# Patient Record
Sex: Male | Born: 1935 | Race: White | Hispanic: No | Marital: Married
Health system: Southern US, Community
[De-identification: ages and names within clinical notes are randomized; demographics above are authoritative.]

## PROBLEM LIST (undated history)

## (undated) DIAGNOSIS — I4891 Unspecified atrial fibrillation: Secondary | ICD-10-CM

## (undated) DIAGNOSIS — S2249XA Multiple fractures of ribs, unspecified side, initial encounter for closed fracture: Secondary | ICD-10-CM

## (undated) DIAGNOSIS — R011 Cardiac murmur, unspecified: Secondary | ICD-10-CM

## (undated) DIAGNOSIS — IMO0001 Reserved for inherently not codable concepts without codable children: Secondary | ICD-10-CM

## (undated) DIAGNOSIS — K219 Gastro-esophageal reflux disease without esophagitis: Secondary | ICD-10-CM

## (undated) DIAGNOSIS — R51 Headache: Secondary | ICD-10-CM

## (undated) DIAGNOSIS — K922 Gastrointestinal hemorrhage, unspecified: Secondary | ICD-10-CM

## (undated) DIAGNOSIS — D649 Anemia, unspecified: Secondary | ICD-10-CM

## (undated) DIAGNOSIS — S2239XA Fracture of one rib, unspecified side, initial encounter for closed fracture: Secondary | ICD-10-CM

## (undated) DIAGNOSIS — E7800 Pure hypercholesterolemia, unspecified: Secondary | ICD-10-CM

## (undated) DIAGNOSIS — I219 Acute myocardial infarction, unspecified: Secondary | ICD-10-CM

## (undated) DIAGNOSIS — I209 Angina pectoris, unspecified: Secondary | ICD-10-CM

## (undated) DIAGNOSIS — I2510 Atherosclerotic heart disease of native coronary artery without angina pectoris: Secondary | ICD-10-CM

## (undated) DIAGNOSIS — J189 Pneumonia, unspecified organism: Secondary | ICD-10-CM

## (undated) DIAGNOSIS — I5189 Other ill-defined heart diseases: Secondary | ICD-10-CM

## (undated) DIAGNOSIS — N189 Chronic kidney disease, unspecified: Secondary | ICD-10-CM

## (undated) DIAGNOSIS — Z5189 Encounter for other specified aftercare: Secondary | ICD-10-CM

## (undated) DIAGNOSIS — K274 Chronic or unspecified peptic ulcer, site unspecified, with hemorrhage: Secondary | ICD-10-CM

## (undated) DIAGNOSIS — I499 Cardiac arrhythmia, unspecified: Secondary | ICD-10-CM

## (undated) DIAGNOSIS — S329XXA Fracture of unspecified parts of lumbosacral spine and pelvis, initial encounter for closed fracture: Secondary | ICD-10-CM

## (undated) DIAGNOSIS — E039 Hypothyroidism, unspecified: Secondary | ICD-10-CM

## (undated) DIAGNOSIS — Z889 Allergy status to unspecified drugs, medicaments and biological substances status: Secondary | ICD-10-CM

## (undated) DIAGNOSIS — M1990 Unspecified osteoarthritis, unspecified site: Secondary | ICD-10-CM

## (undated) DIAGNOSIS — I739 Peripheral vascular disease, unspecified: Secondary | ICD-10-CM

## (undated) DIAGNOSIS — I10 Essential (primary) hypertension: Secondary | ICD-10-CM

## (undated) HISTORY — DX: Unspecified atrial fibrillation: I48.91

## (undated) HISTORY — PX: HERNIA REPAIR: SHX51

## (undated) HISTORY — DX: Chronic or unspecified peptic ulcer, site unspecified, with hemorrhage: K27.4

## (undated) HISTORY — DX: Gastrointestinal hemorrhage, unspecified: K92.2

## (undated) HISTORY — PX: EYE SURGERY: SHX253

## (undated) HISTORY — DX: Other ill-defined heart diseases: I51.89

## (undated) HISTORY — PX: ROTATOR CUFF REPAIR: SHX139

## (undated) HISTORY — PX: KNEE SURGERY: SHX244

---

## 2003-04-04 ENCOUNTER — Encounter: Payer: Self-pay | Admitting: Family Medicine

## 2003-04-04 ENCOUNTER — Encounter: Admission: RE | Admit: 2003-04-04 | Discharge: 2003-04-04 | Payer: Self-pay | Admitting: Family Medicine

## 2003-08-11 HISTORY — PX: CORONARY ARTERY BYPASS GRAFT: SHX141

## 2003-08-13 ENCOUNTER — Inpatient Hospital Stay (HOSPITAL_COMMUNITY): Admission: EM | Admit: 2003-08-13 | Discharge: 2003-08-21 | Payer: Self-pay | Admitting: Emergency Medicine

## 2003-09-10 ENCOUNTER — Encounter (HOSPITAL_COMMUNITY): Admission: RE | Admit: 2003-09-10 | Discharge: 2003-12-09 | Payer: Self-pay | Admitting: Cardiovascular Disease

## 2003-12-10 ENCOUNTER — Encounter (HOSPITAL_COMMUNITY): Admission: RE | Admit: 2003-12-10 | Discharge: 2003-12-11 | Payer: Self-pay | Admitting: Cardiovascular Disease

## 2005-01-14 ENCOUNTER — Ambulatory Visit (HOSPITAL_COMMUNITY): Admission: RE | Admit: 2005-01-14 | Discharge: 2005-01-14 | Payer: Self-pay | Admitting: Orthopaedic Surgery

## 2005-02-02 ENCOUNTER — Ambulatory Visit (HOSPITAL_COMMUNITY): Admission: RE | Admit: 2005-02-02 | Discharge: 2005-02-02 | Payer: Self-pay | Admitting: Orthopaedic Surgery

## 2005-08-20 ENCOUNTER — Ambulatory Visit (HOSPITAL_COMMUNITY): Admission: RE | Admit: 2005-08-20 | Discharge: 2005-08-20 | Payer: Self-pay | Admitting: Ophthalmology

## 2005-08-31 ENCOUNTER — Ambulatory Visit (HOSPITAL_COMMUNITY): Admission: RE | Admit: 2005-08-31 | Discharge: 2005-08-31 | Payer: Self-pay | Admitting: Ophthalmology

## 2005-09-08 ENCOUNTER — Emergency Department (HOSPITAL_COMMUNITY): Admission: EM | Admit: 2005-09-08 | Discharge: 2005-09-09 | Payer: Self-pay | Admitting: Emergency Medicine

## 2006-08-10 HISTORY — PX: OTHER SURGICAL HISTORY: SHX169

## 2007-01-17 ENCOUNTER — Ambulatory Visit: Payer: Self-pay | Admitting: Cardiovascular Disease

## 2007-01-17 ENCOUNTER — Inpatient Hospital Stay (HOSPITAL_COMMUNITY): Admission: EM | Admit: 2007-01-17 | Discharge: 2007-02-01 | Payer: Self-pay | Admitting: Emergency Medicine

## 2007-01-19 ENCOUNTER — Encounter: Payer: Self-pay | Admitting: Cardiovascular Disease

## 2007-01-20 ENCOUNTER — Ambulatory Visit: Payer: Self-pay | Admitting: Surgery

## 2007-01-24 HISTORY — PX: CARDIAC CATHETERIZATION: SHX172

## 2007-01-27 ENCOUNTER — Encounter: Payer: Self-pay | Admitting: Surgery

## 2007-02-15 ENCOUNTER — Ambulatory Visit: Payer: Self-pay | Admitting: Surgery

## 2007-02-17 ENCOUNTER — Encounter (HOSPITAL_COMMUNITY): Admission: RE | Admit: 2007-02-17 | Discharge: 2007-05-18 | Payer: Self-pay | Admitting: Cardiovascular Disease

## 2008-04-02 IMAGING — CR DG CHEST 1V PORT
1 series · 1 of 1 positions shown · non-contrast
Comparison: 01/27/07.

CLINICAL DATA: CABG.
 PORTABLE CHEST - 1 VIEW:

[view not recorded]
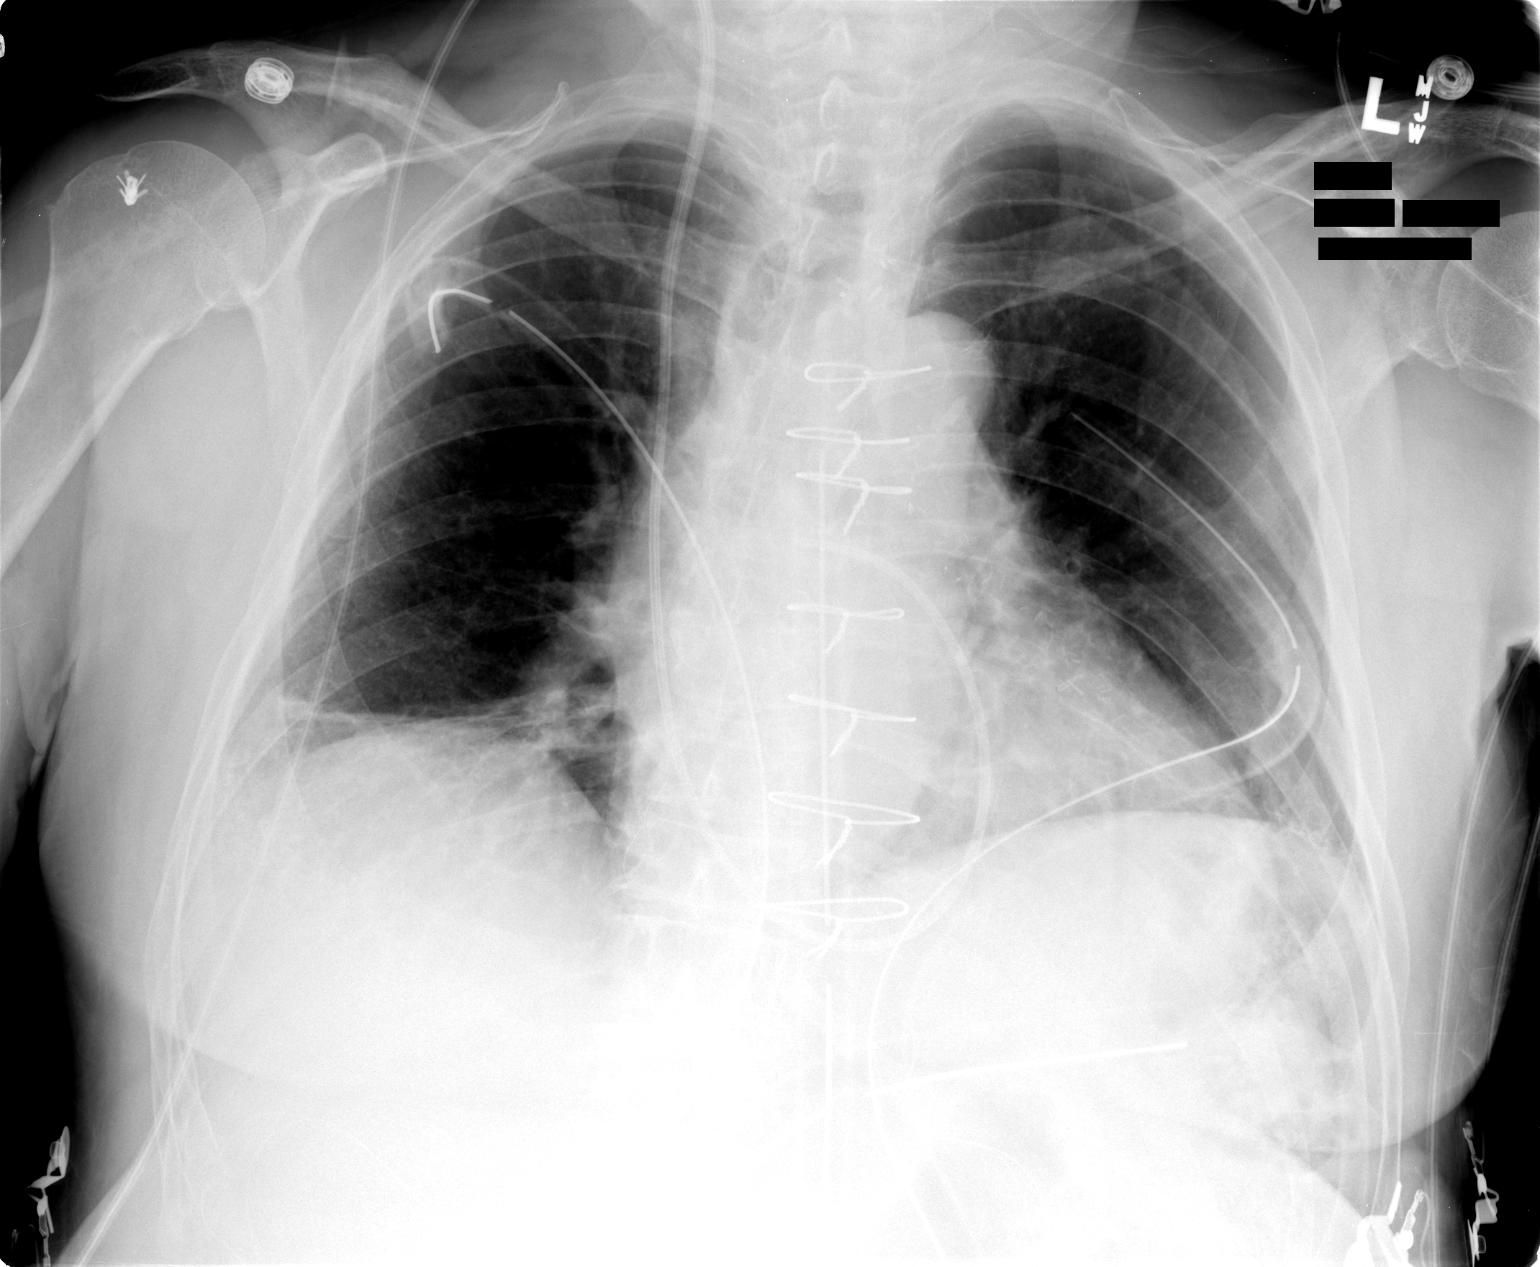

[1 of 1 positions shown; findings below may reference images not displayed]

FINDINGS: The patient has been extubated. Right IJ and Swan-Ganz catheter is in the right pulmonary artery. Bilateral chest tubes and a mediastinal drain remain in place. Heart size stable. Lungs are low in volume with bibasilar atelectasis.
IMPRESSION: Bibasilar atelectasis.

## 2008-04-03 IMAGING — CR DG CHEST 1V PORT
1 series · 1 of 1 positions shown · non-contrast
Comparison: Portable chest x-rays yesterday and 01/27/2007.

CLINICAL DATA: Postop CABG.

PORTABLE CHEST - 1 VIEW  [DATE]/9220 2512 hours:

[view not recorded]
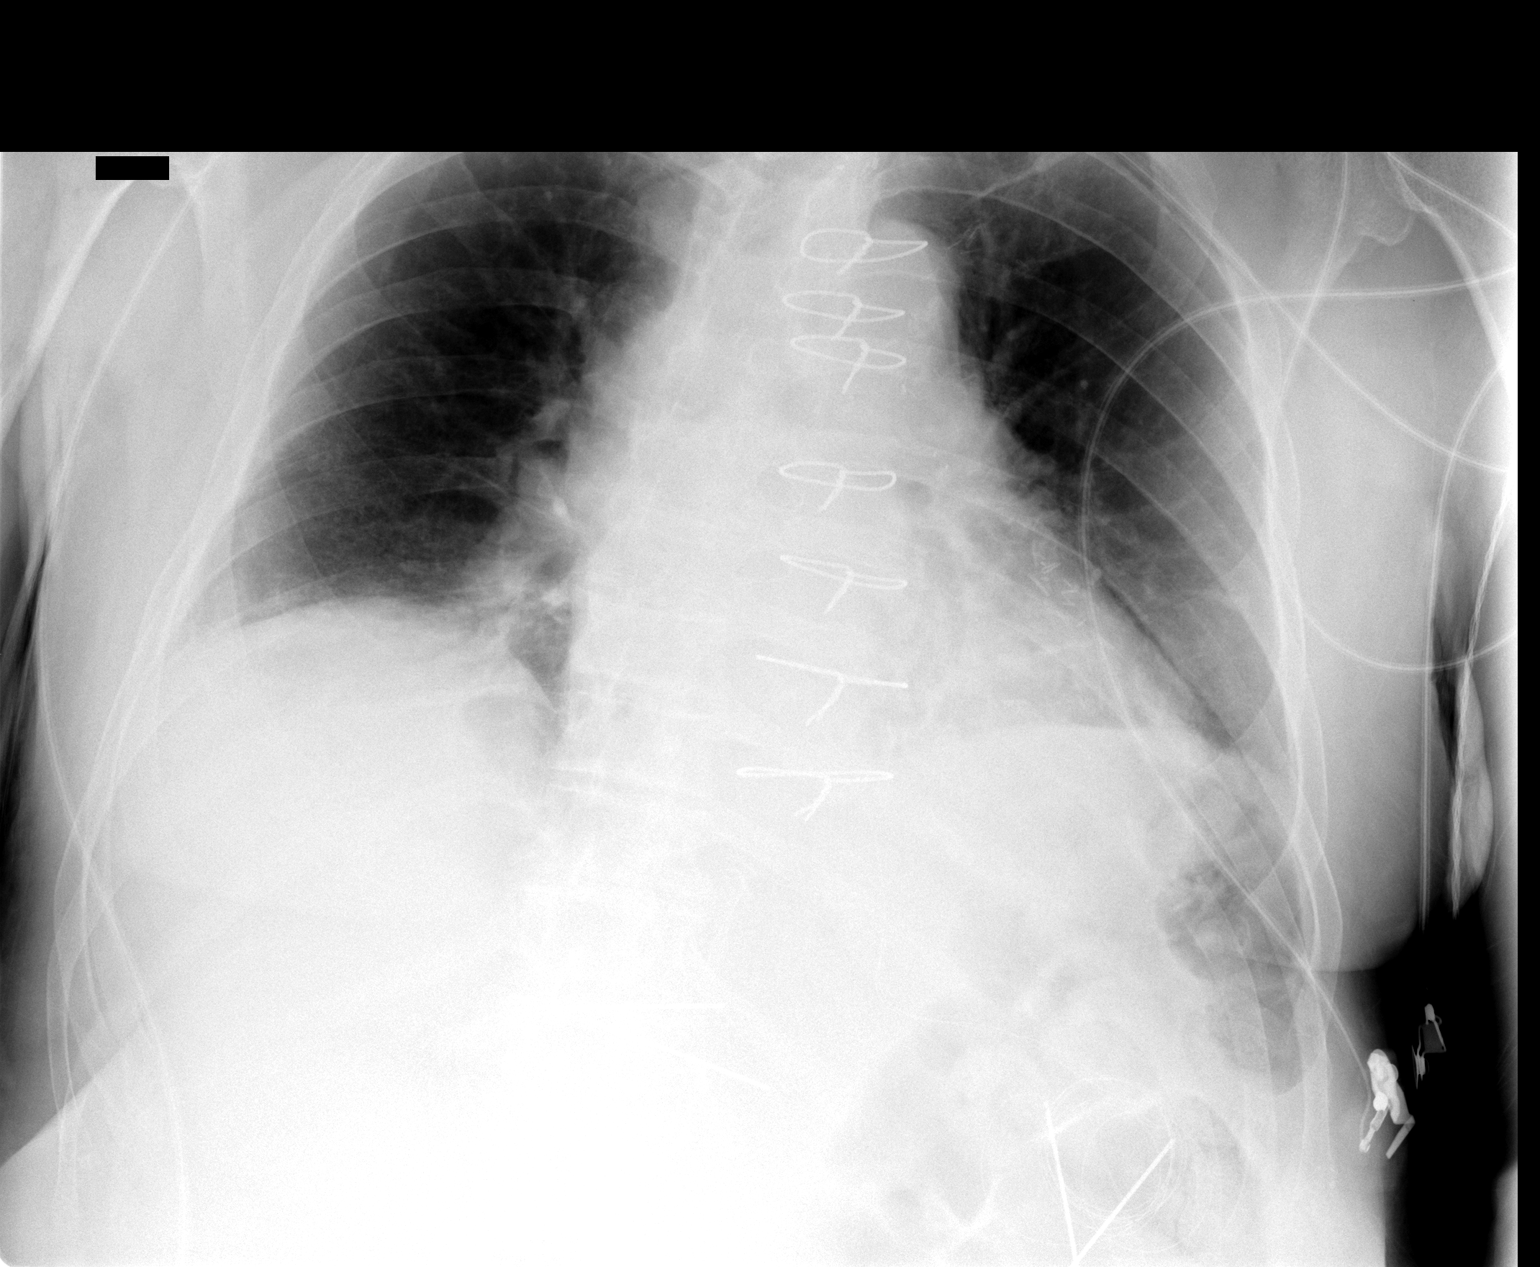

[1 of 1 positions shown; findings below may reference images not displayed]

FINDINGS: Removal of bilateral chest tubes and Swan-Ganz catheter. Extubation.
No pneumothorax. Stable mild bibasilar atelectasis. Lungs otherwise clear. No
evidence of pulmonary edema. Stable small effusions. No new abnormalities.
Sternotomy for CABG. Heart mildly enlarged but stable.
IMPRESSION: No pneumothorax after bilateral chest tube removal. Stable mild bibasilar
atelectasis post-extubation. No new abnormalities.

## 2010-03-28 ENCOUNTER — Inpatient Hospital Stay (HOSPITAL_COMMUNITY): Admission: EM | Admit: 2010-03-28 | Discharge: 2010-03-29 | Payer: Self-pay | Admitting: Emergency Medicine

## 2010-03-31 ENCOUNTER — Ambulatory Visit: Payer: Self-pay | Admitting: Cardiovascular Disease

## 2010-04-04 ENCOUNTER — Ambulatory Visit: Payer: Self-pay | Admitting: Cardiovascular Disease

## 2010-04-15 ENCOUNTER — Ambulatory Visit: Payer: Self-pay | Admitting: Diagnostic Radiology

## 2010-04-15 ENCOUNTER — Emergency Department (HOSPITAL_BASED_OUTPATIENT_CLINIC_OR_DEPARTMENT_OTHER)
Admission: EM | Admit: 2010-04-15 | Discharge: 2010-04-15 | Payer: Self-pay | Source: Home / Self Care | Admitting: Emergency Medicine

## 2010-04-16 ENCOUNTER — Encounter (INDEPENDENT_AMBULATORY_CARE_PROVIDER_SITE_OTHER): Payer: Self-pay | Admitting: Emergency Medicine

## 2010-04-16 ENCOUNTER — Ambulatory Visit: Payer: Self-pay | Admitting: Vascular Surgery

## 2010-04-16 ENCOUNTER — Ambulatory Visit (HOSPITAL_COMMUNITY)
Admission: RE | Admit: 2010-04-16 | Discharge: 2010-04-16 | Payer: Self-pay | Source: Home / Self Care | Admitting: Emergency Medicine

## 2010-04-18 ENCOUNTER — Ambulatory Visit: Payer: Self-pay | Admitting: Cardiovascular Disease

## 2010-04-25 ENCOUNTER — Ambulatory Visit: Payer: Self-pay | Admitting: Cardiovascular Disease

## 2010-04-30 ENCOUNTER — Ambulatory Visit: Payer: Self-pay | Admitting: Cardiology

## 2010-05-14 ENCOUNTER — Ambulatory Visit: Payer: Self-pay | Admitting: Cardiovascular Disease

## 2010-06-09 ENCOUNTER — Ambulatory Visit: Payer: Self-pay | Admitting: Cardiovascular Disease

## 2010-07-07 ENCOUNTER — Ambulatory Visit: Payer: Self-pay | Admitting: Cardiology

## 2010-07-16 ENCOUNTER — Ambulatory Visit: Payer: Self-pay | Admitting: Cardiovascular Disease

## 2010-07-18 ENCOUNTER — Ambulatory Visit: Payer: Self-pay | Admitting: Cardiovascular Disease

## 2010-07-28 ENCOUNTER — Encounter: Payer: Self-pay | Admitting: Cardiovascular Disease

## 2010-07-28 ENCOUNTER — Ambulatory Visit: Payer: Self-pay

## 2010-07-28 ENCOUNTER — Ambulatory Visit (HOSPITAL_COMMUNITY)
Admission: RE | Admit: 2010-07-28 | Discharge: 2010-07-28 | Payer: Self-pay | Source: Home / Self Care | Attending: Cardiovascular Disease | Admitting: Cardiovascular Disease

## 2010-07-28 HISTORY — PX: TRANSTHORACIC ECHOCARDIOGRAM: SHX275

## 2010-08-01 ENCOUNTER — Ambulatory Visit: Payer: Self-pay | Admitting: Surgery

## 2010-08-20 ENCOUNTER — Ambulatory Visit: Payer: Self-pay | Admitting: Cardiology

## 2010-09-12 ENCOUNTER — Other Ambulatory Visit (INDEPENDENT_AMBULATORY_CARE_PROVIDER_SITE_OTHER): Payer: Medicare Other

## 2010-09-12 DIAGNOSIS — Z7901 Long term (current) use of anticoagulants: Secondary | ICD-10-CM

## 2010-10-13 ENCOUNTER — Encounter (INDEPENDENT_AMBULATORY_CARE_PROVIDER_SITE_OTHER): Payer: Medicare Other

## 2010-10-13 DIAGNOSIS — Z7901 Long term (current) use of anticoagulants: Secondary | ICD-10-CM

## 2010-10-21 ENCOUNTER — Ambulatory Visit (INDEPENDENT_AMBULATORY_CARE_PROVIDER_SITE_OTHER): Payer: Medicare Other | Admitting: Cardiovascular Disease

## 2010-10-21 DIAGNOSIS — I2510 Atherosclerotic heart disease of native coronary artery without angina pectoris: Secondary | ICD-10-CM

## 2010-10-21 DIAGNOSIS — E7800 Pure hypercholesterolemia, unspecified: Secondary | ICD-10-CM

## 2010-10-21 DIAGNOSIS — Z951 Presence of aortocoronary bypass graft: Secondary | ICD-10-CM

## 2010-10-21 DIAGNOSIS — I4891 Unspecified atrial fibrillation: Secondary | ICD-10-CM

## 2010-10-23 LAB — PROTIME-INR
INR: 1 (ref 0.00–1.49)
INR: 1.05 (ref 0.00–1.49)
INR: 4.36 — ABNORMAL HIGH (ref 0.00–1.49)
Prothrombin Time: 13.2 seconds (ref 11.6–15.2)
Prothrombin Time: 13.4 seconds (ref 11.6–15.2)
Prothrombin Time: 13.9 seconds (ref 11.6–15.2)
Prothrombin Time: 41.6 seconds — ABNORMAL HIGH (ref 11.6–15.2)

## 2010-10-23 LAB — CBC
HCT: 30.3 % — ABNORMAL LOW (ref 39.0–52.0)
HCT: 33.2 % — ABNORMAL LOW (ref 39.0–52.0)
Hemoglobin: 10.3 g/dL — ABNORMAL LOW (ref 13.0–17.0)
Hemoglobin: 11.4 g/dL — ABNORMAL LOW (ref 13.0–17.0)
Hemoglobin: 9.6 g/dL — ABNORMAL LOW (ref 13.0–17.0)
Hemoglobin: 9.9 g/dL — ABNORMAL LOW (ref 13.0–17.0)
MCH: 30.2 pg (ref 26.0–34.0)
MCH: 30.2 pg (ref 26.0–34.0)
MCHC: 34 g/dL (ref 30.0–36.0)
MCHC: 34.3 g/dL (ref 30.0–36.0)
MCHC: 34.4 g/dL (ref 30.0–36.0)
MCV: 87.8 fL (ref 78.0–100.0)
MCV: 88.9 fL (ref 78.0–100.0)
Platelets: 169 10*3/uL (ref 150–400)
RBC: 3.12 MIL/uL — ABNORMAL LOW (ref 4.22–5.81)
RBC: 3.78 MIL/uL — ABNORMAL LOW (ref 4.22–5.81)
RDW: 12.7 % (ref 11.5–15.5)
RDW: 12.8 % (ref 11.5–15.5)
RDW: 12.8 % (ref 11.5–15.5)
WBC: 4.6 10*3/uL (ref 4.0–10.5)
WBC: 8.3 10*3/uL (ref 4.0–10.5)

## 2010-10-23 LAB — GLUCOSE, CAPILLARY
Glucose-Capillary: 124 mg/dL — ABNORMAL HIGH (ref 70–99)
Glucose-Capillary: 126 mg/dL — ABNORMAL HIGH (ref 70–99)
Glucose-Capillary: 143 mg/dL — ABNORMAL HIGH (ref 70–99)
Glucose-Capillary: 87 mg/dL (ref 70–99)

## 2010-10-23 LAB — BASIC METABOLIC PANEL
BUN: 27 mg/dL — ABNORMAL HIGH (ref 6–23)
CO2: 21 mEq/L (ref 19–32)
CO2: 25 mEq/L (ref 19–32)
Calcium: 8.4 mg/dL (ref 8.4–10.5)
Calcium: 8.6 mg/dL (ref 8.4–10.5)
Chloride: 101 mEq/L (ref 96–112)
Creatinine, Ser: 1.82 mg/dL — ABNORMAL HIGH (ref 0.4–1.5)
GFR calc Af Amer: 43 mL/min — ABNORMAL LOW (ref >60)
GFR calc Af Amer: 55 mL/min — ABNORMAL LOW (ref >60)
GFR calc non Af Amer: 35 mL/min — ABNORMAL LOW (ref >60)
GFR calc non Af Amer: 45 mL/min — ABNORMAL LOW (ref >60)
Glucose, Bld: 255 mg/dL — ABNORMAL HIGH (ref 70–99)
Potassium: 4.4 mEq/L (ref 3.5–5.1)
Potassium: 4.8 mEq/L (ref 3.5–5.1)
Sodium: 129 mEq/L — ABNORMAL LOW (ref 135–145)
Sodium: 131 mEq/L — ABNORMAL LOW (ref 135–145)

## 2010-10-23 LAB — DIFFERENTIAL
Basophils Absolute: 0 10*3/uL (ref 0.0–0.1)
Basophils Relative: 0 % (ref 0–1)
Eosinophils Absolute: 0 10*3/uL (ref 0.0–0.7)
Eosinophils Relative: 0 % (ref 0–5)
Lymphocytes Relative: 10 % — ABNORMAL LOW (ref 12–46)
Lymphs Abs: 0.5 10*3/uL — ABNORMAL LOW (ref 0.7–4.0)
Monocytes Absolute: 0.1 10*3/uL (ref 0.1–1.0)
Monocytes Relative: 1 % — ABNORMAL LOW (ref 3–12)
Neutro Abs: 4.1 10*3/uL (ref 1.7–7.7)
Neutrophils Relative %: 89 % — ABNORMAL HIGH (ref 43–77)

## 2010-10-23 LAB — URINALYSIS, ROUTINE W REFLEX MICROSCOPIC
Glucose, UA: 250 mg/dL — AB
Ketones, ur: NEGATIVE mg/dL
Leukocytes, UA: NEGATIVE
Nitrite: NEGATIVE
Protein, ur: NEGATIVE mg/dL
pH: 5.5 (ref 5.0–8.0)

## 2010-10-23 LAB — COMPREHENSIVE METABOLIC PANEL
ALT: 26 U/L (ref 0–53)
BUN: 25 mg/dL — ABNORMAL HIGH (ref 6–23)
CO2: 23 mEq/L (ref 19–32)
Calcium: 8.7 mg/dL (ref 8.4–10.5)
Creatinine, Ser: 1.67 mg/dL — ABNORMAL HIGH (ref 0.4–1.5)
GFR calc non Af Amer: 39 mL/min — ABNORMAL LOW (ref >60)
Glucose, Bld: 138 mg/dL — ABNORMAL HIGH (ref 70–99)
Sodium: 134 mEq/L — ABNORMAL LOW (ref 135–145)
Total Protein: 5.4 g/dL — ABNORMAL LOW (ref 6.0–8.3)

## 2010-10-23 LAB — POCT CARDIAC MARKERS
CKMB, poc: 1.2 ng/mL (ref 1.0–8.0)
Myoglobin, poc: 92.6 ng/mL (ref 12–200)
Troponin i, poc: <0.05 ng/mL (ref 0.00–0.09)

## 2010-10-23 LAB — CARDIAC PANEL(CRET KIN+CKTOT+MB+TROPI)
Total CK: 93 U/L (ref 7–232)
Troponin I: 0.02 ng/mL (ref 0.00–0.06)
Troponin I: 0.02 ng/mL (ref 0.00–0.06)

## 2010-10-23 LAB — URINE MICROSCOPIC-ADD ON

## 2010-10-23 LAB — TROPONIN I: Troponin I: 0.01 ng/mL (ref 0.00–0.06)

## 2010-10-23 LAB — URINE CULTURE
Colony Count: NO GROWTH
Culture  Setup Time: 201108182256

## 2010-10-23 LAB — TSH: TSH: 0.981 u[IU]/mL (ref 0.350–4.500)

## 2010-10-23 LAB — APTT: aPTT: 29 seconds (ref 24–37)

## 2010-10-23 LAB — CK TOTAL AND CKMB (NOT AT ARMC): CK, MB: 3.1 ng/mL (ref 0.3–4.0)

## 2010-10-23 LAB — MAGNESIUM: Magnesium: 2.5 mg/dL (ref 1.5–2.5)

## 2010-11-10 ENCOUNTER — Ambulatory Visit (INDEPENDENT_AMBULATORY_CARE_PROVIDER_SITE_OTHER): Payer: Medicare Other | Admitting: *Deleted

## 2010-11-10 DIAGNOSIS — Z7901 Long term (current) use of anticoagulants: Secondary | ICD-10-CM

## 2010-11-10 DIAGNOSIS — I4891 Unspecified atrial fibrillation: Secondary | ICD-10-CM | POA: Insufficient documentation

## 2010-11-10 LAB — POCT INR: INR: 1.7

## 2010-11-25 ENCOUNTER — Ambulatory Visit (INDEPENDENT_AMBULATORY_CARE_PROVIDER_SITE_OTHER): Payer: Medicare Other | Admitting: *Deleted

## 2010-11-25 DIAGNOSIS — I4891 Unspecified atrial fibrillation: Secondary | ICD-10-CM

## 2010-12-16 ENCOUNTER — Encounter: Payer: PRIVATE HEALTH INSURANCE | Admitting: *Deleted

## 2010-12-19 ENCOUNTER — Ambulatory Visit (INDEPENDENT_AMBULATORY_CARE_PROVIDER_SITE_OTHER): Payer: Medicare Other | Admitting: *Deleted

## 2010-12-19 DIAGNOSIS — I4891 Unspecified atrial fibrillation: Secondary | ICD-10-CM

## 2010-12-23 NOTE — Procedures (Signed)
DUPLEX DEEP VENOUS EXAM - LOWER EXTREMITY   INDICATION:  Left lower extremity edema.   HISTORY:  Edema:  Left leg.  Trauma/Surgery:  No.  Pain:  No.  PE:  No.  Previous DVT:  No.  Anticoagulants:  No.  Other:  Lasix.   DUPLEX EXAM:                CFV   SFV   PopV  PTV    GSV                R  L  R  L  R  L  R   L  R  L  Thrombosis    o  o     o     o      o     o  Spontaneous   +  +     +     +      +     +  Phasic        +  +     +     +      +     +  Augmentation  +  +     +     +      +     +  Compressible  +  +     +     +      +     +  Competent     o  o     o     +      +     +   Legend:  + - yes  o - no  p - partial  D - decreased   IMPRESSION:  1. Left lower extremity appears negative for deep venous thrombosis.  2. Left lower extremity deep veins are positive for clinically      significant venous insufficiency.    _____________________________  V. Leia Alf, MD   EM/MEDQ  D:  08/01/2010  T:  08/01/2010  Job:  [id number]

## 2010-12-23 NOTE — Discharge Summary (Signed)
James, [name] NO.:  [id number]   MEDICAL RECORD NO.:  [id number]          PATIENT TYPE:  INP   LOCATION:  2015                         FACILITY:  [facility]   PHYSICIAN:  Gilford Raid, M.D.     DATE OF BIRTH:  [birth year]   DATE OF ADMISSION:  01/17/2007  DATE OF DISCHARGE:  02/01/2007                               DISCHARGE SUMMARY   PRIMARY ADMITTING DIAGNOSIS:  1. Left atrial mass.  2. Single vessel coronary artery disease.   ADDITIONAL/DISCHARGE DIAGNOSES:  1. 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  2. Single vessel coronary artery disease, recurrent, status post CABG      in 2005.  3. Atrial fibrillation.  4. Hypertension.  5. Hyperlipidemia.  6. Hypothyroidism.  7. Postoperative blood loss anemia.   PROCEDURES PERFORMED.:  1. Cardiac catheterization.  2. Redo median sternotomy with redo coronary artery bypass grafting      times one (saphenous vein graft to the obtuse marginal).  3. Resection of left atrial tumor.  4. Endoscopic vein harvest right leg.   HISTORY OF PRESENT ILLNESS:  The patient is an 77 year old male who is  status post coronary artery bypass grafting in 2005 by Dr. Cyndia Bent.  Since spring, he has been having some problems with his heart rhythm.  Initially was noted to have a slow heart rate in the 40s and later had  some tachycardia and palpitations.  He subsequently sought care by Dr.  Acie Fredrickson and underwent an EKG which showed an old anterior septal infarct  with incomplete right bundle branch block and normal sinus rhythm.  The  2-D echocardiogram showed a left atrial mass suspicious for thrombus  measuring about three x 2 cm.  There was mild mitral regurgitation and  moderate aortic insufficiency with a moderate degree of left atrial  enlargement.  He was also noted to be in atrial fibrillation.  He was  admitted to [facility] on January 17, 2007, for IV heparin and  consideration of Coumadin therapy and to  proceed with further workup.   HOSPITAL COURSE:  On admission, he was noted to have evidence of  hypothyroidism with TSH of 7.22 and was started on low-dose Synthroid.  He underwent a transesophageal echocardiogram which confirmed a 2 x 3 cm  mass in the left atrium which appeared to be attached to the atrial  septum.  He subsequently underwent an MRI which showed no evidence of  thrombosis in the mass.  He then underwent cardiac catheterization which  showed a patent LIMA to the LAD and saphenous vein graft to the  diagonal.  The saphenous vein graft to the obtuse marginal was occluded.  The LAD was patent with proximal significant stenosis.  There was  competitive flow in the LAD from the LIMA graft.  The left circumflex  had a 60-70% ostial stenosis.  There was a single marginal branch.  The  right coronary was a large hepatic vessel with diffuse irregularity, but  no significant stenosis.  Left ventricular function appeared well-  preserved.  A cardiac surgery consultation was obtained, and the patient  was  seen by Dr. Cyndia Bent who agreed that he would benefit from redo CABG  to the obtuse marginal branch and resection of the left atrial mass at  this time.  He explained the risks, benefits and alternatives of the  procedure to the patient and his family, and he agreed to proceed with  surgery.  He was taken to the operating room on January 27, 2007, and  underwent CABG times one with resection of the left atrial mass as  described in detail above performed by Dr. Cyndia Bent.  He tolerated the  procedure well and was transferred to the ICU in stable condition.  Postoperatively, he has done fairly well.  He developed atrial  fibrillation again postoperatively, and was started on IV amiodarone  with conversion to normal sinus rhythm.  He was started on low-dose  Coumadin.  He also was noted to have a mild blood loss anemia and was  started on iron supplements.  He remained in the unit for  further  observation, and by postop day #3, was ready for transfer to the floor.  He had one brief episode after his transfer of atrial fibrillation and  quickly converted back to normal sinus rhythm.  His other watch remained  afebrile and vital signs have been stable.  He has been diuresed back  down to his preoperative weight with only trace edema on physical exam.  He is ambulating well with cardiac rehab phase one and independently.  His incisions are healing well.  He is currently being anticoagulated  and his PT is 15.6 and INR of 1.2 on January 31, 2007.  He has been  switched to p.o. amiodarone.  His other recent labs show hemoglobin of  8.4, hematocrit 24.9, white count 10.3, platelets 172.  Sodium 130,  potassium 4.1, BUN 20, creatinine 1.22.  He will be observed closely  over the next 24 hours, and if he continues to remain stable and  progresses well, he will hopefully be ready for discharge home by February 01, 2007, pending morning round evaluation.   DISCHARGE MEDICATIONS:  1. Coumadin home dose will be determined by PT and INR drawn on the      day of discharge.  2. Aspirin 81 mg daily.  3. Lopressor 25 mg b.i.d.  4. Zocor 20 mg q.h.s.  5. Synthroid 25 mcg daily.  6. Amiodarone 400 mg b.i.d. x 1 week, then 200 mg b.i.d.  7. Famotidine 400 mg q.h.s.  8. Diazepam 5 mg q.h.s.  9. Mucinex p.r.n.  10.Tylox one to two q.4 h p.r.n. for pain.   DISCHARGE INSTRUCTIONS:  He is asked to refrain from driving, heavy  lifting or strenuous activity.  He may continue ambulating daily and  using his incentive spirometer.  He may shower daily and clean his  incisions with soap and water.  He will continue low-fat, low-sodium  diet.   DISCHARGE FOLLOWUP:  He will see Dr. Acie Fredrickson back in the office in 2  weeks.  He will have chest x-ray at that visit.  He will need to have a  PT and INR drawn within 48 hours of discharge by Dr. Elmarie Shiley office for  management of his Coumadin.  He will see  Dr. Cyndia Bent on February 15, 2007, at  10:00 a.m.  In the interim, if he experiences any problems or has  questions, he is asked to contact our office.      Suzzanne Cloud, P.A.      Gilford Raid, M.D.  Electronically Signed    GC/MEDQ  D:  01/31/2007  T:  01/31/2007  Job:  [id number]   cc:   Thayer Headings, M.D.  PCTS office

## 2010-12-23 NOTE — Assessment & Plan Note (Signed)
OFFICE VISIT   HASTINGS, BUERKLE  DOB:  1920-04-26                                        February 15, 2007  CHART #:  [id number]   OFFICE VISIT   The patient returns today for followup status post redo coronary artery  bypass grafting surgery x1 with a saphenous vein graft to the obtuse  marginal branch, resection of a left atrial tumor, and endoscopic vein  harvesting from his right leg.  The final pathology showed a cardiac  myxoma with the resection margins being negative for tumor.  He had some  tachy palpitations preoperatively as well as some atrial fibrillation  postoperatively and was started on amiodarone and Coumadin.  He  maintains sinus rhythm following that.  He has been doing fairly well at  home.  He does report having some indigestion symptoms at times, and  still some chest wall soreness.  His energy level is slowly improving.  He is walking 3 times per day and is scheduled to be in cardiac rehab  next week.  He has an appointment tomorrow to see Dr. Acie Fredrickson.   PHYSICAL EXAMINATION:  Blood pressure 120/69, pulse 64 and regular,  respiratory rate is 18 and unlabored.  Oxygen saturation is 97% on room  air.  He looks well.  Cardiac:  Regular rate and rhythm with normal  heart sounds.  Lungs:  Crackles in both bases.  Chest:  Incision is  healing well and the sternum is stable.  Extremities:  His right leg  incision is healing well.  There is a small subcutaneous hematoma  beneath the vein harvest incision, adjacent to the right knee.  There  are no signs of infection.   MEDICATIONS:  1. Coumadin 5 mg daily.  2. Synthroid 25 mcg daily.  3. Amiodarone 200 mg b.i.d.  4. Famotidine 400 mg nightly.  5. Valium 5 mg nightly.  6. Lopressor 25 mg b.i.d.  7. Lipitor 10 mg daily.  8. Aspirin 81 mg daily.  9. Tylox p.r.n. for pain.   Overall, the patient is recovering well following redo cardiac surgery,  especially considering his age.  I  encouraged him to continue walking as  much as possible and to use his incentive spirometer at home.  I think  he should be fine to start cardiac rehab next week.  I told him he can  return to driving a car when he feels comfortable with that, and should  refrain from lifting anything heavier than 10 pounds for a total of 3  months from the date of surgery.  He will follow up with Dr. Acie Fredrickson  tomorrow.  Dr. Acie Fredrickson will ultimately decide how long he needs to remain  on amiodarone and Coumadin.  He did have some problems with tachy  palpitations preoperatively, which prompted his initial presentation and  he may need to remain on amiodarone and Coumadin long-term.  He will  return to see me if he develops any problems with his incisions.   Gilford Raid, M.D.  Electronically Signed   BB/MEDQ  D:  02/15/2007  T:  02/15/2007  Job:  [id number]   cc:   Thayer Headings, M.D.

## 2010-12-23 NOTE — Procedures (Signed)
CAROTID DUPLEX EXAM   INDICATION:  Bruit.   HISTORY:  Diabetes:  No.  Cardiac:  Triple bypass, atrial fibrillation.  Hypertension:  No.  Smoking:  No.  Previous Surgery:  No.  CV History:  No.  Amaurosis Fugax No, Paresthesias No, Hemiparesis No                                       RIGHT             LEFT  Brachial systolic pressure:         110               112  Brachial Doppler waveforms:         Normal            Normal  Vertebral direction of flow:        Antegrade         Antegrade  DUPLEX VELOCITIES (cm/sec)  CCA peak systolic                   80                81  ECA peak systolic                   134               [id number]  ICA peak systolic                   119               [id number]  ICA end diastolic                   33                45  PLAQUE MORPHOLOGY:                  Heterogeneous     Heterogeneous  PLAQUE AMOUNT:                      Minimal           Moderate  PLAQUE LOCATION:                    ICA, ECA          ICA, ECA   IMPRESSION:  1. Right internal carotid artery velocity suggests 1%-39% stenosis.  2. Left internal carotid artery velocity suggests 40%-59% stenosis.      ___________________________________________  V. Leia Alf, MD   EM/MEDQ  D:  08/01/2010  T:  08/01/2010  Job:  [id number]

## 2010-12-23 NOTE — Consult Note (Signed)
[name], [name] NO.:  [id number]   MEDICAL RECORD NO.:  [id number]          PATIENT TYPE:  INP   LOCATION:  4738                         FACILITY:  [facility]   PHYSICIAN:  Gilford Raid, M.D.     DATE OF BIRTH:  1920/05/19   DATE OF CONSULTATION:  01/20/2007  DATE OF DISCHARGE:                                 CONSULTATION   REFERRING PHYSICIAN:  Dr. Mertie Moores.   REASON FOR CONSULTATION:  Left atrial mass.   CLINICAL HISTORY:  I was asked to evaluate [name] for surgical  treatment of a new left atrial mass.  He is a 77 year old gentleman who  underwent coronary bypass graft surgery x3 by me in 2005.  He has done  well postoperatively.  This spring he was noted to have a slow heart  rate in the 40s by a nurse at his church and he was told that this was  abnormal and he should follow up with his physician.  Apparently, he was  on Lopressor at the time.  He said that the Lopressor was discontinued  and he was switched over to another drug which he thinks is carvedilol.  He said that his heart rate increased some, but was still low and  therefore his carvedilol was stopped.  After this was discontinued, he  started having tachy-palpitations.  He was referred to Dr. Acie Fredrickson for  further evaluation.  Electrocardiogram showed normal sinus rhythm with  incomplete right bundle branch block and an old anteroseptal MI.  He  underwent a 2-D echocardiogram which showed a left atrial mass  suspicious for a thrombus; this measured about 3 x 2 cm.  There is mild  mitral regurgitation and moderate aortic insufficiency.  There is  moderate left atrial enlargement.  He was also noted at that time to be  in atrial fibrillation with a rate of 100-115.  She was admitted on January 17, 2007 for intravenous heparin and consideration of Coumadin therapy.  He underwent a transesophageal echocardiogram on January 19, 2007 which  showed a 2 x 3-cm mass in the left atrium that appeared  to be attached  to the atrial septum; this was well circumscribed and slightly mobile.  There was mild mitral regurgitation and moderate aortic insufficiency  according to the report.  I felt that the aortic insufficiency was not  that impressive.  His left ventricular function was at the lower limit  of normal.  He subsequently underwent an MRI, which I have not reviewed,  but reportedly showed that this was not thrombus, but more likely a  tumor.   REVIEW OF SYSTEM:  GENERAL:  He denies any fever or chills.  He has had  no recent weight changes.  He denies fatigue and has been very active.  EYES:  He has had some problems with cataracts and is supposed to have  surgery for this.  ENT:  Negative.  ENDOCRINE:  He denies diabetes.  He  was diagnosed with hypothyroidism this admission with a TSH of 7.2.  CARDIOVASCULAR:  He denies any chest pain or pressure.  He denies  exertional dyspnea.  He said that he recently had mild shortness of  breath when lying down in certain positions; this resolved without  treatment.  He denies peripheral edema.  He has had tachy-palpitations  and newly-diagnosed atrial fibrillation.  RESPIRATORY:  He denies cough  and sputum production.  GI:  He has had no nausea or vomiting.  He  denies melena and bright red blood per rectum.  GU:  He denies dysuria  and hematuria.  NEUROLOGICAL:  He has had no focal weakness or numbness.  He denies dizziness and syncope.  He has never had a TIA or a stroke.  VASCULAR:  He denies claudication and phlebitis.  PSYCHIATRIC:  Negative.  HEMATOLOGICAL:  Negative.   ALLERGIES:  PENICILLIN.   PAST MEDICAL HISTORY:  Significant for hypertension and hyperlipidemia.  He has a history of coronary artery disease and is status post coronary  bypass in 2005.  He had a history of postoperative atrial fibrillation  and now paroxysmal atrial fibrillation.  He does have a history of  cataracts.   SOCIAL HISTORY:  He is retired and  lives with his wife.  He remains  active, mowing his grass and taking care of his house.  He is a  nonsmoker.   FAMILY HISTORY:  Positive for cardiac disease.   PHYSICAL EXAMINATION:  VITAL SIGNS:  His blood pressure is 125/59 and  his pulse is 68 and regular.  Respiratory rate is 18 and unlabored.  He  is a well-developed elderly white male who appears younger than his  stated age.  HEENT:  Exam shows him to be normocephalic and atraumatic.  Pupils are  equal and reactive to light and accommodation.  Extraocular muscles are  intact.  Throat is clear.  NECK:  Exam shows normal carotid pulses bilaterally.  There are no  bruits.  There is no adenopathy or thyromegaly.  CARDIAC:  Exam shows a  regular rate and rhythm with normal S1 and S2.  There is no murmur, rub  or gallop.  LUNGS:  Clear.  There is an old sternotomy incision that is well-healed.  Sternum is stable.  ABDOMEN:  Exam shows active bowel sounds.  His abdomen is soft, flat and  nontender.  There are no palpable masses or organomegaly.  EXTREMITIES:  Exam shows 1 to 2+ pitting edema bilaterally, worse on the  left than the right.  There is an old incision adjacent to the left knee  from vein harvest.  He has bilateral lower extremity varicose veins.  Pedal pulses are palpable bilaterally.  SKIN:  Warm and dry.  NEUROLOGIC:  Exam shows him to be alert and oriented x3.  Motor and  sensory exams are grossly normal.   LABORATORY EXAMINATION:  Reveals normal electrolytes with a BUN of 12  and a creatinine 1.35.  His white blood cell count was 8.4 with a  hemoglobin of 12, hematocrit 36.2, platelet count 195,000.  TSH was  7.22, which is high, and BNP was 522, which is elevated.   Chest x-ray shows cardiomegaly with bilateral peripheral reticulonodular  interstitial opacities in both lung bases.  The costophrenic angle are  blunted, suggesting small bilateral pleural effusions.  IMPRESSION:  [name] has a 3 x 2-cm  well-circumscribed slightly  mobile mass in the left atrium attached to the interatrial septum that  looks like a benign cardiac tumor.  He also has paroxysmal atrial  fibrillation since being on beta blocker that may be related to this.  He has no other symptoms.  I agree that surgical removal of this tumor  is the best treatment to prevent progressive growth that may compromise  flow through the left atrium or mitral valve function.  It is also  possible he could embolize from this tumor.  He will require cardiac  catheterization to reassess his coronary arteries and grafts.  If this  is performed early next week, then I could plan to proceed with surgery  next Thursday morning.  He may continued to have atrial fibrillation  postoperatively and this will need to be treated medically with  antiarrhythmic therapy and Coumadin.  I would plan to ligate his left  atrial appendage at the time of surgery.  I would not plan to perform a  maze procedure in this 77 year old gentleman with redo heart surgery and  newly-diagnosed paroxysmal atrial fibrillation.  I discussed the  operative procedure with him and his wife and 2 daughters.  I discussed  alternatives, benefits, and risks including, but not limited to  bleeding, blood transfusion, infection, stroke, myocardial infarction,  graft failure, organ failure, and death.  I also discussed the small  possibility that this tumor could recur, even it is completely resected.  They understand all this and agree to proceed.      Gilford Raid, M.D.  Electronically Signed    BB/MEDQ  D:  01/20/2007  T:  01/21/2007  Job:  [id number]   cc:   Thayer Headings, M.D.

## 2010-12-23 NOTE — H&P (Signed)
NAMECONRAD, [name] NO.:  [id number]   MEDICAL RECORD NO.:  [id number]          PATIENT TYPE:  INP   LOCATION:  4738                         FACILITY:  [facility]   PHYSICIAN:  Thayer Headings, M.D. DATE OF BIRTH:  12-Dec-1919   DATE OF ADMISSION:  01/17/2007  DATE OF DISCHARGE:                              HISTORY & PHYSICAL   HISTORY OF PRESENT ILLNESS:   [name] is an elderly gentleman with a history of sick sinus  syndrome coronary artery disease, hyperlipidemia.  He is admitted to the  hospital today after being found to have a large intraatrial mass  consistent with either thrombus or perhaps a primary cardiac tumor.  He  is admitted now for anticoagulation.   [name] was seen in the office 3 days ago.  He had been having  some episodes of tachy palpitations.  He took an extra Toprol and his  tachy palpitations resolved.  By the time he got to the office he was in  normal sinus rhythm.  This weekend he continued to have episodes of  tachy palpitations.  He had worsening tachycardia whenever he walked up  and down the steps.  He did have some shortness of breath and some mild  chest tightness.  Today he presented for his echocardiogram.  He was  found to have a large mass in his left atrium and is now admitted.  He  denies any syncope or presyncope.  He denies any PND, orthopnea.  He  denies any hemoptysis.  He denies any heat or cold intolerance.   CURRENT MEDICATIONS:  1. Aspirin 325 mg a day.  2. Lipitor 10 mg a day.  3. Cimetidine 400 mg a day.  4. Diazepam q.h.s.   ALLERGIES:  PENICILLIN CAUSES A RASH.   PAST MEDICAL HISTORY:  1. History of coronary artery disease.  He is status post coronary      bypass grafting 2005.  He had a left internal mammary artery to his      LAD, saphenous vein graft to the diagonal branch and a saphenous      vein graft to the obtuse marginal.  2. Hypertension.  3. Hyperlipidemia.  4. Paroxysmal atrial  fibrillation.   SOCIAL HISTORY:  The patient is a nonsmoker.  He lives at home with his  wife.   FAMILY HISTORY:  Positive for coronary artery disease.   REVIEW OF SYSTEMS:  Reviewed and is essentially negative except as noted  in the HPI.   PHYSICAL EXAMINATION:  GENERAL:  He is an elderly gentleman in no acute  distress.  He is alert and oriented x3 and his mood and affect are  normal.  VITAL SIGNS:  His heart rate 115, blood pressure 110/70.  HEENT:  Exam reveals pupils carotids no JVD, no thyromegaly.  LUNGS: Clear to auscultation.  HEART:  Heart is irregularly irregular and somewhat tachycardia.  ABDOMEN:  Nontender.  EXTREMITIES:  He has 1+ pitting edema.  NEUROLOGICAL:  Nonfocal.   STUDIES:  EKG reveals atrial fibrillation.   His echocardiogram reveals overall normal left ventricular systolic  function.  He has a large mass in his left atrium consistent with either  a thrombus or perhaps an intracardiac mass.   [name] presents with an intracardiac mass versus of thrombus.  He  does have an indication for anticoagulation.  I would like to go ahead  and admit him to hospital for initiation of heparin and later Coumadin.  We may need to do evaluate this mass further.  I would like to consider  getting an MRI on it on his heart.  All of his other medical problems  remain fairly stable.           ______________________________  Thayer Headings, M.D.     PJN/MEDQ  D:  01/17/2007  T:  01/18/2007  Job:  [id number]   cc:   Nelda Severe. Juventino Slovak, M.D.

## 2010-12-23 NOTE — Cardiovascular Report (Signed)
[name], [name] NO.:  [id number]   MEDICAL RECORD NO.:  [id number]          PATIENT TYPE:  INP   LOCATION:  [id number]                         FACILITY:  [facility]   PHYSICIAN:  Thayer Headings, M.D. DATE OF BIRTH:  [birth year]   DATE OF PROCEDURE:  01/24/2007  DATE OF DISCHARGE:                            CARDIAC CATHETERIZATION   HISTORY:  [name] is an 77 year old gentleman with a history of  coronary artery disease.  He is status post coronary artery bypass  grafting.  He was recently found to have a left atrial mass; that is  consistent with the left atrial myxoma.  He is referred for heart  catheterization for further evaluation.   PROCEDURE:  Right left heart catheterization with coronary angiography.   The right femoral artery and right femoral vein were easily cannulated  using a modified Seldinger technique.   HEMODYNAMICS:  The RA pressure is 10.  The right ventricular pressure is  32/4.  Pulmonary capillary wedge pressure is 11.  Pulmonary artery  pressure is 34/12 with a mean of 21.   PA saturation is 68%.  Aortic saturation is 96%.  Fick cardiac output is  5.2 with a cardiac index of 2.8.  Thermodilution cardiac output is 4.5  with a cardiac index of 2.4.  There was no gradient across the mitral  valve.   LEFT VENTRICULOGRAM:  Performed in a 30-RAO position.  The left  ventricle was somewhat underfilled.  It reveals overall normal left  ventricular systolic function.  I could not assess any degree of mitral  regurgitation.  It did not appear to be severe; but, again, this LV-gram  was somewhat underfilled.   CORONARY ANATOMY:  1. The left main has minor luminal irregularities.  2. The left anterior descending artery has proximal 20-30%.  This was      followed by a 60% and then a 70% stenosis.  Following this there      was competitive flow from the left internal mammary artery.  3. The first diagonal artery is fairly small.  The second  diagonal      artery is not visualized.  4. The left circumflex artery has a 60-70% stenosis at its origin.  5. The first obtuse marginal artery is a large and branching vessel      with no irregularities.  6. The right coronary artery is very large and is diffusely diseased      between 20 and 30%.  The posterior descending artery is fairly      small.  It is otherwise normal.  The posterolateral segment artery      has minor luminal irregularities.  7. The saphenous vein graft to the diagonal artery is a very large      graft with a small native vessel.  There are minor luminal      irregularities.  8. The saphenous vein graft to the obtuse marginal artery is occluded.  9. The left internal mammary artery is a fairly large vessel.  The      anastomosis to the LAD is normal.  There is brisk flow  down the      LAD.  There is competitive flow visible from the native left main      and LAD.   COMPLICATIONS:  None.   CONCLUSIONS:  1. Severe native coronary artery disease.  He has a patent left      internal mammary artery to the LAD.  He has a patent saphenous vein      graft to the first diagonal artery.  The saphenous vein graft to      the obtuse marginal artery      is occluded.  2. He has a relatively normal left ventricular systolic function.  He      has relatively normal right ventricular pressures.  He has a known      left atrial myomatous and is scheduled for surgery on Thursday.           ______________________________  Thayer Headings, M.D.     PJN/MEDQ  D:  01/24/2007  T:  01/24/2007  Job:  [id number]   cc:   Gilford Raid, M.D.

## 2010-12-23 NOTE — Op Note (Signed)
NAMEASIYAH, MELLER NO.:  [id number]   MEDICAL RECORD NO.:  [id number]          PATIENT TYPE:  INP   LOCATION:  2304                         FACILITY:  [facility]   PHYSICIAN:  Gilford Raid, M.D.     DATE OF BIRTH:  1920/08/02   DATE OF PROCEDURE:  01/27/2007  DATE OF DISCHARGE:                               OPERATIVE REPORT   PREOPERATIVE DIAGNOSES:  1. Single-vessel coronary artery disease, status post coronary bypass      graft surgery in 2005.  2. Left atrial mass.   POSTOPERATIVE DIAGNOSES:  1. Single-vessel coronary artery disease, status post coronary bypass      graft surgery in 2005.  2. Left atrial mass.   OPERATIVE PROCEDURE:  1. Redo median sternotomy.  2. Extracorporeal circulation.  3. Redo coronary artery bypass graft surgery x1 using a saphenous vein      graft to the obtuse marginal branch of the left circumflex coronary      artery.  4. Resection of left atrial tumor.  5. Endoscopic vein harvesting from the right leg.   ATTENDING SURGEON:  Gilford Raid, MD   ASSISTANT:  Jadene Pierini, PA-C   ANESTHESIA:  General endotracheal.   CLINICAL HISTORY:  This patient is an 77 year old gentleman who  underwent coronary bypass surgery x3 by me in 2005.  He has done well  postoperatively.  This spring he was noted to have a slow heart rate in  the 40s and was told that he should follow up with his physician.  He  was on Lopressor at the time, which was discontinued.  After this was  discontinued, he started having some tachy-palpitations and was referred  to Dr. Acie Fredrickson for further evaluation.  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  A 2-D echocardiogram showed a left atrial mass  suspicious for a thrombus measuring about 3 x 2 cm.  There was mild  mitral regurgitation and moderate aortic insufficiency.  There was a  moderate degree of left atrial enlargement.  He was  noted to be in  atrial fibrillation.  He was admitted on June 9 for intravenous heparin  and consideration of Coumadin therapy.  He underwent a transesophageal  echocardiogram that showed a 2 x 3-cm mass in the left atrium that  appeared to be attached to the atrial septum; this was well  circumscribed and slightly mobile.  There was mild mitral regurgitation  and moderate aortic insufficiency according to the report.  I reviewed  the echocardiogram and did not feel the aortic insufficiency looked that  impressive.  His left ventricular function was at the lower limit of  normal.  He subsequently underwent an MRI that showed that this was not  a thrombosed and more likely a tumor.  He subsequently underwent cardiac  catheterization which showed a patent left internal mammary graft to the  LAD.  The saphenous vein graft to diagonal branch was patent.  The  saphenous vein graft to obtuse marginal was occluded.  His LAD was  patent with a significant  proximal stenosis.  There was competitive flow  in the LAD from the left internal mammary graft.  The left circumflex  had a 60% to 70% ostial stenosis.  There was a single marginal branch.  The right coronary was a large ectatic vessel with diffuse irregularity,  but no significant stenosis.  Left ventricular function appeared well-  preserved.   After review of the angiogram and examination of the patient, I agreed  with Dr. Acie Fredrickson that redo coronary bypass to the obtuse marginal and  resection of this left atrial mass were the best treatment.  He did have  some aortic insufficiency, but I thought this was probably in the mild  to moderate range and did not feel that aortic valve replacement was  indicated in this 77 year old patient who was having redo surgery.  I  discussed the operative procedure with the patient and his wife.  We  discussed alternatives, benefits, and risks including, but not limited  to, bleeding, blood transfusion,  infection, stroke, myocardial  infarction, graft failure, and death.  He understood all of this and  agreed to proceed.   OPERATIVE PROCEDURE:  The patient was taken to the operating room and  placed on the table in supine position.  After induction of general  endotracheal anesthesia, a Foley catheter was placed in the bladder  using sterile technique.  Then the chest, abdomen and both lower  extremities were prepped and draped in the usual sterile manner.  Transesophageal echocardiogram was performed by Anesthesiology; this was  read by Dr. Lillia Abed.  This demonstrated the 3 x 2-cm left atrial  tumor.  Left ventricular function appeared well-preserved.  There was  some mild to moderate aortic insufficiency and no significant mitral  regurgitation.   Then the sternum was opened through the previous median sternotomy  incision.  The sternal wires were removed and the sternum opened using  the oscillating saw without difficulty.  Bone hooks were used to retract  the sternal edges and the pericardium was dissected from the back of the  sternum.  Then at the same time, a segment of greater saphenous vein was  harvested from the right leg using endoscopic vein harvest technique.  This vein was of medium caliber and good quality.   Then dissection was performed to expose the right atrium and ascending  aorta.  There were moderately dense adhesions present.  Then the patient  was heparinized and when an adequate activated clotting time was  achieved, the distal ascending aorta was cannulated using a 20-French  aortic cannula for arterial inflow.  Venous outflow was achieved using a  24-French right-angle metal-tipped cannula placed through a pursestring  suture in the superior vena cava and a 34-French plastic right-angle  cannula placed through a pursestring suture in the low right atrium.  An antegrade cardioplegia and vent cannula was inserted in the aortic root.   Then the patient  was placed on cardiopulmonary bypass and the remainder  of the heart was dissected from the pericardium.  Dissection was  performed to expose the left internal mammary pedicle as it entered the  pericardium; this was encircled and carefully preserved.  Then the  distal coronaries were identified.  The obtuse marginal branch was a  difficult vessel to expose.  It was not a very large vessel and had been  grafted quite high on the lateral wall.  It was intramyocardial along  most of its extent and was located in its midportion previously where it  was grafted.  The vessel distal to this site was felt to be too small  for repeat grafting.  The vein graft hood at the distal anastomosis was  calcified and therefore I could not put a graft into that.  I was able  to localize the artery proximal to the previous anastomosis, where it  was slightly larger and felt to be suitable for grafting.   Then the aorta was crossclamped and 800 mL of cold blood antegrade  cardioplegia were administered in the aortic root with quick arrest of  the heart.  Systemic hypothermia to 28 degrees centigrade and topical  hypothermic with iced saline was used.  A temperature probe was placed  in the septum and an insulating pad in the pericardium.   Then the first distal anastomosis was performed to the obtuse marginal  branch.  The internal diameter of this vessel was about 1.6 mm.  The  conduit that was used was a segment of greater saphenous vein and the  anastomosis performed in an end-to-side manner using a continuous 7-0  Prolene suture.  Flow was noted through the graft and was excellent.   Then another dose of cardioplegia was given.  Then attention was turned  to the atrial tumor.  The superior and inferior vena cavae were  encircled with tapes.  The right atrium was opened through the right  atrial appendage down onto the lateral wall in an oblique fashion.  The  valve retractor was placed for exposure.   Examination of the fossa  ovalis showed that it was pliable and I could feel the atrial tumor on  the other side.  The interatrial septum was opened through the fossa  ovalis.  Examination of the left atrial tumor showed that it was well  circumscribed, measuring about 3 x 2 cm.  It had a broad base attached  to the interatrial septum and also down onto the posterior wall of the  left atrium.  I was able to excise a piece of the interatrial septum at  the fossa ovalis as well as a small piece of the posterior wall along  with the tumor to be sure that there was complete excision.  The opening  in the posterior wall of the left atrium was closed with 2 layers of  continuous 4-0 Prolene suture.  The septum was somewhat redundant and  was therefore able to be closed with 2 layers of continuous 4-0 Prolene  suture.  Prior to tying the sutures, the left side of the heart was de- aired.  The atrial tumor was sent to Pathology for permanent section.  Then another dose of cardioplegia was given.  The right atriotomy was  closed in 2 layers using a continuous 4-0 Prolene suture.  The caval  tapes were removed.  Then the single proximal vein graft anastomosis was  performed to the hood of the old diagonal vein graft, which was still  patent.  The patient's old left circumflex graft had significant  calcification in the hood and was not felt to be suitable as a proximal  site.  The ascending aorta also had some calcified plaque in it and  therefore I felt it would be best to perform the anastomosis to the hood  of one of the old vein grafts.  Then the left side heart was de-aired.  The head was placed in the Trendelenburg position.  The clamp was  removed from mammary artery pedicle.  There was rapid return of  spontaneous ventricular fibrillation.  The crossclamp was removed with a  time of 143 minutes and the patient spontaneously converted to sinus  rhythm.  The proximal and distal anastomoses  appeared hemostatic and  lines of the grafts satisfactory.  A graft marker was placed around the  proximal anastomosis.  Two temporary right ventricular and right atrial  pacing wires were placed and brought out through the skin.   When the patient had rewarmed to 37 degrees centigrade, he was weaned  from cardiopulmonary bypass on no inotropic agents.  Total bypass time  was 181 minutes.  Cardiac function appeared excellent.  Transesophageal  echocardiogram showed no atrial tumor.  Left ventricular function  appeared well-preserved.  The degree of mitral regurgitation and aortic  insufficiency were unchanged.  Then protamine was given and the venous  and aortic cannulas were removed without difficulty.  Hemostasis was  achieved.  Four chest tubes were placed with a tube in the posterior  pericardium, 1 in the anterior mediastinum and 1 in each pleural space.  the sternum was then closed with #6 stainless steel wires.  The fascia  was closed with continuous #1 Vicryl suture.  Subcutaneous tissue was  closed with continuous 2-0 Vicryl and skin with a 3-0 Vicryl  subcuticular closure.  The lower extremity vein harvest site was closed  in layers in a similar manner.  The sponge, needle and instrument counts  were correct according to the scrub nurse.  Dry sterile dressings were  applied over the incision and around the chest tubes, which were hooked  to Pleur-evac suction.  The patient remained hemodynamically stable and  was transported to the SICU in guarded but stable condition.      Gilford Raid, M.D.  Electronically Signed     BB/MEDQ  D:  01/27/2007  T:  01/28/2007  Job:  [id number]   cc:   Thayer Headings, M.D.

## 2010-12-26 NOTE — Op Note (Signed)
NAMEWILLMAR, [name] NO.:  [id number]   MEDICAL RECORD NO.:  [id number]          PATIENT TYPE:  OIB   LOCATION:  2870                         FACILITY:  [facility]   PHYSICIAN:  Mark C. Lorin Mercy, M.D.    DATE OF BIRTH:  09/13/1919   DATE OF PROCEDURE:  02/02/2005  DATE OF DISCHARGE:  02/02/2005                                 OPERATIVE REPORT   PREOPERATIVE DIAGNOSIS:  Right shoulder complete rotator cuff tear.   POSTOPERATIVE DIAGNOSIS:  Right shoulder degenerative labrum, complete  rotator cuff tear.   OPERATION PERFORMED:  Diagnostic and operative arthroscopy.  Debridement of  degenerative labrum and rotator cuff.  Miniopen acromioplasty and rotator  cuff repair.   SURGEON:  Mark C. Lorin Mercy, M.D.   ANESTHESIA:  General plus 20 local.   DESCRIPTION OF PROCEDURE:  After induction of general anesthesia, the  patient had refused a preoperative block by the anesthesia service and  underwent general anesthesia, preoperative Ancef prophylaxis.  Standard  DuraPrep, usual beach chair position with split U-sheets and arthroscopic  pouch.  The scope was placed from the posterior portal.  The shoulder was  entered bluntly.  Marcaine was infiltrated in the skin.  The anterior portal  was made __in out technique inferior to the__  biceps tendon.  There was  obvious tear of the rotator cuff with fronding, pieces of the undersurface  of the rotator cuff hanging down into the glenohumeral space.  The labrum  was degenerative and was trimmed back.  Biceps anchor was tested and also  the edges were degenerative and torn, it was trimmed back.  The biceps  anchor was still attached to the glenoid and did not require repair.  Anterior ligaments were inspected and were normal.  There was some grade 1  and 2 chondromalacia.  No grade 3 changes, no grade 4.  The undersurface of  the rotator cuff was trimmed back and it was obvious that this was a  complete tear.  A small median incision  was made along the anterior aspect  of the acromion.  The deltoid was peeled off with a Bovie electrocautery.  Three quarter straight osteotome was introduced.  A large spur was removed.  A self-retaining retractor was placed which was a Museum/gallery exhibitions officer, bursectomy  performed.  Edge of the cuff cut back to 3 more millimeters for a straight  zone.  Bone freshened up with a rongeur and two Ultrafix anchors were placed  with horizontal mattress sutures through the cuff pulling the cuff down to  the bone and then oversewing the repair back to the old lateral stump of the  rotator cuff with the #2 Tycron.  The deltoid was repaired back to the  acromion with three holes in the bone.  Split in the deltoid of 0.5 cm was  reapproximated loosely, 2-0 Vicryl in the subcutaneous tissue and skin  staple closure as well as the portals.  Instrument count and needle count  was correct. The patient was then placed with Marcaine in the subacromial  space, 10 cc, some Marcaine in the skin and some in the shoulder  joint for  postoperative analgesia.  Instrument count and needle count was correct.      MCY/MEDQ  D:  02/02/2005  T:  02/03/2005  Job:  [id number]

## 2010-12-26 NOTE — Cardiovascular Report (Signed)
NAME:  [name], [name]                       ACCOUNT NO.:  [id number]   MEDICAL RECORD NO.:  [id number]                   PATIENT TYPE:  INP   LOCATION:  3703                                 FACILITY:  [facility]   PHYSICIAN:  Thayer Headings, M.D.              DATE OF BIRTH:  [birth year]   DATE OF PROCEDURE:  08/14/2003  DATE OF DISCHARGE:                              CARDIAC CATHETERIZATION   PROCEDURE PERFORMED:  Cardiac catheterization.   CARDIOLOGIST:  Thayer Headings, M.D.   INDICATIONS FOR PROCEDURE:  Mr. Tellinghuisen is an 77 year old gentleman with  the recent onset of chest pain.  He was seen and admitted through the  emergency room yesterday for episodes of chest pain.  He had profound ST  segment depression consistent with a subendocardial myocardial infarction.  He cooled off nicely with Integrilin, heparin and nitroglycerin.  We brought  him into the cath lab for further evaluation today.   DESCRIPTION OF PROCEDURE:  The right femoral artery was easily cannulated  using the modified Seldinger technique.   HEMODYNAMIC DATA:  LV pressure was 122/14 with an aortic pressure of 124/60.   ANGIOGRAPHIC DATA:  The left main had minor luminal irregularities.   The left anterior descending artery had minor luminal irregularities in the  very proximal aspect of the vessel.  The proximal and mid segments were  severely and diffusely diseased between 60 and 80%.  There were three  separate lesions throughout this segment.  The flow through the LAD was  fairly well-preserved.   There was a large first diagonal branch, which originated in the first of  these lesions.  There was a 99% stenosis in the origin of this lesion and  the __________ down this branch was TIMI Grade II.  There was approximately  an 80-degree angle from the LAD to the diagonal vessel.   The left circumflex artery was a moderate-sized vessel.  There was a 60-65%  stenosis at the takeoff of the left circumflex  artery.  There were minor  luminal irregularities in the circumflex artery.   The right coronary artery was very large and dominant.  There were minor  luminal irregularities throughout the course of the LAD.  There were no  discrete stenoses.   Left ventriculogram was performed in a 30 RAO position.  It revealed overall  well-preserved left ventricular systolic function.  The ejection fraction  was 55-60%.  There was no mitral regurgitation.   COMPLICATIONS:  None.   CONCLUSION:  Severe coronary artery disease involving the left anterior  descending and diagonal system.  He has moderate disease involving the left  circumflex artery and mild disease involving the right coronary artery.   The patient's options at this point include coronary artery bypass grafting  with grafting to the left anterior descending and diagonal vessel, and  probably a graft to the left circumflex artery.  It is possible that we  could  also do a high risk angioplasty and stent procedure to the diagonal  and the left anterior descending.  We will discuss these issues with the  patient and his family.  He remained stable.                                               Thayer Headings, M.D.    PJN/MEDQ  D:  08/14/2003  T:  08/14/2003  Job:  [id number]

## 2010-12-26 NOTE — Op Note (Signed)
NAME:  [name], [name]                       ACCOUNT NO.:  [id number]   MEDICAL RECORD NO.:  [id number]                   PATIENT TYPE:  INP   LOCATION:  2311                                 FACILITY:  [facility]   PHYSICIAN:  Gilford Raid, M.D.                  DATE OF BIRTH:  05-29-1920   DATE OF PROCEDURE:  08/15/2003  DATE OF DISCHARGE:                                 OPERATIVE REPORT   PREOPERATIVE DIAGNOSES:  Severe two-vessel coronary disease status post  acute myocardial infarction.   POSTOPERATIVE DIAGNOSES:  Severe two-vessel coronary disease status post  acute myocardial infarction.   OPERATIVE PROCEDURE:  Median sternotomy, extracorporeal circulation,  coronary artery bypass graft surgery x 3 using left internal mammary artery  graft, left anterior descending coronary artery, with a saphenous vein graft  to the diagonal branch of the LAD, and saphenous vein graft to the obtuse  marginal branch of the left circumflex coronary artery.  Endoscopic vein  harvested from the left thigh.   ATTENDING SURGEON:  Gilford Raid, M.D.   ASSISTANT:  Valentina Gu. Roxy Manns, M.D.   SECOND ASSISTANT:  Lestine Box, N.P., and Myra Gianotti, P.A.-C.   ANESTHESIA:  General endotracheal.   CLINICAL HISTORY:  This patient is an 77 year old previously healthy white  male who presented with new onset acute chest pain and ruled in for  myocardial infarction with a peak CPK of 333 with and MB of 150.  Electrocardiogram showed anterior septal ST changes which evolved with loss  of height of the R wave in the septal leads.  He continued to have some  chest pain after admission and underwent cardiac catheterization, which  showed severe two-vessel disease.  The LAD had 60-70% proximal stenosis and  then about 80% stenosis with a large diagonal come off of this area that had  99% ostial stenosis.  The left circumflex had about 65% ostial stenosis.  The right coronary artery was a large dominant  vessel with diffuse  irregularity but no significant stenosis.  Left ventricular function was  normal.  After review of the angiogram and examination of the patient, it  was felt that coronary artery bypass graft surgery was the best treatment.  I discussed the operative procedure with the patient and his family  including alternatives, benefits and risks including bleeding, blood  transfusion, infection, stroke, myocardial infarction and death.  They  understood and agreed to proceed.   OPERATIVE PROCEDURE:  The patient was taken to the operating room and placed  on the table in the supine position.  After induction of general  endotracheal anesthesia, a Foley catheter was placed in the bladder using  sterile technique.  Then the chest, abdomen and both lower extremities were  prepped and draped in the usual sterile manner.  The chest was entered  through a median sternotomy incision and the pericardium opened in the  midline.  Examination of the heart  showed good ventricular contractility.  The ascending aorta had no palpable plaques in it.   Then the left internal mammary artery was harvested from the chest wall as a  pedicle graft.  This was a medium caliber vessel with excellent flow.  At  the same time, a 7 mm greater saphenous vein was harvested from the left  leg.  This was performed endoscopically.  The vein was of large caliber and  had a thick wall.  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  He had had a previous right total knee  replacement and therefore the vein in the right thigh was not used.   Then the patient was heparinized.  When an adequate activated clotting time  was achieved, the distal ascending aorta was cannulated using a 20 French  aortic cannula for arterial inflow.  Venous outflow was achieved using a two-  stage venous cannula through the right atrial appendage.  An antegrade  cardioplegia cannula was inserted  in the aortic root.   The patient was placed on cardiopulmonary bypass and the distal coronary was  identified.  The LAD was a large graftable vessel.  The diagonal branch was  diffusely diseased but graftable.  The obtuse marginal was a medium-sized  graftable vessel that was intramyocardial but was located proximally just  before it entered the muscle where it was graftable.   The aorta was cross-clamped and 500 cc of cold blood antegrade cardioplegia  was administered in the aortic root with quick arrest of the heart.  Systemic hypothermia to 20 degrees Centigrade and topical hypothermia iced  saline was used.  A temperature probe was placed in the septum and inflated  and tied in the pericardium.   The first distal anastomosis was performed in the obtuse marginal coronary  artery.  The internal diameter of this vessel was 1.75 mm.  The conduit used  was a 7 mm greater saphenous vein and anastomosis performed in an end-to-  side manner using continuous 7-0 Prolene suture.  Flow was measured through  the graft and was excellent.   The second distal anastomosis was performed to the diagonal branch.  The  internal diameter was also about 1.75 mm.  Conduit used was a second 7 mm  greater saphenous vein and anastomosis performed in end-to-side manner using  continuous 7-0 Prolene suture.  Flow was noted through the graft and was  excellent.  Then another dose of cardioplegia was given in all the vein  grafts and in the aortic root.   The third distal anastomosis was performed to the mid portion of the left  anterior descending coronary artery.  The internal diameter was about 2.5  mm.  The conduit used was the left internal mammary artery graft, and this  was brought through an opening in the left pericardium anterior to the  phrenic nerve.  It was anastomosed to the LAD in end-to-side manner using continuous 8-0 Prolene suture.  The pedicle was tacked to the epicardium  with 6-0  Prolene sutures.  Then with the cross clamp in place, the two  proximal vein graft anastomoses were performed in the aortic root in end-to-  side manner using continuous 6-0 Prolene suture.  The clamp was removed from  the mammary pedicle.  The patient was rewarmed to 37 degrees Centigrade.  There was rapid warming of the ventricular septum and return of spontaneous  ventricular fibrillation.  A cross-clamp removed with time of 58  minutes and  the patient defibrillated into sinus rhythm.   The proximal and distal anastomoses appeared hemostatic and lie of the graft  satisfactory.  A graft marker was placed around the proximal anastomosis.  Two temporary right ventricular and right atrial pacing wires were placed  and brought through the skin.   With the patient rewarmed to 37 degrees Centigrade, he was weaned from  cardiopulmonary bypass on no inotropic agents.  Total bypass time was 87  minutes.  Cardiac function appeared excellent with a cardiac output of 5 L  per minute.  Protamine was given and the venous and aortic cannulas removed  without difficulty.  The patient was given ten units of platelets since his  platelet count was relatively low at about 118,000, and he had been on  heparin, aspirin and Integrilin preop and appeared coagulopathic.  This  resulted in an adequate hemostasis.  Three chest tubes were placed, with  tube through the posterior pericardium, one in the left pleural space and  one in the anterior mediastinum.  The pericardium could not be completely  reapproximated over the heart.  The sternum was closed with #6 stainless  steel wires.  The fascia was closed with continuous #1 Vicryl suture.  The  subcutaneous tissue was closed with continuous 2-0 Vicryl and the skin with  3-0 Vicryl subcuticular closure.  The lower extremity vein harvest site was  closed in layers in a similar manner.  The sponge, needle, and instrument  counts were correct  according to scrub  nurse.  Dry sterile dressings were applied over the  incisions and around the chest tubes, which were hooked to Pleur-Evac  suction.  The patient remained hemodynamically stable and was transported to  the SICU in guarded but stable condition.                                               Gilford Raid, M.D.    BB/MEDQ  D:  08/15/2003  T:  08/15/2003  Job:  [id number]   cc:   Thayer Headings, M.D.  [address] [place] [address]  Fax: [phone]   Cardiac Cath Lab

## 2010-12-26 NOTE — H&P (Signed)
NAME:  [name], [name] NO.:  [id number]   MEDICAL RECORD NO.:  [id number]                   PATIENT TYPE:  EMS   LOCATION:  MAJO                                 FACILITY:  [facility]   PHYSICIAN:  Thayer Headings, M.D.              DATE OF BIRTH:  04-28-1920   DATE OF ADMISSION:  08/13/2003  DATE OF DISCHARGE:                                HISTORY & PHYSICAL   HISTORY OF PRESENT ILLNESS:  [name] is an 77 year old gentleman with  no significant past medical history.  He does have a history of hypertension  in the past.  He presents to the emergency room today with a sudden onset of  chest pain.  We were asked to see him for further evaluation.   The patient has been relatively healthy.  He has a family history of  coronary artery disease and so has been taking an aspirin a day for the past  several years.  He has never had any episodes of chest pain or shortness of  breath.  He has been quite active and is able to do all of his normal  activities without any problems.   This morning he developed some substernal chest pain.  The pain radiated up  into his jaw and down to his arm.  It was associated with some shortness of  breath.  He chewed several additional aspirin and came to the emergency  room.  Here in the ER he was given nitroglycerin, heparin, and eventually  Integrilin.  His pain fairly promptly resolved after the addition of these  three medications.  He is now feeling quite a bit better.   CURRENT MEDICATIONS:  1. Cimetidine once a day.  2. Aspirin once a day.   ALLERGIES:  None.   PAST MEDICAL HISTORY:  1. History of gastritis.  2. History of mild hypertension.   SOCIAL HISTORY:  The patient is a nonsmoker.   FAMILY HISTORY:  Positive for coronary artery disease.   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   His review of systems was reviewed and is essentially negative.   PHYSICAL EXAMINATION:   GENERAL:  He is an elderly gentleman in no acute  distress.  He is alert and oriented x3 and his mood and affect are normal.  VITAL SIGNS:  His blood pressure is 115/55 with a heart rate of 60.  HEENT:  Reveals 2+ carotids.  He has no bruits.  There is no JVD, no  thyromegaly.  LUNGS:  Clear to auscultation.  HEART:  Regular rate, S1, S2, with no murmurs, gallops, or rubs.  ABDOMEN:  Reveals good bowel sounds and is nontender.  EXTREMITIES:  He has no clubbing, cyanosis, or edema.  NEUROLOGIC:  Nonfocal.   His EKG originally had a normal sinus rhythm.  He had deep ST segment  depressions in the lateral leads.  His  second EKG revealed significant  improvement of the ST segment depressions.  There was a slight amount of ST  segment depression which persists but it is significantly better.  His  laboratory data is pending.   [name] presents with signs and symptoms consistent with a myocardial  infarction.  His EKG supports the diagnosis of a lateral subendocardial  myocardial infarction.  He is significantly better on the heparin,  Integrilin, and nitroglycerin drips.  We will continue with these  medications.  He is currently pain free.  We will anticipate doing a heart  catheterization tomorrow.  All of his other medical problems are stable.                                                Thayer Headings, M.D.    PJN/MEDQ  D:  08/13/2003  T:  08/13/2003  Job:  [id number]

## 2010-12-26 NOTE — Discharge Summary (Signed)
NAME:  [name], [name]                       ACCOUNT NO.:  [id number]   MEDICAL RECORD NO.:  [id number]                   PATIENT TYPE:  INP   LOCATION:  2022                                 FACILITY:  [facility]   PHYSICIAN:  Gilford Raid, M.D.                  DATE OF BIRTH:  [birth year]   DATE OF ADMISSION:  08/13/2003  DATE OF DISCHARGE:  08/21/2003                                 DISCHARGE SUMMARY   HISTORY OF THE PRESENT ILLNESS:  Mr. Quiggins is an 77 year old gentleman  with no significant medical history, with the exception of hypertension.  He  presented this hospitalization with new-onset acute chest pain and ruled in  for myocardial infarction with a peak CPK of 333 and an MB fraction of 150.  Admission EKG showed anteroseptal ST changes which evolved with loss of  height of the R wave in the septal leads.  He was felt to require admission  for further evaluation and treatment including cardiac catheterization with  possible revascularization.   MEDICATIONS ON ADMISSION:  1. Cimetidine 1 daily.  2. Aspirin 1 daily.   ALLERGIES:  None.   PAST MEDICAL HISTORY:  1. History of gastritis.  2. History of mild hypertension.   SOCIAL HISTORY, FAMILY HISTORY, REVIEW OF SYSTEMS AND PHYSICAL EXAM:  Please  see the history and physical done at the time of admission.   HOSPITAL COURSE:  The patient was admitted with signs and symptoms  consistent with myocardial infarction, as described.  He was started on  intravenous heparin, Integrilin and nitroglycerin drips.  He was stabilized  to a pain-free state.  He was subsequently scheduled for cardiac  catheterization, which was undertaken on August 14, 2003.  This showed  severe two-vessel disease; the LAD had a 60-70% proximal stenosis and then  about an 80% stenosis with a large diagonal coming off this area that had a  99% ostial stenosis.  The left circumflex had about 65% ostial stenosis.  The right coronary artery was a large  dominant vessel with diffuse  irregularity but no significant stenosis.  Left ventricular function was  normal.  Due to these findings, the patient was recommended surgical  revascularization and consultation was obtained with Gilford Raid, M.D., who  evaluated the patient and studies and the procedure was subsequently  scheduled.   PROCEDURE:  On August 15, 2003, the patient was taken to the operating room  where they underwent the following procedure:  Coronary artery bypass  grafting x3.  The following grafts were places:  #1 - A left internal  mammary artery to the left anterior descending coronary artery.  The next  was a saphenous vein graft to the diagonal branch of the left anterior  descending.  And finally, there was a saphenous vein graft to the obtuse  marginal branch of the left circumflex coronary artery.  Endoscopic vein was  harvested from the left thigh.  The patient tolerated the procedure well and  was taken to the surgical intensive care unit in stable condition.  Of note,  the patient was found to have a very large, thick-walled vein.  The left  internal mammary artery was felt to be of good quality.   POSTOPERATIVE HOSPITAL COURSE:  The patient has done quite well.  He has  maintained stable hemodynamics.  He did have postoperative atrial  fibrillation which has been chemically cardioverted with antiarrhythmics and  rate-control regimen.  All routine lines and monitors and drainage devices  have been discontinued in a standard fashion.  He does have a postoperative  anemia, but this is felt to be stable and moderate.  Most recent hemoglobin  and hematocrit dated August 20, 2003 are 9.3 and 26.5, respectively.  Electrolytes, BUN and creatinine are all within normal limits.  Incisions  are healing well without signs of infection.  He has tolerated diet and  activities commensurate for level of postoperative convalescence using  cardiac rehabilitation phase 1  modalities.  Oxygen is being weaned and he  maintains good saturations on room air.  He has responded well to the  diuresis but will require further as an outpatient for some volume excess.  The patient has required a fairly aggressive pulmonary toilet but is doing  well.  Overall, it is tentatively felt that he is stable for discharge in  the morning of August 21, 2003, pending morning round reevaluation.   MEDICATIONS ON DISCHARGE:  1. Aspirin 325 mg daily.  2. Lopressor 25 mg twice daily.  3. Lipitor 10 mg daily.  4. Lasix 40 mg daily for 7 days.  5. K-Dur 20 mEq daily for 7 days.  6. Amiodarone 400 mg 3 times daily for 5 days, then twice daily.  7. For pain, Tylox 1 every 4 to 6 hours as needed.   ACTIVITY:  The patient is encouraged to not do any heavy pulling or pushing  with his arms.  He is not to lift anything more than 10 pounds.  He is to  continue to increase his walking as instructed.  He is encouraged not to  drive until further evaluation as an outpatient.   DIET:  Diet on discharge is recommended balanced, low-saturated-fat diet.   WOUND CARE:  The patient may shower, clean his incisions gently with soap  and water.   FOLLOWUP:  Followup will include appointments with his cardiologist, Dr.  Wonda Cheng Nahser, in 2 weeks; Dr. Cyndia Bent, Tuesday, September 04, 2003, at  11:30 a.m.   CONDITION ON DISCHARGE:  Condition on discharge is stable and improved.   FINAL DIAGNOSES:  1. Acute myocardial infarction.  2. Severe two-vessel coronary artery disease.  3. Hypertension, mild.  4. History of gastritis.  5. Postoperative anemia.  6. Postoperative atrial fibrillation, currently in normal sinus rhythm.      John Giovanni, P.A.-C.                    Gilford Raid, M.D.    Loren Racer  D:  08/20/2003  T:  08/20/2003  Job:  [id number]   cc:   Thayer Headings, M.D.  [address] [place] [address]  Fax: [phone]   Gilford Raid, M.D.  [address]  [place]  [place] [address]

## 2010-12-29 ENCOUNTER — Telehealth: Payer: Self-pay | Admitting: Cardiovascular Disease

## 2010-12-29 MED ORDER — METOPROLOL SUCCINATE ER 25 MG PO TB24: 25.0000 mg | ORAL_TABLET | Freq: Every day | ORAL | Status: DC

## 2010-12-29 NOTE — Telephone Encounter (Signed)
CALL PT ABOUT HIS REFILLS, NORMALLY USES MAIL ORDER. CHART IN BOX.

## 2010-12-29 NOTE — Telephone Encounter (Signed)
Metoprolol Succ. 25mg  daily called in while waiting for mail order

## 2011-01-16 ENCOUNTER — Encounter: Payer: Medicare Other | Admitting: *Deleted

## 2011-01-21 ENCOUNTER — Ambulatory Visit (INDEPENDENT_AMBULATORY_CARE_PROVIDER_SITE_OTHER): Payer: Medicare Other | Admitting: *Deleted

## 2011-01-21 DIAGNOSIS — I4891 Unspecified atrial fibrillation: Secondary | ICD-10-CM

## 2011-02-04 ENCOUNTER — Ambulatory Visit (INDEPENDENT_AMBULATORY_CARE_PROVIDER_SITE_OTHER): Payer: Medicare Other | Admitting: *Deleted

## 2011-02-04 DIAGNOSIS — I4891 Unspecified atrial fibrillation: Secondary | ICD-10-CM

## 2011-03-04 ENCOUNTER — Ambulatory Visit (INDEPENDENT_AMBULATORY_CARE_PROVIDER_SITE_OTHER): Payer: Medicare Other | Admitting: *Deleted

## 2011-03-04 DIAGNOSIS — I4891 Unspecified atrial fibrillation: Secondary | ICD-10-CM

## 2011-04-01 ENCOUNTER — Ambulatory Visit (INDEPENDENT_AMBULATORY_CARE_PROVIDER_SITE_OTHER): Payer: Medicare Other | Admitting: *Deleted

## 2011-04-01 DIAGNOSIS — I4891 Unspecified atrial fibrillation: Secondary | ICD-10-CM

## 2011-04-01 LAB — POCT INR: INR: 2.9

## 2011-04-29 ENCOUNTER — Ambulatory Visit (INDEPENDENT_AMBULATORY_CARE_PROVIDER_SITE_OTHER): Payer: Medicare Other | Admitting: *Deleted

## 2011-04-29 DIAGNOSIS — I4891 Unspecified atrial fibrillation: Secondary | ICD-10-CM

## 2011-05-27 ENCOUNTER — Ambulatory Visit (INDEPENDENT_AMBULATORY_CARE_PROVIDER_SITE_OTHER): Payer: Medicare Other | Admitting: *Deleted

## 2011-05-27 DIAGNOSIS — I4891 Unspecified atrial fibrillation: Secondary | ICD-10-CM

## 2011-05-27 LAB — HEPARIN LEVEL (UNFRACTIONATED)
Heparin Unfractionated: 0.38
Heparin Unfractionated: 0.41
Heparin Unfractionated: 0.46
Heparin Unfractionated: 0.53
Heparin Unfractionated: <0.1 — ABNORMAL LOW

## 2011-05-27 LAB — POCT I-STAT 3, VENOUS BLOOD GAS (G3P V)
O2 Saturation: 68
TCO2: 24

## 2011-05-27 LAB — I-STAT EC8
Acid-base deficit: 4 — ABNORMAL HIGH
Acid-base deficit: 6 — ABNORMAL HIGH
Bicarbonate: 19 — ABNORMAL LOW
Chloride: 98
HCT: 28 — ABNORMAL LOW
Hemoglobin: 9.5 — ABNORMAL LOW
Operator id: 177181
Sodium: 134 — ABNORMAL LOW
TCO2: 20
pCO2 arterial: 35
pH, Arterial: 7.387

## 2011-05-27 LAB — POCT I-STAT 3, ART BLOOD GAS (G3+)
Acid-Base Excess: 2
Acid-base deficit: 2
Bicarbonate: 19.5 — ABNORMAL LOW
Bicarbonate: 22.3
O2 Saturation: 100
O2 Saturation: 99
Operator id: 177181
Operator id: 262201
Operator id: 274661
Operator id: 3402
TCO2: 23
pCO2 arterial: 34.1 — ABNORMAL LOW
pO2, Arterial: 155 — ABNORMAL HIGH
pO2, Arterial: 162 — ABNORMAL HIGH
pO2, Arterial: 446 — ABNORMAL HIGH

## 2011-05-27 LAB — COMPREHENSIVE METABOLIC PANEL
ALT: 44
AST: 35
Alkaline Phosphatase: 43
CO2: 27
Chloride: 98
Creatinine, Ser: 1.35
GFR calc Af Amer: >60
GFR calc non Af Amer: 50 — ABNORMAL LOW
Potassium: 4.5
Sodium: 129 — ABNORMAL LOW
Total Bilirubin: 0.6

## 2011-05-27 LAB — CBC
HCT: 24.4 — ABNORMAL LOW
HCT: 27.6 — ABNORMAL LOW
HCT: 29.9 — ABNORMAL LOW
HCT: 31.5 — ABNORMAL LOW
HCT: 32 — ABNORMAL LOW
HCT: 33.8 — ABNORMAL LOW
Hemoglobin: 10 — ABNORMAL LOW
Hemoglobin: 10.6 — ABNORMAL LOW
MCHC: 33
MCHC: 33
MCHC: 33.1
MCV: 86.6
MCV: 87
MCV: 87.4
MCV: 87.4
MCV: 87.7
Platelets: 148 — ABNORMAL LOW
Platelets: 176
Platelets: 210
RBC: 2.8 — ABNORMAL LOW
RBC: 3.38 — ABNORMAL LOW
RBC: 3.63 — ABNORMAL LOW
RBC: 3.66 — ABNORMAL LOW
RBC: 3.8 — ABNORMAL LOW
RBC: 3.88 — ABNORMAL LOW
RBC: 3.97 — ABNORMAL LOW
RDW: 13.1
RDW: 13.3
RDW: 13.8
WBC: 10.3
WBC: 6.7
WBC: 7.4
WBC: 7.6
WBC: 7.7
WBC: 8.6

## 2011-05-27 LAB — URINALYSIS, ROUTINE W REFLEX MICROSCOPIC
Hgb urine dipstick: NEGATIVE
Ketones, ur: NEGATIVE
Protein, ur: NEGATIVE
Urobilinogen, UA: 0.2

## 2011-05-27 LAB — POCT I-STAT 4, (NA,K, GLUC, HGB,HCT)
Glucose, Bld: 115 — ABNORMAL HIGH
Glucose, Bld: 118 — ABNORMAL HIGH
Glucose, Bld: 96
HCT: 20 — ABNORMAL LOW
HCT: 21 — ABNORMAL LOW
HCT: 32 — ABNORMAL LOW
Hemoglobin: 6.8 — CL
Hemoglobin: 7.1 — CL
Hemoglobin: 7.8 — CL
Operator id: 3402
Operator id: 3402
Operator id: 3402
Potassium: 4.7
Potassium: 5.2 — ABNORMAL HIGH
Potassium: 6.5
Sodium: 129 — ABNORMAL LOW
Sodium: 130 — ABNORMAL LOW
Sodium: 133 — ABNORMAL LOW

## 2011-05-27 LAB — PROTIME-INR
INR: 1.1
INR: 1.2
INR: 1.3
INR: 1.3
Prothrombin Time: 13.9
Prothrombin Time: 14.9
Prothrombin Time: 15.5 — ABNORMAL HIGH
Prothrombin Time: 16.4 — ABNORMAL HIGH
Prothrombin Time: 19.2 — ABNORMAL HIGH

## 2011-05-27 LAB — POCT I-STAT GLUCOSE
Glucose, Bld: 103 — ABNORMAL HIGH
Operator id: 3402

## 2011-05-27 LAB — BASIC METABOLIC PANEL
BUN: 11
BUN: 17
CO2: 26
Calcium: 7.6 — ABNORMAL LOW
Calcium: 7.6 — ABNORMAL LOW
Calcium: 8.5
Chloride: 101
Chloride: 101
Chloride: 104
Creatinine, Ser: 1.19
Creatinine, Ser: 1.22
Creatinine, Ser: 1.37
GFR calc Af Amer: 52 — ABNORMAL LOW
GFR calc Af Amer: 60 — ABNORMAL LOW
GFR calc Af Amer: >60
GFR calc non Af Amer: 50 — ABNORMAL LOW
GFR calc non Af Amer: 57 — ABNORMAL LOW
GFR calc non Af Amer: 58 — ABNORMAL LOW
Glucose, Bld: 136 — ABNORMAL HIGH
Glucose, Bld: 98
Potassium: 4.3
Potassium: 4.4
Potassium: 4.5
Sodium: 135

## 2011-05-27 LAB — CROSSMATCH: Antibody Screen: NEGATIVE

## 2011-05-27 LAB — MAGNESIUM: Magnesium: 2.8 — ABNORMAL HIGH

## 2011-05-27 LAB — ABO/RH: ABO/RH(D): A POS

## 2011-05-27 LAB — HEMOGLOBIN AND HEMATOCRIT, BLOOD
HCT: 21.8 — ABNORMAL LOW
Hemoglobin: 7.2 — CL

## 2011-05-27 LAB — CREATININE, SERUM
Creatinine, Ser: 1.18
Creatinine, Ser: 1.42
GFR calc Af Amer: 57 — ABNORMAL LOW
GFR calc non Af Amer: 59 — ABNORMAL LOW

## 2011-05-27 LAB — BLOOD GAS, ARTERIAL
FIO2: 0.21
pCO2 arterial: 37.8
pO2, Arterial: 79.2 — ABNORMAL LOW

## 2011-05-28 LAB — CBC
HCT: 32.2 — ABNORMAL LOW
HCT: 32.7 — ABNORMAL LOW
HCT: 32.7 — ABNORMAL LOW
HCT: 33.3 — ABNORMAL LOW
HCT: 36.2 — ABNORMAL LOW
Hemoglobin: 10.7 — ABNORMAL LOW
Hemoglobin: 10.9 — ABNORMAL LOW
Hemoglobin: 11.1 — ABNORMAL LOW
MCHC: 32.8
MCHC: 33.6
MCHC: 33.8
MCHC: 34.1
MCV: 86.7
MCV: 86.7
MCV: 87.3
MCV: 88.4
MCV: 88.7
Platelets: 195
Platelets: 199
Platelets: 213
Platelets: 232
RBC: 3.69 — ABNORMAL LOW
RBC: 3.73 — ABNORMAL LOW
RBC: 3.77 — ABNORMAL LOW
RDW: 13.2
RDW: 13.2
RDW: 13.2
WBC: 7.1
WBC: 7.9

## 2011-05-28 LAB — BASIC METABOLIC PANEL
BUN: 12
CO2: 28
Calcium: 8.3 — ABNORMAL LOW
Chloride: 102
GFR calc Af Amer: 54 — ABNORMAL LOW
Glucose, Bld: 101 — ABNORMAL HIGH
Potassium: 4.3
Sodium: 136

## 2011-05-28 LAB — HEPARIN LEVEL (UNFRACTIONATED)
Heparin Unfractionated: 0.23 — ABNORMAL LOW
Heparin Unfractionated: 0.31
Heparin Unfractionated: 0.41
Heparin Unfractionated: 0.51

## 2011-05-28 LAB — TSH: TSH: 7.22 — ABNORMAL HIGH

## 2011-06-11 DIAGNOSIS — K922 Gastrointestinal hemorrhage, unspecified: Secondary | ICD-10-CM

## 2011-06-11 HISTORY — DX: Gastrointestinal hemorrhage, unspecified: K92.2

## 2011-06-23 ENCOUNTER — Ambulatory Visit (INDEPENDENT_AMBULATORY_CARE_PROVIDER_SITE_OTHER): Payer: Medicare Other | Admitting: *Deleted

## 2011-06-23 DIAGNOSIS — I4891 Unspecified atrial fibrillation: Secondary | ICD-10-CM

## 2011-06-24 ENCOUNTER — Encounter: Payer: Medicare Other | Admitting: *Deleted

## 2011-06-29 ENCOUNTER — Telehealth: Payer: Self-pay | Admitting: Cardiovascular Disease

## 2011-06-29 NOTE — Telephone Encounter (Signed)
Spoke with pt.  Had dark stool x 1.  After initial call, remembered he had beets for dinner the night before and those could cause a dark stool.  He has not noticed any more episodes.  No abdominal pain or history of GI bleed.  Have asked patient to monitor bowel movements over the next few days and if they remain dark, call the office again.

## 2011-06-29 NOTE — Telephone Encounter (Signed)
New message:  Pt had a bowel movement and it was very dark, is concerned that he might be bleeding. Pt is on coumadin and wanted to notify you.  Please call him back

## 2011-06-30 ENCOUNTER — Inpatient Hospital Stay (HOSPITAL_COMMUNITY)
Admission: EM | Admit: 2011-06-30 | Discharge: 2011-07-07 | DRG: 377 | Disposition: A | Discharge status: Home Health | Payer: Medicare Other | Attending: Internal Medicine | Admitting: Internal Medicine

## 2011-06-30 ENCOUNTER — Ambulatory Visit: Payer: Medicare Other | Admitting: *Deleted

## 2011-06-30 ENCOUNTER — Other Ambulatory Visit: Payer: Self-pay

## 2011-06-30 ENCOUNTER — Ambulatory Visit (INDEPENDENT_AMBULATORY_CARE_PROVIDER_SITE_OTHER): Payer: Medicare Other | Admitting: *Deleted

## 2011-06-30 ENCOUNTER — Encounter: Payer: Self-pay | Admitting: Emergency Medicine

## 2011-06-30 ENCOUNTER — Telehealth: Payer: Self-pay | Admitting: Nurse Practitioner

## 2011-06-30 DIAGNOSIS — D649 Anemia, unspecified: Secondary | ICD-10-CM

## 2011-06-30 DIAGNOSIS — I2510 Atherosclerotic heart disease of native coronary artery without angina pectoris: Secondary | ICD-10-CM

## 2011-06-30 DIAGNOSIS — D62 Acute posthemorrhagic anemia: Secondary | ICD-10-CM | POA: Diagnosis present

## 2011-06-30 DIAGNOSIS — M79673 Pain in unspecified foot: Secondary | ICD-10-CM | POA: Diagnosis not present

## 2011-06-30 DIAGNOSIS — K922 Gastrointestinal hemorrhage, unspecified: Secondary | ICD-10-CM

## 2011-06-30 DIAGNOSIS — Z7901 Long term (current) use of anticoagulants: Secondary | ICD-10-CM

## 2011-06-30 DIAGNOSIS — N183 Chronic kidney disease, stage 3 unspecified: Secondary | ICD-10-CM | POA: Diagnosis present

## 2011-06-30 DIAGNOSIS — R05 Cough: Secondary | ICD-10-CM | POA: Diagnosis not present

## 2011-06-30 DIAGNOSIS — K254 Chronic or unspecified gastric ulcer with hemorrhage: Principal | ICD-10-CM | POA: Diagnosis present

## 2011-06-30 DIAGNOSIS — I4891 Unspecified atrial fibrillation: Secondary | ICD-10-CM

## 2011-06-30 DIAGNOSIS — D689 Coagulation defect, unspecified: Secondary | ICD-10-CM | POA: Diagnosis present

## 2011-06-30 DIAGNOSIS — K219 Gastro-esophageal reflux disease without esophagitis: Secondary | ICD-10-CM | POA: Diagnosis present

## 2011-06-30 DIAGNOSIS — J189 Pneumonia, unspecified organism: Secondary | ICD-10-CM | POA: Diagnosis present

## 2011-06-30 DIAGNOSIS — R059 Cough, unspecified: Secondary | ICD-10-CM | POA: Diagnosis not present

## 2011-06-30 DIAGNOSIS — R791 Abnormal coagulation profile: Secondary | ICD-10-CM

## 2011-06-30 DIAGNOSIS — N179 Acute kidney failure, unspecified: Secondary | ICD-10-CM | POA: Diagnosis present

## 2011-06-30 DIAGNOSIS — I5033 Acute on chronic diastolic (congestive) heart failure: Secondary | ICD-10-CM | POA: Diagnosis not present

## 2011-06-30 DIAGNOSIS — Z880 Allergy status to penicillin: Secondary | ICD-10-CM

## 2011-06-30 DIAGNOSIS — Z79899 Other long term (current) drug therapy: Secondary | ICD-10-CM

## 2011-06-30 DIAGNOSIS — I129 Hypertensive chronic kidney disease with stage 1 through stage 4 chronic kidney disease, or unspecified chronic kidney disease: Secondary | ICD-10-CM | POA: Diagnosis present

## 2011-06-30 DIAGNOSIS — T45515A Adverse effect of anticoagulants, initial encounter: Secondary | ICD-10-CM | POA: Diagnosis present

## 2011-06-30 DIAGNOSIS — E7800 Pure hypercholesterolemia, unspecified: Secondary | ICD-10-CM | POA: Diagnosis present

## 2011-06-30 DIAGNOSIS — Z833 Family history of diabetes mellitus: Secondary | ICD-10-CM

## 2011-06-30 DIAGNOSIS — I959 Hypotension, unspecified: Secondary | ICD-10-CM | POA: Diagnosis present

## 2011-06-30 DIAGNOSIS — E039 Hypothyroidism, unspecified: Secondary | ICD-10-CM | POA: Diagnosis present

## 2011-06-30 DIAGNOSIS — Z951 Presence of aortocoronary bypass graft: Secondary | ICD-10-CM

## 2011-06-30 HISTORY — DX: Atherosclerotic heart disease of native coronary artery without angina pectoris: I25.10

## 2011-06-30 HISTORY — DX: Hypothyroidism, unspecified: E03.9

## 2011-06-30 HISTORY — DX: Cardiac murmur, unspecified: R01.1

## 2011-06-30 HISTORY — DX: Peripheral vascular disease, unspecified: I73.9

## 2011-06-30 HISTORY — DX: Essential (primary) hypertension: I10

## 2011-06-30 HISTORY — DX: Multiple fractures of ribs, unspecified side, initial encounter for closed fracture: S22.49XA

## 2011-06-30 HISTORY — DX: Encounter for other specified aftercare: Z51.89

## 2011-06-30 HISTORY — DX: Fracture of one rib, unspecified side, initial encounter for closed fracture: S22.39XA

## 2011-06-30 HISTORY — DX: Unspecified osteoarthritis, unspecified site: M19.90

## 2011-06-30 HISTORY — DX: Anemia, unspecified: D64.9

## 2011-06-30 HISTORY — DX: Allergy status to unspecified drugs, medicaments and biological substances: Z88.9

## 2011-06-30 HISTORY — DX: Cardiac arrhythmia, unspecified: I49.9

## 2011-06-30 HISTORY — DX: Pure hypercholesterolemia, unspecified: E78.00

## 2011-06-30 HISTORY — DX: Chronic kidney disease, unspecified: N18.9

## 2011-06-30 HISTORY — DX: Reserved for inherently not codable concepts without codable children: IMO0001

## 2011-06-30 HISTORY — DX: Pneumonia, unspecified organism: J18.9

## 2011-06-30 HISTORY — DX: Headache: R51

## 2011-06-30 HISTORY — DX: Fracture of unspecified parts of lumbosacral spine and pelvis, initial encounter for closed fracture: S32.9XXA

## 2011-06-30 HISTORY — DX: Acute myocardial infarction, unspecified: I21.9

## 2011-06-30 HISTORY — DX: Gastro-esophageal reflux disease without esophagitis: K21.9

## 2011-06-30 HISTORY — DX: Angina pectoris, unspecified: I20.9

## 2011-06-30 LAB — CBC WITH DIFFERENTIAL/PLATELET
Basophils Relative: 1 % (ref 0–1)
Eosinophils Absolute: 0.1 10*3/uL (ref 0.0–0.7)
Eosinophils Relative: 1 % (ref 0–5)
Lymphs Abs: 1.6 10*3/uL (ref 0.7–4.0)
MCH: 30.6 pg (ref 26.0–34.0)
MCHC: 33 g/dL (ref 30.0–36.0)
MCV: 92.8 fL (ref 78.0–100.0)
Neutrophils Relative %: 75 % (ref 43–77)
Platelets: 197 10*3/uL (ref 150–400)
RBC: 2.29 MIL/uL — ABNORMAL LOW (ref 4.22–5.81)
RDW: 13.5 % (ref 11.5–15.5)

## 2011-06-30 LAB — DIFFERENTIAL
Lymphs Abs: 2.3 10*3/uL (ref 0.7–4.0)
Monocytes Absolute: 1 10*3/uL (ref 0.1–1.0)
Monocytes Relative: 10 % (ref 3–12)
Neutro Abs: 6.6 10*3/uL (ref 1.7–7.7)
Neutrophils Relative %: 66 % (ref 43–77)

## 2011-06-30 LAB — COMPREHENSIVE METABOLIC PANEL
Alkaline Phosphatase: 39 U/L (ref 39–117)
BUN: 110 mg/dL — ABNORMAL HIGH (ref 6–23)
CO2: 25 mEq/L (ref 19–32)
Chloride: 101 mEq/L (ref 96–112)
Creatinine, Ser: 3.21 mg/dL — ABNORMAL HIGH (ref 0.50–1.35)
GFR calc Af Amer: 18 mL/min — ABNORMAL LOW (ref >90)
GFR calc non Af Amer: 16 mL/min — ABNORMAL LOW (ref >90)
Glucose, Bld: 151 mg/dL — ABNORMAL HIGH (ref 70–99)
Potassium: 4 mEq/L (ref 3.5–5.1)
Total Bilirubin: 0.1 mg/dL — ABNORMAL LOW (ref 0.3–1.2)

## 2011-06-30 LAB — PROTIME-INR
INR: 3.95 — ABNORMAL HIGH (ref 0.00–1.49)
Prothrombin Time: 39.2 seconds — ABNORMAL HIGH (ref 11.6–15.2)

## 2011-06-30 LAB — OCCULT BLOOD, POC DEVICE: Fecal Occult Bld: POSITIVE

## 2011-06-30 LAB — CBC
HCT: 19.5 % — ABNORMAL LOW (ref 39.0–52.0)
Hemoglobin: 6.7 g/dL — CL (ref 13.0–17.0)
MCHC: 34.4 g/dL (ref 30.0–36.0)
RBC: 2.15 MIL/uL — ABNORMAL LOW (ref 4.22–5.81)

## 2011-06-30 MED ORDER — SODIUM CHLORIDE 0.9 % IV BOLUS (SEPSIS)
1000.0000 mL | Freq: Once | INTRAVENOUS | Status: AC
  Administered 2011-07-01: 1000 mL via INTRAVENOUS

## 2011-06-30 NOTE — Telephone Encounter (Signed)
Received call from [facility] labs this evening with report of CBC for [name] - h/h: 7/20.  Called pt who reported feeling dizzy with standing as well as weak.  Rec that pt come into ER tonight for eval, coumadin reversal, blood transfusion, and GI Eval.  Pt verbalized understanding and plans to come into [facility] tonight.

## 2011-06-30 NOTE — ED Provider Notes (Signed)
History     CSN: [id number] Arrival date & time: 06/30/2011  9:03 PM   First MD Initiated Contact with Patient 06/30/11 2259      Chief Complaint  Patient presents with  . Rectal Bleeding    (Consider location/radiation/quality/duration/timing/severity/associated sxs/prior treatment) Patient is a 77 y.o. male presenting with hematochezia. The history is provided by the patient.  Rectal Bleeding  The current episode started 2 days ago. The problem occurs frequently. The problem has been gradually worsening. The patient is experiencing no pain. The stool is described as soft (black). There was no prior successful therapy. There was no prior unsuccessful therapy. Pertinent negatives include no anorexia, no fever, no abdominal pain, no hematemesis, no nausea, no rectal pain, no chest pain, no coughing and no difficulty breathing. He has been eating and drinking normally. Urine output has been normal. His past medical history does not include recent abdominal injury, recent antibiotic use or a recent illness. There were no sick contacts. Recently, medical care has been given by the PCP. Services received include tests performed.    Past Medical History  Diagnosis Date  . Hypertension   . Coronary artery disease   . Hypothyroidism   . Hypercholesterolemia     Past Surgical History  Procedure Date  . Coronary artery bypass graft     No family history on file.  History  Substance Use Topics  . Smoking status: Never Smoker   . Smokeless tobacco: Not on file  . Alcohol Use: No      Review of Systems  Constitutional: Positive for fatigue. Negative for fever.  Respiratory: Negative for cough and chest tightness.   Cardiovascular: Negative for chest pain.  Gastrointestinal: Positive for hematochezia. Negative for nausea, abdominal pain, rectal pain, anorexia and hematemesis.  Neurological: Positive for dizziness, weakness and light-headedness.  All other systems reviewed and are  negative.    Allergies  Review of patient's allergies indicates no known allergies.  Home Medications   Current Outpatient Rx  Name Route Sig Dispense Refill  . METOPROLOL SUCCINATE 25 MG PO TB24 Oral Take 1 tablet (25 mg total) by mouth daily. 15 tablet 1  . SIMVASTATIN 20 MG PO TABS Oral Take 1 tablet by mouth Daily.    Marland Kitchen SYNTHROID 75 MCG PO TABS Oral Take 1 tablet by mouth Daily.      BP 111/43  Pulse 110  Temp(Src) 97.9 F (36.6 C) (Oral)  Resp 18  SpO2 96%  Physical Exam  Nursing note and vitals reviewed. Constitutional: He is oriented to person, place, and time. He appears well-developed and well-nourished. No distress.  HENT:  Head: Normocephalic and atraumatic.  Mouth/Throat: Mucous membranes are dry.  Eyes: Conjunctivae and EOM are normal. Pupils are equal, round, and reactive to light.  Neck: Normal range of motion. Neck supple.  Cardiovascular: Normal rate, regular rhythm and intact distal pulses.   No murmur heard. Pulmonary/Chest: Effort normal and breath sounds normal. No respiratory distress. He has no wheezes. He has no rales.  Abdominal: Soft. He exhibits no distension. There is no tenderness. There is no rebound and no guarding.  Musculoskeletal: Normal range of motion. He exhibits no edema and no tenderness.  Neurological: He is alert and oriented to person, place, and time.  Skin: Skin is warm and dry. No rash noted. No erythema. There is pallor.  Psychiatric: He has a normal mood and affect. His behavior is normal.    ED Course  Procedures (including critical care time)  Results for orders placed during the hospital encounter of 06/30/11  TYPE AND SCREEN      Component Value Range   ABO/RH(D) A POS     Antibody Screen NEG     Sample Expiration 07/03/2011     Unit Number [id number]     Blood Component Type RED CELLS,LR     Unit division 00     Status of Unit ISSUED     Transfusion Status OK TO TRANSFUSE     Crossmatch Result Compatible      Unit Number [id number]     Blood Component Type RED CELLS,LR     Unit division 00     Status of Unit ALLOCATED     Transfusion Status OK TO TRANSFUSE     Crossmatch Result Compatible    CBC      Component Value Range   WBC 10.0  4.0 - 10.5 (K/uL)   RBC 2.15 (*) 4.22 - 5.81 (MIL/uL)   Hemoglobin 6.7 (*) 13.0 - 17.0 (g/dL)   HCT 19.5 (*) 39.0 - 52.0 (%)   MCV 90.7  78.0 - 100.0 (fL)   MCH 31.2  26.0 - 34.0 (pg)   MCHC 34.4  30.0 - 36.0 (g/dL)   RDW 13.6  11.5 - 15.5 (%)   Platelets 177  150 - 400 (K/uL)  DIFFERENTIAL      Component Value Range   Neutrophils Relative 66  43 - 77 (%)   Neutro Abs 6.6  1.7 - 7.7 (K/uL)   Lymphocytes Relative 23  12 - 46 (%)   Lymphs Abs 2.3  0.7 - 4.0 (K/uL)   Monocytes Relative 10  3 - 12 (%)   Monocytes Absolute 1.0  0.1 - 1.0 (K/uL)   Eosinophils Relative 1  0 - 5 (%)   Eosinophils Absolute 0.1  0.0 - 0.7 (K/uL)   Basophils Relative 1  0 - 1 (%)   Basophils Absolute 0.1  0.0 - 0.1 (K/uL)  COMPREHENSIVE METABOLIC PANEL      Component Value Range   Sodium 136  135 - 145 (mEq/L)   Potassium 4.0  3.5 - 5.1 (mEq/L)   Chloride 101  96 - 112 (mEq/L)   CO2 25  19 - 32 (mEq/L)   Glucose, Bld 151 (*) 70 - 99 (mg/dL)   BUN 110 (*) 6 - 23 (mg/dL)   Creatinine, Ser 3.21 (*) 0.50 - 1.35 (mg/dL)   Calcium 8.6  8.4 - 10.5 (mg/dL)   Total Protein 5.7 (*) 6.0 - 8.3 (g/dL)   Albumin 3.1 (*) 3.5 - 5.2 (g/dL)   AST 14  0 - 37 (U/L)   ALT 10  0 - 53 (U/L)   Alkaline Phosphatase 39  39 - 117 (U/L)   Total Bilirubin 0.1 (*) 0.3 - 1.2 (mg/dL)   GFR calc non Af Amer 16 (*) >90 (mL/min)   GFR calc Af Amer 18 (*) >90 (mL/min)  PROTIME-INR      Component Value Range   Prothrombin Time 39.2 (*) 11.6 - 15.2 (seconds)   INR 3.95 (*) 0.00 - 1.49   APTT      Component Value Range   aPTT 48 (*) 24 - 37 (seconds)  PREPARE RBC (CROSSMATCH)      Component Value Range   Order Confirmation ORDER PROCESSED BY BLOOD BANK    OCCULT BLOOD, POC DEVICE      Component Value  Range   Fecal Occult Bld POSITIVE     CRITICAL CARE  Performed by: Blanchie Dessert   Total critical care time: 30  Critical care time was exclusive of separately billable procedures and treating other patients.  Critical care was necessary to treat or prevent imminent or life-threatening deterioration.  Critical care was time spent personally by me on the following activities: development of treatment plan with patient and/or surrogate as well as nursing, discussions with consultants, evaluation of patient's response to treatment, examination of patient, obtaining history from patient or surrogate, ordering and performing treatments and interventions, ordering and review of laboratory studies, ordering and review of radiographic studies, pulse oximetry and re-evaluation of patient's condition.    Date: 06/30/2011  Rate: 77 Rhythm: atrial fibrillation  QRS Axis: normal  Intervals: normal  ST/T Wave abnormalities: normal  Conduction Disutrbances:nonspecific intraventricular conduction delay  Narrative Interpretation:   Old EKG Reviewed: unchanged    1. GI bleeding   2. Anemia   3. Supratherapeutic INR       MDM   Pt with black stools and fatigue and lightheadedness on coumadin for intermittent a.fib.  Who today has a Hb of 6.7 and new RI of 3.2 from 1.  Hemoccult positive.  INR from clinic today was 3.8.  Will give 2units of PRBCs and o/w stable and admit to triad.  Pt states he has also been taking lasix which would also add to his elevated cr.  No hx of renal issues in the past and no signs of fluid overload.  BP stable.  11:48 PM INR 3.9 here.  EKG unchanged.  Will admit.       Blanchie Dessert, MD 07/01/11 [phone]

## 2011-06-30 NOTE — Telephone Encounter (Signed)
Spoke with pt's wife.  Pt has had 3 more episodes of dark stools today and is feeling weak.  Offered to check his INR and CBC in office today but suggested that he call Dr. Doristine Devoid office for visit to evaluate.  Pt's wife aware.

## 2011-06-30 NOTE — ED Notes (Signed)
PT. REPORTS DARK BLACK STOOLS X 2 DAYS ,  SEEN AT [place] / BLOOD TEST DONE , ADVISED TO GO TO ER FOR TRANSFUSION DUE TO LOW HGB.

## 2011-07-01 ENCOUNTER — Encounter (HOSPITAL_COMMUNITY): Payer: Self-pay | Admitting: Internal Medicine

## 2011-07-01 ENCOUNTER — Other Ambulatory Visit: Payer: Self-pay | Admitting: Internal Medicine

## 2011-07-01 ENCOUNTER — Encounter (HOSPITAL_COMMUNITY)
Admission: EM | Disposition: A | Discharge status: Home Health | Payer: Self-pay | Source: Home / Self Care | Attending: Internal Medicine

## 2011-07-01 DIAGNOSIS — D62 Acute posthemorrhagic anemia: Secondary | ICD-10-CM

## 2011-07-01 DIAGNOSIS — K922 Gastrointestinal hemorrhage, unspecified: Secondary | ICD-10-CM

## 2011-07-01 DIAGNOSIS — D689 Coagulation defect, unspecified: Secondary | ICD-10-CM | POA: Diagnosis present

## 2011-07-01 DIAGNOSIS — N179 Acute kidney failure, unspecified: Secondary | ICD-10-CM | POA: Diagnosis present

## 2011-07-01 DIAGNOSIS — I959 Hypotension, unspecified: Secondary | ICD-10-CM | POA: Diagnosis present

## 2011-07-01 DIAGNOSIS — K250 Acute gastric ulcer with hemorrhage: Secondary | ICD-10-CM

## 2011-07-01 DIAGNOSIS — I2510 Atherosclerotic heart disease of native coronary artery without angina pectoris: Secondary | ICD-10-CM | POA: Diagnosis present

## 2011-07-01 HISTORY — PX: ESOPHAGOGASTRODUODENOSCOPY: SHX5428

## 2011-07-01 LAB — COMPREHENSIVE METABOLIC PANEL
Albumin: 2.3 g/dL — ABNORMAL LOW (ref 3.5–5.2)
BUN: 101 mg/dL — ABNORMAL HIGH (ref 6–23)
Chloride: 110 mEq/L (ref 96–112)
Creatinine, Ser: 2.74 mg/dL — ABNORMAL HIGH (ref 0.50–1.35)
GFR calc Af Amer: 22 mL/min — ABNORMAL LOW (ref >90)
GFR calc non Af Amer: 19 mL/min — ABNORMAL LOW (ref >90)
Glucose, Bld: 96 mg/dL (ref 70–99)
Total Bilirubin: 0.7 mg/dL (ref 0.3–1.2)

## 2011-07-01 LAB — GLUCOSE, CAPILLARY: Glucose-Capillary: 98 mg/dL (ref 70–99)

## 2011-07-01 LAB — PREPARE RBC (CROSSMATCH)

## 2011-07-01 LAB — CBC
MCV: 87.2 fL (ref 78.0–100.0)
Platelets: 110 10*3/uL — ABNORMAL LOW (ref 150–400)
RBC: 2.26 MIL/uL — ABNORMAL LOW (ref 4.22–5.81)
RDW: 14.8 % (ref 11.5–15.5)
WBC: 9.3 10*3/uL (ref 4.0–10.5)

## 2011-07-01 LAB — PROTIME-INR: Prothrombin Time: 41.9 seconds — ABNORMAL HIGH (ref 11.6–15.2)

## 2011-07-01 LAB — HEMOGLOBIN AND HEMATOCRIT, BLOOD
HCT: 18.7 % — ABNORMAL LOW (ref 39.0–52.0)
Hemoglobin: 6.6 g/dL — CL (ref 13.0–17.0)

## 2011-07-01 LAB — APTT: aPTT: 43 seconds — ABNORMAL HIGH (ref 24–37)

## 2011-07-01 LAB — PRO B NATRIURETIC PEPTIDE: Pro B Natriuretic peptide (BNP): 631.8 pg/mL — ABNORMAL HIGH (ref 0–450)

## 2011-07-01 SURGERY — EGD (ESOPHAGOGASTRODUODENOSCOPY)
Anesthesia: Moderate Sedation

## 2011-07-01 MED ORDER — SODIUM CHLORIDE 0.9 % IV SOLN: INTRAVENOUS | Status: DC

## 2011-07-01 MED ORDER — FUROSEMIDE 10 MG/ML IJ SOLN: 40.0000 mg | Freq: Once | INTRAMUSCULAR | Status: DC

## 2011-07-01 MED ORDER — FUROSEMIDE 10 MG/ML IJ SOLN: 20.0000 mg | Freq: Once | INTRAMUSCULAR | Status: DC

## 2011-07-01 MED ORDER — MIDAZOLAM HCL 10 MG/2ML IJ SOLN
INTRAMUSCULAR | Status: DC | PRN
  Administered 2011-07-01: 2 mg via INTRAVENOUS

## 2011-07-01 MED ORDER — MUPIROCIN 2 % EX OINT
1.0000 "application " | TOPICAL_OINTMENT | Freq: Two times a day (BID) | CUTANEOUS | Status: AC
  Administered 2011-07-01 – 2011-07-05 (×10): 1 via NASAL
  Filled 2011-07-01: qty 22

## 2011-07-01 MED ORDER — FUROSEMIDE 10 MG/ML IJ SOLN
20.0000 mg | Freq: Once | INTRAMUSCULAR | Status: AC
  Administered 2011-07-01: 20 mg via INTRAVENOUS

## 2011-07-01 MED ORDER — SODIUM CHLORIDE 0.9 % IV SOLN
INTRAVENOUS | Status: DC
  Administered 2011-07-01 – 2011-07-02 (×2): via INTRAVENOUS
  Administered 2011-07-03: 100 mL/h via INTRAVENOUS
  Administered 2011-07-04 – 2011-07-06 (×4): via INTRAVENOUS

## 2011-07-01 MED ORDER — CHLORHEXIDINE GLUCONATE CLOTH 2 % EX PADS
6.0000 | MEDICATED_PAD | Freq: Every day | CUTANEOUS | Status: DC
  Administered 2011-07-02: 6 via TOPICAL

## 2011-07-01 MED ORDER — FUROSEMIDE 10 MG/ML IJ SOLN
20.0000 mg | INTRAMUSCULAR | Status: AC
  Administered 2011-07-01 (×2): 20 mg via INTRAVENOUS
  Filled 2011-07-01: qty 2

## 2011-07-01 MED ORDER — FENTANYL CITRATE 0.05 MG/ML IJ SOLN
INTRAMUSCULAR | Status: AC
  Filled 2011-07-01: qty 2

## 2011-07-01 MED ORDER — SODIUM CHLORIDE 0.9 % IV SOLN
INTRAVENOUS | Status: DC
  Administered 2011-07-01: 17:00:00 via INTRAVENOUS

## 2011-07-01 MED ORDER — FUROSEMIDE 10 MG/ML IJ SOLN
40.0000 mg | Freq: Once | INTRAMUSCULAR | Status: AC
  Administered 2011-07-01: 40 mg via INTRAVENOUS
  Filled 2011-07-01: qty 4

## 2011-07-01 MED ORDER — METOPROLOL SUCCINATE ER 25 MG PO TB24
25.0000 mg | ORAL_TABLET | Freq: Every day | ORAL | Status: DC
  Filled 2011-07-01: qty 1

## 2011-07-01 MED ORDER — SODIUM CHLORIDE 0.9 % IV SOLN
INTRAVENOUS | Status: DC
  Administered 2011-07-01: 10:00:00 via INTRAVENOUS

## 2011-07-01 MED ORDER — SODIUM CHLORIDE 0.9 % IV SOLN
8.0000 mg/h | INTRAVENOUS | Status: DC
  Administered 2011-07-01 – 2011-07-04 (×6): 8 mg/h via INTRAVENOUS
  Filled 2011-07-01 (×16): qty 80

## 2011-07-01 MED ORDER — SIMVASTATIN 20 MG PO TABS
20.0000 mg | ORAL_TABLET | Freq: Every day | ORAL | Status: DC
  Filled 2011-07-01: qty 1

## 2011-07-01 MED ORDER — FENTANYL CITRATE 0.05 MG/ML IJ SOLN
INTRAMUSCULAR | Status: DC | PRN
  Administered 2011-07-01: 25 ug via INTRAVENOUS

## 2011-07-01 MED ORDER — PHYTONADIONE 5 MG PO TABS
5.0000 mg | ORAL_TABLET | Freq: Once | ORAL | Status: AC
  Administered 2011-07-01: 5 mg via ORAL
  Filled 2011-07-01: qty 1

## 2011-07-01 MED ORDER — FUROSEMIDE 10 MG/ML IJ SOLN
INTRAMUSCULAR | Status: AC
  Filled 2011-07-01: qty 4

## 2011-07-01 MED ORDER — FUROSEMIDE 10 MG/ML IJ SOLN
INTRAMUSCULAR | Status: AC
  Administered 2011-07-01: 20 mg via INTRAVENOUS
  Filled 2011-07-01: qty 4

## 2011-07-01 MED ORDER — LEVOTHYROXINE SODIUM 75 MCG PO TABS
75.0000 ug | ORAL_TABLET | Freq: Every day | ORAL | Status: DC
  Administered 2011-07-01 – 2011-07-07 (×7): 75 ug via ORAL
  Filled 2011-07-01 (×8): qty 1

## 2011-07-01 MED ORDER — MIDAZOLAM HCL 10 MG/2ML IJ SOLN
INTRAMUSCULAR | Status: AC
  Filled 2011-07-01: qty 2

## 2011-07-01 MED ORDER — PANTOPRAZOLE SODIUM 40 MG IV SOLR
80.0000 mg | Freq: Once | INTRAVENOUS | Status: AC
  Administered 2011-07-01: 80 mg via INTRAVENOUS
  Filled 2011-07-01: qty 80

## 2011-07-01 MED ORDER — PANTOPRAZOLE SODIUM 40 MG IV SOLR
40.0000 mg | INTRAVENOUS | Status: DC
  Filled 2011-07-01: qty 40

## 2011-07-01 MED ORDER — EPINEPHRINE HCL 0.1 MG/ML IJ SOLN
INTRAMUSCULAR | Status: AC
  Filled 2011-07-01: qty 10

## 2011-07-01 MED ORDER — SODIUM CHLORIDE 0.9 % IJ SOLN
INTRAMUSCULAR | Status: DC | PRN
  Administered 2011-07-01: 13:00:00

## 2011-07-01 MED ORDER — SODIUM CHLORIDE 0.9 % IV SOLN
INTRAVENOUS | Status: AC
  Administered 2011-07-01: 02:00:00 via INTRAVENOUS

## 2011-07-01 NOTE — Progress Notes (Signed)
Subjective: Patient seen early this morning, complains of feeling mildly dizzy. He denies any chest pain or shortness of breath. Nursing and the patient had both reported no further bloody or black bowel movements.  Objective: Weight change:   Intake/Output Summary (Last 24 hours) at 07/01/11 1757 Last data filed at 07/01/11 1715  Gross per 24 hour  Intake 3324.92 ml  Output   3940 ml  Net -615.08 ml   BP 107/44  Pulse 61  Temp(Src) 98.1 F (36.7 C) (Oral)  Resp 16  Ht 5\' 5"  (1.651 m)  Wt 75.7 kg (166 lb 14.2 oz)  BMI 27.77 kg/m2  SpO2 99% General appearance: alert, cooperative, appears stated age, fatigued and no distress Head: Normocephalic, without obvious abnormality, atraumatic, Mucous membranes are dry Lungs: clear to auscultation bilaterally Heart: Irregular rhythm, rate controlled, 2/6 systolic ejection murmur Abdomen: soft, non-tender; bowel sounds normal; no masses,  no organomegaly Extremities: No clubbing or cyanosis, 1 peripheral pulses, trace pitting edema  Lab Results: Basic Metabolic Panel:  Basename 07/01/11 0730 06/30/11 2158  NA 139 136  K 4.2 4.0  CL 110 101  CO2 21 25  GLUCOSE 96 151*  BUN 101* 110*  CREATININE 2.74* 3.21*  CALCIUM 7.5* 8.6  MG -- --  PHOS -- --   Liver Function Tests:  [facility] 07/01/11 0730 06/30/11 2158  AST 11 14  ALT 7 10  ALKPHOS 28* 39  BILITOT 0.7 0.1*  PROT 4.3* 5.7*  ALBUMIN 2.3* 3.1*   CBC:  Basename 07/01/11 1550 07/01/11 0730 06/30/11 2158 06/30/11 1534  WBC -- 9.3 10.0 --  NEUTROABS -- -- 6.6 7.3  HGB 6.6* 6.9* -- --  HCT 18.7* 19.7* -- --  MCV -- 87.2 90.7 --  PLT -- 110* 177 --   BNP:  Basename 07/01/11 0953  POCBNP 631.8*   Coagulation:  Basename 07/01/11 0730 06/30/11 2304  LABPROT 41.9* 39.2*  INR 4.30* 3.95*    Studies/Results: No results found. Medications: Scheduled Meds:   . sodium chloride   Intravenous STAT  . Chlorhexidine Gluconate Cloth  6 each Topical Q0600  .  furosemide      . furosemide  20 mg Intravenous Q4H  . furosemide  20 mg Intravenous Once  . furosemide  20 mg Intravenous Once  . furosemide  40 mg Intravenous Once  . furosemide  40 mg Intravenous Once  . levothyroxine  75 mcg Oral QAC breakfast  . mupirocin ointment  1 application Nasal BID  . pantoprazole (PROTONIX) IV  80 mg Intravenous Once  . phytonadione  5 mg Oral Once  . sodium chloride  1,000 mL Intravenous Once  . DISCONTD: furosemide  20 mg Intravenous Once  . DISCONTD: furosemide  20 mg Intravenous Once  . DISCONTD: furosemide  20 mg Intravenous Once  . DISCONTD: furosemide  20 mg Intravenous Once  . DISCONTD: furosemide  20 mg Intravenous Once  . DISCONTD: furosemide  20 mg Intravenous Once  . DISCONTD: metoprolol succinate  25 mg Oral Daily  . DISCONTD: pantoprazole (PROTONIX) IV  40 mg Intravenous Q24H  . DISCONTD: simvastatin  20 mg Oral q1800   Continuous Infusions:   . sodium chloride Stopped (07/01/11 0940)  . sodium chloride 10 mL/hr at 07/01/11 0940  . sodium chloride Stopped (07/01/11 1700)  . sodium chloride 5 mL/hr at 07/01/11 1500  . pantoprozole (PROTONIX) infusion 8 mg/hr (07/01/11 1720)   PRN Meds:.DISCONTD: EPINEPHrine 1:10,000, 10 mL syringe/NS, 10 mL vial for sclerotherapy inj mixture, DISCONTD:  fentaNYL, DISCONTD: midazolam  Assessment/Plan: Patient Active Hospital Problem List: GIB (gastrointestinal bleeding) (07/01/2011)  n.p.o., IV PPI. Status post 2 units packed red blood cells and his hemoglobin went from 6.9 down to 6.7. He then received 2 more units packed red blood cells and his hemoglobin did not improve. I've ordered 2 more units of packed red blood cells. Appreciate gastroenterology help. Patient status post EGD noting nonbleeding ulcer which was injected. Continue step down, continue to monitor. Continue n.p.o.  Other and unspecified coagulation defects (07/01/2011)  patient has been on Coumadin, and I suspected that after his dose of  vitamin K and affect his INR went up he may be volume overloaded. BNP only minimally elevated this may be in part to renal failure as well as some mild volume overload. Given doses of IV Lasix and will followup on on the morning. If patient ever cleared him to be on anticoagulants which he may not be, would consider pradaxa.  ARF (acute renal failure) (07/01/2011)  secondary GI bleed, improving.  Atrial fibrillation (11/10/2010)  rate controlled currently. Holding anticoagulant.  Hypotension (arterial) (07/01/2011)  improved with IV fluids and blood transfusion.  ASCVD (arteriosclerotic cardiovascular disease) (07/01/2011)  stable at this time.    LOS: 1 day   [name] 07/01/2011, 5:57 PM

## 2011-07-01 NOTE — Consult Note (Signed)
I have reviewed the above note, examined the patient and agree with plan of treatment. Hemodynamically significant UGI bleed, .currently receiving FFP to reverse the coagulopathy, suspect benign ulcer or avm, will proceed with EGD. Cont PPI, bowl rest, reconsider long term anticoagulation risks and benefits.

## 2011-07-01 NOTE — ED Notes (Signed)
Admitting MD at bedside.

## 2011-07-01 NOTE — Progress Notes (Signed)
UR review completed. 

## 2011-07-01 NOTE — ED Notes (Signed)
Blood product transfusion complete and discontinued.

## 2011-07-01 NOTE — ED Notes (Signed)
Pt daughters phone number for updates [name] [phone]

## 2011-07-01 NOTE — Progress Notes (Signed)
   07/01/11 12:03 Tomi Bamberger RN, BSN [phone] Patient lives with spouse, pta independent.  PCP [place], NCM will continue to follow for dc needs.

## 2011-07-01 NOTE — Op Note (Signed)
EGD today showed a small prepyloric ulcer with attached blood clot, but no active bleeding. There was no blood in the stomach. We elected not to disturb the clot and injected Epinephrine total of 3 cc's circumferentially around the ulcer, resulting in blanching of the tissue CLO test was taken from gastric body to r/o H.Pylori. Please see EGD report under Procedures.

## 2011-07-01 NOTE — H&P (Signed)
[name] is an 77 y.o. male.   Chief Complaint: Black Bowel Movements HPI: pt is an 77 yr old male who states that on the nineteenth he had a bloody bowel movement.  He is on coumadin, so he immediately called his cardiologist's office and told the nurse about the problem.  He then remembered having beets the day before and thought that maybe the problem was related to that.  In any case the following morning he continue to have the black bowel movements, and he began to feel dizzy and weak,  so he called back.  The patient went in for lab work and it was discovered that his h/h was 7/20 and his INR was 3.8.  He was told to come to the ED.  The patient has never had a problem like this before.  He denies, SOB, chest pain, palpitations, headaches or near syncope.  He denies fevers, but admits to chills.   He denies nausea, vomiting, or abdominal pain.  He states that he has felt unusually gassy and any abdominal discomfort he feels is easily relieved by belching in the last two days.  He states that usually his INR is well controlled.  However, lately it has been running high.  He frequently has epistaxis, but states that that too has been more frequent lately.  Past Medical History  Diagnosis Date  . Hypertension   . Coronary artery disease   . Hypothyroidism   . Hypercholesterolemia   . Heart murmur   . Peripheral vascular disease   Atrial fibrillation Chronic anticoagulation Carotid Stenosis   Past Surgical History  Procedure Date  . Coronary artery bypass graft   . Removal of atrial myxoma   . Hernia repair   . Knee surgery   . Rotator cuff repair     Family History  Problem Relation Age of Onset  . Diabetes Other    Social History:  reports that he has never smoked. He does not have any smokeless tobacco history on file. He reports that he does not drink alcohol or use illicit drugs. Medications Prior to Admission  Medication Dose Route Frequency Provider Last Rate  Last Dose  . 0.9 %  sodium chloride infusion   Intravenous STAT Blanchie Dessert, MD      . phytonadione (VITAMIN K) tablet 5 mg  5 mg Oral Once Orit Sanville, DO      . sodium chloride 0.9 % bolus 1,000 mL  1,000 mL Intravenous Once Blanchie Dessert, MD   1,000 mL at 07/01/11 0017   Medications Prior to Admission  Medication Sig Dispense Refill  . metoprolol succinate (TOPROL XL) 25 MG 24 hr tablet Take 1 tablet (25 mg total) by mouth daily.  15 tablet  1  . simvastatin (ZOCOR) 20 MG tablet Take 1 tablet by mouth Daily.      Marland Kitchen SYNTHROID 75 MCG tablet Take 1 tablet by mouth Daily.        Allergies:  Allergies  Allergen Reactions  . Penicillins Rash    Constitutional: positive for chills and fatigue, negative for fevers, night sweats and weight loss Eyes: negative for icterus, irritation and redness Ears, nose, mouth, throat, and face: positive for rhinorhea Respiratory: positive for dyspnea on exertion and occasional cough, negative for pleurisy/chest pain, sputum and wheezing Cardiovascular: negative for chest pain, chest pressure/discomfort, exertional chest pressure/discomfort and irregular heart beat Gastrointestinal: positive for melena and belching, negative for abdominal pain, constipation, diarrhea, nausea and vomiting Genitourinary:negative for dysuria,  frequency and hematuria Integument/breast: negative for pruritus, rash and skin color change Hematologic/lymphatic: positive for bleeding, negative for lymphadenopathy and petechiae Musculoskeletal:negative for arthralgias, muscle weakness and stiff joints Neurological: negative for gait problems, seizures, tremors and positive for dizziness Behavioral/Psych: negative for anxiety, depression and irritability   General appearance: alert, cooperative, appears stated age, fatigued, no distress and pale Head: Normocephalic, without obvious abnormality, atraumatic Eyes: conjunctivae/corneas clear. PERRL, EOM's intact. Fundi  benign. Throat: lips, mucosa, and tongue normal; teeth and gums normal Neck: no adenopathy, no carotid bruit, no JVD, supple, symmetrical, trachea midline and thyroid not enlarged, symmetric, no tenderness/mass/nodules Resp: clear to auscultation bilaterally and normal percussion bilaterally Chest wall: no tenderness Cardio: normal apical impulse and systolic murmur: systolic ejection 2/6, blowing radiates to carotids GI: soft, non-tender; bowel sounds normal; no masses,  no organomegaly and tympanic Extremities: extremities normal, atraumatic, no cyanosis or edema and Homans sign is negative, no sign of DVT Pulses: 2+ and symmetric Skin: Skin color, texture, turgor normal. No rashes or lesions Lymph nodes: Cervical, supraclavicular, and axillary nodes normal. Neurologic: Alert and oriented X 3, normal strength and tone. Normal symmetric reflexes. Normal coordination and gait   Results for orders placed during the hospital encounter of 06/30/11 (from the past 48 hour(s))  CBC     Status: Abnormal   Collection Time   06/30/11  9:58 PM      Component Value Range Comment   WBC 10.0  4.0 - 10.5 (K/uL)    RBC 2.15 (*) 4.22 - 5.81 (MIL/uL)    Hemoglobin 6.7 (*) 13.0 - 17.0 (g/dL)    HCT 19.5 (*) 39.0 - 52.0 (%)    MCV 90.7  78.0 - 100.0 (fL)    MCH 31.2  26.0 - 34.0 (pg)    MCHC 34.4  30.0 - 36.0 (g/dL)    RDW 13.6  11.5 - 15.5 (%)    Platelets 177  150 - 400 (K/uL)   DIFFERENTIAL     Status: Normal   Collection Time   06/30/11  9:58 PM      Component Value Range Comment   Neutrophils Relative 66  43 - 77 (%)    Neutro Abs 6.6  1.7 - 7.7 (K/uL)    Lymphocytes Relative 23  12 - 46 (%)    Lymphs Abs 2.3  0.7 - 4.0 (K/uL)    Monocytes Relative 10  3 - 12 (%)    Monocytes Absolute 1.0  0.1 - 1.0 (K/uL)    Eosinophils Relative 1  0 - 5 (%)    Eosinophils Absolute 0.1  0.0 - 0.7 (K/uL)    Basophils Relative 1  0 - 1 (%)    Basophils Absolute 0.1  0.0 - 0.1 (K/uL)   COMPREHENSIVE  METABOLIC PANEL     Status: Abnormal   Collection Time   06/30/11  9:58 PM      Component Value Range Comment   Sodium 136  135 - 145 (mEq/L)    Potassium 4.0  3.5 - 5.1 (mEq/L)    Chloride 101  96 - 112 (mEq/L)    CO2 25  19 - 32 (mEq/L)    Glucose, Bld 151 (*) 70 - 99 (mg/dL)    BUN 110 (*) 6 - 23 (mg/dL)    Creatinine, Ser 3.21 (*) 0.50 - 1.35 (mg/dL)    Calcium 8.6  8.4 - 10.5 (mg/dL)    Total Protein 5.7 (*) 6.0 - 8.3 (g/dL)    Albumin 3.1 (*)  3.5 - 5.2 (g/dL)    AST 14  0 - 37 (U/L)    ALT 10  0 - 53 (U/L)    Alkaline Phosphatase 39  39 - 117 (U/L)    Total Bilirubin 0.1 (*) 0.3 - 1.2 (mg/dL)    GFR calc non Af Amer 16 (*) >90 (mL/min)    GFR calc Af Amer 18 (*) >90 (mL/min)   TYPE AND SCREEN     Status: Normal (Preliminary result)   Collection Time   06/30/11 10:04 PM      Component Value Range Comment   ABO/RH(D) A POS      Antibody Screen NEG      Sample Expiration 07/03/2011      Unit Number [id number]      Blood Component Type RED CELLS,LR      Unit division 00      Status of Unit ISSUED      Transfusion Status OK TO TRANSFUSE      Crossmatch Result Compatible      Unit Number [id number]      Blood Component Type RED CELLS,LR      Unit division 00      Status of Unit ALLOCATED      Transfusion Status OK TO TRANSFUSE      Crossmatch Result Compatible     PREPARE RBC (CROSSMATCH)     Status: Normal   Collection Time   06/30/11 10:59 PM      Component Value Range Comment   Order Confirmation ORDER PROCESSED BY BLOOD BANK     PROTIME-INR     Status: Abnormal   Collection Time   06/30/11 11:04 PM      Component Value Range Comment   Prothrombin Time 39.2 (*) 11.6 - 15.2 (seconds)    INR 3.95 (*) 0.00 - 1.49    APTT     Status: Abnormal   Collection Time   06/30/11 11:04 PM      Component Value Range Comment   aPTT 48 (*) 24 - 37 (seconds)   OCCULT BLOOD, POC DEVICE     Status: Normal   Collection Time   06/30/11 11:21 PM      Component Value Range Comment    Fecal Occult Bld POSITIVE      @[id number] @  Blood pressure 102/39, pulse 63, temperature 98.6 F (37 C), temperature source Oral, resp. rate 12, SpO2 100.00%.    Assessment/Plan 1. Acute blood loss anemia.  Pt is significantly symptomatic with hypotension and dizziness.    2. Acute GI Bleed.  Pt has had melenotic stools since the 19th.  They have become more frequent, but according to the patient remain black and are not red or maroon.  We will check q 6 hour H/H's, place the patient on a protonix drip, give the patient Vitamin K PO now.  In the morning [facility] will be consulted.  Pt has had a colonoscopy in the past, but does not know the name of the gastroenterologist.   His PCP is Dr. Noah Delaine with [facility] cardiology.  3..  Coagulopathy.  Pt is on coumadin for atrial fibrillation.  He states that ordinarily his INR is well controlled between 2.5 and 3.0, but that lately there have been two or three high readings requiring adjustment of his dose.  He has also had increasing frequency of epistaxis which is an old problem for him.  5. Hypotension due to #1.  Will give IV fluids, transfuse and admit to stepdown  status.  SBP of 87.  Pt is asymptomatic.  6. Atrial fibrillation - rate controlled.  Must reverse anticoagulation due to #1, #2, and #3.  7. CAD - continue home meds.  8. ASCVD with significant PVD and carotid stenosis.    Reniya Mcclees 07/01/2011, 1:37 AM
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
## 2011-07-02 ENCOUNTER — Inpatient Hospital Stay (HOSPITAL_COMMUNITY): Payer: Medicare Other

## 2011-07-02 LAB — BASIC METABOLIC PANEL
BUN: 78 mg/dL — ABNORMAL HIGH (ref 6–23)
GFR calc non Af Amer: 22 mL/min — ABNORMAL LOW (ref >90)
Glucose, Bld: 95 mg/dL (ref 70–99)
Potassium: 3.6 mEq/L (ref 3.5–5.1)

## 2011-07-02 LAB — GLUCOSE, CAPILLARY: Glucose-Capillary: 99 mg/dL (ref 70–99)

## 2011-07-02 LAB — PREPARE FRESH FROZEN PLASMA: Unit division: 0

## 2011-07-02 LAB — CBC
Hemoglobin: 8.9 g/dL — ABNORMAL LOW (ref 13.0–17.0)
MCH: 29.8 pg (ref 26.0–34.0)
MCHC: 34.9 g/dL (ref 30.0–36.0)

## 2011-07-02 LAB — PROTIME-INR: Prothrombin Time: 17.3 seconds — ABNORMAL HIGH (ref 11.6–15.2)

## 2011-07-02 LAB — HEMOGLOBIN AND HEMATOCRIT, BLOOD
HCT: 26 % — ABNORMAL LOW (ref 39.0–52.0)
Hemoglobin: 8.9 g/dL — ABNORMAL LOW (ref 13.0–17.0)

## 2011-07-02 MED ORDER — CHLORHEXIDINE GLUCONATE CLOTH 2 % EX PADS
6.0000 | MEDICATED_PAD | Freq: Every day | CUTANEOUS | Status: DC
  Administered 2011-07-03 – 2011-07-07 (×3): 6 via TOPICAL

## 2011-07-02 MED ORDER — BACITRACIN-NEOMYCIN-POLYMYXIN OINTMENT TUBE
TOPICAL_OINTMENT | Freq: Two times a day (BID) | CUTANEOUS | Status: DC
  Administered 2011-07-02: 1 via TOPICAL
  Administered 2011-07-03 (×2): via TOPICAL
  Administered 2011-07-04: 1 via TOPICAL
  Administered 2011-07-05: 11:00:00 via TOPICAL
  Administered 2011-07-06: 1 via TOPICAL
  Administered 2011-07-07: 10:00:00 via TOPICAL
  Filled 2011-07-02: qty 15

## 2011-07-02 MED ORDER — SODIUM CHLORIDE 0.9 % IV BOLUS (SEPSIS)
250.0000 mL | Freq: Once | INTRAVENOUS | Status: AC
  Administered 2011-07-02: 250 mL via INTRAVENOUS

## 2011-07-02 MED ORDER — DIAZEPAM 2 MG PO TABS
2.0000 mg | ORAL_TABLET | Freq: Every evening | ORAL | Status: DC | PRN
  Administered 2011-07-02: 2 mg via ORAL
  Filled 2011-07-02: qty 1

## 2011-07-02 MED ORDER — ACETAMINOPHEN 325 MG PO TABS
650.0000 mg | ORAL_TABLET | Freq: Four times a day (QID) | ORAL | Status: DC | PRN
  Administered 2011-07-02 – 2011-07-04 (×2): 650 mg via ORAL
  Filled 2011-07-02 (×2): qty 2
  Filled 2011-07-02: qty 1

## 2011-07-02 NOTE — Progress Notes (Addendum)
   Called by the nursing staff for temperature spike of 101.54F.  Patient is not on antibiotic or antipyretic.  Panculture ordered, "blood cultures x2, urine culture, portable chest x-ray".  Tylenol started for fever, antibiotic use deferred till the cultures comes back positive or patient has another spike of temperature.  [facility], night coverage  Arionna Hoggard A  07/02/2011, 9:48 PM

## 2011-07-02 NOTE — Progress Notes (Signed)
Subjective: Patient doing much better. No further bloody bowel movements. If still feels a little bit weak and is noticing some palpitations. No chest pain or shortness of breath though. He is hungry.  Objective: Weight change: 0 kg (0 lb)  Intake/Output Summary (Last 24 hours) at 07/02/11 1143 Last data filed at 07/02/11 1100  Gross per 24 hour  Intake   1729 ml  Output   6225 ml  Net  -4496 ml   BP 91/46  Pulse 106  Temp(Src) 97.9 F (36.6 C) (Oral)  Resp 23  Ht 5\' 5"  (1.651 m)  Wt 75.7 kg (166 lb 14.2 oz)  BMI 27.77 kg/m2  SpO2 95% General appearance: alert, cooperative, appears stated age and no distress Lungs: clear to auscultation bilaterally Heart: Irregular rhythm, mild tachycardia Abdomen: soft, non-tender; bowel sounds normal; no masses,  no organomegaly Extremities: extremities normal, atraumatic, no cyanosis or edema  Lab Results: Basic Metabolic Panel:  Basename 07/02/11 0600 07/01/11 0730  NA 143 139  K 3.6 4.2  CL 107 110  CO2 28 21  GLUCOSE 95 96  BUN 78* 101*  CREATININE 2.40* 2.74*  CALCIUM 8.5 7.5*  MG -- --  PHOS -- --   Liver Function Tests:  [facility] 07/01/11 0730 06/30/11 2158  AST 11 14  ALT 7 10  ALKPHOS 28* 39  BILITOT 0.7 0.1*  PROT 4.3* 5.7*  ALBUMIN 2.3* 3.1*   CBC:  Basename 07/02/11 0600 07/01/11 1550 07/01/11 0730 06/30/11 2158 06/30/11 1534  WBC 7.9 -- 9.3 -- --  NEUTROABS -- -- -- 6.6 7.3  HGB 8.9* 6.6* -- -- --  HCT 25.5* 18.7* -- -- --  MCV 85.3 -- 87.2 -- --  PLT 131* -- 110* -- --   BNP:  Basename 07/01/11 0953  POCBNP 631.8*  CBG:  Basename 07/02/11 0745 07/02/11 0408 07/01/11 2003  GLUCAP 106* 99 98   Coagulation:  Basename 07/02/11 0600 07/01/11 0730  LABPROT 17.3* 41.9*  INR 1.39 4.30*    Medications: Scheduled Meds:   . sodium chloride   Intravenous STAT  . Chlorhexidine Gluconate Cloth  6 each Topical Q0600  . furosemide      . furosemide  20 mg Intravenous Q4H  . furosemide  20 mg  Intravenous Once  . furosemide  40 mg Intravenous Once  . levothyroxine  75 mcg Oral QAC breakfast  . mupirocin ointment  1 application Nasal BID  . neomycin-bacitracin-polymyxin   Topical BID  . sodium chloride  250 mL Intravenous Once  . DISCONTD: furosemide  20 mg Intravenous Once  . DISCONTD: furosemide  20 mg Intravenous Once  . DISCONTD: furosemide  20 mg Intramuscular Once  . DISCONTD: furosemide  20 mg Intravenous Once  . DISCONTD: furosemide  40 mg Intravenous Once   Continuous Infusions:   . sodium chloride 100 mL/hr at 07/02/11 1101  . sodium chloride 10 mL/hr at 07/01/11 0940  . sodium chloride Stopped (07/01/11 1700)  . sodium chloride 5 mL/hr at 07/01/11 1500  . pantoprozole (PROTONIX) infusion 8 mg/hr (07/02/11 0700)   PRN Meds:.diazepam, DISCONTD: EPINEPHrine 1:10,000, 10 mL syringe/NS, 10 mL vial for sclerotherapy inj mixture, DISCONTD: fentaNYL, DISCONTD: midazolam  Assessment/Plan: Patient Active Hospital Problem List: GIB (gastrointestinal bleeding) (07/01/2011)  from gastric ulcer. Biopsies pending. Continue PPI drip. Advance to clear liquids. Downgoing to step down status.  Other and unspecified coagulation defects (07/01/2011)  INR reversed. We'll discuss with GI and cardiology as to whether to start Coumadin. Given patient's previous  issues of control and Coumadin level, we'll see if alternatives considered.  ARF (acute renal failure) (07/01/2011)  slowly improving. This was felt to be secondary GI bleed.  Atrial fibrillation (11/10/2010)  off of Coumadin. Mildly tachycardic. We'll give fluid bolus to improve blood pressure which should improve the heart rate some. At that point can start beta blockade.  Hypotension (arterial) (07/01/2011)  as above.  ASCVD (arteriosclerotic cardiovascular disease) (07/01/2011)  stable.    LOS: 2 days   Arneta Mahmood K 07/02/2011, 11:43 AM

## 2011-07-02 NOTE — Progress Notes (Signed)
     [name] 07/02/2011, 9:31 AM  SUBJECTIVE: No BMs, feels well.  No weakness, no SOB  OBJECTIVE: Looks well  Vital signs in last 24 hours: Temp:  [97.4 F (36.3 C)-99 F (37.2 C)] 99 F (37.2 C) (11/22 0751) Pulse Rate:  [58-115] 106  (11/22 0800) Resp:  [12-96] 23  (11/22 0800) BP: (87-127)/(27-61) 91/46 mmHg (11/22 0800) SpO2:  [95 %-100 %] 95 % (11/22 0800) Weight:  [75.7 kg (166 lb 14.2 oz)] 166 lb 14.2 oz (75.7 kg) (11/22 0500) Last BM Date: 06/30/11  Heart: Subtly irregular, slightly rapid rate. Chest: Fine basilar rales, no cough, no SOB Abdomen: Soft, NT,ND, active BS  Extremities: No edema Neuro/Psych:  Pleasant, fully alert and oriented.  No gross deficits.  Intake/Output from previous day: 11/21 0701 - 11/22 0700 In: 2098 [I.V.:745; Blood:1353] Out: 6815 [Urine:6815]  Intake/Output this shift: Total I/O In: 30 [I.V.:30] Out: -   Lab Results:  Basename 07/02/11 0600 07/01/11 1550 07/01/11 0730 06/30/11 2158  WBC 7.9 -- 9.3 10.0  HGB 8.9* 6.6* 6.9* --  HCT 25.5* 18.7* 19.7* --  PLT 131* -- 110* 177    Basename 07/02/11 0600 07/01/11 0730 06/30/11 2158  NA 143 139 136  K 3.6 4.2 4.0  CL 107 110 101  CO2 28 21 25   GLUCOSE 95 96 151*  BUN 78* 101* 110*  CREATININE 2.40* 2.74* 3.21*  CALCIUM 8.5 7.5* 8.6   PT/INR  Basename 07/02/11 0600 07/01/11 0730  LABPROT 17.3* 41.9*  INR 1.39 4.30*   ASSESMENT: 1.  Bleeding Prepyloric ulcer with adherent blood clot,  treated with endo clip 11/21.  No evidence recurrent or ongoing bleeding. Continues on Protonix drip (started 11/21 at 0200, so into 2nd day of infusion therapy) 2. ABL anemia,  Improved post transfusions 3.  AFib.  Chronic Coumadin on hold, INR reversed. When will be ok to restart blood thinners?  4. Acute renal failure.  Improved.   PLAN 1. Protonix infusion for 72 hours. 2.  Clear liquid diet, ordered by Dr. [place] Pink. 3.  CBC this afternoon and in AM, orders in  place.   LOS: 2 days   [name]  07/02/2011, 9:31 AM

## 2011-07-02 NOTE — Progress Notes (Signed)
I have personally reviewed the above note and agree with the plan of care. Delfin Edis, MD

## 2011-07-03 ENCOUNTER — Encounter: Payer: Self-pay | Admitting: Internal Medicine

## 2011-07-03 DIAGNOSIS — M79673 Pain in unspecified foot: Secondary | ICD-10-CM | POA: Diagnosis not present

## 2011-07-03 DIAGNOSIS — R059 Cough, unspecified: Secondary | ICD-10-CM | POA: Diagnosis not present

## 2011-07-03 DIAGNOSIS — R05 Cough: Secondary | ICD-10-CM | POA: Diagnosis not present

## 2011-07-03 DIAGNOSIS — J189 Pneumonia, unspecified organism: Secondary | ICD-10-CM | POA: Diagnosis present

## 2011-07-03 LAB — URINE CULTURE
Colony Count: 9000
Culture  Setup Time: 201211230125

## 2011-07-03 LAB — CBC
Platelets: 132 10*3/uL — ABNORMAL LOW (ref 150–400)
RDW: 16.6 % — ABNORMAL HIGH (ref 11.5–15.5)
WBC: 7.4 10*3/uL (ref 4.0–10.5)

## 2011-07-03 LAB — BASIC METABOLIC PANEL
BUN: 44 mg/dL — ABNORMAL HIGH (ref 6–23)
CO2: 27 mEq/L (ref 19–32)
Calcium: 7.7 mg/dL — ABNORMAL LOW (ref 8.4–10.5)
Chloride: 111 mEq/L (ref 96–112)
Creatinine, Ser: 1.98 mg/dL — ABNORMAL HIGH (ref 0.50–1.35)
GFR calc Af Amer: 33 mL/min — ABNORMAL LOW (ref >90)
GFR calc non Af Amer: 28 mL/min — ABNORMAL LOW (ref >90)
Glucose, Bld: 104 mg/dL — ABNORMAL HIGH (ref 70–99)
Potassium: 3.6 mEq/L (ref 3.5–5.1)
Sodium: 143 mEq/L (ref 135–145)

## 2011-07-03 LAB — URIC ACID: Uric Acid, Serum: 8.6 mg/dL — ABNORMAL HIGH (ref 4.0–7.8)

## 2011-07-03 MED ORDER — MORPHINE SULFATE 2 MG/ML IJ SOLN
2.0000 mg | Freq: Once | INTRAMUSCULAR | Status: AC
  Administered 2011-07-03: 2 mg via INTRAVENOUS
  Filled 2011-07-03: qty 1

## 2011-07-03 MED ORDER — VANCOMYCIN HCL 1000 MG IV SOLR
750.0000 mg | INTRAVENOUS | Status: DC
  Administered 2011-07-03 – 2011-07-06 (×5): 750 mg via INTRAVENOUS
  Filled 2011-07-03 (×5): qty 750

## 2011-07-03 MED ORDER — DIAZEPAM 2 MG PO TABS
2.0000 mg | ORAL_TABLET | Freq: Four times a day (QID) | ORAL | Status: DC | PRN
  Administered 2011-07-03: 2 mg via ORAL
  Filled 2011-07-03: qty 1

## 2011-07-03 MED ORDER — LEVOFLOXACIN IN D5W 750 MG/150ML IV SOLN
750.0000 mg | INTRAVENOUS | Status: DC
  Administered 2011-07-03 – 2011-07-07 (×3): 750 mg via INTRAVENOUS
  Filled 2011-07-03 (×3): qty 150

## 2011-07-03 MED ORDER — DEXTROSE 5 % IV SOLN
1.0000 g | INTRAVENOUS | Status: DC
  Administered 2011-07-03 – 2011-07-07 (×5): 1 g via INTRAVENOUS
  Filled 2011-07-03 (×5): qty 1

## 2011-07-03 MED ORDER — METOPROLOL TARTRATE 12.5 MG HALF TABLET
12.5000 mg | ORAL_TABLET | Freq: Two times a day (BID) | ORAL | Status: DC
  Administered 2011-07-03 – 2011-07-04 (×3): 12.5 mg via ORAL
  Filled 2011-07-03 (×6): qty 1

## 2011-07-03 MED ORDER — OXYCODONE HCL 5 MG PO TABS
5.0000 mg | ORAL_TABLET | ORAL | Status: DC | PRN
  Administered 2011-07-04: 5 mg via ORAL
  Filled 2011-07-03 (×2): qty 1

## 2011-07-03 MED ORDER — FUROSEMIDE 20 MG PO TABS
20.0000 mg | ORAL_TABLET | Freq: Every day | ORAL | Status: DC
  Administered 2011-07-03 – 2011-07-04 (×2): 20 mg via ORAL
  Filled 2011-07-03 (×2): qty 1

## 2011-07-03 NOTE — Progress Notes (Signed)
Subjective: Patient started having cough, as of last night. Denies any fevers or chills. His biggest complaint is bilateral foot pain. He states at the bottom of his feet have been hitting the bottom of the bed causing him some discomfort. He also has some restless leg syndrome and complains of tingling and burning on his feet. He denies any shortness of breath or chest pain. No palpitations.  Patient did spike a temperature last night, prompting blood cultures to be drawn. Urinalysis was unremarkable. Chest x-ray showed signs consistent with some edema, plus or minus infiltrate  Objective: Weight change:   Intake/Output Summary (Last 24 hours) at 07/03/11 1136 Last data filed at 07/03/11 0800  Gross per 24 hour  Intake   2835 ml  Output   1876 ml  Net    959 ml   BP 120/61  Pulse 95  Temp(Src) 98.9 F (37.2 C) (Oral)  Resp 23  Ht 5\' 5"  (1.651 m)  Wt 75.7 kg (166 lb 14.2 oz)  BMI 27.77 kg/m2  SpO2 97% General appearance: alert, cooperative, appears stated age and no distress Lungs: Decreased breath sounds at the bases Heart: Irregular rhythm, mild tachycardia Abdomen: soft, non-tender; bowel sounds normal; no masses,  no organomegaly Extremities: extremities normal, atraumatic, no cyanosis or edema  Lab Results: Basic Metabolic Panel:  Basename 07/03/11 0600 07/02/11 0600  NA 143 143  K 3.6 3.6  CL 111 107  CO2 27 28  GLUCOSE 104* 95  BUN 44* 78*  CREATININE 1.98* 2.40*  CALCIUM 7.7* 8.5  MG -- --  PHOS -- --   Liver Function Tests:  [facility] 07/01/11 0730 06/30/11 2158  AST 11 14  ALT 7 10  ALKPHOS 28* 39  BILITOT 0.7 0.1*  PROT 4.3* 5.7*  ALBUMIN 2.3* 3.1*   CBC:  Basename 07/03/11 0600 07/02/11 1728 07/02/11 0600 06/30/11 2158 06/30/11 1534  WBC 7.4 -- 7.9 -- --  NEUTROABS -- -- -- 6.6 7.3  HGB 8.0* 8.9* -- -- --  HCT 24.0* 26.0* -- -- --  MCV 88.9 -- 85.3 -- --  PLT 132* -- 131* -- --   BNP:  Basename 07/01/11 0953  POCBNP 631.8*   CBG:  Basename 07/02/11 0745 07/02/11 0408 07/01/11 2003  GLUCAP 106* 99 98   Coagulation:  Basename 07/02/11 0600 07/01/11 0730  LABPROT 17.3* 41.9*  INR 1.39 4.30*    Medications: Scheduled Meds:    . Chlorhexidine Gluconate Cloth  6 each Topical Daily  . furosemide  20 mg Oral Daily  . levothyroxine  75 mcg Oral QAC breakfast  . metoprolol tartrate  12.5 mg Oral BID  .  morphine injection  2 mg Intravenous Once  . mupirocin ointment  1 application Nasal BID  . neomycin-bacitracin-polymyxin   Topical BID  . sodium chloride  250 mL Intravenous Once  . DISCONTD: Chlorhexidine Gluconate Cloth  6 each Topical Q0600   Continuous Infusions:    . sodium chloride 10 mL/hr at 07/03/11 0947  . pantoprozole (PROTONIX) infusion 8 mg/hr (07/03/11 0251)  . DISCONTD: sodium chloride 10 mL/hr at 07/01/11 0940  . DISCONTD: sodium chloride Stopped (07/01/11 1700)  . DISCONTD: sodium chloride 5 mL/hr at 07/01/11 1500   PRN Meds:.acetaminophen, diazepam, oxyCODONE, DISCONTD: diazepam  Assessment/Plan: Patient Active Hospital Problem List: GIB (gastrointestinal bleeding) (07/01/2011)  from gastric ulcer. Biopsies pending. Continue PPI drip to one more day. Hemoglobin dropped likely more from hemoconcentration, although we'll continue to monitor. Diet advanced. Heart healthy, and patient being moved  to telemetry.  Acute blood loss anemia: See above.  Other and unspecified coagulation defects (07/01/2011)  INR reversed. We'll discuss with GI and cardiology as to whether to start Coumadin. Given patient's previous issues of control and Coumadin level, we'll see if alternatives considered.  ARF (acute renal failure) (07/01/2011)  slowly improving. This was felt to be secondary GI bleed.  Atrial fibrillation (11/10/2010)  off of Coumadin. Mildly tachycardic. I'm concerned about volume overload. We'll start very low dose beta blocker.   fever: Suspect pneumonia, we'll treat as healthcare  acquired. Triple antibiotic therapy.  Cough: May be in part to pneumonia, but also need to consider the possibility of volume overload. Has stopped extraneous IV fluids and started low dose Lasix. We'll keep and I on his blood pressure.  ASCVD (arteriosclerotic cardiovascular disease) (07/01/2011)  stable.  Leg pain/foot pain: Concerned about the possibility of peripheral neuropathy/restless leg syndrome, we'll treat symptoms. However, given the fact that he has had volume changes. Both addition, and depletion, will rule out gout by checking a serum uric acid level.   LOS: 3 days   [name] K 07/03/2011, 11:36 AM
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
## 2011-07-03 NOTE — Progress Notes (Signed)
I have personally reviewed the above note and agree with the plan of care. Delfin Edis, MD Will await cardiology decision as to whether or when to restart anticoagulation. From  GI standpoint we would like to wait 2 weeks from the bleeding episode.

## 2011-07-03 NOTE — Progress Notes (Signed)
     [facility] Daily Rounding Note 07/03/2011, 8:41 AM  SUBJECTIVE: Note pt fever to 101.1, clx and cxray ordered, no added abx yet. Got fluids, tylenol and no recurrent fevers.  He feels well. One BM yest, was not described, apparently not melenic.  Hungry, getting only clears.  OBJECTIVE: Looks well. Vital signs in last 24 hours: Temp:  [98.6 F (37 C)-101.1 F (38.4 C)] 98.9 F (37.2 C) (11/23 0800) Pulse Rate:  [63-142] 114  (11/23 0600) Resp:  [15-24] 23  (11/23 0600) BP: (87-119)/(34-72) 106/43 mmHg (11/23 0600) SpO2:  [95 %-97 %] 97 % (11/23 0600) Last BM Date: 07/02/11  Heart: Irreg/Irreg.  Not tachy.  Chest: fine rales L greater than R.  No dyspnea or cough Abdomen: Soft, NT,ND. Active BS  Extremities: No edema Neuro/Psych:  Pleasant, not at all confused.  Intake/Output from previous day: 11/22 0701 - 11/23 0700 In: [id number] [P.O.:900; I.V.:1920; IV Piggyback:500] Out: [id number]; Stool:1]  Intake/Output this shift:    Lab Results:  Basename 07/03/11 0600 07/02/11 1728 07/02/11 0600 07/01/11 0730  WBC 7.4 -- 7.9 9.3  HGB 8.0* 8.9* 8.9* --  HCT 24.0* 26.0* 25.5* --  PLT 132* -- 131* 110*    Basename 07/03/11 0600 07/02/11 0600 07/01/11 0730  NA 143 143 139  K 3.6 3.6 4.2  CL 111 107 110  CO2 27 28 21   GLUCOSE 104* 95 96  BUN 44* 78* 101*  CREATININE 1.98* 2.40* 2.74*  CALCIUM 7.7* 8.5 7.5*    Basename 07/01/11 0730  PROT 4.3*  ALBUMIN 2.3*  AST 11  ALT 7  ALKPHOS 28*  BILITOT 0.7  BILIDIR --  IBILI --    Basename 07/02/11 0600 07/01/11 0730  LABPROT 17.3* 41.9*  INR 1.39 4.30*    Studies/Results: Dg Chest Port 1 View  07/02/2011  *RADIOLOGY REPORT*  Clinical Data: Shortness of breath, fever.  PORTABLE CHEST - 1 VIEW  Comparison: 03/27/2010  Findings: Hypoaeration results in interstitial crowding.  There is mild bilateral and right lower peripheral opacity.  Tortuous thoracic aorta.  Heart size enlarged.  Status post median sternotomy  and CABG.  No pneumothorax.  No acute osseous abnormality.  IMPRESSION: Bibasilar and peripheral right lower lung opacities may represent chronic changes versus atelectasis with superimposed pneumonia.  Original Report Authenticated By: Suanne Marker, M.D.    ASSESMENT: 1. Bleeding Prepyloric ulcer with adherent blood clot, treated with endo clip 11/21. No evidence recurrent or ongoing bleeding. Continues on Protonix drip (started 11/21 at 0200, so into 2nd day of infusion therapy)  2. ABL anemia, Improved post transfusions.  Hgb down a bit.  3. AFib. Chronic Coumadin on hold, INR reversed. When will be ok to restart blood thinners?  4. Acute renal failure. Improved 5. Fever.  No leukocytosis.  Opacities on CXR, ? Pneumonia.    PLAN: 1.  Advance to Full Liquids 2.  Follow H & H 3.  Finish Protonix drip tomorrow, then BID PO Protonix.   LOS: 3 days   Azucena Freed  07/03/2011, 8:41 AM

## 2011-07-04 ENCOUNTER — Other Ambulatory Visit: Payer: Self-pay

## 2011-07-04 DIAGNOSIS — D62 Acute posthemorrhagic anemia: Secondary | ICD-10-CM

## 2011-07-04 DIAGNOSIS — K922 Gastrointestinal hemorrhage, unspecified: Secondary | ICD-10-CM

## 2011-07-04 DIAGNOSIS — K250 Acute gastric ulcer with hemorrhage: Secondary | ICD-10-CM

## 2011-07-04 LAB — CBC
Platelets: 143 10*3/uL — ABNORMAL LOW (ref 150–400)
RBC: 2.61 MIL/uL — ABNORMAL LOW (ref 4.22–5.81)
RDW: 16 % — ABNORMAL HIGH (ref 11.5–15.5)
WBC: 9.4 10*3/uL (ref 4.0–10.5)

## 2011-07-04 LAB — TYPE AND SCREEN
ABO/RH(D): A POS
Unit division: 0
Unit division: 0
Unit division: 0

## 2011-07-04 LAB — BASIC METABOLIC PANEL
CO2: 23 mEq/L (ref 19–32)
Chloride: 108 mEq/L (ref 96–112)
GFR calc Af Amer: 35 mL/min — ABNORMAL LOW (ref >90)
Potassium: 3.6 mEq/L (ref 3.5–5.1)

## 2011-07-04 LAB — PRO B NATRIURETIC PEPTIDE: Pro B Natriuretic peptide (BNP): 5256 pg/mL — ABNORMAL HIGH (ref 0–450)

## 2011-07-04 LAB — CARDIAC PANEL(CRET KIN+CKTOT+MB+TROPI)
CK, MB: 2.3 ng/mL (ref 0.3–4.0)
Relative Index: INVALID (ref 0.0–2.5)
Troponin I: <0.3 ng/mL (ref <0.30)

## 2011-07-04 MED ORDER — FUROSEMIDE 20 MG PO TABS
20.0000 mg | ORAL_TABLET | Freq: Two times a day (BID) | ORAL | Status: DC
  Administered 2011-07-04 – 2011-07-05 (×2): 20 mg via ORAL
  Filled 2011-07-04 (×5): qty 1

## 2011-07-04 MED ORDER — SENNOSIDES-DOCUSATE SODIUM 8.6-50 MG PO TABS
1.0000 | ORAL_TABLET | Freq: Every day | ORAL | Status: DC
Start: 2011-07-04 — End: 2011-07-07
  Administered 2011-07-04 – 2011-07-07 (×4): 1 via ORAL
  Filled 2011-07-04 (×5): qty 1

## 2011-07-04 MED ORDER — PANTOPRAZOLE SODIUM 40 MG PO TBEC
40.0000 mg | DELAYED_RELEASE_TABLET | Freq: Two times a day (BID) | ORAL | Status: DC
  Administered 2011-07-04 – 2011-07-07 (×6): 40 mg via ORAL
  Filled 2011-07-04 (×6): qty 1

## 2011-07-04 MED ORDER — DIAZEPAM 5 MG PO TABS
5.0000 mg | ORAL_TABLET | Freq: Every day | ORAL | Status: DC
  Administered 2011-07-04 – 2011-07-06 (×3): 5 mg via ORAL
  Filled 2011-07-04 (×3): qty 1

## 2011-07-04 MED ORDER — METOPROLOL TARTRATE 1 MG/ML IV SOLN
5.0000 mg | Freq: Once | INTRAVENOUS | Status: AC
  Administered 2011-07-04: 5 mg via INTRAVENOUS
  Filled 2011-07-04: qty 5

## 2011-07-04 MED ORDER — PANTOPRAZOLE SODIUM 40 MG PO TBEC: 40.0000 mg | DELAYED_RELEASE_TABLET | Freq: Every day | ORAL | Status: DC

## 2011-07-04 MED ORDER — GABAPENTIN 100 MG PO CAPS
100.0000 mg | ORAL_CAPSULE | Freq: Two times a day (BID) | ORAL | Status: DC
  Administered 2011-07-04 – 2011-07-07 (×7): 100 mg via ORAL
  Filled 2011-07-04 (×8): qty 1

## 2011-07-04 NOTE — Progress Notes (Signed)
Subjective: Patient was to floor. His basic complaint is leg pain, which she feels is from his sciatica. Irritated by being on his back a lot while in bed. He is normally used to be quite active.  He states his breathing is low, but better. Less cough. Objective: Weight change:   Intake/Output Summary (Last 24 hours) at 07/04/11 1541 Last data filed at 07/04/11 1439  Gross per 24 hour  Intake 1454.16 ml  Output    880 ml  Net 574.16 ml   BP 126/65  Pulse 93  Temp(Src) 99.5 F (37.5 C) (Oral)  Resp 20  Ht 5\' 5"  (1.651 m)  Wt 75.7 kg (166 lb 14.2 oz)  BMI 27.77 kg/m2  SpO2 95% General appearance: alert, cooperative, appears stated age and no distress Lungs: Decreased breath sounds at the bases Heart: Irregular rhythm, mild tachycardia Abdomen: soft, non-tender; bowel sounds normal; no masses,  no organomegaly Extremities: extremities normal, atraumatic, no cyanosis or edema  Lab Results: Basic Metabolic Panel:  Basename 07/04/11 0600 07/03/11 0600  NA 140 143  K 3.6 3.6  CL 108 111  CO2 23 27  GLUCOSE 111* 104*  BUN 33* 44*  CREATININE 1.87* 1.98*  CALCIUM 7.9* 7.7*  MG -- --  PHOS -- --   CBC:  Basename 07/04/11 0600 07/03/11 0600  WBC 9.4 7.4  NEUTROABS -- --  HGB 7.9* 8.0*  HCT 23.5* 24.0*  MCV 90.0 88.9  PLT 143* 132*   BNP:  Basename 07/04/11 0600  POCBNP 5256.0*  CBG:  Basename 07/02/11 0745 07/02/11 0408 07/01/11 2003  GLUCAP 106* 99 98   Coagulation:  Basename 07/02/11 0600  LABPROT 17.3*  INR 1.39    Medications: Scheduled Meds:    . ceFEPime (MAXIPIME) IV  1 g Intravenous Q24H  . Chlorhexidine Gluconate Cloth  6 each Topical Daily  . diazepam  5 mg Oral QHS  . furosemide  20 mg Oral Daily  . gabapentin  100 mg Oral BID  . levofloxacin (LEVAQUIN) IV  750 mg Intravenous Q48H  . levothyroxine  75 mcg Oral QAC breakfast  . metoprolol tartrate  12.5 mg Oral BID  . mupirocin ointment  1 application Nasal BID  .  neomycin-bacitracin-polymyxin   Topical BID  . pantoprazole  40 mg Oral BID AC  . senna-docusate  1 tablet Oral Daily  . vancomycin  750 mg Intravenous Q24H  . DISCONTD: pantoprazole  40 mg Oral Q1200   Continuous Infusions:    . sodium chloride 20 mL/hr at 07/04/11 0545  . DISCONTD: pantoprozole (PROTONIX) infusion 8 mg/hr (07/04/11 0545)   PRN Meds:.acetaminophen, diazepam, oxyCODONE  Assessment/Plan: Patient Active Hospital Problem List: GIB (gastrointestinal bleeding) (07/01/2011)  from gastric ulcer. Biopsies pending. Hemoglobin stable. Change over to protonic by mouth Acute blood loss anemia: See above.  Other and unspecified coagulation defects (07/01/2011)  INR reversed. We'll restart Coumadin once he has been better. Diuresed.  ARF (acute renal failure) (07/01/2011)  slowly improving. This was felt to be secondary GI bleed.  Atrial fibrillation (11/10/2010)  off of Coumadin. Mildly tachycardic. I'm concerned about volume overload. We'll start very low dose beta blocker.   fever: Suspect pneumonia, we'll treat as healthcare acquired. Triple antibiotic therapy.  Cough: May be in part to pneumonia, but also need to consider the possibility of volume overload. Has stopped extraneous IV fluids and started low dose Lasix. We'll keep and I on his blood pressure.  Acute diastolic heart failure: Still getting IV antibiotics for pneumonia.  Increase Lasix to twice a day.  ASCVD (arteriosclerotic cardiovascular disease) (07/01/2011)  stable.  Leg pain/foot pain: Looks to be more sciatica, although noting his serum uric acid level is mildly elevated. We'll see how physical therapy goes. If he does not improve, well or his feet. Continue to hurt, then we'll try prednisone.   LOS: 4 days   Alyss Granato K 07/04/2011, 3:41 PM

## 2011-07-04 NOTE — Progress Notes (Signed)
I have personally reviewed the above note and agree with the plan of care.Hgb still dropping but no stools,  Probably equilibration. May need a transfusion  before discharge Delfin Edis, MD

## 2011-07-04 NOTE — Progress Notes (Signed)
Dictation for Dr. [place] Pink triad hospitalist Chief complaint tachycardia. History of present illness This elderly male admitted with a GI bleed. Had been on anticoagulation with a history of atrial fib. The patient has had some incidence of tachycardia staff called me tonight with a pulse rate consistently in the 1:30 to 140 range. A 12-lead EKG was obtained and this indicates atrial fib with rapid ventricular response. There is a history of atrial fib in the past. There is a history of coronary artery disease and prior CABG. I noted the patient was restarted on his Metroprolol today getting first dose of 12.5 mg this a.m. Vital signs temperature 99.9, pulse 120 to 1:30 sustaining, respiration 20, blood pressure 126/70. Gen. appearance well-developed elderly male in no distress. Rate and rhythm are irregular and tachycardic. No jugular venous distention or edema to suggest failure. There are no complaints of chest pain or dyspnea. Respiratory. Breath sounds are reduced but clear. No distress or cough. Abdomen lead abdomen. Abdomen. Soft with positive bowel sounds. No pain. Impression/plan. #1 atrial fib with rapid ventricular response. Patient's blood pressure is stable in the [id number] systolic. I will give him a one-time dose of metoprolol 5 mg IV. We will hold the oral Metroprolol this p.m. only. A 12-lead EKG suggestive every 8 changes and prior MI. Patient does confirm high he has had a prior MI. Again he has no complaints of dyspnea or chest pain. Nursing to call if he Metroprolol the is not effective I can reduce his heart rate back down to a reasonable level. Despite the fact that he has no chest pain I will go ahead and cycle cardiac enzymes x3 every 8 hours.

## 2011-07-04 NOTE — Progress Notes (Signed)
     [facility] Daily Rounding Note 07/04/2011, 9:14 AM  SUBJECTIVE: No BM in more than 48 hours.  Eating most meals.  C/O Pain in Left hip and leg after troublesome night of restless legs and pain from the PAS hose.  Some hallucinations after oxycodone.  No SOB, no sweats.  Hoping to have opportunity to walk today. OBJECTIVE: Looks more frail this AM  Vital signs in last 24 hours: Temp:  [98.7 F (37.1 C)-99.7 F (37.6 C)] 99.5 F (37.5 C) (11/24 0528) Pulse Rate:  [71-101] 71  (11/24 0528) Resp:  [20-22] 20  (11/24 0528) BP: (114-147)/(60-70) 114/64 mmHg (11/24 0528) SpO2:  [94 %-95 %] 95 % (11/24 0528) Last BM Date: 07/02/11  Heart: RRR Chest: Clear B Abdomen: Soft, NT, ND, active BS  Extremities: No edema Neuro/Psych:  Pleasant, more tremulous today  Intake/Output from previous day: 11/23 0701 - 11/24 0700 In: 1259.2 [I.V.:909.2; IV Piggyback:350] Out: [id number] [Urine:955]  Intake/Output this shift:    Lab Results:  Basename 07/04/11 0600 07/03/11 0600 07/02/11 1728 07/02/11 0600  WBC 9.4 7.4 -- 7.9  HGB 7.9* 8.0* 8.9* --  HCT 23.5* 24.0* 26.0* --  PLT 143* 132* -- 131*   BMET  Basename 07/04/11 0600 07/03/11 0600 07/02/11 0600  NA 140 143 143  K 3.6 3.6 3.6  CL 108 111 107  CO2 23 27 28   GLUCOSE 111* 104* 95  BUN 33* 44* 78*  CREATININE 1.87* 1.98* 2.40*  CALCIUM 7.9* 7.7* 8.5   LFT No results found for this basename: PROT,ALBUMIN,AST,ALT,ALKPHOS,BILITOT,BILIDIR,IBILI in the last 72 hours PT/INR  Basename 07/02/11 0600  LABPROT 17.3*  INR 1.39    ASSESMENT: 1. Bleeding Prepyloric ulcer with adherent blood clot, treated with endo clip 11/21. No evidence recurrent or ongoing bleeding. Pathology negative for H. Pylori.  Continues on Protonix drip (started 11/21 at 0200, so into 2nd day of infusion therapy)  2. ABL anemia, Improved post transfusions. Hgb down a bit.  3. AFib. Chronic Coumadin on hold, INR reversed. When will be ok to restart blood  thinners?  4. Acute renal failure. Improved  5. Fever 11/22, no recurrence. No leukocytosis. Opacities on CXR, ? Pneumonia. On vanco IV, Levaquin IV, Maxipime       IV  PLAN: 1.  Coomplete protonix drip.  Start PO Protonix. 2.  Would fine tune the abx.  3.  CBC in AM 4.  [place]   LOS: 4 days   Azucena Freed  07/04/2011, 9:14 AM Pager: [phone]
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
## 2011-07-05 DIAGNOSIS — D62 Acute posthemorrhagic anemia: Secondary | ICD-10-CM

## 2011-07-05 DIAGNOSIS — K922 Gastrointestinal hemorrhage, unspecified: Secondary | ICD-10-CM

## 2011-07-05 DIAGNOSIS — K250 Acute gastric ulcer with hemorrhage: Secondary | ICD-10-CM

## 2011-07-05 LAB — BASIC METABOLIC PANEL
CO2: 24 mEq/L (ref 19–32)
Chloride: 105 mEq/L (ref 96–112)
GFR calc non Af Amer: 26 mL/min — ABNORMAL LOW (ref >90)
Glucose, Bld: 125 mg/dL — ABNORMAL HIGH (ref 70–99)
Potassium: 3 mEq/L — ABNORMAL LOW (ref 3.5–5.1)
Sodium: 138 mEq/L (ref 135–145)

## 2011-07-05 LAB — CBC
Hemoglobin: 7.8 g/dL — ABNORMAL LOW (ref 13.0–17.0)
MCHC: 33.3 g/dL (ref 30.0–36.0)
RBC: 2.59 MIL/uL — ABNORMAL LOW (ref 4.22–5.81)
WBC: 8.2 10*3/uL (ref 4.0–10.5)

## 2011-07-05 LAB — DIRECT ANTIGLOBULIN TEST (NOT AT ARMC): DAT, complement: NEGATIVE

## 2011-07-05 LAB — CARDIAC PANEL(CRET KIN+CKTOT+MB+TROPI)
CK, MB: 2.4 ng/mL (ref 0.3–4.0)
CK, MB: 5 ng/mL — ABNORMAL HIGH (ref 0.3–4.0)

## 2011-07-05 LAB — PREPARE RBC (CROSSMATCH)

## 2011-07-05 MED ORDER — METOPROLOL TARTRATE 25 MG PO TABS
25.0000 mg | ORAL_TABLET | Freq: Two times a day (BID) | ORAL | Status: DC
  Administered 2011-07-05 – 2011-07-06 (×3): 25 mg via ORAL
  Filled 2011-07-05 (×4): qty 1

## 2011-07-05 MED ORDER — FUROSEMIDE 20 MG PO TABS
20.0000 mg | ORAL_TABLET | Freq: Every day | ORAL | Status: DC
  Administered 2011-07-06 – 2011-07-07 (×2): 20 mg via ORAL
  Filled 2011-07-05 (×2): qty 1

## 2011-07-05 MED ORDER — ACETAMINOPHEN 325 MG PO TABS
650.0000 mg | ORAL_TABLET | Freq: Once | ORAL | Status: AC
  Administered 2011-07-05: 650 mg via ORAL
  Filled 2011-07-05: qty 2

## 2011-07-05 MED ORDER — POTASSIUM CHLORIDE CRYS ER 20 MEQ PO TBCR
40.0000 meq | EXTENDED_RELEASE_TABLET | Freq: Once | ORAL | Status: AC
  Administered 2011-07-05: 40 meq via ORAL
  Filled 2011-07-05: qty 2

## 2011-07-05 MED ORDER — FUROSEMIDE 10 MG/ML IJ SOLN
20.0000 mg | Freq: Once | INTRAMUSCULAR | Status: AC
  Administered 2011-07-05: 20 mg via INTRAVENOUS
  Filled 2011-07-05: qty 2

## 2011-07-05 MED ORDER — FUROSEMIDE 10 MG/ML IJ SOLN
INTRAMUSCULAR | Status: AC
  Filled 2011-07-05: qty 4

## 2011-07-05 NOTE — Progress Notes (Signed)
Subjective: Patient feeling markedly better today. He is able to move his legs freely and without pain. His only complaint really is of left hand cramping. No shortness of breath. Today.  He states his breathing is low, but better. Less cough. Objective: Weight change:   Intake/Output Summary (Last 24 hours) at 07/05/11 1125 Last data filed at 07/05/11 1042  Gross per 24 hour  Intake   1285 ml  Output   1550 ml  Net   -265 ml   BP 133/71  Pulse 69  Temp(Src) 98.2 F (36.8 C) (Oral)  Resp 18  Ht 5\' 5"  (1.651 m)  Wt 75.7 kg (166 lb 14.2 oz)  BMI 27.77 kg/m2  SpO2 98% General appearance: alert, cooperative, appears stated age and no distress Lungs: Decreased breath sounds at the bases Heart: Irregular rhythm, rate controlled. Today Abdomen: soft, non-tender; bowel sounds normal; no masses,  no organomegaly Extremities: extremities normal, atraumatic, no cyanosis or edema  Lab Results: Basic Metabolic Panel:  Basename 07/05/11 0600 07/04/11 0600  NA 138 140  K 3.0* 3.6  CL 105 108  CO2 24 23  GLUCOSE 125* 111*  BUN 34* 33*  CREATININE 2.11* 1.87*  CALCIUM 8.0* 7.9*  MG -- --  PHOS -- --   CBC:  Basename 07/05/11 0600 07/04/11 0600  WBC 8.2 9.4  NEUTROABS -- --  HGB 7.8* 7.9*  HCT 23.4* 23.5*  MCV 90.3 90.0  PLT 160 143*   BNP:  Basename 07/04/11 0600  POCBNP 5256.0*   Medications: Scheduled Meds:    . acetaminophen  650 mg Oral Once  . ceFEPime (MAXIPIME) IV  1 g Intravenous Q24H  . Chlorhexidine Gluconate Cloth  6 each Topical Daily  . diazepam  5 mg Oral QHS  . furosemide  20 mg Intravenous Once  . furosemide  20 mg Oral Daily  . gabapentin  100 mg Oral BID  . levofloxacin (LEVAQUIN) IV  750 mg Intravenous Q48H  . levothyroxine  75 mcg Oral QAC breakfast  . metoprolol  5 mg Intravenous Once  . metoprolol tartrate  25 mg Oral BID  . mupirocin ointment  1 application Nasal BID  . neomycin-bacitracin-polymyxin   Topical BID  . pantoprazole  40 mg  Oral BID AC  . potassium chloride  40 mEq Oral Once  . senna-docusate  1 tablet Oral Daily  . vancomycin  750 mg Intravenous Q24H  . DISCONTD: furosemide  20 mg Oral Daily  . DISCONTD: furosemide  20 mg Oral BID  . DISCONTD: metoprolol tartrate  12.5 mg Oral BID   Continuous Infusions:    . sodium chloride 20 mL/hr at 07/05/11 0506   PRN Meds:.acetaminophen, diazepam, oxyCODONE  Assessment/Plan: Patient Active Hospital Problem List: GIB (gastrointestinal bleeding) (07/01/2011)  from gastric ulcer. Hemoglobin stable. No further bleeding. Getting one additional unit of packed red blood cells. On oral PPI. Hold on Coumadin for at least one more week.  Other and unspecified coagulation defects (07/01/2011)  INR reversed. Restart Coumadin in about one week.  ARF (acute renal failure) (07/01/2011)  mild increase in creatinine today. Likely from mild overdiuresis. He is getting additional blood of which will need some Lasix for this but overall can restart back on regular Lasix. He is euvolemic.  Atrial fibrillation (11/10/2010)  off of Coumadin. Mildly tachycardic. I'm concerned about volume overload. It should improve when he gets a little bit more blood. Increase his beta blocker slightly for an evening dose.   fever: Healthcare associated pneumonia.  On antibiotic therapy.   Acute diastolic heart failure: Still getting IV antibiotics for pneumonia. Increase Lasix to twice a day.  ASCVD (arteriosclerotic cardiovascular disease) (07/01/2011)  stable.  Leg pain/foot pain: Doing much better. I suspect that some of his name and sciatica. Irritated by prolonged bed rest. He is more ambulatory and feeling much more better.  Hypokalemia: From the Lasix. Which was likely causing his hand cramping. Replacing.   LOS: 5 days   KRISHNAN,SENDIL K 07/05/2011, 11:25 AM

## 2011-07-05 NOTE — Progress Notes (Signed)
Patient ID: [name], male   DOB: 06-Aug-1920, 77 y.o.   MRN: [id number] [facility] Progress Note  Subjective: [name] is complaining of left hip pain and pain in his left hand. He has not been up ambulating, and is concerned about going home if he can't get around. Is no complaint of abdominal pain has been needing well. He has not had a bowel movement over the past couple of days. He was on Coumadin on admission for atrial fibrillation, Dr.Nasher is his cardiologist and we'll need to decide when to resume Coumadin.  Objective:  Vital signs in last 24 hours: Temp:  [98.1 F (36.7 C)-99.9 F (37.7 C)] 98.2 F (36.8 C) (11/25 0440) Pulse Rate:  [69-119] 69  (11/25 0440) Resp:  [17-20] 18  (11/25 0440) BP: (111-133)/(57-71) 133/71 mmHg (11/25 0440) SpO2:  [94 %-98 %] 98 % (11/25 0440) Last BM Date: 07/02/11 General:   Alert,  Well-developed,  White male in NAD Heart:  Regular rate and rhythm; no murmurs Abdomen:  Soft, nontender and nondistended. Normal bowel sounds, without guarding, and without rebound.   Extremities:  Without edema. Left hand slightly swollen and very tender [name] the proximal joint of the thumb Neurologic:  Alert and  oriented x4;  grossly normal neurologically. Psych:  Alert and cooperative. Normal mood and affect.  Intake/Output from previous day: 11/24 0701 - 11/25 0700 In: 1285 [P.O.:600; I.V.:485; IV Piggyback:200] Out: 1550 [Urine:1550] Intake/Output this shift:    Lab Results:  Basename 07/05/11 0600 07/04/11 0600 07/03/11 0600  WBC 8.2 9.4 7.4  HGB 7.8* 7.9* 8.0*  HCT 23.4* 23.5* 24.0*  PLT 160 143* 132*   BMET  Basename 07/05/11 0600 07/04/11 0600 07/03/11 0600  NA 138 140 143  K 3.0* 3.6 3.6  CL 105 108 111  CO2 24 23 27   GLUCOSE 125* 111* 104*  BUN 34* 33* 44*  CREATININE 2.11* 1.87* 1.98*  CALCIUM 8.0* 7.9* 7.7*           Assessment / Plan: #35 77 year old male, stable status post acute GI bleed secondary to  a prepyloric ulcer which required Endo clipping. His hemoglobin is relatively stable, has drifted slightly over the past couple of days and given his advanced age and history of coronary disease would favor transfusion to keep him above 8. #2 Anemia acute secondary to above #3 chronic atrial fibrillation, patient chronically anticoagulated with Coumadin. Would favor leaving him off Coumadin for 2 weeks to allow ulcer healing, may want to discuss with his cardiologist  Will order 1 unit of packed RBCs today Plan to continue Protonix twice daily on discharge for 6 weeks then once daily long-term. Principal Problem:  *GIB (gastrointestinal bleeding) Active Problems:  Atrial fibrillation  Hypotension (arterial)  Other and unspecified coagulation defects  ASCVD (arteriosclerotic cardiovascular disease)  ARF (acute renal failure)  Healthcare-associated pneumonia  Cough  Foot pain     LOS: 5 days   [name]  07/05/2011, 9:17 AM

## 2011-07-06 LAB — CBC
HCT: 29.2 % — ABNORMAL LOW (ref 39.0–52.0)
Platelets: 208 10*3/uL (ref 150–400)
RBC: 3.3 MIL/uL — ABNORMAL LOW (ref 4.22–5.81)
RDW: 15.7 % — ABNORMAL HIGH (ref 11.5–15.5)
WBC: 9.6 10*3/uL (ref 4.0–10.5)

## 2011-07-06 LAB — BASIC METABOLIC PANEL
CO2: 20 mEq/L (ref 19–32)
Chloride: 103 mEq/L (ref 96–112)
GFR calc Af Amer: 29 mL/min — ABNORMAL LOW (ref >90)
Potassium: 3.2 mEq/L — ABNORMAL LOW (ref 3.5–5.1)

## 2011-07-06 LAB — TYPE AND SCREEN
ABO/RH(D): A POS
Antibody Screen: NEGATIVE
Unit division: 0

## 2011-07-06 LAB — VANCOMYCIN, TROUGH: Vancomycin Tr: 10.7 ug/mL (ref 10.0–20.0)

## 2011-07-06 MED ORDER — POTASSIUM CHLORIDE CRYS ER 10 MEQ PO TBCR
30.0000 meq | EXTENDED_RELEASE_TABLET | Freq: Once | ORAL | Status: AC
  Administered 2011-07-06: 30 meq via ORAL
  Filled 2011-07-06: qty 1

## 2011-07-06 MED ORDER — VANCOMYCIN HCL IN DEXTROSE 1-5 GM/200ML-% IV SOLN
1000.0000 mg | INTRAVENOUS | Status: DC
  Administered 2011-07-07: 1000 mg via INTRAVENOUS
  Filled 2011-07-06: qty 200

## 2011-07-06 MED ORDER — POTASSIUM CHLORIDE CRYS ER 20 MEQ PO TBCR
30.0000 meq | EXTENDED_RELEASE_TABLET | Freq: Once | ORAL | Status: DC
  Filled 2011-07-06: qty 1

## 2011-07-06 MED ORDER — METOPROLOL TARTRATE 25 MG PO TABS
25.0000 mg | ORAL_TABLET | Freq: Two times a day (BID) | ORAL | Status: DC
  Administered 2011-07-06 – 2011-07-07 (×2): 25 mg via ORAL
  Filled 2011-07-06 (×4): qty 1

## 2011-07-06 NOTE — Plan of Care (Signed)
Problem: Phase II Progression Outcomes Goal: H&H stablized < 1gm drop in 24 hrs Outcome: Completed/Met Date Met:  07/06/11 HGB 9.9 HCT 29.2  Problem: Phase III Progression Outcomes Goal: Activity at appropriate level-compared to baseline (UP IN CHAIR FOR HEMODIALYSIS)  Outcome: Progressing PT. Ambulating 3x today down the hall.

## 2011-07-06 NOTE — Progress Notes (Signed)
Subjective: Doing much better. Worked well with PT and able to stand up a number of times himself to his walker. Breathing easier. Minimal leg pain.  Objective: Weight change:   Intake/Output Summary (Last 24 hours) at 07/06/11 1652 Last data filed at 07/06/11 1400  Gross per 24 hour  Intake 1478.67 ml  Output    850 ml  Net 628.67 ml   BP 105/70  Pulse 104  Temp(Src) 98.2 F (36.8 C) (Oral)  Resp 20  Ht 5\' 5"  (1.651 m)  Wt 75.7 kg (166 lb 14.2 oz)  BMI 27.77 kg/m2  SpO2 98% General appearance: alert, cooperative, appears stated age and no distress Lungs: Decreased breath sounds at the bases Heart: Irregular rhythm, rate controlled. Today Abdomen: soft, non-tender; bowel sounds normal; no masses,  no organomegaly Extremities: extremities normal, atraumatic, no cyanosis or edema  Lab Results: Basic Metabolic Panel:  Basename 07/06/11 0900 07/05/11 0600  NA 135 138  K 3.2* 3.0*  CL 103 105  CO2 20 24  GLUCOSE 201* 125*  BUN 37* 34*  CREATININE 2.21* 2.11*  CALCIUM 8.2* 8.0*  MG -- --  PHOS -- --   CBC:  Basename 07/06/11 0900 07/05/11 0600  WBC 9.6 8.2  NEUTROABS -- --  HGB 9.9* 7.8*  HCT 29.2* 23.4*  MCV 88.5 90.3  PLT 208 160   BNP:  Basename 07/04/11 0600  POCBNP 5256.0*   Medications: Scheduled Meds:    . ceFEPime (MAXIPIME) IV  1 g Intravenous Q24H  . Chlorhexidine Gluconate Cloth  6 each Topical Daily  . diazepam  5 mg Oral QHS  . furosemide  20 mg Oral Daily  . gabapentin  100 mg Oral BID  . levofloxacin (LEVAQUIN) IV  750 mg Intravenous Q48H  . levothyroxine  75 mcg Oral QAC breakfast  . metoprolol tartrate  25 mg Oral BID  . mupirocin ointment  1 application Nasal BID  . neomycin-bacitracin-polymyxin   Topical BID  . pantoprazole  40 mg Oral BID AC  . senna-docusate  1 tablet Oral Daily  . vancomycin  1,000 mg Intravenous Q24H  . DISCONTD: vancomycin  750 mg Intravenous Q24H   Continuous Infusions:    . sodium chloride 20 mL/hr at  07/06/11 1238   PRN Meds:.acetaminophen, diazepam, oxyCODONE  Assessment/Plan: Patient Active Hospital Problem List: GIB (gastrointestinal bleeding) (07/01/2011)  from gastric ulcer. Hemoglobin stable. No further bleeding. Getting one additional unit of packed red blood cells. On oral PPI. Hold on Coumadin for at least one more week.  Other and unspecified coagulation defects (07/01/2011)  INR reversed. Restart Coumadin in about one week.  ARF (acute renal failure) (07/01/2011)  mild increase in creatinine today. Likely from mild overdiuresis. He is getting additional blood of which will need some Lasix for this but overall can restart back on regular Lasix. He is euvolemic.  Atrial fibrillation (11/10/2010)  off of Coumadin. Mildly tachycardic. I changed his morning medication times a.m. and 8 PM. This should help with this early morning. Tachycardic. Heart rates.  Acute diastolic heart failure: Still getting IV antibiotics for pneumonia. Scheduled back his Lasix to by mouth.  ASCVD (arteriosclerotic cardiovascular disease) (07/01/2011)  stable.  Leg pain/foot pain: Doing much better. I suspect that some of his name and sciatica. Irritated by prolonged bed rest. He is more ambulatory and feeling much more better.  Hypokalemia: From the Lasix. Which was likely causing his hand cramping. Replacing.   Working well with PT. Home tomorrow with home health.  LOS: 6 days   [name] K 07/06/2011, 4:52 PM
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
## 2011-07-06 NOTE — Progress Notes (Signed)
Seen and agree with SPT note. [name], [place]

## 2011-07-06 NOTE — Progress Notes (Signed)
Physical Therapy Treatment Patient Details Name: [name] MRN: [id number] DOB: [birth year] Today's Date: 07/06/2011  PT Assessment/Plan  PT - Assessment/Plan Comments on Treatment Session: Pt. was very enthusiastic to get up and ambulate.  Expressed desire to continue to ambulate throughout the day and night with nursing.  Discussed home adaptations to allow pt. to return to home.  Pt. was very receptive to suggestions and is aware of the importance of increasing his mobility.  PT Plan: Discharge plan needs to be updated Follow Up Recommendations: Home health PT Equipment Recommended: Rolling walker with 5" wheels PT Goals  Acute Rehab PT Goals PT Goal: Supine/Side to Sit - Progress: Other (comment) (Not addressed today. ) PT Goal: Sit to Supine/Side - Progress: Other (comment) (Not addressed today. ) PT Transfer Goal: Sit to Stand/Stand to Sit - Progress: Progressing toward goal Pt will Ambulate: 51 - 150 feet;with supervision PT Goal: Ambulate - Progress: Met  PT Treatment Precautions/Restrictions  Precautions Precautions: Fall Restrictions Weight Bearing Restrictions: No Mobility (including Balance) Transfers Transfers: Yes Sit to Stand: 4: Min assist;5: Supervision;From chair/3-in-1;With armrests Sit to Stand Details (indicate cue type and reason): Min assist on first trial, supervision on subsequent 5 trials.  Verbal cues to scoot to edge and for hand placement.  Stand to Sit: 5: Supervision;To chair/3-in-1;With armrests Stand to Sit Details: Five trials. Cues for hand placement.  Ambulation/Gait Ambulation/Gait: Yes Ambulation/Gait Assistance: 5: Supervision Ambulation/Gait Assistance Details (indicate cue type and reason): Pt. reported pushing less through his arms on RW.   Ambulation Distance (Feet): 225 Feet Assistive device: Rolling walker Gait Pattern: Step-through pattern;Decreased stride length    Exercise    End of Session PT - End of  Session Equipment Utilized During Treatment: Gait belt Activity Tolerance: Patient tolerated treatment well Patient left: in chair;with call bell in reach Nurse Communication: Mobility status for transfers;Mobility status for ambulation General Behavior During Session: [facility] for tasks performed Cognition: [facility] for tasks performed  Barney Drain, SPT  07/06/2011, 3:17 PM

## 2011-07-07 ENCOUNTER — Encounter: Payer: Medicare Other | Admitting: *Deleted

## 2011-07-07 ENCOUNTER — Encounter (HOSPITAL_COMMUNITY): Payer: Self-pay | Admitting: Internal Medicine

## 2011-07-07 LAB — BASIC METABOLIC PANEL
CO2: 22 mEq/L (ref 19–32)
Chloride: 103 mEq/L (ref 96–112)
Glucose, Bld: 147 mg/dL — ABNORMAL HIGH (ref 70–99)
Potassium: 3.5 mEq/L (ref 3.5–5.1)
Sodium: 135 mEq/L (ref 135–145)

## 2011-07-07 LAB — CBC
HCT: 28.2 % — ABNORMAL LOW (ref 39.0–52.0)
Hemoglobin: 9.5 g/dL — ABNORMAL LOW (ref 13.0–17.0)
RBC: 3.15 MIL/uL — ABNORMAL LOW (ref 4.22–5.81)

## 2011-07-07 MED ORDER — OMEPRAZOLE 20 MG PO CPDR: 20.0000 mg | DELAYED_RELEASE_CAPSULE | Freq: Every day | ORAL | Status: DC

## 2011-07-07 MED ORDER — LEVOFLOXACIN 500 MG PO TABS: 500.0000 mg | ORAL_TABLET | Freq: Every day | ORAL | Status: DC

## 2011-07-07 MED ORDER — FLEET ENEMA 7-19 GM/118ML RE ENEM
1.0000 | ENEMA | Freq: Once | RECTAL | Status: AC
  Administered 2011-07-07: 1 via RECTAL
  Filled 2011-07-07: qty 1

## 2011-07-07 NOTE — Progress Notes (Signed)
Physical Therapy Treatment Patient Details Name: [name] MRN: [id number] DOB: 04-08-1920 Today's Date: 07/07/2011  PT Assessment/Plan  PT - Assessment/Plan Comments on Treatment Session: Pt. tolerated treatment very well today.  Practiced sitting and standing to and from toilet, as sit to stand/stand to sit were pt.'s main concerns about returning to home.  Pt. also states that he continues to perform exercises when seated, and is aware of the importance of exercise and walking for continued improvements with mobility.  PT Plan: Discharge plan remains appropriate Follow Up Recommendations: Home health PT PT Goals  Acute Rehab PT Goals PT Goal: Supine/Side to Sit - Progress: Other (comment) (Not addressed. ) PT Goal: Sit to Supine/Side - Progress: Other (comment) (Not addressed.  Per pt., no problems getting in/out of  bed) PT Transfer Goal: Sit to Stand/Stand to Sit - Progress: Met PT Goal: Ambulate - Progress: Met  PT Treatment Precautions/Restrictions  Precautions Precautions: Fall Restrictions Weight Bearing Restrictions: No Mobility (including Balance) Transfers Transfers: Yes Sit to Stand: 6: Modified independent (Device/Increase time);From bed;From toilet Sit to Stand Details (indicate cue type and reason): Pt. able to perform with correct sequencing, requires increased time.  Performed 2 trials.  Stand to Sit: 6: Modified independent (Device/Increase time);To toilet;To chair/3-in-1;With armrests Stand to Sit Details: Two trials.  Used handrail when standing from toilet.  Ambulation/Gait Ambulation/Gait: Yes Ambulation/Gait Assistance: 6: Modified independent (Device/Increase time) Ambulation Distance (Feet): 500 Feet Assistive device: Rolling walker Gait Pattern: Step-through pattern;Decreased stride length    Exercise  General Exercises - Lower Extremity Long Arc Quad: AROM;15 reps;Both;Seated Hip Flexion/Marching: AROM;Both;Seated;Other reps (comment) (15  reps) End of Session PT - End of Session Equipment Utilized During Treatment: Gait belt Activity Tolerance: Patient tolerated treatment well Patient left: in chair;with call bell in reach Nurse Communication: Mobility status for transfers;Mobility status for ambulation General Behavior During Session: [facility] for tasks performed Cognition: [facility] for tasks performed  Barney Drain, SPT  07/07/2011, 9:45 AM

## 2011-07-07 NOTE — Discharge Summary (Signed)
DISCHARGE SUMMARY  [name]  MR#: [id number]  DOB:1920-01-09  Date of Admission: 06/30/2011 Date of Discharge: 07/07/2011  Attending Physician:KRISHNAN,SENDIL K  Patient's AV:[id number], MD, MD  Consults: Delfin Edis, [facility]  Discharge Diagnoses: Diastolic heart failure Present on Admission:  .GIB (gastrointestinal bleeding) .Hypotension (arterial) .Other and unspecified coagulation defects .Atrial fibrillation with controlled ventricular response .CAD (coronary artery disease), native coronary artery .ASCVD (arteriosclerotic cardiovascular disease) .Atrial fibrillation .ARF (acute renal failure) .Healthcare-associated pneumonia Generalized weakness    Current Discharge Medication List    START taking these medications   Details  levofloxacin (LEVAQUIN) 500 MG tablet Take 1 tablet (500 mg total) by mouth daily. Qty: 3 tablet, Refills: 0    omeprazole (PRILOSEC) 20 MG capsule Take 1 capsule (20 mg total) by mouth daily. Qty: 30 capsule, Refills: 0      CONTINUE these medications which have NOT CHANGED   Details  !! furosemide (LASIX) 20 MG tablet Take 20 mg by mouth daily. Takes 40 mg PO every morning and 20 mg daily at dinner time     !! furosemide (LASIX) 40 MG tablet Take 40 mg by mouth daily.      metoprolol succinate (TOPROL XL) 25 MG 24 hr tablet Take 1 tablet (25 mg total) by mouth daily. Qty: 15 tablet, Refills: 1    potassium chloride SA (K-DUR,KLOR-CON) 20 MEQ tablet Take 40 mEq by mouth daily.      simvastatin (ZOCOR) 20 MG tablet Take 1 tablet by mouth Daily.    SYNTHROID 75 MCG tablet Take 1 tablet by mouth Daily.     !! - Potential duplicate medications found. Please discuss with provider.     Coumadin has been held for the next one week.    Hospital Course: Present on Admission:  .GIB (gastrointestinal bleeding): Patient is an 77 year old white male with past medical history of diastolic heart failure, atrial  fibrillation, chronic kidney disease and CAD who presented to the emergency room on 11/21 after having bloody bowel movements x2 days. The patient is on Coumadin and has had problems in the past controlling it. He initially had called his cardiologist and was told to come in. It was found that his hemoglobin was 7 and his INR 3.8. The patient was in then immediately moved to the emergency room.  Patient was admitted to the hospitalist service and placed in the ICU. He was started on continuous Protonix infusion.  He was transfused 2 units packed red blood cells and made n.p.o.  [facility] was consulted and the patient required reversal of coagulopathy 40 to be taken for EGD. In fact the following morning after admission, his INR had actually increased to 4.25.  The patient was given 2 units of fresh frozen plasma and more blood. He then underwent EGD on 11/21 which noted a nonbleeding ulcer/clot at the prepyloric antrum in the stomach. It was injected with epinephrine x3. Post procedure, patient had no further episodes of GI bleeding. His Coumadin was discontinued since admission.  During the rest of his hospitalization, the patient received 2 more units packed red blood cells overall boost his anemia. This was done because of hypotension and rapid atrial fibrillation. Patient is going to be discharged on Prilosec over-the-counter and is already off Coumadin for the past 7 days is recommended to not restart Coumadin for another 7 days.    .Hypotension (arterial): Felt to be secondary to GI bleed. Now resolved.  .Other and unspecified coagulation defects: Patient at times has had  a difficult time controlling his Coumadin, which may be in part to at times volume overload leading to secondary hepatic congestion. Once his INR was reversed, he is currently off Coumadin. See above  .Atrial fibrillation with controlled ventricular response: After endoscopy, patient went into an atrial fibrillation with rapid  ventricular rate. Using blood and fluids for restoring his blood pressure, and then restarting his beta blocker was able to keep his heart rate controlled. His baseline heart rate is still higher at this time in part from pneumonia and new exercise and mild anemia. Again, we'll restart Coumadin next week.  Marland KitchenCAD (coronary artery disease), native coronary artery: Stable at this time.  .ARF (acute renal failure) in the setting of chronic kidney disease stage III. Initially with blood loss patient had an increase in his creatinine. This improved with blood transfusions. He then started showing signs of volume overload, requiring extra doses of Lasix. As of day of discharge his creatinine is 2.12.  Marland KitchenHealthcare-associated pneumonia: The hospital day 3, patient was having no more GI bleeding. He was complaining of some shortness of breath and cough. We suspected volume overload and restarting diuretics. He then on the evening of hospital day 3, had an elevated temperature of 101. Chest x-ray confirmed infiltrate and/or edema. Patient was started on IV antibiotics for healthcare acquired pneumonia. He has had several days of this and has since been stable. He is transitioned over to 3 more days of Levaquin for a total of 7 days of therapy.  Acute on chronic diastolic heart failure.: This is felt to be secondary to aggressive volume repletion with IV fluids and blood. Patient required large doses of IV Lasix and diuresed several liters. Currently his breathing comfortably off oxygen even with ambulation.   Day of Discharge BP 110/57  Pulse 72  Temp(Src) 98.3 F (36.8 C) (Oral)  Resp 20  Ht 5\' 5"  (1.651 m)  Wt 75.7 kg (166 lb 14.2 oz)  BMI 27.77 kg/m2  SpO2 98%  Physical Exam: General: Alert and oriented x3 in no apparent distress Cardiovascular: Irregular rhythm, rate controlled Lungs: Clear to auscultation bilaterally Abdomen: Soft, nontender, nondistended positive bowel sounds Extremities: No  clubbing cyanosis or edema  Results for orders placed during the hospital encounter of 06/30/11 (from the past 24 hour(s))  CBC     Status: Abnormal   Collection Time   07/07/11  8:53 AM      Component Value Range   WBC 8.6  4.0 - 10.5 (K/uL)   RBC 3.15 (*) 4.22 - 5.81 (MIL/uL)   Hemoglobin 9.5 (*) 13.0 - 17.0 (g/dL)   HCT 28.2 (*) 39.0 - 52.0 (%)   MCV 89.5  78.0 - 100.0 (fL)   MCH 30.2  26.0 - 34.0 (pg)   MCHC 33.7  30.0 - 36.0 (g/dL)   RDW 15.5  11.5 - 15.5 (%)   Platelets 225  150 - 400 (K/uL)  BASIC METABOLIC PANEL     Status: Abnormal   Collection Time   07/07/11  8:53 AM      Component Value Range   Sodium 135  135 - 145 (mEq/L)   Potassium 3.5  3.5 - 5.1 (mEq/L)   Chloride 103  96 - 112 (mEq/L)   CO2 22  19 - 32 (mEq/L)   Glucose, Bld 147 (*) 70 - 99 (mg/dL)   BUN 42 (*) 6 - 23 (mg/dL)   Creatinine, Ser 2.12 (*) 0.50 - 1.35 (mg/dL)   Calcium 8.1 (*) 8.4 -  10.5 (mg/dL)   GFR calc non Af Amer 26 (*) >90 (mL/min)   GFR calc Af Amer 30 (*) >90 (mL/min)    Disposition: Improved, being discharged to home. He's been evaluated by physical and occupational therapy here will recommending home health PT OT.   Follow-up Appts: Discharge Orders    Future Appointments: Provider: Department: Dept Phone: Center:   07/14/2011 9:35 AM Tiffany Hollace Kinnier, RN [facility] [phone] None     Future Orders Please Complete By Expires   Diet - low sodium heart healthy      Increase activity slowly      Walk with assistance      Walker          Follow-up with Dr. Alyson Ingles, PCP in 2 weeks. Followup with Dr.Nahser, his cardiologist in one week.  Tests Needing Follow-up: None  Signed: KRISHNAN,SENDIL K 07/07/2011, 2:32 PM

## 2011-07-07 NOTE — Progress Notes (Signed)
Seen and agree with SPT note Lanetta Inch, [place]

## 2011-07-07 NOTE — Progress Notes (Signed)
   CARE MANAGEMENT NOTE 07/07/2011  Patient:  [name], [name]   Account Number:  [id number]  Date Initiated:  07/01/2011  Documentation initiated by:  Tomi Bamberger  Subjective/Objective Assessment:   dx GIB, CHF  admit-lives with spouse.     Action/Plan:   prbc/ffp, gi following   Anticipated DC Date:  07/07/2011   Anticipated DC Plan:  [place]  CM consult      [facility] Choice  HOME HEALTH   Choice offered to / List presented to:  C-1 Patient        [place] arranged  HH-2 PT      [place]   Status of service:  Completed, signed off Medicare Important Message given?   (If response is "NO", the following Medicare IM given date fields will be blank) Date Medicare IM given:   Date Additional Medicare IM given:    Discharge Disposition:  [place]  Per UR Regulation:  Reviewed for med. necessity/level of care/duration of stay  Comments:  PCP [place] (daughter) [address] Crecencio Mc (daughter) [phone]  07/07/11 11:30 Tomi Bamberger RN, BSN [phone] Patient chose [facility] from agency list for hhpt, pateint for dc today.  Referral sent to [facility] via [facility] and [place] notified.  Soc will begin 24-48 hrs post discharge.  07/01/11 12:03 Tomi Bamberger RN, BSN [phone] Patient lives with spouse, pta independent.

## 2011-07-08 ENCOUNTER — Telehealth: Payer: Self-pay | Admitting: Cardiovascular Disease

## 2011-07-08 NOTE — Telephone Encounter (Signed)
New Msg: Pt daughter calling to schedule EPH following hospital D/C. Pt was told to see Dr. Acie Fredrickson in one week however Dr. Acie Fredrickson soonest appt is not until 12/17. Pt daughter wants to know if it is acceptable for pt to wait that long to FU with MD. Please return pt daughter call to discuss further.

## 2011-07-08 NOTE — Telephone Encounter (Signed)
Spoke with daughter and app made to f/u with James gerhardt np

## 2011-07-09 LAB — CULTURE, BLOOD (ROUTINE X 2): Culture  Setup Time: 201211230304

## 2011-07-10 ENCOUNTER — Encounter: Payer: Self-pay | Admitting: Nurse Practitioner

## 2011-07-13 ENCOUNTER — Ambulatory Visit (INDEPENDENT_AMBULATORY_CARE_PROVIDER_SITE_OTHER): Payer: Medicare Other | Admitting: Nurse Practitioner

## 2011-07-13 ENCOUNTER — Encounter: Payer: Self-pay | Admitting: Nurse Practitioner

## 2011-07-13 VITALS — BP 116/52 | HR 66 | Ht 65.0 in | Wt 172.0 lb

## 2011-07-13 DIAGNOSIS — I5032 Chronic diastolic (congestive) heart failure: Secondary | ICD-10-CM | POA: Insufficient documentation

## 2011-07-13 DIAGNOSIS — I5030 Unspecified diastolic (congestive) heart failure: Secondary | ICD-10-CM

## 2011-07-13 DIAGNOSIS — K922 Gastrointestinal hemorrhage, unspecified: Secondary | ICD-10-CM

## 2011-07-13 DIAGNOSIS — I959 Hypotension, unspecified: Secondary | ICD-10-CM

## 2011-07-13 DIAGNOSIS — I4891 Unspecified atrial fibrillation: Secondary | ICD-10-CM

## 2011-07-13 DIAGNOSIS — N179 Acute kidney failure, unspecified: Secondary | ICD-10-CM

## 2011-07-13 LAB — BASIC METABOLIC PANEL
BUN: 54 mg/dL — ABNORMAL HIGH (ref 6–23)
CO2: 26 mEq/L (ref 19–32)
Calcium: 8.3 mg/dL — ABNORMAL LOW (ref 8.4–10.5)
Chloride: 103 mEq/L (ref 96–112)
Creatinine, Ser: 2.6 mg/dL — ABNORMAL HIGH (ref 0.4–1.5)
GFR: 25.04 mL/min — ABNORMAL LOW (ref >60.00)
Glucose, Bld: 115 mg/dL — ABNORMAL HIGH (ref 70–99)
Potassium: 5.3 mEq/L — ABNORMAL HIGH (ref 3.5–5.1)
Sodium: 136 mEq/L (ref 135–145)

## 2011-07-13 LAB — CBC WITH DIFFERENTIAL/PLATELET
Basophils Absolute: 0 10*3/uL (ref 0.0–0.1)
Basophils Relative: 0.4 % (ref 0.0–3.0)
Eosinophils Absolute: 0.5 10*3/uL (ref 0.0–0.7)
Eosinophils Relative: 6.3 % — ABNORMAL HIGH (ref 0.0–5.0)
HCT: 27.2 % — ABNORMAL LOW (ref 39.0–52.0)
Hemoglobin: 9.3 g/dL — ABNORMAL LOW (ref 13.0–17.0)
Lymphocytes Relative: 17.3 % (ref 12.0–46.0)
Lymphs Abs: 1.3 10*3/uL (ref 0.7–4.0)
MCHC: 34 g/dL (ref 30.0–36.0)
MCV: 91.4 fl (ref 78.0–100.0)
Monocytes Absolute: 0.8 10*3/uL (ref 0.1–1.0)
Monocytes Relative: 10.3 % (ref 3.0–12.0)
Neutro Abs: 4.9 10*3/uL (ref 1.4–7.7)
Neutrophils Relative %: 65.7 % (ref 43.0–77.0)
Platelets: 280 10*3/uL (ref 150.0–400.0)
RBC: 2.98 Mil/uL — ABNORMAL LOW (ref 4.22–5.81)
RDW: 15.1 % — ABNORMAL HIGH (ref 11.5–14.6)
WBC: 7.5 10*3/uL (ref 4.5–10.5)

## 2011-07-13 MED ORDER — FUROSEMIDE 40 MG PO TABS: 40.0000 mg | ORAL_TABLET | Freq: Two times a day (BID) | ORAL | Status: DC

## 2011-07-13 NOTE — Assessment & Plan Note (Signed)
We are rechecking his labs today.

## 2011-07-13 NOTE — Assessment & Plan Note (Signed)
Rate is controlled. He is to restart his coumadin tomorrow.

## 2011-07-13 NOTE — Assessment & Plan Note (Signed)
He has more swelling on exam. I have increased his Lasix to 40 mg BID. I will see him back in a week. We are rechecking his labs today and I will recheck his labs on return. Patient is agreeable to this plan and will call if any problems develop in the interim.

## 2011-07-13 NOTE — Patient Instructions (Signed)
Increase your Lasix to 40 mg twice a day - morning and late afternoon.  Keep elevating your legs.  We are going to check your labs today.   I will see you back in a week.  Restart your coumadin tomorrow.   We will check your coumadin and your chemistries in one week.  Call the [facility] office at [phone] if you have any questions, problems or concerns.

## 2011-07-13 NOTE — Assessment & Plan Note (Signed)
Blood pressure is satisfactory today.

## 2011-07-13 NOTE — Assessment & Plan Note (Signed)
No evidence of bleeding. Still feels a little weak. We will recheck his labs today.
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
## 2011-07-14 ENCOUNTER — Encounter: Payer: Medicare Other | Admitting: *Deleted

## 2011-07-15 ENCOUNTER — Telehealth: Payer: Self-pay | Admitting: *Deleted

## 2011-07-15 DIAGNOSIS — E875 Hyperkalemia: Secondary | ICD-10-CM

## 2011-07-15 NOTE — Progress Notes (Signed)
12/10 is ok.   Thanks! Cecille Rubin

## 2011-07-15 NOTE — Telephone Encounter (Signed)
Pt called and will have bmet on Friday. He will see pcp next Wednesday and will be referred to nephrology, told to call back if any problems with referral.

## 2011-07-17 ENCOUNTER — Telehealth: Payer: Self-pay | Admitting: Cardiovascular Disease

## 2011-07-17 ENCOUNTER — Telehealth: Payer: Self-pay | Admitting: *Deleted

## 2011-07-17 ENCOUNTER — Ambulatory Visit (INDEPENDENT_AMBULATORY_CARE_PROVIDER_SITE_OTHER): Payer: Medicare Other | Admitting: *Deleted

## 2011-07-17 DIAGNOSIS — I4891 Unspecified atrial fibrillation: Secondary | ICD-10-CM

## 2011-07-17 DIAGNOSIS — E875 Hyperkalemia: Secondary | ICD-10-CM

## 2011-07-17 LAB — BASIC METABOLIC PANEL
Calcium: 8.4 mg/dL (ref 8.4–10.5)
GFR: 28.59 mL/min — ABNORMAL LOW (ref >60.00)
Glucose, Bld: 95 mg/dL (ref 70–99)
Sodium: 136 mEq/L (ref 135–145)

## 2011-07-17 NOTE — Telephone Encounter (Signed)
[name] came to office for repeat BMET and while here voiced concerns about Left Flank pain after eating.  Talked with Dr. Elmarie Shiley nurse and we advised pt to keep scheduled appt with primary care on 12/13 and to call back if he experiences and bleeding(pt recently admitted with GI Bleed).  Norberta Keens, LPN

## 2011-07-17 NOTE — Telephone Encounter (Signed)
New message:  Information only:appt here on Monday and the nurse wanted you to know that pt is having vague symptoms of abdominal pain increased feeling of fullness.

## 2011-07-20 ENCOUNTER — Ambulatory Visit (INDEPENDENT_AMBULATORY_CARE_PROVIDER_SITE_OTHER): Payer: Medicare Other | Admitting: *Deleted

## 2011-07-20 ENCOUNTER — Ambulatory Visit (INDEPENDENT_AMBULATORY_CARE_PROVIDER_SITE_OTHER): Payer: Medicare Other | Admitting: Nurse Practitioner

## 2011-07-20 ENCOUNTER — Encounter: Payer: Self-pay | Admitting: Nurse Practitioner

## 2011-07-20 VITALS — BP 120/60 | HR 72 | Ht 65.0 in | Wt 164.0 lb

## 2011-07-20 DIAGNOSIS — K922 Gastrointestinal hemorrhage, unspecified: Secondary | ICD-10-CM

## 2011-07-20 DIAGNOSIS — I4891 Unspecified atrial fibrillation: Secondary | ICD-10-CM

## 2011-07-20 DIAGNOSIS — I5030 Unspecified diastolic (congestive) heart failure: Secondary | ICD-10-CM

## 2011-07-20 DIAGNOSIS — I2510 Atherosclerotic heart disease of native coronary artery without angina pectoris: Secondary | ICD-10-CM

## 2011-07-20 DIAGNOSIS — N179 Acute kidney failure, unspecified: Secondary | ICD-10-CM

## 2011-07-20 DIAGNOSIS — D689 Coagulation defect, unspecified: Secondary | ICD-10-CM

## 2011-07-20 LAB — BASIC METABOLIC PANEL
BUN: 42 mg/dL — ABNORMAL HIGH (ref 6–23)
Creatinine, Ser: 2.6 mg/dL — ABNORMAL HIGH (ref 0.4–1.5)
GFR: 25.27 mL/min — ABNORMAL LOW (ref >60.00)
Potassium: 4.3 mEq/L (ref 3.5–5.1)

## 2011-07-20 NOTE — Assessment & Plan Note (Signed)
Rate is controlled. He is back on coumadin.

## 2011-07-20 NOTE — Assessment & Plan Note (Signed)
Has a GFR of 28. May need to see Renal. He is seeing his PCP on Wednesday to discuss.

## 2011-07-20 NOTE — Assessment & Plan Note (Signed)
I have left him on his current regimen. His weight is down. We will see what his labs look like.

## 2011-07-20 NOTE — Assessment & Plan Note (Signed)
Denies bleeding. Still a little weak. Rechecking labs today.
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
## 2011-07-20 NOTE — Patient Instructions (Signed)
Keep your legs elevated.  Stay on your current medicines.  We will see what your labs look like today.  I will have you see Dr. Acie Fredrickson in a month.  Call the [facility] office at [phone] if you have any questions, problems or concerns.

## 2011-07-21 MED ORDER — FUROSEMIDE 40 MG PO TABS: ORAL_TABLET | ORAL | Status: DC

## 2011-07-21 NOTE — Patient Instructions (Signed)
Spoke with pt and has f/u with pcp tomorrow, labs forwarded.

## 2011-07-24 ENCOUNTER — Ambulatory Visit (INDEPENDENT_AMBULATORY_CARE_PROVIDER_SITE_OTHER): Payer: Medicare Other | Admitting: *Deleted

## 2011-07-24 DIAGNOSIS — I2510 Atherosclerotic heart disease of native coronary artery without angina pectoris: Secondary | ICD-10-CM

## 2011-07-24 DIAGNOSIS — I4891 Unspecified atrial fibrillation: Secondary | ICD-10-CM

## 2011-07-24 DIAGNOSIS — K922 Gastrointestinal hemorrhage, unspecified: Secondary | ICD-10-CM

## 2011-07-24 DIAGNOSIS — D689 Coagulation defect, unspecified: Secondary | ICD-10-CM

## 2011-07-24 LAB — POCT INR: INR: 1.5

## 2011-07-31 ENCOUNTER — Ambulatory Visit (INDEPENDENT_AMBULATORY_CARE_PROVIDER_SITE_OTHER): Payer: Medicare Other | Admitting: *Deleted

## 2011-07-31 DIAGNOSIS — K922 Gastrointestinal hemorrhage, unspecified: Secondary | ICD-10-CM

## 2011-07-31 DIAGNOSIS — D689 Coagulation defect, unspecified: Secondary | ICD-10-CM

## 2011-07-31 DIAGNOSIS — I2510 Atherosclerotic heart disease of native coronary artery without angina pectoris: Secondary | ICD-10-CM

## 2011-07-31 DIAGNOSIS — I4891 Unspecified atrial fibrillation: Secondary | ICD-10-CM

## 2011-07-31 LAB — POCT INR: INR: 1.8

## 2011-08-10 ENCOUNTER — Ambulatory Visit (INDEPENDENT_AMBULATORY_CARE_PROVIDER_SITE_OTHER): Payer: Medicare Other | Admitting: *Deleted

## 2011-08-10 DIAGNOSIS — D689 Coagulation defect, unspecified: Secondary | ICD-10-CM

## 2011-08-10 DIAGNOSIS — K922 Gastrointestinal hemorrhage, unspecified: Secondary | ICD-10-CM

## 2011-08-10 DIAGNOSIS — I2510 Atherosclerotic heart disease of native coronary artery without angina pectoris: Secondary | ICD-10-CM

## 2011-08-10 DIAGNOSIS — I4891 Unspecified atrial fibrillation: Secondary | ICD-10-CM

## 2011-08-11 DIAGNOSIS — K284 Chronic or unspecified gastrojejunal ulcer with hemorrhage: Secondary | ICD-10-CM

## 2011-08-11 DIAGNOSIS — K274 Chronic or unspecified peptic ulcer, site unspecified, with hemorrhage: Secondary | ICD-10-CM

## 2011-08-11 HISTORY — DX: Chronic or unspecified gastrojejunal ulcer with hemorrhage: K28.4

## 2011-08-11 HISTORY — DX: Chronic or unspecified peptic ulcer, site unspecified, with hemorrhage: K27.4

## 2011-08-26 ENCOUNTER — Ambulatory Visit (INDEPENDENT_AMBULATORY_CARE_PROVIDER_SITE_OTHER): Payer: Medicare Other | Admitting: *Deleted

## 2011-08-26 ENCOUNTER — Ambulatory Visit (INDEPENDENT_AMBULATORY_CARE_PROVIDER_SITE_OTHER): Payer: Medicare Other | Admitting: Cardiovascular Disease

## 2011-08-26 ENCOUNTER — Encounter: Payer: Self-pay | Admitting: Cardiovascular Disease

## 2011-08-26 VITALS — BP 118/57 | HR 64 | Ht 64.0 in | Wt 157.4 lb

## 2011-08-26 DIAGNOSIS — I4891 Unspecified atrial fibrillation: Secondary | ICD-10-CM | POA: Diagnosis not present

## 2011-08-26 DIAGNOSIS — I2510 Atherosclerotic heart disease of native coronary artery without angina pectoris: Secondary | ICD-10-CM | POA: Diagnosis not present

## 2011-08-26 DIAGNOSIS — D689 Coagulation defect, unspecified: Secondary | ICD-10-CM

## 2011-08-26 DIAGNOSIS — K922 Gastrointestinal hemorrhage, unspecified: Secondary | ICD-10-CM

## 2011-08-26 MED ORDER — PROPRANOLOL HCL 10 MG PO TABS: 10.0000 mg | ORAL_TABLET | Freq: Four times a day (QID) | ORAL | Status: DC | PRN

## 2011-08-26 NOTE — Patient Instructions (Signed)
Your physician wants you to follow-up in: 3 months You will receive a reminder letter in the mail two months in advance. If you don't receive a letter, please call our office to schedule the follow-up appointment.  Your physician recommends that you continue on your current medications as directed. Please refer to the Current Medication list given to you today.
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
## 2011-08-26 NOTE — Assessment & Plan Note (Signed)
He remains on chronic Coumadin therapy. We'll check his INR today. He seems to be fairly stable.

## 2011-08-26 NOTE — Assessment & Plan Note (Signed)
He's currently stable. He's not having any episodes of angina.

## 2011-09-02 ENCOUNTER — Encounter: Payer: Self-pay | Admitting: Cardiovascular Disease

## 2011-09-02 DIAGNOSIS — E039 Hypothyroidism, unspecified: Secondary | ICD-10-CM | POA: Diagnosis not present

## 2011-09-02 DIAGNOSIS — Z0000 Encounter for general adult medical examination without abnormal findings: Secondary | ICD-10-CM | POA: Diagnosis not present

## 2011-09-02 DIAGNOSIS — E559 Vitamin D deficiency, unspecified: Secondary | ICD-10-CM | POA: Diagnosis not present

## 2011-09-02 DIAGNOSIS — Z125 Encounter for screening for malignant neoplasm of prostate: Secondary | ICD-10-CM | POA: Diagnosis not present

## 2011-09-02 DIAGNOSIS — I509 Heart failure, unspecified: Secondary | ICD-10-CM | POA: Diagnosis not present

## 2011-09-02 DIAGNOSIS — I2510 Atherosclerotic heart disease of native coronary artery without angina pectoris: Secondary | ICD-10-CM | POA: Diagnosis not present

## 2011-09-02 DIAGNOSIS — I10 Essential (primary) hypertension: Secondary | ICD-10-CM | POA: Diagnosis not present

## 2011-09-02 DIAGNOSIS — Z23 Encounter for immunization: Secondary | ICD-10-CM | POA: Diagnosis not present

## 2011-09-09 DIAGNOSIS — E039 Hypothyroidism, unspecified: Secondary | ICD-10-CM | POA: Diagnosis not present

## 2011-09-09 DIAGNOSIS — I509 Heart failure, unspecified: Secondary | ICD-10-CM | POA: Diagnosis not present

## 2011-09-09 DIAGNOSIS — I2510 Atherosclerotic heart disease of native coronary artery without angina pectoris: Secondary | ICD-10-CM | POA: Diagnosis not present

## 2011-09-09 DIAGNOSIS — E785 Hyperlipidemia, unspecified: Secondary | ICD-10-CM | POA: Diagnosis not present

## 2011-09-09 DIAGNOSIS — I10 Essential (primary) hypertension: Secondary | ICD-10-CM | POA: Diagnosis not present

## 2011-09-11 ENCOUNTER — Ambulatory Visit (INDEPENDENT_AMBULATORY_CARE_PROVIDER_SITE_OTHER): Payer: Medicare Other | Admitting: *Deleted

## 2011-09-11 DIAGNOSIS — I2510 Atherosclerotic heart disease of native coronary artery without angina pectoris: Secondary | ICD-10-CM | POA: Diagnosis not present

## 2011-09-11 DIAGNOSIS — D689 Coagulation defect, unspecified: Secondary | ICD-10-CM | POA: Diagnosis not present

## 2011-09-11 DIAGNOSIS — I4891 Unspecified atrial fibrillation: Secondary | ICD-10-CM | POA: Diagnosis not present

## 2011-09-11 DIAGNOSIS — K922 Gastrointestinal hemorrhage, unspecified: Secondary | ICD-10-CM | POA: Diagnosis not present

## 2011-09-11 LAB — POCT INR: INR: 2.1

## 2011-09-14 ENCOUNTER — Other Ambulatory Visit: Payer: Self-pay | Admitting: Cardiology

## 2011-09-14 DIAGNOSIS — N183 Chronic kidney disease, stage 3 unspecified: Secondary | ICD-10-CM

## 2011-09-16 ENCOUNTER — Encounter: Payer: Medicare Other | Admitting: *Deleted

## 2011-09-16 ENCOUNTER — Encounter: Payer: Medicare Other | Admitting: Cardiology

## 2011-09-17 ENCOUNTER — Encounter: Payer: Self-pay | Admitting: Cardiovascular Disease

## 2011-09-17 DIAGNOSIS — N183 Chronic kidney disease, stage 3 unspecified: Secondary | ICD-10-CM | POA: Diagnosis not present

## 2011-09-21 ENCOUNTER — Encounter (INDEPENDENT_AMBULATORY_CARE_PROVIDER_SITE_OTHER): Payer: Medicare Other | Admitting: Cardiology

## 2011-09-21 ENCOUNTER — Other Ambulatory Visit: Payer: Self-pay | Admitting: *Deleted

## 2011-09-21 DIAGNOSIS — N183 Chronic kidney disease, stage 3 unspecified: Secondary | ICD-10-CM | POA: Diagnosis not present

## 2011-09-21 DIAGNOSIS — I700 Atherosclerosis of aorta: Secondary | ICD-10-CM | POA: Diagnosis not present

## 2011-09-21 MED ORDER — WARFARIN SODIUM 2.5 MG PO TABS: 2.5000 mg | ORAL_TABLET | Freq: Every day | ORAL | Status: DC

## 2011-09-23 ENCOUNTER — Encounter: Payer: Self-pay | Admitting: Cardiovascular Disease

## 2011-09-23 DIAGNOSIS — N184 Chronic kidney disease, stage 4 (severe): Secondary | ICD-10-CM | POA: Diagnosis not present

## 2011-09-23 DIAGNOSIS — I2510 Atherosclerotic heart disease of native coronary artery without angina pectoris: Secondary | ICD-10-CM | POA: Diagnosis not present

## 2011-09-23 DIAGNOSIS — D649 Anemia, unspecified: Secondary | ICD-10-CM | POA: Diagnosis not present

## 2011-09-23 DIAGNOSIS — D509 Iron deficiency anemia, unspecified: Secondary | ICD-10-CM | POA: Diagnosis not present

## 2011-09-23 DIAGNOSIS — I129 Hypertensive chronic kidney disease with stage 1 through stage 4 chronic kidney disease, or unspecified chronic kidney disease: Secondary | ICD-10-CM | POA: Diagnosis not present

## 2011-09-25 ENCOUNTER — Ambulatory Visit (INDEPENDENT_AMBULATORY_CARE_PROVIDER_SITE_OTHER): Payer: Medicare Other

## 2011-09-25 DIAGNOSIS — K922 Gastrointestinal hemorrhage, unspecified: Secondary | ICD-10-CM | POA: Diagnosis not present

## 2011-09-25 DIAGNOSIS — D689 Coagulation defect, unspecified: Secondary | ICD-10-CM

## 2011-09-25 DIAGNOSIS — I4891 Unspecified atrial fibrillation: Secondary | ICD-10-CM

## 2011-09-25 DIAGNOSIS — I2510 Atherosclerotic heart disease of native coronary artery without angina pectoris: Secondary | ICD-10-CM | POA: Diagnosis not present

## 2011-10-06 DIAGNOSIS — N401 Enlarged prostate with lower urinary tract symptoms: Secondary | ICD-10-CM | POA: Diagnosis not present

## 2011-10-06 DIAGNOSIS — N138 Other obstructive and reflux uropathy: Secondary | ICD-10-CM | POA: Diagnosis not present

## 2011-10-06 DIAGNOSIS — R972 Elevated prostate specific antigen [PSA]: Secondary | ICD-10-CM | POA: Diagnosis not present

## 2011-10-16 ENCOUNTER — Ambulatory Visit (INDEPENDENT_AMBULATORY_CARE_PROVIDER_SITE_OTHER): Payer: Medicare Other

## 2011-10-16 DIAGNOSIS — I4891 Unspecified atrial fibrillation: Secondary | ICD-10-CM

## 2011-10-16 DIAGNOSIS — K922 Gastrointestinal hemorrhage, unspecified: Secondary | ICD-10-CM

## 2011-10-16 DIAGNOSIS — D689 Coagulation defect, unspecified: Secondary | ICD-10-CM | POA: Diagnosis not present

## 2011-10-16 DIAGNOSIS — I2510 Atherosclerotic heart disease of native coronary artery without angina pectoris: Secondary | ICD-10-CM | POA: Diagnosis not present

## 2011-10-19 ENCOUNTER — Encounter: Payer: Self-pay | Admitting: Cardiovascular Disease

## 2011-10-19 ENCOUNTER — Telehealth: Payer: Self-pay | Admitting: Cardiovascular Disease

## 2011-10-19 ENCOUNTER — Telehealth: Payer: Self-pay | Admitting: *Deleted

## 2011-10-19 DIAGNOSIS — E785 Hyperlipidemia, unspecified: Secondary | ICD-10-CM

## 2011-10-19 DIAGNOSIS — N184 Chronic kidney disease, stage 4 (severe): Secondary | ICD-10-CM | POA: Diagnosis not present

## 2011-10-19 DIAGNOSIS — D649 Anemia, unspecified: Secondary | ICD-10-CM | POA: Diagnosis not present

## 2011-10-19 NOTE — Telephone Encounter (Signed)
App set for fasting labs

## 2011-10-19 NOTE — Telephone Encounter (Signed)
Walk In pt form " Pt Has Questions about labs, & Appt with Nahser" sent to [facility] 10/19/11/KM

## 2011-10-27 DIAGNOSIS — I4891 Unspecified atrial fibrillation: Secondary | ICD-10-CM | POA: Diagnosis not present

## 2011-10-27 DIAGNOSIS — D649 Anemia, unspecified: Secondary | ICD-10-CM | POA: Diagnosis not present

## 2011-10-27 DIAGNOSIS — I701 Atherosclerosis of renal artery: Secondary | ICD-10-CM | POA: Diagnosis not present

## 2011-10-27 DIAGNOSIS — I2510 Atherosclerotic heart disease of native coronary artery without angina pectoris: Secondary | ICD-10-CM | POA: Diagnosis not present

## 2011-11-13 ENCOUNTER — Ambulatory Visit (INDEPENDENT_AMBULATORY_CARE_PROVIDER_SITE_OTHER): Payer: Medicare Other | Admitting: Pharmacist

## 2011-11-13 ENCOUNTER — Other Ambulatory Visit (INDEPENDENT_AMBULATORY_CARE_PROVIDER_SITE_OTHER): Payer: Medicare Other

## 2011-11-13 DIAGNOSIS — D689 Coagulation defect, unspecified: Secondary | ICD-10-CM

## 2011-11-13 DIAGNOSIS — E785 Hyperlipidemia, unspecified: Secondary | ICD-10-CM

## 2011-11-13 DIAGNOSIS — K922 Gastrointestinal hemorrhage, unspecified: Secondary | ICD-10-CM | POA: Diagnosis not present

## 2011-11-13 DIAGNOSIS — I4891 Unspecified atrial fibrillation: Secondary | ICD-10-CM | POA: Diagnosis not present

## 2011-11-13 DIAGNOSIS — I2510 Atherosclerotic heart disease of native coronary artery without angina pectoris: Secondary | ICD-10-CM | POA: Diagnosis not present

## 2011-11-13 LAB — BASIC METABOLIC PANEL
CO2: 27 mEq/L (ref 19–32)
Chloride: 100 mEq/L (ref 96–112)
Potassium: 4.8 mEq/L (ref 3.5–5.1)
Sodium: 136 mEq/L (ref 135–145)

## 2011-11-13 LAB — LIPID PANEL
LDL Cholesterol: 82 mg/dL (ref 0–99)
Total CHOL/HDL Ratio: 3
Triglycerides: 72 mg/dL (ref 0.0–149.0)

## 2011-11-13 LAB — HEPATIC FUNCTION PANEL
ALT: 21 U/L (ref 0–53)
AST: 24 U/L (ref 0–37)
Bilirubin, Direct: 0.1 mg/dL (ref 0.0–0.3)
Total Protein: 6.9 g/dL (ref 6.0–8.3)

## 2011-11-13 NOTE — Progress Notes (Signed)
I cannot sign this as there is no return date

## 2011-11-24 ENCOUNTER — Ambulatory Visit (INDEPENDENT_AMBULATORY_CARE_PROVIDER_SITE_OTHER): Payer: Medicare Other | Admitting: Cardiovascular Disease

## 2011-11-24 ENCOUNTER — Encounter: Payer: Self-pay | Admitting: Cardiovascular Disease

## 2011-11-24 VITALS — BP 105/60 | HR 50 | Ht 63.0 in | Wt 161.8 lb

## 2011-11-24 DIAGNOSIS — R5383 Other fatigue: Secondary | ICD-10-CM | POA: Diagnosis not present

## 2011-11-24 DIAGNOSIS — I2510 Atherosclerotic heart disease of native coronary artery without angina pectoris: Secondary | ICD-10-CM

## 2011-11-24 DIAGNOSIS — I4891 Unspecified atrial fibrillation: Secondary | ICD-10-CM

## 2011-11-24 DIAGNOSIS — D649 Anemia, unspecified: Secondary | ICD-10-CM | POA: Diagnosis not present

## 2011-11-24 DIAGNOSIS — R5381 Other malaise: Secondary | ICD-10-CM

## 2011-11-24 DIAGNOSIS — R531 Weakness: Secondary | ICD-10-CM

## 2011-11-24 DIAGNOSIS — K922 Gastrointestinal hemorrhage, unspecified: Secondary | ICD-10-CM

## 2011-11-24 LAB — CBC WITH DIFFERENTIAL/PLATELET
Basophils Absolute: 0.1 10*3/uL (ref 0.0–0.1)
Eosinophils Absolute: 0.2 10*3/uL (ref 0.0–0.7)
MCHC: 33.2 g/dL (ref 30.0–36.0)
MCV: 91.6 fl (ref 78.0–100.0)
Monocytes Absolute: 0.7 10*3/uL (ref 0.1–1.0)
Neutrophils Relative %: 65.3 % (ref 43.0–77.0)
Platelets: 196 10*3/uL (ref 150.0–400.0)
RDW: 13.7 % (ref 11.5–14.6)

## 2011-11-24 NOTE — Assessment & Plan Note (Signed)
His last hematocrit was 29%. We'll check a CBC today. He will continue on iron therapy.

## 2011-11-24 NOTE — Assessment & Plan Note (Signed)
[name] is doing well. He's not had any episodes of chest pain. We'll continue the same medications.  He complains of generalized weakness. Has a history of anemia due to GI bleed. We will check his hemoglobin today. His symptoms do not sound like angina.

## 2011-11-24 NOTE — Patient Instructions (Signed)
Your physician wants you to follow-up in: 6 months  You will receive a reminder letter in the mail two months in advance. If you don't receive a letter, please call our office to schedule the follow-up appointment.  Your physician recommends that you return for a FASTING lipid profile: 6 months lipid hepatic bmet   Your physician recommends that you return for lab work in: 6 months cbc
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
## 2011-11-24 NOTE — Assessment & Plan Note (Signed)
His exam is consistent with sinus rhythm.  He has not had significant palpitations.  Continue current meds.

## 2011-12-11 ENCOUNTER — Ambulatory Visit (INDEPENDENT_AMBULATORY_CARE_PROVIDER_SITE_OTHER): Payer: Medicare Other | Admitting: *Deleted

## 2011-12-11 DIAGNOSIS — D689 Coagulation defect, unspecified: Secondary | ICD-10-CM | POA: Diagnosis not present

## 2011-12-11 DIAGNOSIS — I4891 Unspecified atrial fibrillation: Secondary | ICD-10-CM

## 2011-12-11 DIAGNOSIS — I2510 Atherosclerotic heart disease of native coronary artery without angina pectoris: Secondary | ICD-10-CM

## 2011-12-11 DIAGNOSIS — K922 Gastrointestinal hemorrhage, unspecified: Secondary | ICD-10-CM | POA: Diagnosis not present

## 2011-12-11 LAB — POCT INR: INR: 3.3

## 2012-01-01 ENCOUNTER — Ambulatory Visit (INDEPENDENT_AMBULATORY_CARE_PROVIDER_SITE_OTHER): Payer: Medicare Other | Admitting: *Deleted

## 2012-01-01 DIAGNOSIS — I2510 Atherosclerotic heart disease of native coronary artery without angina pectoris: Secondary | ICD-10-CM | POA: Diagnosis not present

## 2012-01-01 DIAGNOSIS — D689 Coagulation defect, unspecified: Secondary | ICD-10-CM

## 2012-01-01 DIAGNOSIS — I4891 Unspecified atrial fibrillation: Secondary | ICD-10-CM

## 2012-01-01 DIAGNOSIS — K922 Gastrointestinal hemorrhage, unspecified: Secondary | ICD-10-CM

## 2012-01-01 LAB — POCT INR: INR: 3.2

## 2012-01-06 DIAGNOSIS — H52209 Unspecified astigmatism, unspecified eye: Secondary | ICD-10-CM | POA: Diagnosis not present

## 2012-01-06 DIAGNOSIS — H35379 Puckering of macula, unspecified eye: Secondary | ICD-10-CM | POA: Diagnosis not present

## 2012-01-06 DIAGNOSIS — Z961 Presence of intraocular lens: Secondary | ICD-10-CM | POA: Diagnosis not present

## 2012-01-06 DIAGNOSIS — H53009 Unspecified amblyopia, unspecified eye: Secondary | ICD-10-CM | POA: Diagnosis not present

## 2012-01-11 ENCOUNTER — Ambulatory Visit (INDEPENDENT_AMBULATORY_CARE_PROVIDER_SITE_OTHER): Payer: Medicare Other | Admitting: Pharmacist

## 2012-01-11 DIAGNOSIS — I4891 Unspecified atrial fibrillation: Secondary | ICD-10-CM

## 2012-01-11 DIAGNOSIS — D689 Coagulation defect, unspecified: Secondary | ICD-10-CM

## 2012-01-11 DIAGNOSIS — K922 Gastrointestinal hemorrhage, unspecified: Secondary | ICD-10-CM | POA: Diagnosis not present

## 2012-01-11 DIAGNOSIS — I7090 Unspecified atherosclerosis: Secondary | ICD-10-CM

## 2012-01-11 DIAGNOSIS — I2510 Atherosclerotic heart disease of native coronary artery without angina pectoris: Secondary | ICD-10-CM | POA: Diagnosis not present

## 2012-01-13 ENCOUNTER — Telehealth: Payer: Self-pay | Admitting: Cardiovascular Disease

## 2012-01-13 DIAGNOSIS — I10 Essential (primary) hypertension: Secondary | ICD-10-CM

## 2012-01-13 DIAGNOSIS — R609 Edema, unspecified: Secondary | ICD-10-CM

## 2012-01-13 DIAGNOSIS — N179 Acute kidney failure, unspecified: Secondary | ICD-10-CM

## 2012-01-13 DIAGNOSIS — R5383 Other fatigue: Secondary | ICD-10-CM

## 2012-01-13 DIAGNOSIS — R0602 Shortness of breath: Secondary | ICD-10-CM

## 2012-01-13 NOTE — Telephone Encounter (Signed)
I spoke with the pt's wife and the pt continues to complain of fatigue.  She feels the pt is more fatigued than in April at his last appointment.  Pt has no other complaints per wife. She would like to know if the pt can have lab work drawn.  The pt is still taking an iron supplement and has not noticed had any blood in bowel movements. I will forward this message to Dr Acie Fredrickson for further orders.

## 2012-01-13 NOTE — Telephone Encounter (Signed)
He should consult his primary medical doctor.  We can also do lab work - CBC, BMP, BNP

## 2012-01-13 NOTE — Telephone Encounter (Signed)
Patient is aware to consult his PCP to his fatigue. Pt would like to have lab work here in the office tomorrow. Patient has an appointment for CBC, BMP and  BNP tomorrow.

## 2012-01-13 NOTE — Telephone Encounter (Signed)
New msg Pt's wife called and said he was fatigued and wanted to know if he should come in to get some blood work.

## 2012-01-14 ENCOUNTER — Other Ambulatory Visit (INDEPENDENT_AMBULATORY_CARE_PROVIDER_SITE_OTHER): Payer: Medicare Other

## 2012-01-14 DIAGNOSIS — I10 Essential (primary) hypertension: Secondary | ICD-10-CM | POA: Diagnosis not present

## 2012-01-14 DIAGNOSIS — R0602 Shortness of breath: Secondary | ICD-10-CM

## 2012-01-14 DIAGNOSIS — R5381 Other malaise: Secondary | ICD-10-CM | POA: Diagnosis not present

## 2012-01-14 DIAGNOSIS — R5383 Other fatigue: Secondary | ICD-10-CM

## 2012-01-14 DIAGNOSIS — N179 Acute kidney failure, unspecified: Secondary | ICD-10-CM | POA: Diagnosis not present

## 2012-01-14 LAB — CBC WITH DIFFERENTIAL/PLATELET
Basophils Relative: 0.9 % (ref 0.0–3.0)
Eosinophils Relative: 3.8 % (ref 0.0–5.0)
Lymphocytes Relative: 22.7 % (ref 12.0–46.0)
MCV: 92 fl (ref 78.0–100.0)
Monocytes Absolute: 0.6 10*3/uL (ref 0.1–1.0)
Neutrophils Relative %: 62.9 % (ref 43.0–77.0)
Platelets: 150 10*3/uL (ref 150.0–400.0)
RBC: 3.04 Mil/uL — ABNORMAL LOW (ref 4.22–5.81)
WBC: 5.7 10*3/uL (ref 4.5–10.5)

## 2012-01-14 LAB — BASIC METABOLIC PANEL
BUN: 64 mg/dL — ABNORMAL HIGH (ref 6–23)
Calcium: 8.5 mg/dL (ref 8.4–10.5)
Chloride: 103 mEq/L (ref 96–112)
Creatinine, Ser: 3 mg/dL — ABNORMAL HIGH (ref 0.4–1.5)
GFR: 21.02 mL/min — ABNORMAL LOW (ref >60.00)

## 2012-01-14 LAB — BRAIN NATRIURETIC PEPTIDE: Pro B Natriuretic peptide (BNP): 195 pg/mL — ABNORMAL HIGH (ref 0.0–100.0)

## 2012-01-22 ENCOUNTER — Telehealth: Payer: Self-pay | Admitting: Cardiovascular Disease

## 2012-01-22 NOTE — Telephone Encounter (Signed)
Was sent prior per note and routing status, will resend. Pt notified and Dr Alyson Ingles office notified/ angela

## 2012-01-22 NOTE — Telephone Encounter (Signed)
She needs [place] to send the lab results to pt PCP before 12 noon today because that is when they leave

## 2012-01-25 ENCOUNTER — Ambulatory Visit (INDEPENDENT_AMBULATORY_CARE_PROVIDER_SITE_OTHER): Payer: Medicare Other

## 2012-01-25 DIAGNOSIS — D689 Coagulation defect, unspecified: Secondary | ICD-10-CM | POA: Diagnosis not present

## 2012-01-25 DIAGNOSIS — I4891 Unspecified atrial fibrillation: Secondary | ICD-10-CM

## 2012-01-25 DIAGNOSIS — K922 Gastrointestinal hemorrhage, unspecified: Secondary | ICD-10-CM

## 2012-01-25 DIAGNOSIS — I2510 Atherosclerotic heart disease of native coronary artery without angina pectoris: Secondary | ICD-10-CM | POA: Diagnosis not present

## 2012-01-25 DIAGNOSIS — I7090 Unspecified atherosclerosis: Secondary | ICD-10-CM | POA: Diagnosis not present

## 2012-01-25 LAB — POCT INR: INR: 2.8

## 2012-01-29 ENCOUNTER — Encounter: Payer: Self-pay | Admitting: Cardiovascular Disease

## 2012-01-29 DIAGNOSIS — N183 Chronic kidney disease, stage 3 unspecified: Secondary | ICD-10-CM | POA: Diagnosis not present

## 2012-02-02 DIAGNOSIS — I252 Old myocardial infarction: Secondary | ICD-10-CM | POA: Diagnosis not present

## 2012-02-02 DIAGNOSIS — R262 Difficulty in walking, not elsewhere classified: Secondary | ICD-10-CM | POA: Diagnosis not present

## 2012-02-02 DIAGNOSIS — M25559 Pain in unspecified hip: Secondary | ICD-10-CM | POA: Diagnosis not present

## 2012-02-02 DIAGNOSIS — M6281 Muscle weakness (generalized): Secondary | ICD-10-CM | POA: Diagnosis not present

## 2012-02-02 DIAGNOSIS — D509 Iron deficiency anemia, unspecified: Secondary | ICD-10-CM | POA: Diagnosis not present

## 2012-02-08 DIAGNOSIS — I252 Old myocardial infarction: Secondary | ICD-10-CM | POA: Diagnosis not present

## 2012-02-08 DIAGNOSIS — M25559 Pain in unspecified hip: Secondary | ICD-10-CM | POA: Diagnosis not present

## 2012-02-08 DIAGNOSIS — M6281 Muscle weakness (generalized): Secondary | ICD-10-CM | POA: Diagnosis not present

## 2012-02-08 DIAGNOSIS — R262 Difficulty in walking, not elsewhere classified: Secondary | ICD-10-CM | POA: Diagnosis not present

## 2012-02-08 DIAGNOSIS — D509 Iron deficiency anemia, unspecified: Secondary | ICD-10-CM | POA: Diagnosis not present

## 2012-02-12 DIAGNOSIS — N183 Chronic kidney disease, stage 3 unspecified: Secondary | ICD-10-CM | POA: Diagnosis not present

## 2012-02-22 ENCOUNTER — Ambulatory Visit (INDEPENDENT_AMBULATORY_CARE_PROVIDER_SITE_OTHER): Payer: Medicare Other | Admitting: *Deleted

## 2012-02-22 DIAGNOSIS — I2510 Atherosclerotic heart disease of native coronary artery without angina pectoris: Secondary | ICD-10-CM

## 2012-02-22 DIAGNOSIS — I4891 Unspecified atrial fibrillation: Secondary | ICD-10-CM

## 2012-02-22 DIAGNOSIS — I7090 Unspecified atherosclerosis: Secondary | ICD-10-CM

## 2012-02-22 DIAGNOSIS — K922 Gastrointestinal hemorrhage, unspecified: Secondary | ICD-10-CM

## 2012-02-22 DIAGNOSIS — D689 Coagulation defect, unspecified: Secondary | ICD-10-CM

## 2012-02-22 LAB — POCT INR: INR: 3.7

## 2012-03-04 ENCOUNTER — Ambulatory Visit (INDEPENDENT_AMBULATORY_CARE_PROVIDER_SITE_OTHER): Payer: Medicare Other | Admitting: Pharmacist

## 2012-03-04 DIAGNOSIS — I7090 Unspecified atherosclerosis: Secondary | ICD-10-CM | POA: Diagnosis not present

## 2012-03-04 DIAGNOSIS — D689 Coagulation defect, unspecified: Secondary | ICD-10-CM

## 2012-03-04 DIAGNOSIS — N183 Chronic kidney disease, stage 3 unspecified: Secondary | ICD-10-CM | POA: Diagnosis not present

## 2012-03-04 DIAGNOSIS — K922 Gastrointestinal hemorrhage, unspecified: Secondary | ICD-10-CM | POA: Diagnosis not present

## 2012-03-04 DIAGNOSIS — I4891 Unspecified atrial fibrillation: Secondary | ICD-10-CM

## 2012-03-04 DIAGNOSIS — I2510 Atherosclerotic heart disease of native coronary artery without angina pectoris: Secondary | ICD-10-CM

## 2012-03-08 DIAGNOSIS — I2510 Atherosclerotic heart disease of native coronary artery without angina pectoris: Secondary | ICD-10-CM | POA: Diagnosis not present

## 2012-03-08 DIAGNOSIS — E785 Hyperlipidemia, unspecified: Secondary | ICD-10-CM | POA: Diagnosis not present

## 2012-03-08 DIAGNOSIS — I10 Essential (primary) hypertension: Secondary | ICD-10-CM | POA: Diagnosis not present

## 2012-03-08 DIAGNOSIS — E039 Hypothyroidism, unspecified: Secondary | ICD-10-CM | POA: Diagnosis not present

## 2012-03-10 DIAGNOSIS — D509 Iron deficiency anemia, unspecified: Secondary | ICD-10-CM | POA: Diagnosis not present

## 2012-03-10 DIAGNOSIS — R262 Difficulty in walking, not elsewhere classified: Secondary | ICD-10-CM | POA: Diagnosis not present

## 2012-03-10 DIAGNOSIS — M25559 Pain in unspecified hip: Secondary | ICD-10-CM | POA: Diagnosis not present

## 2012-03-10 DIAGNOSIS — M6281 Muscle weakness (generalized): Secondary | ICD-10-CM | POA: Diagnosis not present

## 2012-03-10 DIAGNOSIS — I252 Old myocardial infarction: Secondary | ICD-10-CM | POA: Diagnosis not present

## 2012-03-14 DIAGNOSIS — M25559 Pain in unspecified hip: Secondary | ICD-10-CM | POA: Diagnosis not present

## 2012-03-14 DIAGNOSIS — R262 Difficulty in walking, not elsewhere classified: Secondary | ICD-10-CM | POA: Diagnosis not present

## 2012-03-14 DIAGNOSIS — Z1212 Encounter for screening for malignant neoplasm of rectum: Secondary | ICD-10-CM | POA: Diagnosis not present

## 2012-03-14 DIAGNOSIS — D509 Iron deficiency anemia, unspecified: Secondary | ICD-10-CM | POA: Diagnosis not present

## 2012-03-14 DIAGNOSIS — M6281 Muscle weakness (generalized): Secondary | ICD-10-CM | POA: Diagnosis not present

## 2012-03-14 DIAGNOSIS — I252 Old myocardial infarction: Secondary | ICD-10-CM | POA: Diagnosis not present

## 2012-03-18 ENCOUNTER — Ambulatory Visit (INDEPENDENT_AMBULATORY_CARE_PROVIDER_SITE_OTHER): Payer: Medicare Other | Admitting: Pharmacist

## 2012-03-18 DIAGNOSIS — I4891 Unspecified atrial fibrillation: Secondary | ICD-10-CM

## 2012-03-18 DIAGNOSIS — I7090 Unspecified atherosclerosis: Secondary | ICD-10-CM | POA: Diagnosis not present

## 2012-03-18 DIAGNOSIS — K922 Gastrointestinal hemorrhage, unspecified: Secondary | ICD-10-CM | POA: Diagnosis not present

## 2012-03-18 DIAGNOSIS — D689 Coagulation defect, unspecified: Secondary | ICD-10-CM

## 2012-03-18 DIAGNOSIS — I2510 Atherosclerotic heart disease of native coronary artery without angina pectoris: Secondary | ICD-10-CM | POA: Diagnosis not present

## 2012-03-18 LAB — POCT INR: INR: 2.4

## 2012-03-29 DIAGNOSIS — R972 Elevated prostate specific antigen [PSA]: Secondary | ICD-10-CM | POA: Diagnosis not present

## 2012-03-30 DIAGNOSIS — N183 Chronic kidney disease, stage 3 unspecified: Secondary | ICD-10-CM | POA: Diagnosis not present

## 2012-04-05 DIAGNOSIS — R972 Elevated prostate specific antigen [PSA]: Secondary | ICD-10-CM | POA: Diagnosis not present

## 2012-04-05 DIAGNOSIS — N401 Enlarged prostate with lower urinary tract symptoms: Secondary | ICD-10-CM | POA: Diagnosis not present

## 2012-04-05 DIAGNOSIS — N138 Other obstructive and reflux uropathy: Secondary | ICD-10-CM | POA: Diagnosis not present

## 2012-04-12 DIAGNOSIS — M19019 Primary osteoarthritis, unspecified shoulder: Secondary | ICD-10-CM | POA: Diagnosis not present

## 2012-04-12 DIAGNOSIS — M25519 Pain in unspecified shoulder: Secondary | ICD-10-CM | POA: Diagnosis not present

## 2012-04-13 ENCOUNTER — Ambulatory Visit (INDEPENDENT_AMBULATORY_CARE_PROVIDER_SITE_OTHER): Payer: Medicare Other | Admitting: *Deleted

## 2012-04-13 DIAGNOSIS — I7090 Unspecified atherosclerosis: Secondary | ICD-10-CM | POA: Diagnosis not present

## 2012-04-13 DIAGNOSIS — K922 Gastrointestinal hemorrhage, unspecified: Secondary | ICD-10-CM

## 2012-04-13 DIAGNOSIS — I4891 Unspecified atrial fibrillation: Secondary | ICD-10-CM

## 2012-04-13 DIAGNOSIS — I2510 Atherosclerotic heart disease of native coronary artery without angina pectoris: Secondary | ICD-10-CM | POA: Diagnosis not present

## 2012-04-13 DIAGNOSIS — D689 Coagulation defect, unspecified: Secondary | ICD-10-CM | POA: Diagnosis not present

## 2012-04-13 MED ORDER — WARFARIN SODIUM 2.5 MG PO TABS: 2.5000 mg | ORAL_TABLET | Freq: Every day | ORAL | Status: DC

## 2012-04-14 DIAGNOSIS — M25519 Pain in unspecified shoulder: Secondary | ICD-10-CM | POA: Diagnosis not present

## 2012-04-14 DIAGNOSIS — S4350XA Sprain of unspecified acromioclavicular joint, initial encounter: Secondary | ICD-10-CM | POA: Diagnosis not present

## 2012-04-18 DIAGNOSIS — S4350XA Sprain of unspecified acromioclavicular joint, initial encounter: Secondary | ICD-10-CM | POA: Diagnosis not present

## 2012-04-18 DIAGNOSIS — M25519 Pain in unspecified shoulder: Secondary | ICD-10-CM | POA: Diagnosis not present

## 2012-04-19 DIAGNOSIS — M25519 Pain in unspecified shoulder: Secondary | ICD-10-CM | POA: Diagnosis not present

## 2012-04-19 DIAGNOSIS — S4350XA Sprain of unspecified acromioclavicular joint, initial encounter: Secondary | ICD-10-CM | POA: Diagnosis not present

## 2012-04-21 DIAGNOSIS — M25519 Pain in unspecified shoulder: Secondary | ICD-10-CM | POA: Diagnosis not present

## 2012-04-21 DIAGNOSIS — S4350XA Sprain of unspecified acromioclavicular joint, initial encounter: Secondary | ICD-10-CM | POA: Diagnosis not present

## 2012-04-22 DIAGNOSIS — D649 Anemia, unspecified: Secondary | ICD-10-CM | POA: Diagnosis not present

## 2012-04-22 DIAGNOSIS — N2581 Secondary hyperparathyroidism of renal origin: Secondary | ICD-10-CM | POA: Diagnosis not present

## 2012-04-22 DIAGNOSIS — N184 Chronic kidney disease, stage 4 (severe): Secondary | ICD-10-CM | POA: Diagnosis not present

## 2012-04-25 DIAGNOSIS — S4350XA Sprain of unspecified acromioclavicular joint, initial encounter: Secondary | ICD-10-CM | POA: Diagnosis not present

## 2012-04-25 DIAGNOSIS — M25519 Pain in unspecified shoulder: Secondary | ICD-10-CM | POA: Diagnosis not present

## 2012-04-26 DIAGNOSIS — S4350XA Sprain of unspecified acromioclavicular joint, initial encounter: Secondary | ICD-10-CM | POA: Diagnosis not present

## 2012-04-26 DIAGNOSIS — M25519 Pain in unspecified shoulder: Secondary | ICD-10-CM | POA: Diagnosis not present

## 2012-04-28 DIAGNOSIS — M25519 Pain in unspecified shoulder: Secondary | ICD-10-CM | POA: Diagnosis not present

## 2012-04-28 DIAGNOSIS — S4350XA Sprain of unspecified acromioclavicular joint, initial encounter: Secondary | ICD-10-CM | POA: Diagnosis not present

## 2012-05-02 DIAGNOSIS — S4350XA Sprain of unspecified acromioclavicular joint, initial encounter: Secondary | ICD-10-CM | POA: Diagnosis not present

## 2012-05-02 DIAGNOSIS — M25519 Pain in unspecified shoulder: Secondary | ICD-10-CM | POA: Diagnosis not present

## 2012-05-03 DIAGNOSIS — I2510 Atherosclerotic heart disease of native coronary artery without angina pectoris: Secondary | ICD-10-CM | POA: Diagnosis not present

## 2012-05-03 DIAGNOSIS — S4350XA Sprain of unspecified acromioclavicular joint, initial encounter: Secondary | ICD-10-CM | POA: Diagnosis not present

## 2012-05-03 DIAGNOSIS — D509 Iron deficiency anemia, unspecified: Secondary | ICD-10-CM | POA: Diagnosis not present

## 2012-05-03 DIAGNOSIS — M25519 Pain in unspecified shoulder: Secondary | ICD-10-CM | POA: Diagnosis not present

## 2012-05-03 DIAGNOSIS — N183 Chronic kidney disease, stage 3 unspecified: Secondary | ICD-10-CM | POA: Diagnosis not present

## 2012-05-05 DIAGNOSIS — M25519 Pain in unspecified shoulder: Secondary | ICD-10-CM | POA: Diagnosis not present

## 2012-05-05 DIAGNOSIS — S4350XA Sprain of unspecified acromioclavicular joint, initial encounter: Secondary | ICD-10-CM | POA: Diagnosis not present

## 2012-05-09 DIAGNOSIS — Z23 Encounter for immunization: Secondary | ICD-10-CM | POA: Diagnosis not present

## 2012-05-10 DIAGNOSIS — S4350XA Sprain of unspecified acromioclavicular joint, initial encounter: Secondary | ICD-10-CM | POA: Diagnosis not present

## 2012-05-10 DIAGNOSIS — M25519 Pain in unspecified shoulder: Secondary | ICD-10-CM | POA: Diagnosis not present

## 2012-05-12 DIAGNOSIS — M25519 Pain in unspecified shoulder: Secondary | ICD-10-CM | POA: Diagnosis not present

## 2012-05-12 DIAGNOSIS — S4350XA Sprain of unspecified acromioclavicular joint, initial encounter: Secondary | ICD-10-CM | POA: Diagnosis not present

## 2012-05-13 ENCOUNTER — Other Ambulatory Visit (HOSPITAL_COMMUNITY): Payer: Self-pay | Admitting: *Deleted

## 2012-05-16 ENCOUNTER — Encounter (HOSPITAL_COMMUNITY)
Admission: RE | Admit: 2012-05-16 | Discharge: 2012-05-16 | Disposition: A | Discharge status: Home / Self Care | Payer: Medicare Other | Source: Ambulatory Visit | Attending: Nephrology | Admitting: Nephrology

## 2012-05-16 DIAGNOSIS — D509 Iron deficiency anemia, unspecified: Secondary | ICD-10-CM | POA: Diagnosis not present

## 2012-05-16 MED ORDER — FERUMOXYTOL INJECTION 510 MG/17 ML
510.0000 mg | INTRAVENOUS | Status: DC
  Administered 2012-05-16: 510 mg via INTRAVENOUS

## 2012-05-16 MED ORDER — FERUMOXYTOL INJECTION 510 MG/17 ML
INTRAVENOUS | Status: AC
  Administered 2012-05-16: 510 mg via INTRAVENOUS
  Filled 2012-05-16: qty 17

## 2012-05-16 MED ORDER — SODIUM CHLORIDE 0.9 % IV SOLN
INTRAVENOUS | Status: DC
  Administered 2012-05-16: 12:00:00 via INTRAVENOUS

## 2012-05-18 ENCOUNTER — Ambulatory Visit (INDEPENDENT_AMBULATORY_CARE_PROVIDER_SITE_OTHER): Payer: Medicare Other | Admitting: *Deleted

## 2012-05-18 DIAGNOSIS — I7090 Unspecified atherosclerosis: Secondary | ICD-10-CM | POA: Diagnosis not present

## 2012-05-18 DIAGNOSIS — I4891 Unspecified atrial fibrillation: Secondary | ICD-10-CM | POA: Diagnosis not present

## 2012-05-18 DIAGNOSIS — K922 Gastrointestinal hemorrhage, unspecified: Secondary | ICD-10-CM | POA: Diagnosis not present

## 2012-05-18 DIAGNOSIS — I2510 Atherosclerotic heart disease of native coronary artery without angina pectoris: Secondary | ICD-10-CM

## 2012-05-18 DIAGNOSIS — D689 Coagulation defect, unspecified: Secondary | ICD-10-CM

## 2012-05-20 ENCOUNTER — Encounter (HOSPITAL_COMMUNITY)
Admission: RE | Admit: 2012-05-20 | Discharge: 2012-05-20 | Disposition: A | Discharge status: Home / Self Care | Payer: Medicare Other | Source: Ambulatory Visit | Attending: Nephrology | Admitting: Nephrology

## 2012-05-20 MED ORDER — FERUMOXYTOL INJECTION 510 MG/17 ML: 510.0000 mg | Freq: Once | INTRAVENOUS | Status: DC

## 2012-05-20 MED ORDER — FERUMOXYTOL INJECTION 510 MG/17 ML
INTRAVENOUS | Status: AC
  Administered 2012-05-20: 510 mg
  Filled 2012-05-20: qty 17

## 2012-05-20 MED ORDER — SODIUM CHLORIDE 0.9 % IV SOLN
INTRAVENOUS | Status: DC
  Administered 2012-05-20: 13:00:00 via INTRAVENOUS

## 2012-05-24 ENCOUNTER — Other Ambulatory Visit: Payer: Self-pay | Admitting: *Deleted

## 2012-05-24 ENCOUNTER — Other Ambulatory Visit (INDEPENDENT_AMBULATORY_CARE_PROVIDER_SITE_OTHER): Payer: Medicare Other

## 2012-05-24 ENCOUNTER — Ambulatory Visit (INDEPENDENT_AMBULATORY_CARE_PROVIDER_SITE_OTHER): Payer: Medicare Other | Admitting: Cardiovascular Disease

## 2012-05-24 ENCOUNTER — Encounter: Payer: Self-pay | Admitting: Cardiovascular Disease

## 2012-05-24 VITALS — BP 126/68 | HR 71 | Ht 63.0 in | Wt 165.0 lb

## 2012-05-24 DIAGNOSIS — I4891 Unspecified atrial fibrillation: Secondary | ICD-10-CM

## 2012-05-24 DIAGNOSIS — I5030 Unspecified diastolic (congestive) heart failure: Secondary | ICD-10-CM

## 2012-05-24 DIAGNOSIS — E785 Hyperlipidemia, unspecified: Secondary | ICD-10-CM

## 2012-05-24 DIAGNOSIS — K922 Gastrointestinal hemorrhage, unspecified: Secondary | ICD-10-CM

## 2012-05-24 DIAGNOSIS — I509 Heart failure, unspecified: Secondary | ICD-10-CM

## 2012-05-24 LAB — HEPATIC FUNCTION PANEL
ALT: 24 U/L (ref 0–53)
Albumin: 3.4 g/dL — ABNORMAL LOW (ref 3.5–5.2)
Total Protein: 6.8 g/dL (ref 6.0–8.3)

## 2012-05-24 LAB — CBC WITH DIFFERENTIAL/PLATELET
Basophils Relative: 0.8 % (ref 0.0–3.0)
Eosinophils Relative: 3 % (ref 0.0–5.0)
HCT: 31.8 % — ABNORMAL LOW (ref 39.0–52.0)
Lymphs Abs: 1.4 10*3/uL (ref 0.7–4.0)
MCHC: 32.6 g/dL (ref 30.0–36.0)
MCV: 91.2 fl (ref 78.0–100.0)
Monocytes Absolute: 0.5 10*3/uL (ref 0.1–1.0)
RBC: 3.48 Mil/uL — ABNORMAL LOW (ref 4.22–5.81)
WBC: 6.3 10*3/uL (ref 4.5–10.5)

## 2012-05-24 LAB — BASIC METABOLIC PANEL
BUN: 38 mg/dL — ABNORMAL HIGH (ref 6–23)
CO2: 30 mEq/L (ref 19–32)
Chloride: 99 mEq/L (ref 96–112)
Creatinine, Ser: 2.5 mg/dL — ABNORMAL HIGH (ref 0.4–1.5)
Glucose, Bld: 94 mg/dL (ref 70–99)

## 2012-05-24 LAB — LIPID PANEL
Cholesterol: 110 mg/dL (ref 0–200)
Triglycerides: 80 mg/dL (ref 0.0–149.0)

## 2012-05-24 NOTE — Progress Notes (Signed)
Pt will be seen today, came early for fasting labs.
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
## 2012-05-24 NOTE — Assessment & Plan Note (Signed)
[name] is doing well. He continues to have atrial fibrillation but appears to be asymptomatic by my exam. At this point I don't think that we need to try to cardiovert him again. He will continue with chronic anticoagulation. He has moderate anemia which is due to  chronic kidney disease.   He has a history of peptic ulcer disease in the past.  We'll continue with his same medications.

## 2012-05-24 NOTE — Patient Instructions (Addendum)
Your physician wants you to follow-up in: 6 MONTHS You will receive a reminder letter in the mail two months in advance. If you don't receive a letter, please call our office to schedule the follow-up appointment.  Your physician recommends that you return for a FASTING lipid profile: 6 MONTHS

## 2012-05-26 ENCOUNTER — Encounter: Payer: Self-pay | Admitting: Cardiovascular Disease

## 2012-05-27 ENCOUNTER — Ambulatory Visit (INDEPENDENT_AMBULATORY_CARE_PROVIDER_SITE_OTHER): Payer: Medicare Other | Admitting: *Deleted

## 2012-05-27 DIAGNOSIS — I4891 Unspecified atrial fibrillation: Secondary | ICD-10-CM | POA: Diagnosis not present

## 2012-05-27 DIAGNOSIS — D689 Coagulation defect, unspecified: Secondary | ICD-10-CM

## 2012-05-27 DIAGNOSIS — I7090 Unspecified atherosclerosis: Secondary | ICD-10-CM | POA: Diagnosis not present

## 2012-05-27 DIAGNOSIS — K922 Gastrointestinal hemorrhage, unspecified: Secondary | ICD-10-CM | POA: Diagnosis not present

## 2012-05-27 DIAGNOSIS — I2510 Atherosclerotic heart disease of native coronary artery without angina pectoris: Secondary | ICD-10-CM

## 2012-05-27 LAB — POCT INR: INR: 2.4

## 2012-06-01 ENCOUNTER — Encounter: Payer: Self-pay | Admitting: Cardiovascular Disease

## 2012-06-06 DIAGNOSIS — I10 Essential (primary) hypertension: Secondary | ICD-10-CM | POA: Diagnosis not present

## 2012-06-06 DIAGNOSIS — I509 Heart failure, unspecified: Secondary | ICD-10-CM | POA: Diagnosis not present

## 2012-06-06 DIAGNOSIS — I2510 Atherosclerotic heart disease of native coronary artery without angina pectoris: Secondary | ICD-10-CM | POA: Diagnosis not present

## 2012-06-06 DIAGNOSIS — Z23 Encounter for immunization: Secondary | ICD-10-CM | POA: Diagnosis not present

## 2012-06-13 ENCOUNTER — Ambulatory Visit (INDEPENDENT_AMBULATORY_CARE_PROVIDER_SITE_OTHER): Payer: Medicare Other | Admitting: Pharmacist

## 2012-06-13 DIAGNOSIS — I4891 Unspecified atrial fibrillation: Secondary | ICD-10-CM

## 2012-06-13 DIAGNOSIS — I7090 Unspecified atherosclerosis: Secondary | ICD-10-CM | POA: Diagnosis not present

## 2012-06-13 DIAGNOSIS — D689 Coagulation defect, unspecified: Secondary | ICD-10-CM | POA: Diagnosis not present

## 2012-06-13 DIAGNOSIS — I2510 Atherosclerotic heart disease of native coronary artery without angina pectoris: Secondary | ICD-10-CM

## 2012-06-13 DIAGNOSIS — N183 Chronic kidney disease, stage 3 unspecified: Secondary | ICD-10-CM | POA: Diagnosis not present

## 2012-06-13 DIAGNOSIS — K922 Gastrointestinal hemorrhage, unspecified: Secondary | ICD-10-CM | POA: Diagnosis not present

## 2012-06-13 DIAGNOSIS — D649 Anemia, unspecified: Secondary | ICD-10-CM | POA: Diagnosis not present

## 2012-06-22 ENCOUNTER — Other Ambulatory Visit: Payer: Self-pay | Admitting: *Deleted

## 2012-06-22 MED ORDER — WARFARIN SODIUM 2.5 MG PO TABS: ORAL_TABLET | ORAL | Status: DC

## 2012-06-24 ENCOUNTER — Other Ambulatory Visit: Payer: Self-pay

## 2012-06-24 MED ORDER — WARFARIN SODIUM 2.5 MG PO TABS: ORAL_TABLET | ORAL | Status: DC

## 2012-07-04 ENCOUNTER — Ambulatory Visit (INDEPENDENT_AMBULATORY_CARE_PROVIDER_SITE_OTHER): Payer: Medicare Other | Admitting: *Deleted

## 2012-07-04 DIAGNOSIS — I4891 Unspecified atrial fibrillation: Secondary | ICD-10-CM

## 2012-07-04 DIAGNOSIS — I2510 Atherosclerotic heart disease of native coronary artery without angina pectoris: Secondary | ICD-10-CM | POA: Diagnosis not present

## 2012-07-04 DIAGNOSIS — K922 Gastrointestinal hemorrhage, unspecified: Secondary | ICD-10-CM

## 2012-07-04 DIAGNOSIS — D689 Coagulation defect, unspecified: Secondary | ICD-10-CM | POA: Diagnosis not present

## 2012-07-04 DIAGNOSIS — I7090 Unspecified atherosclerosis: Secondary | ICD-10-CM

## 2012-07-20 DIAGNOSIS — N183 Chronic kidney disease, stage 3 unspecified: Secondary | ICD-10-CM | POA: Diagnosis not present

## 2012-07-20 DIAGNOSIS — D649 Anemia, unspecified: Secondary | ICD-10-CM | POA: Diagnosis not present

## 2012-07-28 DIAGNOSIS — N183 Chronic kidney disease, stage 3 unspecified: Secondary | ICD-10-CM | POA: Diagnosis not present

## 2012-07-28 DIAGNOSIS — D509 Iron deficiency anemia, unspecified: Secondary | ICD-10-CM | POA: Diagnosis not present

## 2012-07-28 DIAGNOSIS — D649 Anemia, unspecified: Secondary | ICD-10-CM | POA: Diagnosis not present

## 2012-08-01 ENCOUNTER — Ambulatory Visit (INDEPENDENT_AMBULATORY_CARE_PROVIDER_SITE_OTHER): Payer: Medicare Other

## 2012-08-01 DIAGNOSIS — I2510 Atherosclerotic heart disease of native coronary artery without angina pectoris: Secondary | ICD-10-CM | POA: Diagnosis not present

## 2012-08-01 DIAGNOSIS — D689 Coagulation defect, unspecified: Secondary | ICD-10-CM | POA: Diagnosis not present

## 2012-08-01 DIAGNOSIS — I4891 Unspecified atrial fibrillation: Secondary | ICD-10-CM | POA: Diagnosis not present

## 2012-08-01 DIAGNOSIS — K922 Gastrointestinal hemorrhage, unspecified: Secondary | ICD-10-CM

## 2012-08-01 DIAGNOSIS — I7090 Unspecified atherosclerosis: Secondary | ICD-10-CM

## 2012-08-01 LAB — POCT INR: INR: 2.4

## 2012-08-23 ENCOUNTER — Ambulatory Visit (INDEPENDENT_AMBULATORY_CARE_PROVIDER_SITE_OTHER): Payer: Medicare Other | Admitting: *Deleted

## 2012-08-23 DIAGNOSIS — K922 Gastrointestinal hemorrhage, unspecified: Secondary | ICD-10-CM

## 2012-08-23 DIAGNOSIS — I4891 Unspecified atrial fibrillation: Secondary | ICD-10-CM

## 2012-08-23 DIAGNOSIS — D689 Coagulation defect, unspecified: Secondary | ICD-10-CM | POA: Diagnosis not present

## 2012-08-23 DIAGNOSIS — I7090 Unspecified atherosclerosis: Secondary | ICD-10-CM

## 2012-08-23 DIAGNOSIS — I2510 Atherosclerotic heart disease of native coronary artery without angina pectoris: Secondary | ICD-10-CM

## 2012-08-23 LAB — POCT INR: INR: 3.5

## 2012-09-06 ENCOUNTER — Ambulatory Visit (INDEPENDENT_AMBULATORY_CARE_PROVIDER_SITE_OTHER): Payer: Medicare Other | Admitting: *Deleted

## 2012-09-06 DIAGNOSIS — K922 Gastrointestinal hemorrhage, unspecified: Secondary | ICD-10-CM | POA: Diagnosis not present

## 2012-09-06 DIAGNOSIS — I2510 Atherosclerotic heart disease of native coronary artery without angina pectoris: Secondary | ICD-10-CM | POA: Diagnosis not present

## 2012-09-06 DIAGNOSIS — D689 Coagulation defect, unspecified: Secondary | ICD-10-CM

## 2012-09-06 DIAGNOSIS — I4891 Unspecified atrial fibrillation: Secondary | ICD-10-CM | POA: Diagnosis not present

## 2012-09-06 DIAGNOSIS — I7090 Unspecified atherosclerosis: Secondary | ICD-10-CM | POA: Diagnosis not present

## 2012-09-08 DIAGNOSIS — Z125 Encounter for screening for malignant neoplasm of prostate: Secondary | ICD-10-CM | POA: Diagnosis not present

## 2012-09-08 DIAGNOSIS — E039 Hypothyroidism, unspecified: Secondary | ICD-10-CM | POA: Diagnosis not present

## 2012-09-08 DIAGNOSIS — I10 Essential (primary) hypertension: Secondary | ICD-10-CM | POA: Diagnosis not present

## 2012-09-08 DIAGNOSIS — D62 Acute posthemorrhagic anemia: Secondary | ICD-10-CM | POA: Diagnosis not present

## 2012-09-08 DIAGNOSIS — Z0000 Encounter for general adult medical examination without abnormal findings: Secondary | ICD-10-CM | POA: Diagnosis not present

## 2012-09-08 DIAGNOSIS — E785 Hyperlipidemia, unspecified: Secondary | ICD-10-CM | POA: Diagnosis not present

## 2012-09-08 DIAGNOSIS — E559 Vitamin D deficiency, unspecified: Secondary | ICD-10-CM | POA: Diagnosis not present

## 2012-09-08 DIAGNOSIS — I509 Heart failure, unspecified: Secondary | ICD-10-CM | POA: Diagnosis not present

## 2012-09-08 DIAGNOSIS — Z1331 Encounter for screening for depression: Secondary | ICD-10-CM | POA: Diagnosis not present

## 2012-09-13 ENCOUNTER — Ambulatory Visit (INDEPENDENT_AMBULATORY_CARE_PROVIDER_SITE_OTHER): Payer: Medicare Other | Admitting: *Deleted

## 2012-09-13 DIAGNOSIS — I4891 Unspecified atrial fibrillation: Secondary | ICD-10-CM | POA: Diagnosis not present

## 2012-09-13 DIAGNOSIS — I7090 Unspecified atherosclerosis: Secondary | ICD-10-CM | POA: Diagnosis not present

## 2012-09-13 DIAGNOSIS — D689 Coagulation defect, unspecified: Secondary | ICD-10-CM

## 2012-09-13 DIAGNOSIS — K922 Gastrointestinal hemorrhage, unspecified: Secondary | ICD-10-CM | POA: Diagnosis not present

## 2012-09-13 DIAGNOSIS — I2510 Atherosclerotic heart disease of native coronary artery without angina pectoris: Secondary | ICD-10-CM

## 2012-09-13 LAB — POCT INR: INR: 2.2

## 2012-09-16 DIAGNOSIS — D649 Anemia, unspecified: Secondary | ICD-10-CM | POA: Diagnosis not present

## 2012-09-16 DIAGNOSIS — I10 Essential (primary) hypertension: Secondary | ICD-10-CM | POA: Diagnosis not present

## 2012-09-16 DIAGNOSIS — H6120 Impacted cerumen, unspecified ear: Secondary | ICD-10-CM | POA: Diagnosis not present

## 2012-09-16 DIAGNOSIS — E785 Hyperlipidemia, unspecified: Secondary | ICD-10-CM | POA: Diagnosis not present

## 2012-09-16 DIAGNOSIS — I509 Heart failure, unspecified: Secondary | ICD-10-CM | POA: Diagnosis not present

## 2012-09-26 ENCOUNTER — Ambulatory Visit: Payer: Self-pay | Admitting: Cardiology

## 2012-09-26 DIAGNOSIS — Z7901 Long term (current) use of anticoagulants: Secondary | ICD-10-CM | POA: Diagnosis not present

## 2012-09-26 DIAGNOSIS — I4891 Unspecified atrial fibrillation: Secondary | ICD-10-CM

## 2012-09-26 DIAGNOSIS — K922 Gastrointestinal hemorrhage, unspecified: Secondary | ICD-10-CM

## 2012-09-26 LAB — PROTIME-INR: INR: 2 — AB (ref <=1.1)

## 2012-10-10 ENCOUNTER — Ambulatory Visit: Payer: Self-pay | Admitting: Cardiovascular Disease

## 2012-10-10 DIAGNOSIS — Z7901 Long term (current) use of anticoagulants: Secondary | ICD-10-CM | POA: Diagnosis not present

## 2012-10-10 DIAGNOSIS — I4891 Unspecified atrial fibrillation: Secondary | ICD-10-CM

## 2012-10-10 DIAGNOSIS — K922 Gastrointestinal hemorrhage, unspecified: Secondary | ICD-10-CM

## 2012-10-10 LAB — PROTIME-INR: INR: 2.1 — AB (ref 0.9–1.1)

## 2012-10-24 ENCOUNTER — Ambulatory Visit: Payer: Self-pay | Admitting: Cardiology

## 2012-10-24 DIAGNOSIS — I4891 Unspecified atrial fibrillation: Secondary | ICD-10-CM

## 2012-10-24 DIAGNOSIS — K922 Gastrointestinal hemorrhage, unspecified: Secondary | ICD-10-CM

## 2012-10-24 DIAGNOSIS — Z7901 Long term (current) use of anticoagulants: Secondary | ICD-10-CM | POA: Diagnosis not present

## 2012-11-07 ENCOUNTER — Ambulatory Visit (INDEPENDENT_AMBULATORY_CARE_PROVIDER_SITE_OTHER): Payer: Medicare Other | Admitting: Cardiology

## 2012-11-07 DIAGNOSIS — I4891 Unspecified atrial fibrillation: Secondary | ICD-10-CM

## 2012-11-07 DIAGNOSIS — K922 Gastrointestinal hemorrhage, unspecified: Secondary | ICD-10-CM

## 2012-11-07 DIAGNOSIS — Z7901 Long term (current) use of anticoagulants: Secondary | ICD-10-CM | POA: Diagnosis not present

## 2012-11-22 ENCOUNTER — Encounter: Payer: Self-pay | Admitting: Cardiovascular Disease

## 2012-11-22 ENCOUNTER — Ambulatory Visit (INDEPENDENT_AMBULATORY_CARE_PROVIDER_SITE_OTHER): Payer: Medicare Other | Admitting: Cardiovascular Disease

## 2012-11-22 VITALS — BP 128/72 | HR 87 | Ht 63.0 in | Wt 166.8 lb

## 2012-11-22 DIAGNOSIS — I4891 Unspecified atrial fibrillation: Secondary | ICD-10-CM

## 2012-11-22 DIAGNOSIS — R609 Edema, unspecified: Secondary | ICD-10-CM | POA: Insufficient documentation

## 2012-11-22 DIAGNOSIS — I5030 Unspecified diastolic (congestive) heart failure: Secondary | ICD-10-CM

## 2012-11-22 DIAGNOSIS — D649 Anemia, unspecified: Secondary | ICD-10-CM | POA: Diagnosis not present

## 2012-11-22 DIAGNOSIS — N183 Chronic kidney disease, stage 3 unspecified: Secondary | ICD-10-CM | POA: Diagnosis not present

## 2012-11-22 DIAGNOSIS — I509 Heart failure, unspecified: Secondary | ICD-10-CM | POA: Diagnosis not present

## 2012-11-22 DIAGNOSIS — N2581 Secondary hyperparathyroidism of renal origin: Secondary | ICD-10-CM | POA: Diagnosis not present

## 2012-11-22 NOTE — Assessment & Plan Note (Signed)
He has chronic leg edema. This is probably a combination of his age, diastolic dysfunction in the setting of atrial fibrillation, and renal insufficiency. Edema gets better at night.  I have advised him to elevate his legs several times through the day. I hesitate to increase his Lasix since that may cause some renal impairment. Dr. Hassell Done may want to increase his Lasix but I will leave that up to him.  I suggested that he get some compression hose and to try ambulating more

## 2012-11-22 NOTE — Assessment & Plan Note (Signed)
He is doing well.  His is basically asmptomatic.  His rate is well controlled.

## 2012-11-22 NOTE — Patient Instructions (Addendum)
Your physician wants you to follow-up in: 6 MONTHS You will receive a reminder letter in the mail two months in advance. If you don't receive a letter, please call our office to schedule the follow-up appointment.  Your physician recommends that you continue on your current medications as directed. Please refer to the Current Medication list given to you today.  ELEVATE LEGS AND CONSIDER GETTING COMPRESSION HOSE
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
## 2012-11-28 ENCOUNTER — Ambulatory Visit (INDEPENDENT_AMBULATORY_CARE_PROVIDER_SITE_OTHER): Payer: Medicare Other | Admitting: Cardiology

## 2012-11-28 DIAGNOSIS — Z7901 Long term (current) use of anticoagulants: Secondary | ICD-10-CM | POA: Diagnosis not present

## 2012-11-28 DIAGNOSIS — I4891 Unspecified atrial fibrillation: Secondary | ICD-10-CM

## 2012-11-28 DIAGNOSIS — K922 Gastrointestinal hemorrhage, unspecified: Secondary | ICD-10-CM

## 2012-11-28 LAB — PROTIME-INR: INR: 2 — AB (ref 0.9–1.1)

## 2012-11-30 DIAGNOSIS — R609 Edema, unspecified: Secondary | ICD-10-CM | POA: Diagnosis not present

## 2012-11-30 DIAGNOSIS — I509 Heart failure, unspecified: Secondary | ICD-10-CM | POA: Diagnosis not present

## 2012-11-30 DIAGNOSIS — I4891 Unspecified atrial fibrillation: Secondary | ICD-10-CM | POA: Diagnosis not present

## 2012-11-30 DIAGNOSIS — N183 Chronic kidney disease, stage 3 unspecified: Secondary | ICD-10-CM | POA: Diagnosis not present

## 2012-12-19 ENCOUNTER — Ambulatory Visit (INDEPENDENT_AMBULATORY_CARE_PROVIDER_SITE_OTHER): Payer: Medicare Other | Admitting: Cardiovascular Disease

## 2012-12-19 DIAGNOSIS — K922 Gastrointestinal hemorrhage, unspecified: Secondary | ICD-10-CM

## 2012-12-19 DIAGNOSIS — I4891 Unspecified atrial fibrillation: Secondary | ICD-10-CM

## 2012-12-19 DIAGNOSIS — Z7901 Long term (current) use of anticoagulants: Secondary | ICD-10-CM | POA: Diagnosis not present

## 2012-12-28 DIAGNOSIS — L570 Actinic keratosis: Secondary | ICD-10-CM | POA: Diagnosis not present

## 2012-12-28 DIAGNOSIS — B356 Tinea cruris: Secondary | ICD-10-CM | POA: Diagnosis not present

## 2012-12-28 DIAGNOSIS — D235 Other benign neoplasm of skin of trunk: Secondary | ICD-10-CM | POA: Diagnosis not present

## 2012-12-28 DIAGNOSIS — D485 Neoplasm of uncertain behavior of skin: Secondary | ICD-10-CM | POA: Diagnosis not present

## 2013-01-09 DIAGNOSIS — M7750 Other enthesopathy of unspecified foot: Secondary | ICD-10-CM | POA: Diagnosis not present

## 2013-01-09 DIAGNOSIS — R609 Edema, unspecified: Secondary | ICD-10-CM | POA: Diagnosis not present

## 2013-01-11 DIAGNOSIS — H53009 Unspecified amblyopia, unspecified eye: Secondary | ICD-10-CM | POA: Diagnosis not present

## 2013-01-11 DIAGNOSIS — H04129 Dry eye syndrome of unspecified lacrimal gland: Secondary | ICD-10-CM | POA: Diagnosis not present

## 2013-01-11 DIAGNOSIS — H16109 Unspecified superficial keratitis, unspecified eye: Secondary | ICD-10-CM | POA: Diagnosis not present

## 2013-01-11 DIAGNOSIS — H35379 Puckering of macula, unspecified eye: Secondary | ICD-10-CM | POA: Diagnosis not present

## 2013-01-16 ENCOUNTER — Ambulatory Visit (INDEPENDENT_AMBULATORY_CARE_PROVIDER_SITE_OTHER): Payer: Medicare Other | Admitting: Cardiovascular Disease

## 2013-01-16 DIAGNOSIS — I4891 Unspecified atrial fibrillation: Secondary | ICD-10-CM

## 2013-01-16 DIAGNOSIS — K922 Gastrointestinal hemorrhage, unspecified: Secondary | ICD-10-CM

## 2013-01-16 DIAGNOSIS — Z7901 Long term (current) use of anticoagulants: Secondary | ICD-10-CM | POA: Diagnosis not present

## 2013-01-16 DIAGNOSIS — M7750 Other enthesopathy of unspecified foot: Secondary | ICD-10-CM | POA: Diagnosis not present

## 2013-01-16 DIAGNOSIS — R609 Edema, unspecified: Secondary | ICD-10-CM | POA: Diagnosis not present

## 2013-01-16 LAB — PROTIME-INR: INR: 2.7 — AB (ref 0.9–1.1)

## 2013-02-20 ENCOUNTER — Ambulatory Visit (INDEPENDENT_AMBULATORY_CARE_PROVIDER_SITE_OTHER): Payer: Medicare Other | Admitting: Cardiovascular Disease

## 2013-02-20 DIAGNOSIS — K922 Gastrointestinal hemorrhage, unspecified: Secondary | ICD-10-CM

## 2013-02-20 DIAGNOSIS — I4891 Unspecified atrial fibrillation: Secondary | ICD-10-CM | POA: Diagnosis not present

## 2013-02-20 DIAGNOSIS — Z7901 Long term (current) use of anticoagulants: Secondary | ICD-10-CM | POA: Diagnosis not present

## 2013-02-20 LAB — PROTIME-INR: INR: 2.6 — AB (ref 0.9–1.1)

## 2013-03-13 ENCOUNTER — Ambulatory Visit (INDEPENDENT_AMBULATORY_CARE_PROVIDER_SITE_OTHER): Payer: Medicare Other | Admitting: Cardiology

## 2013-03-13 DIAGNOSIS — K922 Gastrointestinal hemorrhage, unspecified: Secondary | ICD-10-CM

## 2013-03-13 DIAGNOSIS — Z7901 Long term (current) use of anticoagulants: Secondary | ICD-10-CM | POA: Diagnosis not present

## 2013-03-13 DIAGNOSIS — I4891 Unspecified atrial fibrillation: Secondary | ICD-10-CM | POA: Diagnosis not present

## 2013-03-13 LAB — PROTIME-INR: INR: 2.8 — AB (ref 0.9–1.1)

## 2013-03-16 DIAGNOSIS — E559 Vitamin D deficiency, unspecified: Secondary | ICD-10-CM | POA: Diagnosis not present

## 2013-03-16 DIAGNOSIS — E039 Hypothyroidism, unspecified: Secondary | ICD-10-CM | POA: Diagnosis not present

## 2013-03-16 DIAGNOSIS — D649 Anemia, unspecified: Secondary | ICD-10-CM | POA: Diagnosis not present

## 2013-03-16 DIAGNOSIS — I10 Essential (primary) hypertension: Secondary | ICD-10-CM | POA: Diagnosis not present

## 2013-03-23 DIAGNOSIS — E785 Hyperlipidemia, unspecified: Secondary | ICD-10-CM | POA: Diagnosis not present

## 2013-03-23 DIAGNOSIS — I10 Essential (primary) hypertension: Secondary | ICD-10-CM | POA: Diagnosis not present

## 2013-03-23 DIAGNOSIS — I509 Heart failure, unspecified: Secondary | ICD-10-CM | POA: Diagnosis not present

## 2013-03-23 DIAGNOSIS — I2510 Atherosclerotic heart disease of native coronary artery without angina pectoris: Secondary | ICD-10-CM | POA: Diagnosis not present

## 2013-04-06 DIAGNOSIS — M722 Plantar fascial fibromatosis: Secondary | ICD-10-CM | POA: Diagnosis not present

## 2013-04-17 ENCOUNTER — Ambulatory Visit (INDEPENDENT_AMBULATORY_CARE_PROVIDER_SITE_OTHER): Payer: Medicare Other | Admitting: Cardiology

## 2013-04-17 DIAGNOSIS — I4891 Unspecified atrial fibrillation: Secondary | ICD-10-CM

## 2013-04-17 DIAGNOSIS — Z7901 Long term (current) use of anticoagulants: Secondary | ICD-10-CM | POA: Diagnosis not present

## 2013-04-17 DIAGNOSIS — K922 Gastrointestinal hemorrhage, unspecified: Secondary | ICD-10-CM

## 2013-05-01 ENCOUNTER — Ambulatory Visit (INDEPENDENT_AMBULATORY_CARE_PROVIDER_SITE_OTHER): Payer: Medicare Other | Admitting: Cardiovascular Disease

## 2013-05-01 DIAGNOSIS — I4891 Unspecified atrial fibrillation: Secondary | ICD-10-CM | POA: Diagnosis not present

## 2013-05-01 DIAGNOSIS — K922 Gastrointestinal hemorrhage, unspecified: Secondary | ICD-10-CM

## 2013-05-01 DIAGNOSIS — Z7901 Long term (current) use of anticoagulants: Secondary | ICD-10-CM | POA: Diagnosis not present

## 2013-05-03 DIAGNOSIS — M722 Plantar fascial fibromatosis: Secondary | ICD-10-CM | POA: Diagnosis not present

## 2013-05-13 DIAGNOSIS — Z23 Encounter for immunization: Secondary | ICD-10-CM | POA: Diagnosis not present

## 2013-05-15 ENCOUNTER — Ambulatory Visit (INDEPENDENT_AMBULATORY_CARE_PROVIDER_SITE_OTHER): Payer: Medicare Other | Admitting: Pharmacist

## 2013-05-15 DIAGNOSIS — I4891 Unspecified atrial fibrillation: Secondary | ICD-10-CM | POA: Diagnosis not present

## 2013-05-15 DIAGNOSIS — K922 Gastrointestinal hemorrhage, unspecified: Secondary | ICD-10-CM

## 2013-05-15 DIAGNOSIS — Z7901 Long term (current) use of anticoagulants: Secondary | ICD-10-CM | POA: Diagnosis not present

## 2013-05-15 LAB — PROTIME-INR: INR: 2.7 — AB (ref 0.9–1.1)

## 2013-06-05 ENCOUNTER — Ambulatory Visit (INDEPENDENT_AMBULATORY_CARE_PROVIDER_SITE_OTHER): Payer: Medicare Other | Admitting: Pharmacist

## 2013-06-05 DIAGNOSIS — I4891 Unspecified atrial fibrillation: Secondary | ICD-10-CM

## 2013-06-05 DIAGNOSIS — K922 Gastrointestinal hemorrhage, unspecified: Secondary | ICD-10-CM

## 2013-06-05 DIAGNOSIS — Z7901 Long term (current) use of anticoagulants: Secondary | ICD-10-CM | POA: Diagnosis not present

## 2013-06-06 DIAGNOSIS — N183 Chronic kidney disease, stage 3 unspecified: Secondary | ICD-10-CM | POA: Diagnosis not present

## 2013-06-08 DIAGNOSIS — K279 Peptic ulcer, site unspecified, unspecified as acute or chronic, without hemorrhage or perforation: Secondary | ICD-10-CM | POA: Diagnosis not present

## 2013-06-08 DIAGNOSIS — N184 Chronic kidney disease, stage 4 (severe): Secondary | ICD-10-CM | POA: Diagnosis not present

## 2013-06-08 DIAGNOSIS — I10 Essential (primary) hypertension: Secondary | ICD-10-CM | POA: Diagnosis not present

## 2013-06-08 DIAGNOSIS — I259 Chronic ischemic heart disease, unspecified: Secondary | ICD-10-CM | POA: Diagnosis not present

## 2013-07-03 ENCOUNTER — Ambulatory Visit (INDEPENDENT_AMBULATORY_CARE_PROVIDER_SITE_OTHER): Payer: Medicare Other | Admitting: Pharmacist

## 2013-07-03 DIAGNOSIS — Z7901 Long term (current) use of anticoagulants: Secondary | ICD-10-CM | POA: Diagnosis not present

## 2013-07-03 DIAGNOSIS — K922 Gastrointestinal hemorrhage, unspecified: Secondary | ICD-10-CM

## 2013-07-03 DIAGNOSIS — I4891 Unspecified atrial fibrillation: Secondary | ICD-10-CM | POA: Diagnosis not present

## 2013-07-31 ENCOUNTER — Ambulatory Visit (INDEPENDENT_AMBULATORY_CARE_PROVIDER_SITE_OTHER): Payer: Medicare Other | Admitting: Pharmacist

## 2013-07-31 ENCOUNTER — Telehealth: Payer: Self-pay | Admitting: Pharmacist

## 2013-07-31 DIAGNOSIS — Z7901 Long term (current) use of anticoagulants: Secondary | ICD-10-CM | POA: Diagnosis not present

## 2013-07-31 DIAGNOSIS — I4891 Unspecified atrial fibrillation: Secondary | ICD-10-CM

## 2013-07-31 DIAGNOSIS — K922 Gastrointestinal hemorrhage, unspecified: Secondary | ICD-10-CM

## 2013-07-31 NOTE — Telephone Encounter (Signed)
New Problem:  Pt states he is calling to speak with the coumadin for his pt/inr results and his wife's results from today (Nessel,[place] [id number]). Pt would like a call back before 5pm as he and his wife go to dinner at that time.  Marland Kitchen

## 2013-07-31 NOTE — Telephone Encounter (Signed)
Spoke with pt.  Gave INR results to him for both he and his wife.  See anticoag notes for details.

## 2013-08-28 ENCOUNTER — Ambulatory Visit (INDEPENDENT_AMBULATORY_CARE_PROVIDER_SITE_OTHER): Payer: Medicare Other | Admitting: Pharmacist

## 2013-08-28 DIAGNOSIS — I4891 Unspecified atrial fibrillation: Secondary | ICD-10-CM

## 2013-08-28 DIAGNOSIS — K922 Gastrointestinal hemorrhage, unspecified: Secondary | ICD-10-CM

## 2013-08-28 DIAGNOSIS — Z7901 Long term (current) use of anticoagulants: Secondary | ICD-10-CM | POA: Diagnosis not present

## 2013-08-28 LAB — PROTIME-INR: INR: 2.5 — AB (ref 0.9–1.1)

## 2013-09-05 DIAGNOSIS — R51 Headache: Secondary | ICD-10-CM | POA: Diagnosis not present

## 2013-09-05 DIAGNOSIS — J069 Acute upper respiratory infection, unspecified: Secondary | ICD-10-CM | POA: Diagnosis not present

## 2013-09-07 DIAGNOSIS — H6120 Impacted cerumen, unspecified ear: Secondary | ICD-10-CM | POA: Diagnosis not present

## 2013-09-07 DIAGNOSIS — M316 Other giant cell arteritis: Secondary | ICD-10-CM | POA: Diagnosis not present

## 2013-09-07 DIAGNOSIS — R040 Epistaxis: Secondary | ICD-10-CM | POA: Diagnosis not present

## 2013-09-11 ENCOUNTER — Telehealth: Payer: Self-pay

## 2013-09-11 NOTE — Telephone Encounter (Signed)
Pt is scheduled for bx on 09/15/13 to diagnose temporal arthritis, has been instructed by Dr Benjamine Mola to hold Coumadin 2 days prior to procedure.  Please advise if ok to hold Coumadin prior to procedure.  Thanks

## 2013-09-12 NOTE — Telephone Encounter (Signed)
Pt was informed/ will send to Dr Benjamine Mola

## 2013-09-12 NOTE — Telephone Encounter (Signed)
He may hold coumadin for up to 5 days prior to temporal biopsy if needed.

## 2013-09-15 ENCOUNTER — Other Ambulatory Visit (INDEPENDENT_AMBULATORY_CARE_PROVIDER_SITE_OTHER): Payer: Self-pay | Admitting: Otolaryngology

## 2013-09-15 DIAGNOSIS — M316 Other giant cell arteritis: Secondary | ICD-10-CM | POA: Diagnosis not present

## 2013-09-15 DIAGNOSIS — R51 Headache: Secondary | ICD-10-CM | POA: Diagnosis not present

## 2013-09-15 DIAGNOSIS — D210 Benign neoplasm of connective and other soft tissue of head, face and neck: Secondary | ICD-10-CM | POA: Diagnosis not present

## 2013-09-25 ENCOUNTER — Ambulatory Visit (INDEPENDENT_AMBULATORY_CARE_PROVIDER_SITE_OTHER): Payer: Medicare Other | Admitting: Interventional Cardiology

## 2013-09-25 DIAGNOSIS — D509 Iron deficiency anemia, unspecified: Secondary | ICD-10-CM | POA: Diagnosis not present

## 2013-09-25 DIAGNOSIS — E559 Vitamin D deficiency, unspecified: Secondary | ICD-10-CM | POA: Diagnosis not present

## 2013-09-25 DIAGNOSIS — I4891 Unspecified atrial fibrillation: Secondary | ICD-10-CM | POA: Diagnosis not present

## 2013-09-25 DIAGNOSIS — K922 Gastrointestinal hemorrhage, unspecified: Secondary | ICD-10-CM

## 2013-09-25 DIAGNOSIS — I2510 Atherosclerotic heart disease of native coronary artery without angina pectoris: Secondary | ICD-10-CM | POA: Diagnosis not present

## 2013-09-25 DIAGNOSIS — Z0000 Encounter for general adult medical examination without abnormal findings: Secondary | ICD-10-CM | POA: Diagnosis not present

## 2013-09-25 DIAGNOSIS — Z7901 Long term (current) use of anticoagulants: Secondary | ICD-10-CM | POA: Diagnosis not present

## 2013-09-25 DIAGNOSIS — I10 Essential (primary) hypertension: Secondary | ICD-10-CM | POA: Diagnosis not present

## 2013-09-25 DIAGNOSIS — Z1331 Encounter for screening for depression: Secondary | ICD-10-CM | POA: Diagnosis not present

## 2013-09-25 DIAGNOSIS — Z125 Encounter for screening for malignant neoplasm of prostate: Secondary | ICD-10-CM | POA: Diagnosis not present

## 2013-09-25 DIAGNOSIS — E039 Hypothyroidism, unspecified: Secondary | ICD-10-CM | POA: Diagnosis not present

## 2013-09-25 DIAGNOSIS — E663 Overweight: Secondary | ICD-10-CM | POA: Diagnosis not present

## 2013-09-25 LAB — PROTIME-INR: INR: 2.4 — AB (ref 0.9–1.1)

## 2013-10-02 DIAGNOSIS — I10 Essential (primary) hypertension: Secondary | ICD-10-CM | POA: Diagnosis not present

## 2013-10-02 DIAGNOSIS — J8410 Pulmonary fibrosis, unspecified: Secondary | ICD-10-CM | POA: Diagnosis not present

## 2013-10-02 DIAGNOSIS — R0989 Other specified symptoms and signs involving the circulatory and respiratory systems: Secondary | ICD-10-CM | POA: Diagnosis not present

## 2013-10-02 DIAGNOSIS — I2510 Atherosclerotic heart disease of native coronary artery without angina pectoris: Secondary | ICD-10-CM | POA: Diagnosis not present

## 2013-10-02 DIAGNOSIS — E039 Hypothyroidism, unspecified: Secondary | ICD-10-CM | POA: Diagnosis not present

## 2013-10-02 DIAGNOSIS — E785 Hyperlipidemia, unspecified: Secondary | ICD-10-CM | POA: Diagnosis not present

## 2013-10-23 ENCOUNTER — Ambulatory Visit (INDEPENDENT_AMBULATORY_CARE_PROVIDER_SITE_OTHER): Payer: Medicare Other | Admitting: Pharmacist

## 2013-10-23 DIAGNOSIS — I4891 Unspecified atrial fibrillation: Secondary | ICD-10-CM | POA: Diagnosis not present

## 2013-10-23 DIAGNOSIS — K922 Gastrointestinal hemorrhage, unspecified: Secondary | ICD-10-CM

## 2013-10-23 DIAGNOSIS — Z7901 Long term (current) use of anticoagulants: Secondary | ICD-10-CM | POA: Diagnosis not present

## 2013-10-23 LAB — PROTIME-INR: INR: 2.7 — AB (ref 0.9–1.1)

## 2013-11-06 ENCOUNTER — Telehealth: Payer: Self-pay | Admitting: Pharmacist

## 2013-11-06 DIAGNOSIS — N184 Chronic kidney disease, stage 4 (severe): Secondary | ICD-10-CM | POA: Diagnosis not present

## 2013-11-06 DIAGNOSIS — I4891 Unspecified atrial fibrillation: Secondary | ICD-10-CM

## 2013-11-06 DIAGNOSIS — I10 Essential (primary) hypertension: Secondary | ICD-10-CM | POA: Diagnosis not present

## 2013-11-06 DIAGNOSIS — D649 Anemia, unspecified: Secondary | ICD-10-CM | POA: Diagnosis not present

## 2013-11-06 MED ORDER — WARFARIN SODIUM 2.5 MG PO TABS: ORAL_TABLET | ORAL | Status: DC

## 2013-11-06 NOTE — Telephone Encounter (Signed)
Patient called in for coumadin refill, we are routinely following INR

## 2013-11-20 ENCOUNTER — Ambulatory Visit (INDEPENDENT_AMBULATORY_CARE_PROVIDER_SITE_OTHER): Payer: Medicare Other | Admitting: Cardiology

## 2013-11-20 DIAGNOSIS — Z7901 Long term (current) use of anticoagulants: Secondary | ICD-10-CM | POA: Diagnosis not present

## 2013-11-20 DIAGNOSIS — I4891 Unspecified atrial fibrillation: Secondary | ICD-10-CM

## 2013-11-20 DIAGNOSIS — K922 Gastrointestinal hemorrhage, unspecified: Secondary | ICD-10-CM

## 2013-11-20 LAB — PROTIME-INR: INR: 3.1 — AB (ref 0.9–1.1)

## 2013-12-11 ENCOUNTER — Ambulatory Visit (INDEPENDENT_AMBULATORY_CARE_PROVIDER_SITE_OTHER): Payer: Medicare Other | Admitting: Cardiovascular Disease

## 2013-12-11 DIAGNOSIS — J8410 Pulmonary fibrosis, unspecified: Secondary | ICD-10-CM | POA: Diagnosis not present

## 2013-12-11 DIAGNOSIS — Z7901 Long term (current) use of anticoagulants: Secondary | ICD-10-CM | POA: Diagnosis not present

## 2013-12-11 DIAGNOSIS — I4891 Unspecified atrial fibrillation: Secondary | ICD-10-CM

## 2013-12-11 DIAGNOSIS — R509 Fever, unspecified: Secondary | ICD-10-CM | POA: Diagnosis not present

## 2013-12-11 DIAGNOSIS — K922 Gastrointestinal hemorrhage, unspecified: Secondary | ICD-10-CM

## 2013-12-11 LAB — PROTIME-INR: INR: 3.1 — AB (ref 0.9–1.1)

## 2013-12-14 ENCOUNTER — Ambulatory Visit (INDEPENDENT_AMBULATORY_CARE_PROVIDER_SITE_OTHER): Payer: Medicare Other | Admitting: Cardiovascular Disease

## 2013-12-14 DIAGNOSIS — I4891 Unspecified atrial fibrillation: Secondary | ICD-10-CM | POA: Diagnosis not present

## 2013-12-14 DIAGNOSIS — Z7901 Long term (current) use of anticoagulants: Secondary | ICD-10-CM | POA: Diagnosis not present

## 2013-12-14 DIAGNOSIS — K922 Gastrointestinal hemorrhage, unspecified: Secondary | ICD-10-CM

## 2013-12-14 LAB — PROTIME-INR: INR: 3.6 — AB (ref 0.9–1.1)

## 2013-12-15 ENCOUNTER — Telehealth: Payer: Self-pay | Admitting: Cardiovascular Disease

## 2013-12-15 DIAGNOSIS — R509 Fever, unspecified: Secondary | ICD-10-CM | POA: Diagnosis not present

## 2013-12-15 NOTE — Telephone Encounter (Signed)
Telephoned pt and he states he was given Levaquin 750mg s 3 pills one for QOD, today, Sunday and Tuesday. He is to also continue Doxycycline as well. He was instructed to take normal dose on Fri, Sat and skip Sunday's dosage. Have INR rechecked on 12/18/13, order sent to [facility].

## 2013-12-15 NOTE — Telephone Encounter (Signed)
Patient has been sick and was given a new antibiotic today. Wanted to make doctor aware. Medication is Levofloxacin 750 mg. He wants to know if this will affect his coumadin? Please call and advise.

## 2013-12-18 ENCOUNTER — Ambulatory Visit (INDEPENDENT_AMBULATORY_CARE_PROVIDER_SITE_OTHER): Payer: Medicare Other

## 2013-12-18 DIAGNOSIS — I4891 Unspecified atrial fibrillation: Secondary | ICD-10-CM | POA: Diagnosis not present

## 2013-12-18 DIAGNOSIS — K922 Gastrointestinal hemorrhage, unspecified: Secondary | ICD-10-CM | POA: Diagnosis not present

## 2013-12-18 DIAGNOSIS — R509 Fever, unspecified: Secondary | ICD-10-CM | POA: Diagnosis not present

## 2013-12-18 LAB — POCT INR: INR: 4.4

## 2013-12-21 DIAGNOSIS — R509 Fever, unspecified: Secondary | ICD-10-CM | POA: Diagnosis not present

## 2013-12-22 ENCOUNTER — Telehealth: Payer: Self-pay | Admitting: Cardiovascular Disease

## 2013-12-22 NOTE — Telephone Encounter (Signed)
Spoke with our Pharmacist Dr Alferd Apa and then called  Pt and instructed that Pratt usually has little effect on coumadin and that he does have an app to have INR drawn  on Monday and that they will recheck INR then.Pt states that [facility] to set up appt for INR to be done on Monday  the 18th  Also, instructed pt to eat dark leafy greens each day that he takes ZPak and he states understanding. Pt states has an appt to be see by infectious disease MD Tuesday . Also called and talked with nurse at [facility] and she states has order for INR check on Monday 18th

## 2013-12-22 NOTE — Telephone Encounter (Signed)
New message     PCP put patient on azithromycin 250mg  and he is on warfarin.  Please advise on dosage change.

## 2013-12-25 ENCOUNTER — Ambulatory Visit (INDEPENDENT_AMBULATORY_CARE_PROVIDER_SITE_OTHER): Payer: Medicare Other | Admitting: Cardiology

## 2013-12-25 DIAGNOSIS — I4891 Unspecified atrial fibrillation: Secondary | ICD-10-CM | POA: Diagnosis not present

## 2013-12-25 DIAGNOSIS — R0602 Shortness of breath: Secondary | ICD-10-CM | POA: Diagnosis not present

## 2013-12-25 DIAGNOSIS — J8410 Pulmonary fibrosis, unspecified: Secondary | ICD-10-CM | POA: Diagnosis not present

## 2013-12-25 DIAGNOSIS — Z7901 Long term (current) use of anticoagulants: Secondary | ICD-10-CM | POA: Diagnosis not present

## 2013-12-25 DIAGNOSIS — R509 Fever, unspecified: Secondary | ICD-10-CM | POA: Diagnosis not present

## 2013-12-25 DIAGNOSIS — D649 Anemia, unspecified: Secondary | ICD-10-CM | POA: Diagnosis not present

## 2013-12-25 DIAGNOSIS — Z951 Presence of aortocoronary bypass graft: Secondary | ICD-10-CM | POA: Diagnosis not present

## 2013-12-25 DIAGNOSIS — K922 Gastrointestinal hemorrhage, unspecified: Secondary | ICD-10-CM

## 2013-12-25 LAB — POCT INR: INR: 1.97

## 2013-12-26 ENCOUNTER — Encounter: Payer: Self-pay | Admitting: Infectious Disease

## 2013-12-26 ENCOUNTER — Telehealth: Payer: Self-pay | Admitting: Cardiovascular Disease

## 2013-12-26 ENCOUNTER — Ambulatory Visit (INDEPENDENT_AMBULATORY_CARE_PROVIDER_SITE_OTHER): Payer: Medicare Other | Admitting: Infectious Disease

## 2013-12-26 VITALS — BP 139/78 | HR 86 | Temp 98.0°F | Ht 63.5 in | Wt 155.0 lb

## 2013-12-26 DIAGNOSIS — R509 Fever, unspecified: Secondary | ICD-10-CM

## 2013-12-26 DIAGNOSIS — L570 Actinic keratosis: Secondary | ICD-10-CM | POA: Diagnosis not present

## 2013-12-26 DIAGNOSIS — L821 Other seborrheic keratosis: Secondary | ICD-10-CM | POA: Diagnosis not present

## 2013-12-26 DIAGNOSIS — N184 Chronic kidney disease, stage 4 (severe): Secondary | ICD-10-CM

## 2013-12-26 DIAGNOSIS — L820 Inflamed seborrheic keratosis: Secondary | ICD-10-CM | POA: Diagnosis not present

## 2013-12-26 DIAGNOSIS — K0889 Other specified disorders of teeth and supporting structures: Secondary | ICD-10-CM | POA: Diagnosis not present

## 2013-12-26 DIAGNOSIS — R1011 Right upper quadrant pain: Secondary | ICD-10-CM | POA: Diagnosis not present

## 2013-12-26 MED ORDER — CLINDAMYCIN HCL 300 MG PO CAPS: 300.0000 mg | ORAL_CAPSULE | Freq: Four times a day (QID) | ORAL | Status: DC

## 2013-12-26 NOTE — Telephone Encounter (Signed)
New message    Pt was scheduled to have coumadin drawn on 5-26---they can draw it on 21st or the 28th--which day will be ok?

## 2013-12-26 NOTE — Telephone Encounter (Signed)
LMOM ok to check pt's INR on 01/04/14 instead of 01/02/14.

## 2013-12-26 NOTE — Progress Notes (Signed)
Subjective:    Patient ID: [name], male    DOB: [birth year], 80 y.o.   MRN: [id number]  HPI  79 year old man with past medical history significant for coronary disease, congestive heart failure hypothyroidism atrial fibrillation on chronic Coumadin, straight total knee arthroplasty in the past history of left atrial tumor removal who has had fevers for approximately 3-4 weeks. Highest temperatures in the 102.6. He had chest x-ray done earlier this month which showed asymmetric interstitial infiltrates. He has been given several courses of antibiotics including doxycycline under milligrams twice daily for 7 days followed by levofloxacin 750 mg once a day for 5 days followed by azithromycin 500 mg x1 followed by 250 milligrams every day to finish 5 days of therapy. Antibiotics have not made a big improvement in his symptoms. Although he is eating better now he lost some weight initially with his fevers and now has had return of his appetite. His predominant symptom besides his fevers has been productive cough with some blood-tinged phlegm. This had recently improved.  Lab and now dilation done at his primary care doctor's office had shown a normal urinalysis with some trace blood he had a normal white blood cell and normal hemoglobin hematocrit and platelets on CBC. His sedimentation rate was slightly elevated at 38. Compress metabolic panel showed normal sodium and potassium normal chloride slightly elevated bicarbonate serum creatinine of 2.2 normal transaminases normal bilirubin normal alkaline phosphatase. PSA was slightly elevated at 5.88. Chest x-ray as mentioned had shown some asymmetric interstitial lung findings.  On closer questioning of the patient it turns out that he has a loose molar that has been present for several weeks. I wonder if he may in fact have an infection driving the loosening of his molar and encouraged him to be seen by his dentist. We will initiate a fever of unknown  workup here and also get a CT maxillofacial  Grew up in [place], [place]. 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 Married moved to [facility]. Travelled [place], [place], [place], [place], [place], docked in [place]. In [place] travelled [place], [place], [place], [place],   Review of Systems  Constitutional: Positive for fever, chills, activity change, appetite change, fatigue and unexpected weight change. Negative for diaphoresis.  HENT: Positive for dental problem. Negative for congestion, rhinorrhea, sinus pressure, sneezing, sore throat and trouble swallowing.   Eyes: Negative for photophobia and visual disturbance.  Respiratory: Positive for cough. Negative for chest tightness, shortness of breath, wheezing and stridor.   Cardiovascular: Negative for chest pain, palpitations and leg swelling.  Gastrointestinal: Negative for nausea, vomiting, abdominal pain, diarrhea, constipation, blood in stool and abdominal distention.  Genitourinary: Positive for decreased urine volume. Negative for dysuria, hematuria, flank pain and difficulty urinating.  Musculoskeletal: Negative for arthralgias, back pain, gait problem, joint swelling and myalgias.  Skin: Negative for color change, pallor, rash and wound.  Neurological: Negative for dizziness, tremors, weakness and light-headedness.  Hematological: Negative for adenopathy. Does not bruise/bleed easily.  Psychiatric/Behavioral: Negative for behavioral problems, confusion, sleep disturbance, dysphoric mood, decreased concentration and agitation.       Objective:   Physical Exam  Constitutional: He is oriented to person, place, and time. He appears well-developed and well-nourished. No distress.  HENT:  Head: Normocephalic and atraumatic.  Mouth/Throat: Oropharynx is clear and moist.    Eyes: Conjunctivae and EOM are normal. Pupils are equal, round, and reactive to light. No scleral icterus.  Neck:  Normal range of  motion. Neck supple. No JVD present.  Cardiovascular: Normal rate and normal heart sounds.  An irregularly irregular rhythm present. Exam reveals no gallop and no friction rub.   No murmur heard. Pulmonary/Chest: Effort normal. No respiratory distress. He has decreased breath sounds in the left lower field. He has no wheezes. He has rales. He exhibits no tenderness.  Abdominal: He exhibits no distension and no mass. There is no tenderness. There is no rebound and no guarding.  Musculoskeletal: He exhibits no edema and no tenderness.  Lymphadenopathy:    He has no cervical adenopathy.  Neurological: He is alert and oriented to person, place, and time. He has normal reflexes. He exhibits normal muscle tone. Coordination normal.  Skin: Skin is warm and dry. He is not diaphoretic. No erythema. No pallor.  Psychiatric: He has a normal mood and affect. His behavior is normal. Judgment and thought content normal.          Assessment & Plan:   FUO:   I am concerned about his loose tooth and wonder if this might have an underlying infection, dental abscess  We will get a CT maxillofacial   I will workup for FUO  We will get blood cultures x 2 sites, HIV, hepatitis panel, RF, ANA, ESR, CRP, EBV, CMV serologies, QF gold  I will rx him clindamycin for possible dental infection  I would like him to see his dentist as soon as possible  If initail serological workup and CT MF and trip to dentist not helpful will get CT chest and CT abdomen with oral but no IV contrast  I spent greater than 45 minutes with the patient including greater than 50% of time in face to face counsel of the patient and in coordination of their care.   Dental problem: this could be underlying cause of his FUO

## 2013-12-27 ENCOUNTER — Telehealth: Payer: Self-pay | Admitting: *Deleted

## 2013-12-27 LAB — EPSTEIN-BARR VIRUS EARLY D ANTIGEN ANTIBODY, IGG: EBV EA IgG: 5.9 U/mL (ref <9.0)

## 2013-12-27 LAB — EPSTEIN-BARR VIRUS VCA ANTIBODY PANEL
EBV EA IgG: 5.9 U/mL (ref <9.0)
EBV NA IgG: 487 U/mL — ABNORMAL HIGH (ref <18.0)
EBV VCA IgG: 70.7 U/mL — ABNORMAL HIGH (ref <18.0)
EBV VCA IgM: <10 U/mL (ref <36.0)

## 2013-12-27 LAB — CK: Total CK: 33 U/L (ref 7–232)

## 2013-12-27 LAB — HEPATITIS PANEL, ACUTE
HCV Ab: REACTIVE — AB
HEP B C IGM: NONREACTIVE
Hep A IgM: NONREACTIVE
Hepatitis B Surface Ag: NEGATIVE

## 2013-12-27 LAB — C-REACTIVE PROTEIN: CRP: 7.3 mg/dL — ABNORMAL HIGH (ref <0.60)

## 2013-12-27 LAB — SEDIMENTATION RATE: SED RATE: 85 mm/h — AB (ref 0–16)

## 2013-12-27 LAB — RHEUMATOID FACTOR: Rhuematoid fact SerPl-aCnc: <10 IU/mL (ref <=14)

## 2013-12-27 LAB — ANA: Anti Nuclear Antibody(ANA): NEGATIVE

## 2013-12-27 LAB — EPSTEIN-BARR VIRUS NUCLEAR ANTIGEN ANTIBODY, IGG: EBV NA IgG: 487 U/mL — ABNORMAL HIGH (ref <18.0)

## 2013-12-27 LAB — HIV ANTIBODY (ROUTINE TESTING W REFLEX): HIV 1&2 Ab, 4th Generation: NONREACTIVE

## 2013-12-27 NOTE — Telephone Encounter (Signed)
[name] called stating saw infectious disease MD yesterday and has been placed on Clindamycin 300mg  qid and wants to know if any interaction with Coumadin. Pt instructed that there is no interaction between clindamycin and  Coumadin and pt is to see dentist next week regarding possible dental extraction and wants to be seen to have INR checked next week so appt made with time of wife's visit on May 26th.  Nothing is scheduled with dentist at present. Pt states understanding to instructions given

## 2013-12-28 ENCOUNTER — Telehealth: Payer: Self-pay | Admitting: Licensed Clinical Social Worker

## 2013-12-28 ENCOUNTER — Other Ambulatory Visit: Payer: Self-pay | Admitting: Licensed Clinical Social Worker

## 2013-12-28 DIAGNOSIS — K047 Periapical abscess without sinus: Secondary | ICD-10-CM

## 2013-12-28 LAB — CYTOMEGALOVIRUS ANTIBODY, IGG: Cytomegalovirus Ab-IgG: 3.5 U/mL — ABNORMAL HIGH (ref <0.60)

## 2013-12-28 LAB — QUANTIFERON TB GOLD ASSAY (BLOOD)
Interferon Gamma Release Assay: NEGATIVE
Mitogen value: 1.37 IU/mL
QUANTIFERON TB AG MINUS NIL: 0 [IU]/mL
Quantiferon Nil Value: 0.03 IU/mL
TB AG VALUE: 0.03 [IU]/mL

## 2013-12-28 LAB — CMV IGM: CMV IgM: <8 AU/mL (ref <30.00)

## 2013-12-28 MED ORDER — MOXIFLOXACIN HCL 400 MG PO TABS
400.0000 mg | ORAL_TABLET | Freq: Every day | ORAL | Status: DC
Start: 2013-12-28 — End: 2014-11-06

## 2013-12-28 NOTE — Telephone Encounter (Signed)
Patient states that he has very bad indigestion right after he takes the Clindamycin and he can't sleep. He wants to know what does he need to do to prevent this, he did not take his morning dose until he hears from [place]. Please advise

## 2013-12-28 NOTE — Telephone Encounter (Signed)
That is one of the problems with this antibiotic, We could try avelox instead at 400mg  daily. It will be more expensive. It is too bad he has a pencillin allergy

## 2013-12-28 NOTE — Telephone Encounter (Signed)
I spoke with the Dentist that performed that patient' tooth extraction today, he states that he removed the tooth with obvious infection, he does not believe that there are any other teeth that are infected at this time that would cause fever or elevated white count. He states that if this does not help his symptoms he would take another look.

## 2013-12-29 NOTE — Telephone Encounter (Signed)
Avelox sent to the pharmacy

## 2014-01-01 LAB — CULTURE, BLOOD (SINGLE)
Organism ID, Bacteria: NO GROWTH
Organism ID, Bacteria: NO GROWTH

## 2014-01-02 ENCOUNTER — Ambulatory Visit (INDEPENDENT_AMBULATORY_CARE_PROVIDER_SITE_OTHER): Payer: Medicare Other | Admitting: *Deleted

## 2014-01-02 DIAGNOSIS — K922 Gastrointestinal hemorrhage, unspecified: Secondary | ICD-10-CM | POA: Diagnosis not present

## 2014-01-02 DIAGNOSIS — I4891 Unspecified atrial fibrillation: Secondary | ICD-10-CM | POA: Diagnosis not present

## 2014-01-02 LAB — POCT INR: INR: 4

## 2014-01-08 ENCOUNTER — Ambulatory Visit (INDEPENDENT_AMBULATORY_CARE_PROVIDER_SITE_OTHER): Payer: Medicare Other | Admitting: Cardiology

## 2014-01-08 DIAGNOSIS — I4891 Unspecified atrial fibrillation: Secondary | ICD-10-CM | POA: Diagnosis not present

## 2014-01-08 DIAGNOSIS — K922 Gastrointestinal hemorrhage, unspecified: Secondary | ICD-10-CM

## 2014-01-08 DIAGNOSIS — Z7901 Long term (current) use of anticoagulants: Secondary | ICD-10-CM | POA: Diagnosis not present

## 2014-01-08 LAB — PROTIME-INR: INR: 3 — AB (ref 0.9–1.1)

## 2014-01-09 ENCOUNTER — Telehealth: Payer: Self-pay | Admitting: Licensed Clinical Social Worker

## 2014-01-09 NOTE — Telephone Encounter (Signed)
Patient started running a fever today 100.9 before he took a nap, 102 after he woke up. Antibiotics ended on this past Sunday, has some discolored mucous, daughter not sure if this is relevant. CT scan has not been ordered yet, please advise

## 2014-01-10 ENCOUNTER — Other Ambulatory Visit: Payer: Self-pay | Admitting: Infectious Disease

## 2014-01-10 DIAGNOSIS — K047 Periapical abscess without sinus: Secondary | ICD-10-CM

## 2014-01-10 DIAGNOSIS — R509 Fever, unspecified: Secondary | ICD-10-CM

## 2014-01-10 NOTE — Progress Notes (Signed)
I spoke with the patient and I will schedule his ct scan

## 2014-01-10 NOTE — Telephone Encounter (Signed)
I thought I already ordered CT maxillofacial. Lets get taht and get him back to see [place] (he should have appt comign up

## 2014-01-10 NOTE — Addendum Note (Signed)
Addended by: Jarrett Ables D on: 01/10/2014 01:41 PM   Modules accepted: Orders

## 2014-01-10 NOTE — Progress Notes (Signed)
Thanks Tamika. 

## 2014-01-12 ENCOUNTER — Ambulatory Visit (HOSPITAL_COMMUNITY)
Admission: RE | Admit: 2014-01-12 | Discharge: 2014-01-12 | Disposition: A | Discharge status: Home / Self Care | Payer: Medicare Other | Source: Ambulatory Visit | Attending: Infectious Disease | Admitting: Infectious Disease

## 2014-01-12 DIAGNOSIS — K0889 Other specified disorders of teeth and supporting structures: Secondary | ICD-10-CM

## 2014-01-12 DIAGNOSIS — R509 Fever, unspecified: Secondary | ICD-10-CM | POA: Insufficient documentation

## 2014-01-15 ENCOUNTER — Encounter: Payer: Self-pay | Admitting: Infectious Disease

## 2014-01-15 ENCOUNTER — Ambulatory Visit (INDEPENDENT_AMBULATORY_CARE_PROVIDER_SITE_OTHER): Payer: Medicare Other | Admitting: Infectious Disease

## 2014-01-15 VITALS — BP 114/61 | HR 105 | Temp 97.7°F | Ht 63.0 in | Wt 155.0 lb

## 2014-01-15 DIAGNOSIS — J849 Interstitial pulmonary disease, unspecified: Secondary | ICD-10-CM

## 2014-01-15 DIAGNOSIS — R0982 Postnasal drip: Secondary | ICD-10-CM | POA: Diagnosis not present

## 2014-01-15 DIAGNOSIS — D649 Anemia, unspecified: Secondary | ICD-10-CM

## 2014-01-15 DIAGNOSIS — K047 Periapical abscess without sinus: Secondary | ICD-10-CM | POA: Diagnosis not present

## 2014-01-15 DIAGNOSIS — R509 Fever, unspecified: Secondary | ICD-10-CM | POA: Diagnosis not present

## 2014-01-15 DIAGNOSIS — R1310 Dysphagia, unspecified: Secondary | ICD-10-CM

## 2014-01-15 DIAGNOSIS — J8410 Pulmonary fibrosis, unspecified: Secondary | ICD-10-CM | POA: Diagnosis not present

## 2014-01-15 LAB — BASIC METABOLIC PANEL WITH GFR
BUN: 30 mg/dL — ABNORMAL HIGH (ref 6–23)
CO2: 29 meq/L (ref 19–32)
Calcium: 8.4 mg/dL (ref 8.4–10.5)
Chloride: 101 mEq/L (ref 96–112)
Creat: 1.94 mg/dL — ABNORMAL HIGH (ref 0.50–1.35)
GFR, Est African American: 34 mL/min — ABNORMAL LOW
GFR, Est Non African American: 29 mL/min — ABNORMAL LOW
GLUCOSE: 78 mg/dL (ref 70–99)
Potassium: 3.7 mEq/L (ref 3.5–5.3)
SODIUM: 136 meq/L (ref 135–145)

## 2014-01-15 LAB — C-REACTIVE PROTEIN: CRP: 1.7 mg/dL — ABNORMAL HIGH (ref <0.60)

## 2014-01-15 LAB — CK: CK TOTAL: 42 U/L (ref 7–232)

## 2014-01-15 LAB — FERRITIN: Ferritin: 241 ng/mL (ref 22–322)

## 2014-01-15 LAB — SEDIMENTATION RATE: Sed Rate: 60 mm/hr — ABNORMAL HIGH (ref 0–16)

## 2014-01-15 MED ORDER — FLUTICASONE PROPIONATE 50 MCG/ACT NA SUSP: 2.0000 | Freq: Every day | NASAL | Status: DC

## 2014-01-15 NOTE — Progress Notes (Signed)
Subjective:    Patient ID: [name], male    DOB: 1920/05/21, 80 y.o.   MRN: [id number]  HPI   80 year old man with past medical history significant for coronary disease, congestive heart failure hypothyroidism atrial fibrillation on chronic Coumadin, straight total knee arthroplasty in the past history of left atrial tumor removal who has had fevers for approximately 3-4 weeks. Highest temperatures in the 102.6. He had chest x-ray done earlier this month which showed asymmetric interstitial infiltrates. He has been given several courses of antibiotics including doxycycline under milligrams twice daily for 7 days followed by levofloxacin 750 mg once a day for 5 days followed by azithromycin 500 mg x1 followed by 250 milligrams every day to finish 5 days of therapy. Antibiotics have not made a big improvement in his symptoms. Although he is eating better now he lost some weight initially with his fevers and now has had return of his appetite. His predominant symptom besides his fevers had been productive cough with some blood-tinged phlegm. This had recently improved.  Labs done at PCP had shown normal urinalysis with some trace blood he had a normal white blood cell and normal hemoglobin hematocrit and platelets on CBC. His sedimentation rate was slightly elevated at 38 CMP showed normal sodium and potassium normal chloride slightly elevated bicarbonate serum creatinine of 2.2 normal transaminases normal bilirubin normal alkaline phosphatase. PSA was slightly elevated at 5.88. Chest x-ray as mentioned had shown some asymmetric interstitial lung findings.  On closer questioning of the patient at his FIRST visit with me on May 19th, 2015 it turned out that he has a loose molar that has been present for several weeks. I wondered if he may in fact have an infection driving the loosening of his molar and encouraged him to be seen by his dentist. Dentist saw pt believed likely abscess, started  clindamycin remove tooth and fevers defervesced. However he then had fever a few weeks ago. We got CT MF which was unremarkable other than area where tooth was extracted, also with artefact present  Grew up in [place], [place]. 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 Married moved to [facility]. Travelled [place], [place], [place], [place], [place], [place], docked in [place]. In [place] travelled [place], [place], [place], [place],   [place] FUO labs here only remarkable for elevated ESR and CRP.  He continues to complain of post nasal drip if I understand him correctly, early am cough and pills getting stuck in his esophagus at times.  Review of Systems  Constitutional: Positive for fever and chills. Negative for diaphoresis.  HENT: Positive for dental problem. Negative for congestion, rhinorrhea, sinus pressure, sneezing, sore throat and trouble swallowing.   Eyes: Negative for photophobia and visual disturbance.  Respiratory: Positive for cough. Negative for chest tightness, shortness of breath, wheezing and stridor.   Cardiovascular: Negative for chest pain, palpitations and leg swelling.  Gastrointestinal: Negative for nausea, vomiting, abdominal pain, diarrhea, constipation, blood in stool and abdominal distention.  Genitourinary: Negative for dysuria, hematuria, flank pain and difficulty urinating.  Musculoskeletal: Negative for arthralgias, back pain, gait problem, joint swelling and myalgias.  Skin: Negative for color change, pallor, rash and wound.  Neurological: Negative for dizziness, tremors, weakness and light-headedness.  Hematological: Negative for adenopathy. Does not bruise/bleed easily.  Psychiatric/Behavioral: Negative for behavioral problems, confusion, sleep disturbance, dysphoric mood, decreased concentration and agitation.       Objective:   Physical Exam  Constitutional:  He is oriented to person, place, and time. He appears well-developed and  well-nourished. No distress.  HENT:  Head: Normocephalic and atraumatic.  Mouth/Throat: Oropharynx is clear and moist.    Eyes: Conjunctivae and EOM are normal. Pupils are equal, round, and reactive to light. No scleral icterus.  Neck: Normal range of motion. Neck supple. No JVD present.  Cardiovascular: Normal rate and normal heart sounds.  An irregularly irregular rhythm present. Exam reveals no gallop and no friction rub.   No murmur heard. Pulmonary/Chest: Effort normal. No respiratory distress. He has decreased breath sounds in the left lower field. He has no wheezes. He exhibits no tenderness.  Abdominal: He exhibits no distension and no mass. There is no tenderness. There is no rebound and no guarding.  Musculoskeletal: He exhibits no edema and no tenderness.  Lymphadenopathy:    He has no cervical adenopathy.  Neurological: He is alert and oriented to person, place, and time. He has normal reflexes. He exhibits normal muscle tone. Coordination normal.  Skin: Skin is warm and dry. He is not diaphoretic. No erythema. No pallor.  Psychiatric: He has a normal mood and affect. His behavior is normal. Judgment and thought content normal.          Assessment & Plan:   FUO:   I will recheck ESR, CRP today, BMP, I will check SPEP, cryoglobulins,   I will check CT chest without contrast I spent greater than 25 minutes with the patient including greater than 50% of time in face to face counsel of the patient and in coordination of their care.  ? Postnasal drip: will start him on flonase to see if this helps with ssx  Cough, ? intersitial pattern on CXR: check CT  Dental problem sp molar extraction

## 2014-01-16 LAB — ANGIOTENSIN CONVERTING ENZYME: Angiotensin-Converting Enzyme: 25 U/L (ref 8–52)

## 2014-01-17 LAB — PROTEIN ELECTROPHORESIS, SERUM
ALBUMIN ELP: 47.1 % — AB (ref 55.8–66.1)
ALPHA-2-GLOBULIN: 11.4 % (ref 7.1–11.8)
Alpha-1-Globulin: 8.5 % — ABNORMAL HIGH (ref 2.9–4.9)
BETA GLOBULIN: 5.7 % (ref 4.7–7.2)
Beta 2: 4 % (ref 3.2–6.5)
Gamma Globulin: 23.3 % — ABNORMAL HIGH (ref 11.1–18.8)
Total Protein, Serum Electrophoresis: 6.4 g/dL (ref 6.0–8.3)

## 2014-01-22 ENCOUNTER — Ambulatory Visit (INDEPENDENT_AMBULATORY_CARE_PROVIDER_SITE_OTHER): Payer: Medicare Other | Admitting: Interventional Cardiology

## 2014-01-22 DIAGNOSIS — I4891 Unspecified atrial fibrillation: Secondary | ICD-10-CM

## 2014-01-22 DIAGNOSIS — Z7901 Long term (current) use of anticoagulants: Secondary | ICD-10-CM | POA: Diagnosis not present

## 2014-01-22 DIAGNOSIS — K922 Gastrointestinal hemorrhage, unspecified: Secondary | ICD-10-CM

## 2014-01-22 LAB — PROTIME-INR: INR: 3.7 — AB (ref 0.9–1.1)

## 2014-02-05 ENCOUNTER — Ambulatory Visit (INDEPENDENT_AMBULATORY_CARE_PROVIDER_SITE_OTHER): Payer: Medicare Other | Admitting: Cardiology

## 2014-02-05 DIAGNOSIS — I4891 Unspecified atrial fibrillation: Secondary | ICD-10-CM | POA: Diagnosis not present

## 2014-02-05 DIAGNOSIS — Z7901 Long term (current) use of anticoagulants: Secondary | ICD-10-CM | POA: Diagnosis not present

## 2014-02-05 LAB — PROTIME-INR: INR: 3.1 — AB (ref 0.9–1.1)

## 2014-02-14 ENCOUNTER — Ambulatory Visit (INDEPENDENT_AMBULATORY_CARE_PROVIDER_SITE_OTHER): Payer: Medicare Other | Admitting: *Deleted

## 2014-02-14 DIAGNOSIS — I4891 Unspecified atrial fibrillation: Secondary | ICD-10-CM

## 2014-02-14 DIAGNOSIS — K922 Gastrointestinal hemorrhage, unspecified: Secondary | ICD-10-CM

## 2014-02-14 LAB — POCT INR: INR: 3.5

## 2014-02-26 ENCOUNTER — Ambulatory Visit (INDEPENDENT_AMBULATORY_CARE_PROVIDER_SITE_OTHER): Payer: Medicare Other | Admitting: *Deleted

## 2014-02-26 ENCOUNTER — Encounter: Payer: Self-pay | Admitting: Infectious Disease

## 2014-02-26 ENCOUNTER — Ambulatory Visit (INDEPENDENT_AMBULATORY_CARE_PROVIDER_SITE_OTHER): Payer: Medicare Other | Admitting: Infectious Disease

## 2014-02-26 VITALS — BP 129/70 | HR 76 | Temp 97.7°F | Ht 63.0 in | Wt 153.8 lb

## 2014-02-26 DIAGNOSIS — K922 Gastrointestinal hemorrhage, unspecified: Secondary | ICD-10-CM | POA: Diagnosis not present

## 2014-02-26 DIAGNOSIS — R351 Nocturia: Secondary | ICD-10-CM

## 2014-02-26 DIAGNOSIS — R059 Cough, unspecified: Secondary | ICD-10-CM | POA: Diagnosis not present

## 2014-02-26 DIAGNOSIS — I4891 Unspecified atrial fibrillation: Secondary | ICD-10-CM | POA: Diagnosis not present

## 2014-02-26 DIAGNOSIS — R634 Abnormal weight loss: Secondary | ICD-10-CM | POA: Diagnosis not present

## 2014-02-26 DIAGNOSIS — R509 Fever, unspecified: Secondary | ICD-10-CM | POA: Diagnosis not present

## 2014-02-26 DIAGNOSIS — R05 Cough: Secondary | ICD-10-CM | POA: Diagnosis not present

## 2014-02-26 LAB — POCT INR: INR: 2

## 2014-02-26 NOTE — Addendum Note (Signed)
Addended by: Lorne Skeens D on: 02/26/2014 04:25 PM   Modules accepted: Orders

## 2014-02-26 NOTE — Patient Instructions (Signed)
We need CT of your chest, abdomen and pelvis done as soon as possible to get to the bottom of your fevers

## 2014-02-26 NOTE — Progress Notes (Signed)
Subjective:    Patient ID: [name], male    DOB: [birth year], 80 y.o.   MRN: [id number]  HPI   80 year old man with past medical history significant for coronary disease, congestive heart failure hypothyroidism atrial fibrillation on chronic Coumadin, straight total knee arthroplasty in the past history of left atrial tumor removal who has had fevers for approximately 3-4 weeks. Highest temperatures in the 102.6. He had chest x-ray done earlier this month which showed asymmetric interstitial infiltrates. He has been given several courses of antibiotics including doxycycline under milligrams twice daily for 7 days followed by levofloxacin 750 mg once a day for 5 days followed by azithromycin 500 mg x1 followed by 250 milligrams every day to finish 5 days of therapy. Antibiotics have not made a big improvement in his symptoms. Although he is eating better now he lost some weight initially with his fevers and now has had return of his appetite. His predominant symptom besides his fevers had been productive cough with some blood-tinged phlegm. This had recently improved.  Labs done at PCP had shown normal urinalysis with some trace blood he had a normal white blood cell and normal hemoglobin hematocrit and platelets on CBC. His sedimentation rate was slightly elevated at 38 CMP showed normal sodium and potassium normal chloride slightly elevated bicarbonate serum creatinine of 2.2 normal transaminases normal bilirubin normal alkaline phosphatase. PSA was slightly elevated at 5.88. Chest x-ray as mentioned had shown some asymmetric interstitial lung findings.  On closer questioning of the patient at his FIRST visit with me on May 19th, 2015 it turned out that he has a loose molar that has been present for several weeks. I wondered if he may in fact have an infection driving the loosening of his molar and encouraged him to be seen by his dentist. Dentist saw pt believed likely abscess, started  clindamycin remove tooth and fevers defervesced. However he then had fever a few weeks ago. We got CT MF which was unremarkable other than area where tooth was extracted, also with artefact present  Grew up in [place], [place]. 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 Married moved to [facility]. Travelled [place], [place], [place], [place], [place], docked in [place]. In [place] travelled [place], [place], [place], [place],   [place] FUO labs here only remarkable for elevated ESR and CRP.  When I last saw him I checked SPEP which was nonspecific, ANA< RF negative.  We did CT MF = negative.  I had wanted CT chest, not done. I asked him to ONLY record temp with ssx and he did this and had three measured temps but all were above 101.   WE need CT abdomen and pelvis and lungs. He has hx of heavy asbestos esposure. Still some cough  Review of Systems  Constitutional: Positive for fever and chills. Negative for diaphoresis.  HENT: Positive for dental problem. Negative for congestion, rhinorrhea, sinus pressure, sneezing, sore throat and trouble swallowing.   Eyes: Negative for photophobia and visual disturbance.  Respiratory: Positive for cough. Negative for chest tightness, shortness of breath, wheezing and stridor.   Cardiovascular: Negative for chest pain, palpitations and leg swelling.  Gastrointestinal: Negative for nausea, vomiting, abdominal pain, diarrhea, constipation, blood in stool and abdominal distention.  Genitourinary: Negative for dysuria, hematuria, flank pain and difficulty urinating.  Musculoskeletal: Negative for arthralgias, back pain, gait problem, joint swelling and myalgias.  Skin: Negative for color change, pallor, rash and  wound.  Neurological: Negative for dizziness, tremors, weakness and light-headedness.  Hematological: Negative for adenopathy. Does not bruise/bleed easily.  Psychiatric/Behavioral: Negative for behavioral problems,  confusion, sleep disturbance, dysphoric mood, decreased concentration and agitation.       Objective:   Physical Exam  Constitutional: He is oriented to person, place, and time. He appears well-developed and well-nourished. No distress.  HENT:  Head: Normocephalic and atraumatic.  Mouth/Throat: Oropharynx is clear and moist.    Eyes: Conjunctivae and EOM are normal. Pupils are equal, round, and reactive to light. No scleral icterus.  Neck: Normal range of motion. Neck supple. No JVD present.  Cardiovascular: Normal rate and normal heart sounds.  An irregularly irregular rhythm present. Exam reveals no gallop and no friction rub.   No murmur heard. Pulmonary/Chest: Effort normal. No respiratory distress. He has decreased breath sounds in the left lower field. He has no wheezes. He exhibits no tenderness.  Abdominal: He exhibits no distension and no mass. There is no tenderness. There is no rebound and no guarding.  Musculoskeletal: He exhibits no edema and no tenderness.  Lymphadenopathy:    He has no cervical adenopathy.  Neurological: He is alert and oriented to person, place, and time. He has normal reflexes. He exhibits normal muscle tone. Coordination normal.  Skin: Skin is warm and dry. He is not diaphoretic. No erythema. No pallor.  Psychiatric: He has a normal mood and affect. His behavior is normal. Judgment and thought content normal.          Assessment & Plan:   FUO:   I will recheck ESR, CRP today, cBc, CMP,   I will check CT chest without contrast And CT abdomen and pelvis  I will recheck his PSA (he states has been high but no reported bx, so not impossible he could have prostate ca I spent greater than 25 minutes with the patient including greater than 50% of time in face to face counsel of the patient and in coordination of their care.   Cough, ? intersitial pattern on CXR: check CT  Dental problem sp molar extraction

## 2014-03-05 DIAGNOSIS — H35379 Puckering of macula, unspecified eye: Secondary | ICD-10-CM | POA: Diagnosis not present

## 2014-03-05 DIAGNOSIS — H53009 Unspecified amblyopia, unspecified eye: Secondary | ICD-10-CM | POA: Diagnosis not present

## 2014-03-05 DIAGNOSIS — Z961 Presence of intraocular lens: Secondary | ICD-10-CM | POA: Diagnosis not present

## 2014-03-05 DIAGNOSIS — H16109 Unspecified superficial keratitis, unspecified eye: Secondary | ICD-10-CM | POA: Diagnosis not present

## 2014-03-07 ENCOUNTER — Ambulatory Visit
Admission: RE | Admit: 2014-03-07 | Discharge: 2014-03-07 | Disposition: A | Discharge status: Home / Self Care | Payer: Medicare Other | Source: Ambulatory Visit | Attending: Infectious Disease | Admitting: Infectious Disease

## 2014-03-07 ENCOUNTER — Other Ambulatory Visit: Payer: Medicare Other

## 2014-03-07 DIAGNOSIS — R634 Abnormal weight loss: Secondary | ICD-10-CM | POA: Diagnosis not present

## 2014-03-07 DIAGNOSIS — R05 Cough: Secondary | ICD-10-CM

## 2014-03-07 DIAGNOSIS — R509 Fever, unspecified: Secondary | ICD-10-CM | POA: Diagnosis not present

## 2014-03-07 DIAGNOSIS — R351 Nocturia: Secondary | ICD-10-CM | POA: Diagnosis not present

## 2014-03-07 DIAGNOSIS — R141 Gas pain: Secondary | ICD-10-CM | POA: Diagnosis not present

## 2014-03-07 DIAGNOSIS — R142 Eructation: Secondary | ICD-10-CM | POA: Diagnosis not present

## 2014-03-07 DIAGNOSIS — R059 Cough, unspecified: Secondary | ICD-10-CM

## 2014-03-07 DIAGNOSIS — J8410 Pulmonary fibrosis, unspecified: Secondary | ICD-10-CM | POA: Diagnosis not present

## 2014-03-07 DIAGNOSIS — R143 Flatulence: Secondary | ICD-10-CM | POA: Diagnosis not present

## 2014-03-07 LAB — COMPLETE METABOLIC PANEL WITH GFR
ALT: 12 U/L (ref 0–53)
AST: 20 U/L (ref 0–37)
Albumin: 3.7 g/dL (ref 3.5–5.2)
Alkaline Phosphatase: 49 U/L (ref 39–117)
BUN: 39 mg/dL — ABNORMAL HIGH (ref 6–23)
CO2: 29 meq/L (ref 19–32)
CREATININE: 1.98 mg/dL — AB (ref 0.50–1.35)
Calcium: 8.9 mg/dL (ref 8.4–10.5)
Chloride: 98 mEq/L (ref 96–112)
GFR, EST AFRICAN AMERICAN: 33 mL/min — AB
GFR, Est Non African American: 29 mL/min — ABNORMAL LOW
Glucose, Bld: 85 mg/dL (ref 70–99)
Potassium: 4.2 mEq/L (ref 3.5–5.3)
SODIUM: 135 meq/L (ref 135–145)
TOTAL PROTEIN: 7.2 g/dL (ref 6.0–8.3)
Total Bilirubin: 0.5 mg/dL (ref 0.2–1.2)

## 2014-03-07 LAB — TSH: TSH: 1.369 u[IU]/mL (ref 0.350–4.500)

## 2014-03-07 LAB — CBC WITH DIFFERENTIAL/PLATELET
Basophils Absolute: 0.1 10*3/uL (ref 0.0–0.1)
Basophils Relative: 1 % (ref 0–1)
EOS ABS: 0.3 10*3/uL (ref 0.0–0.7)
EOS PCT: 4 % (ref 0–5)
HEMATOCRIT: 33.5 % — AB (ref 39.0–52.0)
Hemoglobin: 11.1 g/dL — ABNORMAL LOW (ref 13.0–17.0)
LYMPHS ABS: 1.9 10*3/uL (ref 0.7–4.0)
LYMPHS PCT: 29 % (ref 12–46)
MCH: 29.1 pg (ref 26.0–34.0)
MCHC: 33.1 g/dL (ref 30.0–36.0)
MCV: 87.7 fL (ref 78.0–100.0)
MONO ABS: 0.5 10*3/uL (ref 0.1–1.0)
Monocytes Relative: 8 % (ref 3–12)
Neutro Abs: 3.8 10*3/uL (ref 1.7–7.7)
Neutrophils Relative %: 58 % (ref 43–77)
PLATELETS: 281 10*3/uL (ref 150–400)
RBC: 3.82 MIL/uL — ABNORMAL LOW (ref 4.22–5.81)
RDW: 14.4 % (ref 11.5–15.5)
WBC: 6.6 10*3/uL (ref 4.0–10.5)

## 2014-03-08 LAB — SJOGRENS SYNDROME-A EXTRACTABLE NUCLEAR ANTIBODY: SSA (RO) (ENA) ANTIBODY, IGG: NEGATIVE

## 2014-03-08 LAB — SJOGRENS SYNDROME-B EXTRACTABLE NUCLEAR ANTIBODY: SSB (LA) (ENA) ANTIBODY, IGG: NEGATIVE

## 2014-03-08 LAB — PSA: PSA: 6.05 ng/mL — ABNORMAL HIGH (ref <=4.00)

## 2014-03-08 LAB — SEDIMENTATION RATE: Sed Rate: 42 mm/hr — ABNORMAL HIGH (ref 0–16)

## 2014-03-12 ENCOUNTER — Ambulatory Visit (INDEPENDENT_AMBULATORY_CARE_PROVIDER_SITE_OTHER): Payer: Medicare Other | Admitting: Pharmacist

## 2014-03-12 DIAGNOSIS — I4891 Unspecified atrial fibrillation: Secondary | ICD-10-CM | POA: Diagnosis not present

## 2014-03-12 DIAGNOSIS — Z7901 Long term (current) use of anticoagulants: Secondary | ICD-10-CM | POA: Diagnosis not present

## 2014-03-12 LAB — PROTIME-INR: INR: 2 — AB (ref 0.9–1.1)

## 2014-03-16 ENCOUNTER — Telehealth: Payer: Self-pay | Admitting: *Deleted

## 2014-03-16 NOTE — Telephone Encounter (Signed)
Patient wants know the results of he CT's done on 03/07/14. Advised the patient will let the doctor know and either I or the doctor will give him a call back asap.

## 2014-03-16 NOTE — Telephone Encounter (Signed)
Per Dr Tommy Medal response called the patient to give him the information and remind him to follow up 04/09/14 as scheduled.

## 2014-03-16 NOTE — Telephone Encounter (Signed)
Everything was completely normal other than his known CAD no evidence of cancer infection or other explanation for her seizures found on CT of his chest or his abdomen and pelvis

## 2014-03-26 ENCOUNTER — Ambulatory Visit (INDEPENDENT_AMBULATORY_CARE_PROVIDER_SITE_OTHER): Payer: Medicare Other | Admitting: Internal Medicine

## 2014-03-26 DIAGNOSIS — M899 Disorder of bone, unspecified: Secondary | ICD-10-CM | POA: Diagnosis not present

## 2014-03-26 DIAGNOSIS — I4891 Unspecified atrial fibrillation: Secondary | ICD-10-CM | POA: Diagnosis not present

## 2014-03-26 DIAGNOSIS — I10 Essential (primary) hypertension: Secondary | ICD-10-CM | POA: Diagnosis not present

## 2014-03-26 DIAGNOSIS — E039 Hypothyroidism, unspecified: Secondary | ICD-10-CM | POA: Diagnosis not present

## 2014-03-26 DIAGNOSIS — Z7901 Long term (current) use of anticoagulants: Secondary | ICD-10-CM | POA: Diagnosis not present

## 2014-03-26 DIAGNOSIS — M949 Disorder of cartilage, unspecified: Secondary | ICD-10-CM | POA: Diagnosis not present

## 2014-03-26 LAB — PROTIME-INR: INR: 2.3 — AB (ref 0.9–1.1)

## 2014-04-02 DIAGNOSIS — E039 Hypothyroidism, unspecified: Secondary | ICD-10-CM | POA: Diagnosis not present

## 2014-04-02 DIAGNOSIS — E785 Hyperlipidemia, unspecified: Secondary | ICD-10-CM | POA: Diagnosis not present

## 2014-04-02 DIAGNOSIS — I2510 Atherosclerotic heart disease of native coronary artery without angina pectoris: Secondary | ICD-10-CM | POA: Diagnosis not present

## 2014-04-02 DIAGNOSIS — I10 Essential (primary) hypertension: Secondary | ICD-10-CM | POA: Diagnosis not present

## 2014-04-09 ENCOUNTER — Encounter: Payer: Self-pay | Admitting: Infectious Disease

## 2014-04-09 ENCOUNTER — Ambulatory Visit (INDEPENDENT_AMBULATORY_CARE_PROVIDER_SITE_OTHER): Payer: Medicare Other | Admitting: Infectious Disease

## 2014-04-09 VITALS — BP 109/65 | HR 79 | Temp 97.8°F | Wt 157.0 lb

## 2014-04-09 DIAGNOSIS — R509 Fever, unspecified: Secondary | ICD-10-CM

## 2014-04-09 DIAGNOSIS — R351 Nocturia: Secondary | ICD-10-CM

## 2014-04-09 DIAGNOSIS — R972 Elevated prostate specific antigen [PSA]: Secondary | ICD-10-CM

## 2014-04-09 DIAGNOSIS — J8410 Pulmonary fibrosis, unspecified: Secondary | ICD-10-CM | POA: Diagnosis not present

## 2014-04-09 DIAGNOSIS — J690 Pneumonitis due to inhalation of food and vomit: Secondary | ICD-10-CM | POA: Diagnosis not present

## 2014-04-09 DIAGNOSIS — T17908A Unspecified foreign body in respiratory tract, part unspecified causing other injury, initial encounter: Secondary | ICD-10-CM | POA: Diagnosis not present
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
## 2014-04-12 ENCOUNTER — Ambulatory Visit (INDEPENDENT_AMBULATORY_CARE_PROVIDER_SITE_OTHER): Payer: Medicare Other | Admitting: Cardiology

## 2014-04-12 DIAGNOSIS — I4891 Unspecified atrial fibrillation: Secondary | ICD-10-CM | POA: Diagnosis not present

## 2014-04-12 DIAGNOSIS — Z7901 Long term (current) use of anticoagulants: Secondary | ICD-10-CM | POA: Diagnosis not present

## 2014-04-12 LAB — PROTIME-INR: INR: 2 — AB (ref 0.9–1.1)

## 2014-04-19 DIAGNOSIS — N184 Chronic kidney disease, stage 4 (severe): Secondary | ICD-10-CM | POA: Diagnosis not present

## 2014-04-23 DIAGNOSIS — D649 Anemia, unspecified: Secondary | ICD-10-CM | POA: Diagnosis not present

## 2014-04-23 DIAGNOSIS — N184 Chronic kidney disease, stage 4 (severe): Secondary | ICD-10-CM | POA: Diagnosis not present

## 2014-04-23 DIAGNOSIS — I10 Essential (primary) hypertension: Secondary | ICD-10-CM | POA: Diagnosis not present

## 2014-05-10 ENCOUNTER — Ambulatory Visit (INDEPENDENT_AMBULATORY_CARE_PROVIDER_SITE_OTHER): Payer: Medicare Other | Admitting: Internal Medicine

## 2014-05-10 DIAGNOSIS — I4891 Unspecified atrial fibrillation: Secondary | ICD-10-CM | POA: Diagnosis not present

## 2014-05-10 DIAGNOSIS — Z7901 Long term (current) use of anticoagulants: Secondary | ICD-10-CM | POA: Diagnosis not present

## 2014-05-10 LAB — PROTIME-INR: INR: 1.6 — AB (ref 0.9–1.1)

## 2014-05-23 DIAGNOSIS — Z23 Encounter for immunization: Secondary | ICD-10-CM | POA: Diagnosis not present

## 2014-05-24 ENCOUNTER — Ambulatory Visit (INDEPENDENT_AMBULATORY_CARE_PROVIDER_SITE_OTHER): Payer: Medicare Other | Admitting: Internal Medicine

## 2014-05-24 DIAGNOSIS — Z7901 Long term (current) use of anticoagulants: Secondary | ICD-10-CM | POA: Diagnosis not present

## 2014-05-24 DIAGNOSIS — I4891 Unspecified atrial fibrillation: Secondary | ICD-10-CM

## 2014-05-24 LAB — PROTIME-INR: INR: 1.8 — AB (ref 0.9–1.1)

## 2014-06-07 ENCOUNTER — Ambulatory Visit (INDEPENDENT_AMBULATORY_CARE_PROVIDER_SITE_OTHER): Payer: Medicare Other | Admitting: Internal Medicine

## 2014-06-07 DIAGNOSIS — I4891 Unspecified atrial fibrillation: Secondary | ICD-10-CM | POA: Diagnosis not present

## 2014-06-07 DIAGNOSIS — Z7901 Long term (current) use of anticoagulants: Secondary | ICD-10-CM | POA: Diagnosis not present

## 2014-06-07 LAB — PROTIME-INR: INR: 2.3 — AB (ref 0.9–1.1)

## 2014-06-21 ENCOUNTER — Ambulatory Visit (INDEPENDENT_AMBULATORY_CARE_PROVIDER_SITE_OTHER): Payer: Medicare Other | Admitting: Cardiology

## 2014-06-21 DIAGNOSIS — I4891 Unspecified atrial fibrillation: Secondary | ICD-10-CM | POA: Diagnosis not present

## 2014-06-21 LAB — PROTIME-INR: INR: 1.8 — AB (ref 0.9–1.1)

## 2014-07-04 ENCOUNTER — Other Ambulatory Visit: Payer: Self-pay | Admitting: Pharmacist

## 2014-07-04 DIAGNOSIS — I4891 Unspecified atrial fibrillation: Secondary | ICD-10-CM

## 2014-07-04 MED ORDER — WARFARIN SODIUM 2.5 MG PO TABS: ORAL_TABLET | ORAL | Status: DC

## 2014-07-09 ENCOUNTER — Ambulatory Visit (INDEPENDENT_AMBULATORY_CARE_PROVIDER_SITE_OTHER): Payer: Medicare Other | Admitting: *Deleted

## 2014-07-09 DIAGNOSIS — Z7901 Long term (current) use of anticoagulants: Secondary | ICD-10-CM | POA: Diagnosis not present

## 2014-07-09 DIAGNOSIS — K922 Gastrointestinal hemorrhage, unspecified: Secondary | ICD-10-CM

## 2014-07-09 DIAGNOSIS — I4891 Unspecified atrial fibrillation: Secondary | ICD-10-CM | POA: Diagnosis not present

## 2014-07-09 LAB — POCT INR: INR: 2.39

## 2014-07-23 ENCOUNTER — Ambulatory Visit (INDEPENDENT_AMBULATORY_CARE_PROVIDER_SITE_OTHER): Payer: Medicare Other | Admitting: *Deleted

## 2014-07-23 DIAGNOSIS — K922 Gastrointestinal hemorrhage, unspecified: Secondary | ICD-10-CM

## 2014-07-23 DIAGNOSIS — I4891 Unspecified atrial fibrillation: Secondary | ICD-10-CM

## 2014-07-23 DIAGNOSIS — Z7901 Long term (current) use of anticoagulants: Secondary | ICD-10-CM | POA: Diagnosis not present

## 2014-07-23 LAB — POCT INR: INR: 2.49

## 2014-08-13 ENCOUNTER — Ambulatory Visit (INDEPENDENT_AMBULATORY_CARE_PROVIDER_SITE_OTHER): Payer: Medicare Other | Admitting: Cardiology

## 2014-08-13 DIAGNOSIS — I4891 Unspecified atrial fibrillation: Secondary | ICD-10-CM | POA: Diagnosis not present

## 2014-08-13 DIAGNOSIS — Z7901 Long term (current) use of anticoagulants: Secondary | ICD-10-CM | POA: Diagnosis not present

## 2014-08-13 LAB — PROTIME-INR: INR: 1.8 — AB (ref 0.9–1.1)

## 2014-08-27 ENCOUNTER — Ambulatory Visit (INDEPENDENT_AMBULATORY_CARE_PROVIDER_SITE_OTHER): Payer: Medicare Other | Admitting: *Deleted

## 2014-08-27 DIAGNOSIS — I4891 Unspecified atrial fibrillation: Secondary | ICD-10-CM

## 2014-08-27 DIAGNOSIS — Z7901 Long term (current) use of anticoagulants: Secondary | ICD-10-CM | POA: Diagnosis not present

## 2014-08-27 DIAGNOSIS — K922 Gastrointestinal hemorrhage, unspecified: Secondary | ICD-10-CM

## 2014-08-27 LAB — POCT INR: INR: 2.86

## 2014-09-10 ENCOUNTER — Ambulatory Visit (INDEPENDENT_AMBULATORY_CARE_PROVIDER_SITE_OTHER): Payer: Medicare Other | Admitting: Cardiovascular Disease

## 2014-09-10 DIAGNOSIS — K922 Gastrointestinal hemorrhage, unspecified: Secondary | ICD-10-CM

## 2014-09-10 DIAGNOSIS — I4891 Unspecified atrial fibrillation: Secondary | ICD-10-CM

## 2014-09-10 DIAGNOSIS — Z7901 Long term (current) use of anticoagulants: Secondary | ICD-10-CM | POA: Diagnosis not present

## 2014-09-10 LAB — PROTIME-INR: INR: 2.4 — AB (ref 0.9–1.1)

## 2014-09-26 DIAGNOSIS — E039 Hypothyroidism, unspecified: Secondary | ICD-10-CM | POA: Diagnosis not present

## 2014-09-26 DIAGNOSIS — E785 Hyperlipidemia, unspecified: Secondary | ICD-10-CM | POA: Diagnosis not present

## 2014-09-26 DIAGNOSIS — I10 Essential (primary) hypertension: Secondary | ICD-10-CM | POA: Diagnosis not present

## 2014-09-26 DIAGNOSIS — Z125 Encounter for screening for malignant neoplasm of prostate: Secondary | ICD-10-CM | POA: Diagnosis not present

## 2014-09-26 DIAGNOSIS — M8580 Other specified disorders of bone density and structure, unspecified site: Secondary | ICD-10-CM | POA: Diagnosis not present

## 2014-09-26 DIAGNOSIS — Z0000 Encounter for general adult medical examination without abnormal findings: Secondary | ICD-10-CM | POA: Diagnosis not present

## 2014-09-26 DIAGNOSIS — Z23 Encounter for immunization: Secondary | ICD-10-CM | POA: Diagnosis not present

## 2014-09-26 DIAGNOSIS — M859 Disorder of bone density and structure, unspecified: Secondary | ICD-10-CM | POA: Diagnosis not present

## 2014-09-26 DIAGNOSIS — Z1389 Encounter for screening for other disorder: Secondary | ICD-10-CM | POA: Diagnosis not present

## 2014-10-03 DIAGNOSIS — E039 Hypothyroidism, unspecified: Secondary | ICD-10-CM | POA: Diagnosis not present

## 2014-10-03 DIAGNOSIS — I10 Essential (primary) hypertension: Secondary | ICD-10-CM | POA: Diagnosis not present

## 2014-10-03 DIAGNOSIS — I2510 Atherosclerotic heart disease of native coronary artery without angina pectoris: Secondary | ICD-10-CM | POA: Diagnosis not present

## 2014-10-03 DIAGNOSIS — E785 Hyperlipidemia, unspecified: Secondary | ICD-10-CM | POA: Diagnosis not present

## 2014-10-11 ENCOUNTER — Ambulatory Visit (INDEPENDENT_AMBULATORY_CARE_PROVIDER_SITE_OTHER): Payer: PRIVATE HEALTH INSURANCE | Admitting: Cardiovascular Disease

## 2014-10-11 DIAGNOSIS — I4891 Unspecified atrial fibrillation: Secondary | ICD-10-CM | POA: Diagnosis not present

## 2014-10-11 LAB — PROTIME-INR: INR: 2.3 — AB (ref 0.9–1.1)

## 2014-10-15 DIAGNOSIS — R2681 Unsteadiness on feet: Secondary | ICD-10-CM | POA: Diagnosis not present

## 2014-10-15 DIAGNOSIS — M6281 Muscle weakness (generalized): Secondary | ICD-10-CM | POA: Diagnosis not present

## 2014-10-15 DIAGNOSIS — R2689 Other abnormalities of gait and mobility: Secondary | ICD-10-CM | POA: Diagnosis not present

## 2014-10-22 DIAGNOSIS — R2689 Other abnormalities of gait and mobility: Secondary | ICD-10-CM | POA: Diagnosis not present

## 2014-10-22 DIAGNOSIS — M6281 Muscle weakness (generalized): Secondary | ICD-10-CM | POA: Diagnosis not present

## 2014-10-22 DIAGNOSIS — R2681 Unsteadiness on feet: Secondary | ICD-10-CM | POA: Diagnosis not present

## 2014-10-25 DIAGNOSIS — R2681 Unsteadiness on feet: Secondary | ICD-10-CM | POA: Diagnosis not present

## 2014-10-25 DIAGNOSIS — R2689 Other abnormalities of gait and mobility: Secondary | ICD-10-CM | POA: Diagnosis not present

## 2014-10-25 DIAGNOSIS — M6281 Muscle weakness (generalized): Secondary | ICD-10-CM | POA: Diagnosis not present

## 2014-10-30 DIAGNOSIS — D649 Anemia, unspecified: Secondary | ICD-10-CM | POA: Diagnosis not present

## 2014-10-30 DIAGNOSIS — N184 Chronic kidney disease, stage 4 (severe): Secondary | ICD-10-CM | POA: Diagnosis not present

## 2014-10-30 DIAGNOSIS — I10 Essential (primary) hypertension: Secondary | ICD-10-CM | POA: Diagnosis not present

## 2014-10-31 DIAGNOSIS — R2689 Other abnormalities of gait and mobility: Secondary | ICD-10-CM | POA: Diagnosis not present

## 2014-10-31 DIAGNOSIS — M6281 Muscle weakness (generalized): Secondary | ICD-10-CM | POA: Diagnosis not present

## 2014-10-31 DIAGNOSIS — R2681 Unsteadiness on feet: Secondary | ICD-10-CM | POA: Diagnosis not present

## 2014-11-02 DIAGNOSIS — R2689 Other abnormalities of gait and mobility: Secondary | ICD-10-CM | POA: Diagnosis not present

## 2014-11-02 DIAGNOSIS — M6281 Muscle weakness (generalized): Secondary | ICD-10-CM | POA: Diagnosis not present

## 2014-11-02 DIAGNOSIS — R2681 Unsteadiness on feet: Secondary | ICD-10-CM | POA: Diagnosis not present

## 2014-11-05 DIAGNOSIS — M6281 Muscle weakness (generalized): Secondary | ICD-10-CM | POA: Diagnosis not present

## 2014-11-05 DIAGNOSIS — R2681 Unsteadiness on feet: Secondary | ICD-10-CM | POA: Diagnosis not present

## 2014-11-05 DIAGNOSIS — R2689 Other abnormalities of gait and mobility: Secondary | ICD-10-CM | POA: Diagnosis not present

## 2014-11-06 ENCOUNTER — Ambulatory Visit (INDEPENDENT_AMBULATORY_CARE_PROVIDER_SITE_OTHER): Payer: Medicare Other | Admitting: Cardiovascular Disease

## 2014-11-06 ENCOUNTER — Encounter: Payer: Self-pay | Admitting: Cardiovascular Disease

## 2014-11-06 VITALS — BP 116/70 | HR 70 | Ht 63.0 in | Wt 166.0 lb

## 2014-11-06 DIAGNOSIS — I2510 Atherosclerotic heart disease of native coronary artery without angina pectoris: Secondary | ICD-10-CM | POA: Diagnosis not present

## 2014-11-06 DIAGNOSIS — I4891 Unspecified atrial fibrillation: Secondary | ICD-10-CM | POA: Diagnosis not present

## 2014-11-06 LAB — LIPID PANEL
CHOL/HDL RATIO: 3
Cholesterol: 149 mg/dL (ref 0–200)
HDL: 47.4 mg/dL (ref >39.00)
LDL Cholesterol: 80 mg/dL (ref 0–99)
NonHDL: 101.6
Triglycerides: 106 mg/dL (ref 0.0–149.0)
VLDL: 21.2 mg/dL (ref 0.0–40.0)

## 2014-11-06 LAB — HEPATIC FUNCTION PANEL
ALT: 13 U/L (ref 0–53)
AST: 17 U/L (ref 0–37)
Albumin: 3.8 g/dL (ref 3.5–5.2)
Alkaline Phosphatase: 60 U/L (ref 39–117)
BILIRUBIN TOTAL: 0.4 mg/dL (ref 0.2–1.2)
Bilirubin, Direct: 0.1 mg/dL (ref 0.0–0.3)
Total Protein: 7.1 g/dL (ref 6.0–8.3)

## 2014-11-06 LAB — BASIC METABOLIC PANEL
BUN: 36 mg/dL — AB (ref 6–23)
CHLORIDE: 100 meq/L (ref 96–112)
CO2: 34 mEq/L — ABNORMAL HIGH (ref 19–32)
Calcium: 9.1 mg/dL (ref 8.4–10.5)
Creatinine, Ser: 1.98 mg/dL — ABNORMAL HIGH (ref 0.40–1.50)
GFR: 33.74 mL/min — ABNORMAL LOW (ref >60.00)
GLUCOSE: 89 mg/dL (ref 70–99)
POTASSIUM: 3.8 meq/L (ref 3.5–5.1)
Sodium: 138 mEq/L (ref 135–145)

## 2014-11-06 MED ORDER — PROPRANOLOL HCL 10 MG PO TABS: 10.0000 mg | ORAL_TABLET | Freq: Four times a day (QID) | ORAL | Status: DC | PRN
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
## 2014-11-06 NOTE — Patient Instructions (Signed)
Your physician recommends that you continue on your current medications as directed. Please refer to the Current Medication list given to you today.  Your physician recommends that you have lab work:  TODAY - cholesterol, liver, bmet  Your physician wants you to follow-up in: 1 year with Dr. Acie Fredrickson.  You will receive a reminder letter in the mail two months in advance. If you don't receive a letter, please call our office to schedule the follow-up appointment. Your physician recommends that you return for lab work in: 1 year on the day of or a few days before your office visit with Dr. Acie Fredrickson.  You will need to FAST for this appointment - nothing to eat or drink after midnight the night before except water.

## 2014-11-08 ENCOUNTER — Ambulatory Visit (INDEPENDENT_AMBULATORY_CARE_PROVIDER_SITE_OTHER): Payer: PRIVATE HEALTH INSURANCE | Admitting: Pharmacist

## 2014-11-08 DIAGNOSIS — Z7901 Long term (current) use of anticoagulants: Secondary | ICD-10-CM | POA: Diagnosis not present

## 2014-11-08 DIAGNOSIS — I4891 Unspecified atrial fibrillation: Secondary | ICD-10-CM | POA: Diagnosis not present

## 2014-11-08 DIAGNOSIS — K922 Gastrointestinal hemorrhage, unspecified: Secondary | ICD-10-CM

## 2014-11-08 LAB — PROTIME-INR: INR: 2.4 — AB (ref 0.9–1.1)

## 2014-11-09 DIAGNOSIS — M6281 Muscle weakness (generalized): Secondary | ICD-10-CM | POA: Diagnosis not present

## 2014-11-09 DIAGNOSIS — R2689 Other abnormalities of gait and mobility: Secondary | ICD-10-CM | POA: Diagnosis not present

## 2014-11-11 ENCOUNTER — Encounter (HOSPITAL_COMMUNITY): Payer: Self-pay | Admitting: Cardiology

## 2014-11-12 DIAGNOSIS — R2689 Other abnormalities of gait and mobility: Secondary | ICD-10-CM | POA: Diagnosis not present

## 2014-11-12 DIAGNOSIS — M6281 Muscle weakness (generalized): Secondary | ICD-10-CM | POA: Diagnosis not present

## 2014-11-14 ENCOUNTER — Telehealth: Payer: Self-pay | Admitting: Nurse Practitioner

## 2014-11-14 DIAGNOSIS — R2689 Other abnormalities of gait and mobility: Secondary | ICD-10-CM | POA: Diagnosis not present

## 2014-11-14 DIAGNOSIS — M6281 Muscle weakness (generalized): Secondary | ICD-10-CM | POA: Diagnosis not present

## 2014-11-14 NOTE — Telephone Encounter (Signed)
Called patient and advised that per Dr. Acie Fredrickson labs look ok; continue current treatment.  Patient verbalized understanding and agreement.

## 2014-11-16 DIAGNOSIS — M6281 Muscle weakness (generalized): Secondary | ICD-10-CM | POA: Diagnosis not present

## 2014-11-16 DIAGNOSIS — R2689 Other abnormalities of gait and mobility: Secondary | ICD-10-CM | POA: Diagnosis not present

## 2014-11-19 DIAGNOSIS — M6281 Muscle weakness (generalized): Secondary | ICD-10-CM | POA: Diagnosis not present

## 2014-11-19 DIAGNOSIS — R2689 Other abnormalities of gait and mobility: Secondary | ICD-10-CM | POA: Diagnosis not present

## 2014-11-21 DIAGNOSIS — M6281 Muscle weakness (generalized): Secondary | ICD-10-CM | POA: Diagnosis not present

## 2014-11-21 DIAGNOSIS — R2689 Other abnormalities of gait and mobility: Secondary | ICD-10-CM | POA: Diagnosis not present

## 2014-12-06 ENCOUNTER — Ambulatory Visit (INDEPENDENT_AMBULATORY_CARE_PROVIDER_SITE_OTHER): Payer: PRIVATE HEALTH INSURANCE | Admitting: Cardiology

## 2014-12-06 DIAGNOSIS — I4891 Unspecified atrial fibrillation: Secondary | ICD-10-CM

## 2014-12-06 DIAGNOSIS — Z7901 Long term (current) use of anticoagulants: Secondary | ICD-10-CM | POA: Diagnosis not present

## 2014-12-06 LAB — PROTIME-INR: INR: 2.7 — AB (ref 0.9–1.1)

## 2015-01-01 DIAGNOSIS — D225 Melanocytic nevi of trunk: Secondary | ICD-10-CM | POA: Diagnosis not present

## 2015-01-01 DIAGNOSIS — L821 Other seborrheic keratosis: Secondary | ICD-10-CM | POA: Diagnosis not present

## 2015-01-01 DIAGNOSIS — L820 Inflamed seborrheic keratosis: Secondary | ICD-10-CM | POA: Diagnosis not present

## 2015-01-01 DIAGNOSIS — L814 Other melanin hyperpigmentation: Secondary | ICD-10-CM | POA: Diagnosis not present

## 2015-01-01 DIAGNOSIS — L570 Actinic keratosis: Secondary | ICD-10-CM | POA: Diagnosis not present

## 2015-01-03 ENCOUNTER — Ambulatory Visit (INDEPENDENT_AMBULATORY_CARE_PROVIDER_SITE_OTHER): Payer: PRIVATE HEALTH INSURANCE | Admitting: Cardiology

## 2015-01-03 DIAGNOSIS — I4891 Unspecified atrial fibrillation: Secondary | ICD-10-CM

## 2015-01-03 DIAGNOSIS — Z7901 Long term (current) use of anticoagulants: Secondary | ICD-10-CM | POA: Diagnosis not present

## 2015-01-03 LAB — PROTIME-INR: INR: 3 — AB (ref 0.9–1.1)

## 2015-01-31 ENCOUNTER — Ambulatory Visit (INDEPENDENT_AMBULATORY_CARE_PROVIDER_SITE_OTHER): Payer: PRIVATE HEALTH INSURANCE | Admitting: Internal Medicine

## 2015-01-31 DIAGNOSIS — Z7901 Long term (current) use of anticoagulants: Secondary | ICD-10-CM | POA: Diagnosis not present

## 2015-01-31 DIAGNOSIS — I4891 Unspecified atrial fibrillation: Secondary | ICD-10-CM

## 2015-01-31 LAB — PROTIME-INR: INR: 3.4 — AB (ref 0.9–1.1)

## 2015-02-21 ENCOUNTER — Ambulatory Visit (INDEPENDENT_AMBULATORY_CARE_PROVIDER_SITE_OTHER): Payer: PRIVATE HEALTH INSURANCE | Admitting: Pharmacist

## 2015-02-21 DIAGNOSIS — Z5181 Encounter for therapeutic drug level monitoring: Secondary | ICD-10-CM | POA: Diagnosis not present

## 2015-02-21 DIAGNOSIS — Z7901 Long term (current) use of anticoagulants: Secondary | ICD-10-CM | POA: Diagnosis not present

## 2015-02-21 DIAGNOSIS — I4891 Unspecified atrial fibrillation: Secondary | ICD-10-CM

## 2015-02-21 LAB — PROTIME-INR: INR: 2.4 — AB (ref 0.9–1.1)

## 2015-03-21 ENCOUNTER — Ambulatory Visit (INDEPENDENT_AMBULATORY_CARE_PROVIDER_SITE_OTHER): Payer: PRIVATE HEALTH INSURANCE | Admitting: Cardiology

## 2015-03-21 DIAGNOSIS — Z7901 Long term (current) use of anticoagulants: Secondary | ICD-10-CM | POA: Diagnosis not present

## 2015-03-21 DIAGNOSIS — I4891 Unspecified atrial fibrillation: Secondary | ICD-10-CM | POA: Diagnosis not present

## 2015-03-21 LAB — PROTIME-INR: INR: 2.7 — AB (ref 0.9–1.1)

## 2015-03-27 DIAGNOSIS — M8580 Other specified disorders of bone density and structure, unspecified site: Secondary | ICD-10-CM | POA: Diagnosis not present

## 2015-03-27 DIAGNOSIS — E039 Hypothyroidism, unspecified: Secondary | ICD-10-CM | POA: Diagnosis not present

## 2015-03-27 DIAGNOSIS — I10 Essential (primary) hypertension: Secondary | ICD-10-CM | POA: Diagnosis not present

## 2015-03-27 DIAGNOSIS — E559 Vitamin D deficiency, unspecified: Secondary | ICD-10-CM | POA: Diagnosis not present

## 2015-04-03 DIAGNOSIS — F3340 Major depressive disorder, recurrent, in remission, unspecified: Secondary | ICD-10-CM | POA: Diagnosis not present

## 2015-04-03 DIAGNOSIS — N184 Chronic kidney disease, stage 4 (severe): Secondary | ICD-10-CM | POA: Diagnosis not present

## 2015-04-03 DIAGNOSIS — Z23 Encounter for immunization: Secondary | ICD-10-CM | POA: Diagnosis not present

## 2015-04-03 DIAGNOSIS — I5032 Chronic diastolic (congestive) heart failure: Secondary | ICD-10-CM | POA: Diagnosis not present

## 2015-04-03 DIAGNOSIS — E785 Hyperlipidemia, unspecified: Secondary | ICD-10-CM | POA: Diagnosis not present

## 2015-04-05 ENCOUNTER — Other Ambulatory Visit (HOSPITAL_COMMUNITY): Payer: Self-pay | Admitting: Internal Medicine

## 2015-04-05 DIAGNOSIS — R011 Cardiac murmur, unspecified: Secondary | ICD-10-CM

## 2015-04-08 ENCOUNTER — Other Ambulatory Visit: Payer: Self-pay

## 2015-04-08 DIAGNOSIS — I4891 Unspecified atrial fibrillation: Secondary | ICD-10-CM

## 2015-04-08 MED ORDER — WARFARIN SODIUM 2.5 MG PO TABS: ORAL_TABLET | ORAL | Status: DC

## 2015-04-11 ENCOUNTER — Ambulatory Visit (HOSPITAL_COMMUNITY): Payer: Medicare Other | Attending: Internal Medicine

## 2015-04-11 ENCOUNTER — Other Ambulatory Visit: Payer: Self-pay

## 2015-04-11 DIAGNOSIS — I2510 Atherosclerotic heart disease of native coronary artery without angina pectoris: Secondary | ICD-10-CM | POA: Insufficient documentation

## 2015-04-11 DIAGNOSIS — R011 Cardiac murmur, unspecified: Secondary | ICD-10-CM | POA: Insufficient documentation

## 2015-04-11 DIAGNOSIS — I340 Nonrheumatic mitral (valve) insufficiency: Secondary | ICD-10-CM | POA: Diagnosis not present

## 2015-04-11 DIAGNOSIS — I129 Hypertensive chronic kidney disease with stage 1 through stage 4 chronic kidney disease, or unspecified chronic kidney disease: Secondary | ICD-10-CM | POA: Diagnosis not present

## 2015-04-11 DIAGNOSIS — I371 Nonrheumatic pulmonary valve insufficiency: Secondary | ICD-10-CM | POA: Diagnosis not present

## 2015-04-11 DIAGNOSIS — E785 Hyperlipidemia, unspecified: Secondary | ICD-10-CM | POA: Diagnosis not present

## 2015-04-11 DIAGNOSIS — I77819 Aortic ectasia, unspecified site: Secondary | ICD-10-CM | POA: Insufficient documentation

## 2015-04-11 DIAGNOSIS — I071 Rheumatic tricuspid insufficiency: Secondary | ICD-10-CM | POA: Diagnosis not present

## 2015-04-11 DIAGNOSIS — N184 Chronic kidney disease, stage 4 (severe): Secondary | ICD-10-CM | POA: Diagnosis not present

## 2015-04-11 DIAGNOSIS — I351 Nonrheumatic aortic (valve) insufficiency: Secondary | ICD-10-CM | POA: Insufficient documentation

## 2015-04-18 ENCOUNTER — Ambulatory Visit (INDEPENDENT_AMBULATORY_CARE_PROVIDER_SITE_OTHER): Payer: Medicare Other | Admitting: Cardiology

## 2015-04-18 DIAGNOSIS — I4891 Unspecified atrial fibrillation: Secondary | ICD-10-CM

## 2015-04-18 DIAGNOSIS — Z7901 Long term (current) use of anticoagulants: Secondary | ICD-10-CM | POA: Diagnosis not present

## 2015-04-18 LAB — PROTIME-INR: INR: 2.7 — AB (ref 0.9–1.1)

## 2015-04-26 DIAGNOSIS — D649 Anemia, unspecified: Secondary | ICD-10-CM | POA: Diagnosis not present

## 2015-04-26 DIAGNOSIS — M545 Low back pain: Secondary | ICD-10-CM | POA: Diagnosis not present

## 2015-04-26 DIAGNOSIS — I10 Essential (primary) hypertension: Secondary | ICD-10-CM | POA: Diagnosis not present

## 2015-04-26 DIAGNOSIS — N184 Chronic kidney disease, stage 4 (severe): Secondary | ICD-10-CM | POA: Diagnosis not present

## 2015-05-01 DIAGNOSIS — M1611 Unilateral primary osteoarthritis, right hip: Secondary | ICD-10-CM | POA: Diagnosis not present

## 2015-05-01 DIAGNOSIS — M47816 Spondylosis without myelopathy or radiculopathy, lumbar region: Secondary | ICD-10-CM | POA: Diagnosis not present

## 2015-05-01 DIAGNOSIS — M5432 Sciatica, left side: Secondary | ICD-10-CM | POA: Diagnosis not present

## 2015-05-01 DIAGNOSIS — M1612 Unilateral primary osteoarthritis, left hip: Secondary | ICD-10-CM | POA: Diagnosis not present

## 2015-05-30 ENCOUNTER — Ambulatory Visit (INDEPENDENT_AMBULATORY_CARE_PROVIDER_SITE_OTHER): Payer: Medicare Other | Admitting: Cardiology

## 2015-05-30 DIAGNOSIS — I4891 Unspecified atrial fibrillation: Secondary | ICD-10-CM | POA: Diagnosis not present

## 2015-05-30 DIAGNOSIS — Z7901 Long term (current) use of anticoagulants: Secondary | ICD-10-CM | POA: Diagnosis not present

## 2015-05-30 LAB — PROTIME-INR: INR: 2.9 — AB (ref 0.9–1.1)

## 2015-06-04 DIAGNOSIS — H9313 Tinnitus, bilateral: Secondary | ICD-10-CM | POA: Diagnosis not present

## 2015-06-04 DIAGNOSIS — H903 Sensorineural hearing loss, bilateral: Secondary | ICD-10-CM | POA: Diagnosis not present

## 2015-07-11 ENCOUNTER — Ambulatory Visit (INDEPENDENT_AMBULATORY_CARE_PROVIDER_SITE_OTHER): Payer: Medicare Other | Admitting: Interventional Cardiology

## 2015-07-11 DIAGNOSIS — Z7901 Long term (current) use of anticoagulants: Secondary | ICD-10-CM | POA: Diagnosis not present

## 2015-07-11 DIAGNOSIS — I4891 Unspecified atrial fibrillation: Secondary | ICD-10-CM | POA: Diagnosis not present

## 2015-07-11 LAB — PROTIME-INR: INR: 2.5 — AB (ref 0.9–1.1)

## 2015-08-16 DIAGNOSIS — M2042 Other hammer toe(s) (acquired), left foot: Secondary | ICD-10-CM | POA: Diagnosis not present

## 2015-08-16 DIAGNOSIS — M2041 Other hammer toe(s) (acquired), right foot: Secondary | ICD-10-CM | POA: Diagnosis not present

## 2015-08-16 DIAGNOSIS — B351 Tinea unguium: Secondary | ICD-10-CM | POA: Diagnosis not present

## 2015-08-22 ENCOUNTER — Ambulatory Visit (INDEPENDENT_AMBULATORY_CARE_PROVIDER_SITE_OTHER): Payer: Medicare Other | Admitting: Pharmacist

## 2015-08-22 DIAGNOSIS — I4891 Unspecified atrial fibrillation: Secondary | ICD-10-CM | POA: Diagnosis not present

## 2015-08-22 DIAGNOSIS — Z7901 Long term (current) use of anticoagulants: Secondary | ICD-10-CM | POA: Diagnosis not present

## 2015-08-22 LAB — POCT INR: INR: 3.09

## 2015-09-19 ENCOUNTER — Ambulatory Visit (INDEPENDENT_AMBULATORY_CARE_PROVIDER_SITE_OTHER): Payer: Medicare Other | Admitting: Internal Medicine

## 2015-09-19 DIAGNOSIS — Z7901 Long term (current) use of anticoagulants: Secondary | ICD-10-CM | POA: Diagnosis not present

## 2015-09-19 DIAGNOSIS — I4891 Unspecified atrial fibrillation: Secondary | ICD-10-CM | POA: Diagnosis not present

## 2015-09-19 LAB — PROTIME-INR: INR: 2.9 — AB (ref 0.9–1.1)

## 2015-10-01 DIAGNOSIS — E559 Vitamin D deficiency, unspecified: Secondary | ICD-10-CM | POA: Diagnosis not present

## 2015-10-01 DIAGNOSIS — Z1389 Encounter for screening for other disorder: Secondary | ICD-10-CM | POA: Diagnosis not present

## 2015-10-01 DIAGNOSIS — I129 Hypertensive chronic kidney disease with stage 1 through stage 4 chronic kidney disease, or unspecified chronic kidney disease: Secondary | ICD-10-CM | POA: Diagnosis not present

## 2015-10-01 DIAGNOSIS — Z0000 Encounter for general adult medical examination without abnormal findings: Secondary | ICD-10-CM | POA: Diagnosis not present

## 2015-10-01 DIAGNOSIS — M8580 Other specified disorders of bone density and structure, unspecified site: Secondary | ICD-10-CM | POA: Diagnosis not present

## 2015-10-01 DIAGNOSIS — E663 Overweight: Secondary | ICD-10-CM | POA: Diagnosis not present

## 2015-10-01 DIAGNOSIS — Z125 Encounter for screening for malignant neoplasm of prostate: Secondary | ICD-10-CM | POA: Diagnosis not present

## 2015-10-01 DIAGNOSIS — E039 Hypothyroidism, unspecified: Secondary | ICD-10-CM | POA: Diagnosis not present

## 2015-10-01 DIAGNOSIS — I5032 Chronic diastolic (congestive) heart failure: Secondary | ICD-10-CM | POA: Diagnosis not present

## 2015-10-01 DIAGNOSIS — E611 Iron deficiency: Secondary | ICD-10-CM | POA: Diagnosis not present

## 2015-10-08 DIAGNOSIS — I509 Heart failure, unspecified: Secondary | ICD-10-CM | POA: Diagnosis not present

## 2015-10-08 DIAGNOSIS — F3340 Major depressive disorder, recurrent, in remission, unspecified: Secondary | ICD-10-CM | POA: Diagnosis not present

## 2015-10-08 DIAGNOSIS — N184 Chronic kidney disease, stage 4 (severe): Secondary | ICD-10-CM | POA: Diagnosis not present

## 2015-10-08 DIAGNOSIS — I129 Hypertensive chronic kidney disease with stage 1 through stage 4 chronic kidney disease, or unspecified chronic kidney disease: Secondary | ICD-10-CM | POA: Diagnosis not present

## 2015-10-28 ENCOUNTER — Ambulatory Visit (INDEPENDENT_AMBULATORY_CARE_PROVIDER_SITE_OTHER): Payer: Medicare Other | Admitting: Cardiovascular Disease

## 2015-10-28 DIAGNOSIS — I4891 Unspecified atrial fibrillation: Secondary | ICD-10-CM

## 2015-10-28 DIAGNOSIS — N184 Chronic kidney disease, stage 4 (severe): Secondary | ICD-10-CM | POA: Diagnosis not present

## 2015-10-28 DIAGNOSIS — I10 Essential (primary) hypertension: Secondary | ICD-10-CM | POA: Diagnosis not present

## 2015-10-28 DIAGNOSIS — Z7901 Long term (current) use of anticoagulants: Secondary | ICD-10-CM | POA: Diagnosis not present

## 2015-10-28 LAB — PROTIME-INR: INR: 3.4 — AB (ref 0.9–1.1)

## 2015-11-11 ENCOUNTER — Ambulatory Visit (INDEPENDENT_AMBULATORY_CARE_PROVIDER_SITE_OTHER): Payer: Medicare Other | Admitting: Cardiovascular Disease

## 2015-11-11 ENCOUNTER — Encounter: Payer: Self-pay | Admitting: Cardiovascular Disease

## 2015-11-11 ENCOUNTER — Ambulatory Visit (INDEPENDENT_AMBULATORY_CARE_PROVIDER_SITE_OTHER): Payer: Medicare Other | Admitting: Internal Medicine

## 2015-11-11 VITALS — BP 120/70 | HR 76 | Ht 63.0 in | Wt 166.0 lb

## 2015-11-11 DIAGNOSIS — I4891 Unspecified atrial fibrillation: Secondary | ICD-10-CM

## 2015-11-11 DIAGNOSIS — Z79899 Other long term (current) drug therapy: Secondary | ICD-10-CM

## 2015-11-11 DIAGNOSIS — I2510 Atherosclerotic heart disease of native coronary artery without angina pectoris: Secondary | ICD-10-CM | POA: Diagnosis not present

## 2015-11-11 DIAGNOSIS — Z7901 Long term (current) use of anticoagulants: Secondary | ICD-10-CM | POA: Diagnosis not present

## 2015-11-11 DIAGNOSIS — I482 Chronic atrial fibrillation, unspecified: Secondary | ICD-10-CM

## 2015-11-11 LAB — PROTIME-INR: INR: 3 — AB (ref 0.9–1.1)

## 2015-11-11 NOTE — Patient Instructions (Signed)
Medication Instructions:  Your physician recommends that you continue on your current medications as directed. Please refer to the Current Medication list given to you today.  Labwork: Your physician recommends that you return for lab work in: 12 months for BMET, LFTs, Lipid    Testing/Procedures: NONE  Follow-Up: Your physician wants you to follow-up in: 12 months with Dr. Acie Fredrickson.  You will receive a reminder letter in the mail two months in advance. If you don't receive a letter, please call our office to schedule the follow-up appointment.   If you need a refill on your cardiac medications before your next appointment, please call your pharmacy.
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
## 2015-11-14 DIAGNOSIS — M25561 Pain in right knee: Secondary | ICD-10-CM | POA: Diagnosis not present

## 2015-11-14 DIAGNOSIS — M25562 Pain in left knee: Secondary | ICD-10-CM | POA: Diagnosis not present

## 2015-11-14 DIAGNOSIS — M170 Bilateral primary osteoarthritis of knee: Secondary | ICD-10-CM | POA: Diagnosis not present

## 2015-11-25 ENCOUNTER — Ambulatory Visit (INDEPENDENT_AMBULATORY_CARE_PROVIDER_SITE_OTHER): Payer: Medicare Other | Admitting: Cardiovascular Disease

## 2015-11-25 DIAGNOSIS — Z7901 Long term (current) use of anticoagulants: Secondary | ICD-10-CM | POA: Diagnosis not present

## 2015-11-25 DIAGNOSIS — I482 Chronic atrial fibrillation, unspecified: Secondary | ICD-10-CM

## 2015-11-25 DIAGNOSIS — I4891 Unspecified atrial fibrillation: Secondary | ICD-10-CM | POA: Diagnosis not present

## 2015-11-25 LAB — PROTIME-INR: INR: 3.4 — AB (ref 0.9–1.1)

## 2015-12-09 ENCOUNTER — Ambulatory Visit (INDEPENDENT_AMBULATORY_CARE_PROVIDER_SITE_OTHER): Payer: Medicare Other | Admitting: Internal Medicine

## 2015-12-09 DIAGNOSIS — I482 Chronic atrial fibrillation, unspecified: Secondary | ICD-10-CM

## 2015-12-09 DIAGNOSIS — I4891 Unspecified atrial fibrillation: Secondary | ICD-10-CM | POA: Diagnosis not present

## 2015-12-09 DIAGNOSIS — Z7901 Long term (current) use of anticoagulants: Secondary | ICD-10-CM | POA: Diagnosis not present

## 2015-12-09 LAB — PROTIME-INR: INR: 2.1 — AB (ref 0.9–1.1)

## 2015-12-23 ENCOUNTER — Ambulatory Visit (INDEPENDENT_AMBULATORY_CARE_PROVIDER_SITE_OTHER): Payer: Medicare Other | Admitting: Interventional Cardiology

## 2015-12-23 DIAGNOSIS — Z7901 Long term (current) use of anticoagulants: Secondary | ICD-10-CM | POA: Diagnosis not present

## 2015-12-23 DIAGNOSIS — I4891 Unspecified atrial fibrillation: Secondary | ICD-10-CM | POA: Diagnosis not present

## 2015-12-23 DIAGNOSIS — I482 Chronic atrial fibrillation, unspecified: Secondary | ICD-10-CM

## 2015-12-23 LAB — PROTIME-INR: INR: 2.7 — AB (ref 0.9–1.1)

## 2016-01-20 ENCOUNTER — Ambulatory Visit (INDEPENDENT_AMBULATORY_CARE_PROVIDER_SITE_OTHER): Payer: Medicare Other | Admitting: Cardiology

## 2016-01-20 DIAGNOSIS — I482 Chronic atrial fibrillation, unspecified: Secondary | ICD-10-CM

## 2016-01-20 DIAGNOSIS — I4891 Unspecified atrial fibrillation: Secondary | ICD-10-CM | POA: Diagnosis not present

## 2016-01-20 DIAGNOSIS — Z7901 Long term (current) use of anticoagulants: Secondary | ICD-10-CM | POA: Diagnosis not present

## 2016-01-20 DIAGNOSIS — D1801 Hemangioma of skin and subcutaneous tissue: Secondary | ICD-10-CM | POA: Diagnosis not present

## 2016-01-20 DIAGNOSIS — L821 Other seborrheic keratosis: Secondary | ICD-10-CM | POA: Diagnosis not present

## 2016-01-20 DIAGNOSIS — L570 Actinic keratosis: Secondary | ICD-10-CM | POA: Diagnosis not present

## 2016-01-20 LAB — PROTIME-INR: INR: 2.2 — AB (ref 0.9–1.1)

## 2016-02-03 ENCOUNTER — Other Ambulatory Visit: Payer: Self-pay | Admitting: Pharmacist

## 2016-02-03 DIAGNOSIS — I4891 Unspecified atrial fibrillation: Secondary | ICD-10-CM

## 2016-02-03 MED ORDER — WARFARIN SODIUM 2.5 MG PO TABS: ORAL_TABLET | ORAL | Status: DC

## 2016-02-17 ENCOUNTER — Ambulatory Visit (INDEPENDENT_AMBULATORY_CARE_PROVIDER_SITE_OTHER): Payer: Medicare Other

## 2016-02-17 DIAGNOSIS — I4891 Unspecified atrial fibrillation: Secondary | ICD-10-CM | POA: Diagnosis not present

## 2016-02-17 DIAGNOSIS — Z7901 Long term (current) use of anticoagulants: Secondary | ICD-10-CM | POA: Diagnosis not present

## 2016-02-17 DIAGNOSIS — I482 Chronic atrial fibrillation, unspecified: Secondary | ICD-10-CM

## 2016-02-17 LAB — PROTIME-INR: INR: 1.9 — AB (ref 0.9–1.1)

## 2016-03-13 DIAGNOSIS — M1712 Unilateral primary osteoarthritis, left knee: Secondary | ICD-10-CM | POA: Diagnosis not present

## 2016-03-13 DIAGNOSIS — M25562 Pain in left knee: Secondary | ICD-10-CM | POA: Diagnosis not present

## 2016-03-16 ENCOUNTER — Ambulatory Visit (INDEPENDENT_AMBULATORY_CARE_PROVIDER_SITE_OTHER): Payer: Medicare Other | Admitting: Cardiology

## 2016-03-16 DIAGNOSIS — I4891 Unspecified atrial fibrillation: Secondary | ICD-10-CM | POA: Diagnosis not present

## 2016-03-16 DIAGNOSIS — Z7901 Long term (current) use of anticoagulants: Secondary | ICD-10-CM | POA: Diagnosis not present

## 2016-03-16 DIAGNOSIS — I482 Chronic atrial fibrillation, unspecified: Secondary | ICD-10-CM

## 2016-03-16 LAB — PROTIME-INR: INR: 2.8 — AB (ref 0.9–1.1)

## 2016-03-31 DIAGNOSIS — N400 Enlarged prostate without lower urinary tract symptoms: Secondary | ICD-10-CM | POA: Diagnosis not present

## 2016-03-31 DIAGNOSIS — I10 Essential (primary) hypertension: Secondary | ICD-10-CM | POA: Diagnosis not present

## 2016-03-31 DIAGNOSIS — I509 Heart failure, unspecified: Secondary | ICD-10-CM | POA: Diagnosis not present

## 2016-03-31 DIAGNOSIS — E559 Vitamin D deficiency, unspecified: Secondary | ICD-10-CM | POA: Diagnosis not present

## 2016-03-31 DIAGNOSIS — M8580 Other specified disorders of bone density and structure, unspecified site: Secondary | ICD-10-CM | POA: Diagnosis not present

## 2016-04-06 DIAGNOSIS — I270 Primary pulmonary hypertension: Secondary | ICD-10-CM | POA: Diagnosis not present

## 2016-04-06 DIAGNOSIS — Z23 Encounter for immunization: Secondary | ICD-10-CM | POA: Diagnosis not present

## 2016-04-06 DIAGNOSIS — I129 Hypertensive chronic kidney disease with stage 1 through stage 4 chronic kidney disease, or unspecified chronic kidney disease: Secondary | ICD-10-CM | POA: Diagnosis not present

## 2016-04-06 DIAGNOSIS — F3340 Major depressive disorder, recurrent, in remission, unspecified: Secondary | ICD-10-CM | POA: Diagnosis not present

## 2016-04-06 DIAGNOSIS — R05 Cough: Secondary | ICD-10-CM | POA: Diagnosis not present

## 2016-04-06 DIAGNOSIS — N184 Chronic kidney disease, stage 4 (severe): Secondary | ICD-10-CM | POA: Diagnosis not present

## 2016-04-16 ENCOUNTER — Ambulatory Visit (INDEPENDENT_AMBULATORY_CARE_PROVIDER_SITE_OTHER): Payer: Medicare Other | Admitting: Cardiovascular Disease

## 2016-04-16 DIAGNOSIS — I482 Chronic atrial fibrillation, unspecified: Secondary | ICD-10-CM

## 2016-04-16 DIAGNOSIS — I4891 Unspecified atrial fibrillation: Secondary | ICD-10-CM | POA: Diagnosis not present

## 2016-04-16 DIAGNOSIS — Z7901 Long term (current) use of anticoagulants: Secondary | ICD-10-CM | POA: Diagnosis not present

## 2016-04-16 LAB — PROTIME-INR: INR: 2 — AB (ref 0.9–1.1)

## 2016-04-28 DIAGNOSIS — N184 Chronic kidney disease, stage 4 (severe): Secondary | ICD-10-CM | POA: Diagnosis not present

## 2016-05-05 DIAGNOSIS — N184 Chronic kidney disease, stage 4 (severe): Secondary | ICD-10-CM | POA: Diagnosis not present

## 2016-05-05 DIAGNOSIS — D649 Anemia, unspecified: Secondary | ICD-10-CM | POA: Diagnosis not present

## 2016-05-05 DIAGNOSIS — I10 Essential (primary) hypertension: Secondary | ICD-10-CM | POA: Diagnosis not present

## 2016-05-14 ENCOUNTER — Ambulatory Visit (INDEPENDENT_AMBULATORY_CARE_PROVIDER_SITE_OTHER): Payer: Medicare Other | Admitting: Internal Medicine

## 2016-05-14 DIAGNOSIS — Z7901 Long term (current) use of anticoagulants: Secondary | ICD-10-CM | POA: Diagnosis not present

## 2016-05-14 DIAGNOSIS — I482 Chronic atrial fibrillation, unspecified: Secondary | ICD-10-CM

## 2016-05-14 DIAGNOSIS — I4891 Unspecified atrial fibrillation: Secondary | ICD-10-CM | POA: Diagnosis not present

## 2016-05-14 LAB — PROTIME-INR: INR: 2.1 — AB (ref 0.9–1.1)

## 2016-06-25 ENCOUNTER — Ambulatory Visit (INDEPENDENT_AMBULATORY_CARE_PROVIDER_SITE_OTHER): Payer: Medicare Other

## 2016-06-25 DIAGNOSIS — I482 Chronic atrial fibrillation, unspecified: Secondary | ICD-10-CM

## 2016-06-25 DIAGNOSIS — Z7901 Long term (current) use of anticoagulants: Secondary | ICD-10-CM | POA: Diagnosis not present

## 2016-06-25 DIAGNOSIS — I4891 Unspecified atrial fibrillation: Secondary | ICD-10-CM | POA: Diagnosis not present

## 2016-06-25 LAB — PROTIME-INR: INR: 2.1 — AB (ref 0.9–1.1)

## 2016-07-07 DIAGNOSIS — L570 Actinic keratosis: Secondary | ICD-10-CM | POA: Diagnosis not present

## 2016-07-07 DIAGNOSIS — L821 Other seborrheic keratosis: Secondary | ICD-10-CM | POA: Diagnosis not present

## 2016-07-07 DIAGNOSIS — D1801 Hemangioma of skin and subcutaneous tissue: Secondary | ICD-10-CM | POA: Diagnosis not present

## 2016-08-06 ENCOUNTER — Ambulatory Visit (INDEPENDENT_AMBULATORY_CARE_PROVIDER_SITE_OTHER): Payer: Medicare Other | Admitting: Internal Medicine

## 2016-08-06 DIAGNOSIS — I482 Chronic atrial fibrillation, unspecified: Secondary | ICD-10-CM

## 2016-08-06 DIAGNOSIS — Z7901 Long term (current) use of anticoagulants: Secondary | ICD-10-CM | POA: Diagnosis not present

## 2016-08-06 DIAGNOSIS — I4891 Unspecified atrial fibrillation: Secondary | ICD-10-CM | POA: Diagnosis not present

## 2016-08-06 LAB — PROTIME-INR: INR: 1.8 — AB (ref 0.9–1.1)

## 2016-09-03 ENCOUNTER — Ambulatory Visit (INDEPENDENT_AMBULATORY_CARE_PROVIDER_SITE_OTHER): Payer: Medicare Other | Admitting: Cardiovascular Disease

## 2016-09-03 DIAGNOSIS — Z7901 Long term (current) use of anticoagulants: Secondary | ICD-10-CM | POA: Diagnosis not present

## 2016-09-03 DIAGNOSIS — I4891 Unspecified atrial fibrillation: Secondary | ICD-10-CM | POA: Diagnosis not present

## 2016-09-03 LAB — POCT INR: INR: 2.6

## 2016-09-17 ENCOUNTER — Ambulatory Visit (INDEPENDENT_AMBULATORY_CARE_PROVIDER_SITE_OTHER): Payer: Medicare Other | Admitting: Cardiovascular Disease

## 2016-09-17 DIAGNOSIS — Z7901 Long term (current) use of anticoagulants: Secondary | ICD-10-CM | POA: Diagnosis not present

## 2016-09-17 DIAGNOSIS — I4891 Unspecified atrial fibrillation: Secondary | ICD-10-CM | POA: Diagnosis not present

## 2016-09-17 LAB — PROTIME-INR: INR: 2.1 — AB (ref 0.9–1.1)

## 2016-09-30 DIAGNOSIS — E785 Hyperlipidemia, unspecified: Secondary | ICD-10-CM | POA: Diagnosis not present

## 2016-09-30 DIAGNOSIS — E559 Vitamin D deficiency, unspecified: Secondary | ICD-10-CM | POA: Diagnosis not present

## 2016-09-30 DIAGNOSIS — Z125 Encounter for screening for malignant neoplasm of prostate: Secondary | ICD-10-CM | POA: Diagnosis not present

## 2016-09-30 DIAGNOSIS — I129 Hypertensive chronic kidney disease with stage 1 through stage 4 chronic kidney disease, or unspecified chronic kidney disease: Secondary | ICD-10-CM | POA: Diagnosis not present

## 2016-09-30 DIAGNOSIS — E039 Hypothyroidism, unspecified: Secondary | ICD-10-CM | POA: Diagnosis not present

## 2016-09-30 LAB — PSA: PROSTATE SPECIFIC AG, SERUM: 7.14

## 2016-10-07 DIAGNOSIS — F3340 Major depressive disorder, recurrent, in remission, unspecified: Secondary | ICD-10-CM | POA: Diagnosis not present

## 2016-10-07 DIAGNOSIS — I270 Primary pulmonary hypertension: Secondary | ICD-10-CM | POA: Diagnosis not present

## 2016-10-07 DIAGNOSIS — J8410 Pulmonary fibrosis, unspecified: Secondary | ICD-10-CM | POA: Diagnosis not present

## 2016-10-07 DIAGNOSIS — I4891 Unspecified atrial fibrillation: Secondary | ICD-10-CM | POA: Diagnosis not present

## 2016-10-09 DIAGNOSIS — N184 Chronic kidney disease, stage 4 (severe): Secondary | ICD-10-CM | POA: Diagnosis not present

## 2016-10-29 ENCOUNTER — Ambulatory Visit (INDEPENDENT_AMBULATORY_CARE_PROVIDER_SITE_OTHER): Payer: Medicare Other | Admitting: Cardiology

## 2016-10-29 DIAGNOSIS — Z7901 Long term (current) use of anticoagulants: Secondary | ICD-10-CM | POA: Diagnosis not present

## 2016-10-29 DIAGNOSIS — I4891 Unspecified atrial fibrillation: Secondary | ICD-10-CM | POA: Diagnosis not present

## 2016-10-29 LAB — PROTIME-INR: INR: 1.9 — AB (ref 0.9–1.1)

## 2016-11-02 DIAGNOSIS — N184 Chronic kidney disease, stage 4 (severe): Secondary | ICD-10-CM | POA: Diagnosis not present

## 2016-11-02 DIAGNOSIS — I10 Essential (primary) hypertension: Secondary | ICD-10-CM | POA: Diagnosis not present

## 2016-11-02 DIAGNOSIS — D649 Anemia, unspecified: Secondary | ICD-10-CM | POA: Diagnosis not present

## 2016-11-12 ENCOUNTER — Ambulatory Visit (INDEPENDENT_AMBULATORY_CARE_PROVIDER_SITE_OTHER): Payer: Medicare Other | Admitting: Cardiology

## 2016-11-12 ENCOUNTER — Encounter: Payer: Self-pay | Admitting: Cardiovascular Disease

## 2016-11-12 DIAGNOSIS — I4891 Unspecified atrial fibrillation: Secondary | ICD-10-CM | POA: Diagnosis not present

## 2016-11-12 DIAGNOSIS — Z7901 Long term (current) use of anticoagulants: Secondary | ICD-10-CM | POA: Diagnosis not present

## 2016-11-12 LAB — PROTIME-INR: INR: 2 — AB (ref 0.9–1.1)

## 2016-11-30 ENCOUNTER — Ambulatory Visit: Payer: Medicare Other | Admitting: Cardiovascular Disease

## 2016-12-07 ENCOUNTER — Ambulatory Visit (INDEPENDENT_AMBULATORY_CARE_PROVIDER_SITE_OTHER): Payer: Medicare Other

## 2016-12-07 DIAGNOSIS — Z5181 Encounter for therapeutic drug level monitoring: Secondary | ICD-10-CM | POA: Diagnosis not present

## 2016-12-07 DIAGNOSIS — I4891 Unspecified atrial fibrillation: Secondary | ICD-10-CM | POA: Diagnosis not present

## 2016-12-07 LAB — PROTIME-INR: INR: 2 — AB (ref 0.9–1.1)

## 2016-12-10 ENCOUNTER — Encounter: Payer: Self-pay | Admitting: Cardiovascular Disease

## 2016-12-10 ENCOUNTER — Ambulatory Visit (INDEPENDENT_AMBULATORY_CARE_PROVIDER_SITE_OTHER): Payer: Medicare Other | Admitting: Cardiovascular Disease

## 2016-12-10 VITALS — BP 130/62 | HR 84 | Ht 63.0 in | Wt 160.1 lb

## 2016-12-10 DIAGNOSIS — I2510 Atherosclerotic heart disease of native coronary artery without angina pectoris: Secondary | ICD-10-CM | POA: Diagnosis not present

## 2016-12-10 DIAGNOSIS — I482 Chronic atrial fibrillation, unspecified: Secondary | ICD-10-CM

## 2016-12-10 NOTE — Patient Instructions (Addendum)
Medication Instructions:  Your physician recommends that you continue on your current medications as directed. Please refer to the Current Medication list given to you today.   Labwork: None Ordered   Testing/Procedures: None Ordered   Follow-Up: Your physician wants you to follow-up in: 6 months with Dr. Acie Fredrickson.  You will receive a reminder letter in the mail two months in advance. If you don't receive a letter, please call our office to schedule the follow-up appointment.   If you need a refill on your cardiac medications before your next appointment, please call your pharmacy.   Thank you for choosing [facility]! Christen Bame, RN [phone]     For your  leg edema you  should do  the following 1. Leg elevation - I recommend the Lounge Dr. Leg rest.  See below for details  2. Salt restriction  -  Use potassium chloride instead of regular salt as a salt substitute. 3. Walk regularly 4. Compression hose - [facility] 5. Weight loss     Go to [email or web address]
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
## 2017-01-07 ENCOUNTER — Ambulatory Visit (INDEPENDENT_AMBULATORY_CARE_PROVIDER_SITE_OTHER): Payer: Medicare Other | Admitting: Cardiovascular Disease

## 2017-01-07 DIAGNOSIS — I4891 Unspecified atrial fibrillation: Secondary | ICD-10-CM | POA: Diagnosis not present

## 2017-01-07 LAB — PROTIME-INR: INR: 1.9 — AB (ref 0.9–1.1)

## 2017-01-21 ENCOUNTER — Ambulatory Visit (INDEPENDENT_AMBULATORY_CARE_PROVIDER_SITE_OTHER): Payer: Medicare Other | Admitting: Cardiology

## 2017-01-21 DIAGNOSIS — Z7901 Long term (current) use of anticoagulants: Secondary | ICD-10-CM | POA: Diagnosis not present

## 2017-01-21 DIAGNOSIS — I4891 Unspecified atrial fibrillation: Secondary | ICD-10-CM | POA: Diagnosis not present

## 2017-01-21 LAB — PROTIME-INR: INR: 2.8 — AB (ref 0.9–1.1)

## 2017-01-26 ENCOUNTER — Other Ambulatory Visit: Payer: Self-pay | Admitting: *Deleted

## 2017-01-26 MED ORDER — WARFARIN SODIUM 2.5 MG PO TABS: ORAL_TABLET | ORAL | 1 refills | Status: DC

## 2017-01-26 NOTE — Telephone Encounter (Signed)
Refill done as requested 

## 2017-02-11 ENCOUNTER — Ambulatory Visit (INDEPENDENT_AMBULATORY_CARE_PROVIDER_SITE_OTHER): Payer: Medicare Other | Admitting: Internal Medicine

## 2017-02-11 DIAGNOSIS — I2510 Atherosclerotic heart disease of native coronary artery without angina pectoris: Secondary | ICD-10-CM | POA: Diagnosis not present

## 2017-02-11 DIAGNOSIS — I4891 Unspecified atrial fibrillation: Secondary | ICD-10-CM

## 2017-02-11 LAB — PROTIME-INR: INR: 2.5 — AB (ref 0.9–1.1)

## 2017-03-11 ENCOUNTER — Ambulatory Visit (INDEPENDENT_AMBULATORY_CARE_PROVIDER_SITE_OTHER): Payer: Medicare Other | Admitting: Cardiology

## 2017-03-11 DIAGNOSIS — I4891 Unspecified atrial fibrillation: Secondary | ICD-10-CM

## 2017-03-11 LAB — PROTIME-INR: INR: 2.4 — AB (ref 0.9–1.1)

## 2017-03-31 DIAGNOSIS — E039 Hypothyroidism, unspecified: Secondary | ICD-10-CM | POA: Diagnosis not present

## 2017-03-31 DIAGNOSIS — E785 Hyperlipidemia, unspecified: Secondary | ICD-10-CM | POA: Diagnosis not present

## 2017-03-31 DIAGNOSIS — M8580 Other specified disorders of bone density and structure, unspecified site: Secondary | ICD-10-CM | POA: Diagnosis not present

## 2017-03-31 DIAGNOSIS — I10 Essential (primary) hypertension: Secondary | ICD-10-CM | POA: Diagnosis not present

## 2017-03-31 DIAGNOSIS — I129 Hypertensive chronic kidney disease with stage 1 through stage 4 chronic kidney disease, or unspecified chronic kidney disease: Secondary | ICD-10-CM | POA: Diagnosis not present

## 2017-03-31 LAB — TSH: TSH: 2.29

## 2017-04-02 ENCOUNTER — Encounter: Payer: Self-pay | Admitting: Pharmacist

## 2017-04-07 DIAGNOSIS — I2510 Atherosclerotic heart disease of native coronary artery without angina pectoris: Secondary | ICD-10-CM | POA: Diagnosis not present

## 2017-04-07 DIAGNOSIS — F3340 Major depressive disorder, recurrent, in remission, unspecified: Secondary | ICD-10-CM | POA: Diagnosis not present

## 2017-04-07 DIAGNOSIS — I4891 Unspecified atrial fibrillation: Secondary | ICD-10-CM | POA: Diagnosis not present

## 2017-04-07 DIAGNOSIS — N184 Chronic kidney disease, stage 4 (severe): Secondary | ICD-10-CM | POA: Diagnosis not present

## 2017-04-08 DIAGNOSIS — Z23 Encounter for immunization: Secondary | ICD-10-CM | POA: Diagnosis not present

## 2017-04-15 ENCOUNTER — Ambulatory Visit (INDEPENDENT_AMBULATORY_CARE_PROVIDER_SITE_OTHER): Payer: Medicare Other | Admitting: Cardiology

## 2017-04-15 DIAGNOSIS — I2510 Atherosclerotic heart disease of native coronary artery without angina pectoris: Secondary | ICD-10-CM | POA: Diagnosis not present

## 2017-04-15 DIAGNOSIS — Z7689 Persons encountering health services in other specified circumstances: Secondary | ICD-10-CM | POA: Insufficient documentation

## 2017-04-15 DIAGNOSIS — I4891 Unspecified atrial fibrillation: Secondary | ICD-10-CM

## 2017-04-15 DIAGNOSIS — Z5181 Encounter for therapeutic drug level monitoring: Secondary | ICD-10-CM | POA: Diagnosis not present

## 2017-04-15 DIAGNOSIS — Z7901 Long term (current) use of anticoagulants: Secondary | ICD-10-CM | POA: Diagnosis not present

## 2017-04-15 LAB — PROTIME-INR: INR: 2.2 — AB (ref 0.9–1.1)

## 2017-05-10 ENCOUNTER — Other Ambulatory Visit: Payer: Self-pay | Admitting: *Deleted

## 2017-05-10 MED ORDER — WARFARIN SODIUM 2.5 MG PO TABS: ORAL_TABLET | ORAL | 1 refills | Status: DC

## 2017-05-26 ENCOUNTER — Encounter: Payer: Self-pay | Admitting: Physician Assistant

## 2017-05-27 ENCOUNTER — Ambulatory Visit (INDEPENDENT_AMBULATORY_CARE_PROVIDER_SITE_OTHER): Payer: Medicare Other | Admitting: Interventional Cardiology

## 2017-05-27 DIAGNOSIS — Z7901 Long term (current) use of anticoagulants: Secondary | ICD-10-CM | POA: Diagnosis not present

## 2017-05-27 DIAGNOSIS — Z5181 Encounter for therapeutic drug level monitoring: Secondary | ICD-10-CM | POA: Diagnosis not present

## 2017-05-27 DIAGNOSIS — I4891 Unspecified atrial fibrillation: Secondary | ICD-10-CM | POA: Diagnosis not present

## 2017-05-27 LAB — PROTIME-INR: INR: 2.6 — AB (ref 0.9–1.1)

## 2017-06-08 ENCOUNTER — Ambulatory Visit (INDEPENDENT_AMBULATORY_CARE_PROVIDER_SITE_OTHER): Payer: Medicare Other | Admitting: Physician Assistant

## 2017-06-08 ENCOUNTER — Encounter: Payer: Self-pay | Admitting: Physician Assistant

## 2017-06-08 VITALS — BP 114/58 | HR 72 | Ht 63.0 in | Wt 161.1 lb

## 2017-06-08 DIAGNOSIS — N189 Chronic kidney disease, unspecified: Secondary | ICD-10-CM | POA: Insufficient documentation

## 2017-06-08 DIAGNOSIS — I2510 Atherosclerotic heart disease of native coronary artery without angina pectoris: Secondary | ICD-10-CM

## 2017-06-08 DIAGNOSIS — I5032 Chronic diastolic (congestive) heart failure: Secondary | ICD-10-CM

## 2017-06-08 DIAGNOSIS — N183 Chronic kidney disease, stage 3 unspecified: Secondary | ICD-10-CM

## 2017-06-08 DIAGNOSIS — I482 Chronic atrial fibrillation: Secondary | ICD-10-CM | POA: Diagnosis not present

## 2017-06-08 DIAGNOSIS — D631 Anemia in chronic kidney disease: Secondary | ICD-10-CM | POA: Insufficient documentation

## 2017-06-08 DIAGNOSIS — I4821 Permanent atrial fibrillation: Secondary | ICD-10-CM

## 2017-06-08 NOTE — Patient Instructions (Signed)
Medication Instructions:  Your physician recommends that you continue on your current medications as directed. Please refer to the Current Medication list given to you today.   Labwork: NONE ORDERED TODAY  Testing/Procedures: NONE ORDERED TODAY  Follow-Up: Your physician wants you to follow-up in: 6 MONTHS WITH DR. NAHSER You will receive a reminder letter in the mail two months in advance. If you don't receive a letter, please call our office to schedule the follow-up appointment.   Any Other Special Instructions Will Be Listed Below (If Applicable).     If you need a refill on your cardiac medications before your next appointment, please call your pharmacy.   
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
## 2017-06-15 DIAGNOSIS — D1801 Hemangioma of skin and subcutaneous tissue: Secondary | ICD-10-CM | POA: Diagnosis not present

## 2017-06-15 DIAGNOSIS — L821 Other seborrheic keratosis: Secondary | ICD-10-CM | POA: Diagnosis not present

## 2017-06-15 DIAGNOSIS — L570 Actinic keratosis: Secondary | ICD-10-CM | POA: Diagnosis not present

## 2017-06-23 DIAGNOSIS — H04123 Dry eye syndrome of bilateral lacrimal glands: Secondary | ICD-10-CM | POA: Diagnosis not present

## 2017-06-23 DIAGNOSIS — H35373 Puckering of macula, bilateral: Secondary | ICD-10-CM | POA: Diagnosis not present

## 2017-06-23 DIAGNOSIS — H52203 Unspecified astigmatism, bilateral: Secondary | ICD-10-CM | POA: Diagnosis not present

## 2017-06-23 DIAGNOSIS — H43813 Vitreous degeneration, bilateral: Secondary | ICD-10-CM | POA: Diagnosis not present

## 2017-07-08 ENCOUNTER — Ambulatory Visit (INDEPENDENT_AMBULATORY_CARE_PROVIDER_SITE_OTHER): Payer: Medicare Other | Admitting: Cardiovascular Disease

## 2017-07-08 DIAGNOSIS — Z5181 Encounter for therapeutic drug level monitoring: Secondary | ICD-10-CM

## 2017-07-08 DIAGNOSIS — I4891 Unspecified atrial fibrillation: Secondary | ICD-10-CM | POA: Diagnosis not present

## 2017-07-08 DIAGNOSIS — I4821 Permanent atrial fibrillation: Secondary | ICD-10-CM

## 2017-07-08 DIAGNOSIS — Z7901 Long term (current) use of anticoagulants: Secondary | ICD-10-CM | POA: Diagnosis not present

## 2017-07-08 LAB — PROTIME-INR: INR: 2.4 — AB (ref 0.9–1.1)

## 2017-08-19 ENCOUNTER — Ambulatory Visit (INDEPENDENT_AMBULATORY_CARE_PROVIDER_SITE_OTHER): Payer: Medicare Other | Admitting: Cardiovascular Disease

## 2017-08-19 DIAGNOSIS — Z5181 Encounter for therapeutic drug level monitoring: Secondary | ICD-10-CM | POA: Diagnosis not present

## 2017-08-19 DIAGNOSIS — I482 Chronic atrial fibrillation: Secondary | ICD-10-CM | POA: Diagnosis not present

## 2017-08-19 DIAGNOSIS — I4891 Unspecified atrial fibrillation: Secondary | ICD-10-CM | POA: Diagnosis not present

## 2017-08-19 DIAGNOSIS — I4821 Permanent atrial fibrillation: Secondary | ICD-10-CM

## 2017-08-19 DIAGNOSIS — Z7901 Long term (current) use of anticoagulants: Secondary | ICD-10-CM | POA: Diagnosis not present

## 2017-08-19 LAB — PROTIME-INR: INR: 2 — AB (ref 0.9–1.1)

## 2017-08-19 NOTE — Patient Instructions (Signed)
Description   Spoke with patient and advised to continue same dose 1/2 tablet (1.25 mg) daily except 1 tablet (2.5mg) on Thursdays.  Recheck in 6 weeks.  Orders faxed to [facility].      

## 2017-09-30 ENCOUNTER — Ambulatory Visit (INDEPENDENT_AMBULATORY_CARE_PROVIDER_SITE_OTHER): Payer: Medicare Other | Admitting: Cardiology

## 2017-09-30 DIAGNOSIS — I482 Chronic atrial fibrillation: Secondary | ICD-10-CM | POA: Diagnosis not present

## 2017-09-30 DIAGNOSIS — I4891 Unspecified atrial fibrillation: Secondary | ICD-10-CM | POA: Diagnosis not present

## 2017-09-30 DIAGNOSIS — Z5181 Encounter for therapeutic drug level monitoring: Secondary | ICD-10-CM

## 2017-09-30 DIAGNOSIS — Z7901 Long term (current) use of anticoagulants: Secondary | ICD-10-CM | POA: Diagnosis not present

## 2017-09-30 DIAGNOSIS — I4821 Permanent atrial fibrillation: Secondary | ICD-10-CM

## 2017-09-30 LAB — PROTIME-INR: INR: 2.1 — AB (ref 0.9–1.1)

## 2017-10-04 ENCOUNTER — Other Ambulatory Visit: Payer: Self-pay | Admitting: Cardiovascular Disease

## 2017-10-06 DIAGNOSIS — I10 Essential (primary) hypertension: Secondary | ICD-10-CM | POA: Diagnosis not present

## 2017-10-06 DIAGNOSIS — E785 Hyperlipidemia, unspecified: Secondary | ICD-10-CM | POA: Diagnosis not present

## 2017-10-06 DIAGNOSIS — N184 Chronic kidney disease, stage 4 (severe): Secondary | ICD-10-CM | POA: Diagnosis not present

## 2017-10-06 DIAGNOSIS — E559 Vitamin D deficiency, unspecified: Secondary | ICD-10-CM | POA: Diagnosis not present

## 2017-10-06 DIAGNOSIS — Z0000 Encounter for general adult medical examination without abnormal findings: Secondary | ICD-10-CM | POA: Diagnosis not present

## 2017-10-06 LAB — CBC
HCT: 37.1
HGB: 11.7
MCV: 95.1
WBC: 6.7
platelet count: 154

## 2017-10-06 LAB — COMPLETE METABOLIC PANEL WITH GFR
ALT: 19
AST: 19
Albumin: 3.7
Alkaline Phosphatase: 71
BUN: 45 — AB (ref 4–21)
CREATININE: 2.2
Calcium: 8.9
Glucose: 92
POTASSIUM: 3.9
Sodium: 143
Total Bilirubin: 0.5
Total Protein: 7.2 g/dL

## 2017-10-06 LAB — LIPID PANEL
Cholesterol, Total: 150
HDL: 57
LDL CALC: 78
Triglycerides: 74

## 2017-10-06 LAB — VITAMIN D 25 HYDROXY (VIT D DEFICIENCY, FRACTURES): VIT D 25 HYDROXY: 76

## 2017-10-06 LAB — MAGNESIUM: Magnesium: 2.5

## 2017-10-25 DIAGNOSIS — D638 Anemia in other chronic diseases classified elsewhere: Secondary | ICD-10-CM | POA: Diagnosis not present

## 2017-10-25 DIAGNOSIS — I4891 Unspecified atrial fibrillation: Secondary | ICD-10-CM | POA: Diagnosis not present

## 2017-10-25 DIAGNOSIS — I270 Primary pulmonary hypertension: Secondary | ICD-10-CM | POA: Diagnosis not present

## 2017-10-25 DIAGNOSIS — E039 Hypothyroidism, unspecified: Secondary | ICD-10-CM | POA: Diagnosis not present

## 2017-10-25 DIAGNOSIS — I129 Hypertensive chronic kidney disease with stage 1 through stage 4 chronic kidney disease, or unspecified chronic kidney disease: Secondary | ICD-10-CM | POA: Diagnosis not present

## 2017-10-25 DIAGNOSIS — K219 Gastro-esophageal reflux disease without esophagitis: Secondary | ICD-10-CM | POA: Diagnosis not present

## 2017-10-25 DIAGNOSIS — E785 Hyperlipidemia, unspecified: Secondary | ICD-10-CM | POA: Diagnosis not present

## 2017-10-25 DIAGNOSIS — N184 Chronic kidney disease, stage 4 (severe): Secondary | ICD-10-CM | POA: Diagnosis not present

## 2017-10-25 DIAGNOSIS — F3340 Major depressive disorder, recurrent, in remission, unspecified: Secondary | ICD-10-CM | POA: Diagnosis not present

## 2017-10-25 DIAGNOSIS — J8410 Pulmonary fibrosis, unspecified: Secondary | ICD-10-CM | POA: Diagnosis not present

## 2017-10-25 DIAGNOSIS — I2510 Atherosclerotic heart disease of native coronary artery without angina pectoris: Secondary | ICD-10-CM | POA: Diagnosis not present

## 2017-11-11 ENCOUNTER — Ambulatory Visit (INDEPENDENT_AMBULATORY_CARE_PROVIDER_SITE_OTHER): Payer: Medicare Other | Admitting: Interventional Cardiology

## 2017-11-11 DIAGNOSIS — Z5181 Encounter for therapeutic drug level monitoring: Secondary | ICD-10-CM | POA: Diagnosis not present

## 2017-11-11 DIAGNOSIS — I482 Chronic atrial fibrillation: Secondary | ICD-10-CM

## 2017-11-11 DIAGNOSIS — I4891 Unspecified atrial fibrillation: Secondary | ICD-10-CM | POA: Diagnosis not present

## 2017-11-11 DIAGNOSIS — I4821 Permanent atrial fibrillation: Secondary | ICD-10-CM

## 2017-11-11 LAB — PROTIME-INR: INR: 1.6 — AB (ref 0.9–1.1)

## 2017-11-11 NOTE — Patient Instructions (Signed)
Description   Spoke with patient and advised to take coumadin 1 and 1/2 tablets (3,75mg ) tonight April 4th then continue same dose 1/2 tablet (1.25 mg) daily except 1 tablet (2.5mg ) on Thursdays.  Recheck in 2 weeks.  Orders faxed to [facility].

## 2017-11-25 ENCOUNTER — Ambulatory Visit (INDEPENDENT_AMBULATORY_CARE_PROVIDER_SITE_OTHER): Payer: Medicare Other | Admitting: Pharmacist

## 2017-11-25 DIAGNOSIS — Z5181 Encounter for therapeutic drug level monitoring: Secondary | ICD-10-CM

## 2017-11-25 DIAGNOSIS — I4821 Permanent atrial fibrillation: Secondary | ICD-10-CM

## 2017-11-25 DIAGNOSIS — Z7901 Long term (current) use of anticoagulants: Secondary | ICD-10-CM | POA: Diagnosis not present

## 2017-11-25 DIAGNOSIS — I482 Chronic atrial fibrillation: Secondary | ICD-10-CM | POA: Diagnosis not present

## 2017-11-25 DIAGNOSIS — I4891 Unspecified atrial fibrillation: Secondary | ICD-10-CM | POA: Diagnosis not present

## 2017-11-25 LAB — POCT INR: INR: 2.1

## 2017-11-25 NOTE — Patient Instructions (Signed)
Description   Spoke with patient and advised to continue same dose 1/2 tablet (1.25 mg) daily except 1 tablet (2.5mg ) on Thursdays.  Recheck in 3 weeks.  Orders faxed to [facility].

## 2017-11-29 ENCOUNTER — Encounter: Payer: Self-pay | Admitting: Internal Medicine

## 2017-11-29 ENCOUNTER — Non-Acute Institutional Stay: Payer: Medicare Other | Admitting: Internal Medicine

## 2017-11-29 VITALS — BP 120/64 | HR 82 | Temp 97.6°F | Resp 16 | Ht 63.0 in | Wt 159.0 lb

## 2017-11-29 DIAGNOSIS — Z8719 Personal history of other diseases of the digestive system: Secondary | ICD-10-CM | POA: Diagnosis not present

## 2017-11-29 DIAGNOSIS — F411 Generalized anxiety disorder: Secondary | ICD-10-CM | POA: Insufficient documentation

## 2017-11-29 DIAGNOSIS — E039 Hypothyroidism, unspecified: Secondary | ICD-10-CM | POA: Insufficient documentation

## 2017-11-29 DIAGNOSIS — I482 Chronic atrial fibrillation: Secondary | ICD-10-CM | POA: Diagnosis not present

## 2017-11-29 DIAGNOSIS — K219 Gastro-esophageal reflux disease without esophagitis: Secondary | ICD-10-CM | POA: Diagnosis not present

## 2017-11-29 DIAGNOSIS — N183 Chronic kidney disease, stage 3 unspecified: Secondary | ICD-10-CM

## 2017-11-29 DIAGNOSIS — R609 Edema, unspecified: Secondary | ICD-10-CM | POA: Diagnosis not present

## 2017-11-29 DIAGNOSIS — I2510 Atherosclerotic heart disease of native coronary artery without angina pectoris: Secondary | ICD-10-CM

## 2017-11-29 DIAGNOSIS — I4821 Permanent atrial fibrillation: Secondary | ICD-10-CM
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
## 2017-12-03 ENCOUNTER — Encounter: Payer: Self-pay | Admitting: *Deleted

## 2017-12-06 ENCOUNTER — Encounter: Payer: Self-pay | Admitting: *Deleted

## 2017-12-16 ENCOUNTER — Ambulatory Visit (INDEPENDENT_AMBULATORY_CARE_PROVIDER_SITE_OTHER): Payer: Medicare Other | Admitting: Cardiology

## 2017-12-16 DIAGNOSIS — Z7901 Long term (current) use of anticoagulants: Secondary | ICD-10-CM | POA: Diagnosis not present

## 2017-12-16 DIAGNOSIS — D649 Anemia, unspecified: Secondary | ICD-10-CM | POA: Diagnosis not present

## 2017-12-16 DIAGNOSIS — Z5181 Encounter for therapeutic drug level monitoring: Secondary | ICD-10-CM

## 2017-12-16 DIAGNOSIS — N184 Chronic kidney disease, stage 4 (severe): Secondary | ICD-10-CM | POA: Diagnosis not present

## 2017-12-16 DIAGNOSIS — I482 Chronic atrial fibrillation: Secondary | ICD-10-CM | POA: Diagnosis not present

## 2017-12-16 DIAGNOSIS — I129 Hypertensive chronic kidney disease with stage 1 through stage 4 chronic kidney disease, or unspecified chronic kidney disease: Secondary | ICD-10-CM | POA: Diagnosis not present

## 2017-12-16 DIAGNOSIS — I4891 Unspecified atrial fibrillation: Secondary | ICD-10-CM | POA: Diagnosis not present

## 2017-12-16 DIAGNOSIS — I4821 Permanent atrial fibrillation: Secondary | ICD-10-CM

## 2017-12-16 LAB — PROTIME-INR: INR: 1.9 — AB (ref 0.9–1.1)

## 2017-12-27 ENCOUNTER — Other Ambulatory Visit: Payer: Self-pay | Admitting: *Deleted

## 2017-12-27 MED ORDER — METOPROLOL SUCCINATE ER 25 MG PO TB24: 25.0000 mg | ORAL_TABLET | Freq: Every day | ORAL | 1 refills | Status: DC

## 2017-12-30 ENCOUNTER — Ambulatory Visit (INDEPENDENT_AMBULATORY_CARE_PROVIDER_SITE_OTHER): Payer: Medicare Other | Admitting: Interventional Cardiology

## 2017-12-30 DIAGNOSIS — Z7901 Long term (current) use of anticoagulants: Secondary | ICD-10-CM | POA: Diagnosis not present

## 2017-12-30 DIAGNOSIS — I4821 Permanent atrial fibrillation: Secondary | ICD-10-CM

## 2017-12-30 DIAGNOSIS — I482 Chronic atrial fibrillation: Secondary | ICD-10-CM

## 2017-12-30 DIAGNOSIS — Z5181 Encounter for therapeutic drug level monitoring: Secondary | ICD-10-CM | POA: Diagnosis not present

## 2017-12-30 DIAGNOSIS — I4891 Unspecified atrial fibrillation: Secondary | ICD-10-CM | POA: Diagnosis not present

## 2017-12-30 LAB — PROTIME-INR: INR: 2.2 — AB (ref 0.9–1.1)

## 2018-01-20 ENCOUNTER — Ambulatory Visit (INDEPENDENT_AMBULATORY_CARE_PROVIDER_SITE_OTHER): Payer: Medicare Other | Admitting: Internal Medicine

## 2018-01-20 DIAGNOSIS — Z7901 Long term (current) use of anticoagulants: Secondary | ICD-10-CM | POA: Diagnosis not present

## 2018-01-20 DIAGNOSIS — I482 Chronic atrial fibrillation: Secondary | ICD-10-CM | POA: Diagnosis not present

## 2018-01-20 DIAGNOSIS — I4891 Unspecified atrial fibrillation: Secondary | ICD-10-CM | POA: Diagnosis not present

## 2018-01-20 DIAGNOSIS — Z5181 Encounter for therapeutic drug level monitoring: Secondary | ICD-10-CM | POA: Diagnosis not present

## 2018-01-20 DIAGNOSIS — I4821 Permanent atrial fibrillation: Secondary | ICD-10-CM

## 2018-01-20 LAB — PROTIME-INR: INR: 2.3 — AB (ref 0.9–1.1)

## 2018-02-16 DIAGNOSIS — D1801 Hemangioma of skin and subcutaneous tissue: Secondary | ICD-10-CM | POA: Diagnosis not present

## 2018-02-16 DIAGNOSIS — L570 Actinic keratosis: Secondary | ICD-10-CM | POA: Diagnosis not present

## 2018-02-16 DIAGNOSIS — R202 Paresthesia of skin: Secondary | ICD-10-CM | POA: Diagnosis not present

## 2018-02-16 DIAGNOSIS — L821 Other seborrheic keratosis: Secondary | ICD-10-CM | POA: Diagnosis not present

## 2018-02-17 ENCOUNTER — Ambulatory Visit (INDEPENDENT_AMBULATORY_CARE_PROVIDER_SITE_OTHER): Payer: Medicare Other | Admitting: Internal Medicine

## 2018-02-17 DIAGNOSIS — Z5181 Encounter for therapeutic drug level monitoring: Secondary | ICD-10-CM

## 2018-02-17 DIAGNOSIS — I4821 Permanent atrial fibrillation: Secondary | ICD-10-CM

## 2018-02-17 DIAGNOSIS — I482 Chronic atrial fibrillation: Secondary | ICD-10-CM | POA: Diagnosis not present

## 2018-02-17 DIAGNOSIS — I4891 Unspecified atrial fibrillation: Secondary | ICD-10-CM | POA: Diagnosis not present

## 2018-02-17 DIAGNOSIS — Z7901 Long term (current) use of anticoagulants: Secondary | ICD-10-CM | POA: Diagnosis not present

## 2018-02-17 LAB — PROTIME-INR: INR: 2 — AB (ref 0.9–1.1)

## 2018-02-28 ENCOUNTER — Non-Acute Institutional Stay: Payer: Medicare Other | Admitting: Internal Medicine

## 2018-02-28 ENCOUNTER — Encounter: Payer: Self-pay | Admitting: Internal Medicine

## 2018-02-28 VITALS — BP 118/60 | HR 90 | Temp 97.5°F | Resp 16 | Ht 63.0 in | Wt 162.2 lb

## 2018-02-28 DIAGNOSIS — F411 Generalized anxiety disorder: Secondary | ICD-10-CM

## 2018-02-28 DIAGNOSIS — H6121 Impacted cerumen, right ear: Secondary | ICD-10-CM | POA: Diagnosis not present

## 2018-02-28 DIAGNOSIS — I482 Chronic atrial fibrillation: Secondary | ICD-10-CM | POA: Diagnosis not present

## 2018-02-28 DIAGNOSIS — I4821 Permanent atrial fibrillation: Secondary | ICD-10-CM

## 2018-02-28 DIAGNOSIS — I2510 Atherosclerotic heart disease of native coronary artery without angina pectoris: Secondary | ICD-10-CM

## 2018-02-28 DIAGNOSIS — Z8719 Personal history of other diseases of the digestive system: Secondary | ICD-10-CM | POA: Diagnosis not present

## 2018-02-28 DIAGNOSIS — N183 Chronic kidney disease, stage 3 unspecified: Secondary | ICD-10-CM

## 2018-02-28 DIAGNOSIS — I5032 Chronic diastolic (congestive) heart failure: Secondary | ICD-10-CM

## 2018-02-28 DIAGNOSIS — E039 Hypothyroidism, unspecified: Secondary | ICD-10-CM | POA: Diagnosis not present

## 2018-02-28 MED ORDER — CARBAMIDE PEROXIDE 6.5 % OT SOLN: OTIC | 0 refills | Status: DC

## 2018-02-28 NOTE — Patient Instructions (Signed)
Place ear drop to right ear twice a day for 1 week and then come downstairs for ear lavage.
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
## 2018-03-01 ENCOUNTER — Telehealth: Payer: Self-pay

## 2018-03-01 NOTE — Telephone Encounter (Signed)
Patient called to say that he was supposed to receive a call about scheduling an INR check. Please call patient to confirm if INR is needed. Patient seems to be confused about appointment yesterday.

## 2018-03-01 NOTE — Telephone Encounter (Signed)
Left a message on the answer machine for the patient to call back to discuss his concerns.

## 2018-03-03 ENCOUNTER — Ambulatory Visit (INDEPENDENT_AMBULATORY_CARE_PROVIDER_SITE_OTHER): Payer: Medicare Other | Admitting: Interventional Cardiology

## 2018-03-03 DIAGNOSIS — I482 Chronic atrial fibrillation: Secondary | ICD-10-CM | POA: Diagnosis not present

## 2018-03-03 DIAGNOSIS — Z5181 Encounter for therapeutic drug level monitoring: Secondary | ICD-10-CM

## 2018-03-03 DIAGNOSIS — I509 Heart failure, unspecified: Secondary | ICD-10-CM | POA: Diagnosis not present

## 2018-03-03 DIAGNOSIS — I5033 Acute on chronic diastolic (congestive) heart failure: Secondary | ICD-10-CM | POA: Diagnosis not present

## 2018-03-03 DIAGNOSIS — I4821 Permanent atrial fibrillation: Secondary | ICD-10-CM

## 2018-03-03 DIAGNOSIS — Z7901 Long term (current) use of anticoagulants: Secondary | ICD-10-CM | POA: Diagnosis not present

## 2018-03-03 LAB — POCT INR: INR: 2.4 (ref 2.0–3.0)

## 2018-03-03 NOTE — Patient Instructions (Signed)
Description   Spoke with patient and advised to continue same dose 1/2 tablet (1.25 mg) daily except 1 tablet (2.5mg ) on Thursdays.  Recheck in 6 weeks.  Orders faxed to [facility]

## 2018-03-07 ENCOUNTER — Ambulatory Visit: Payer: Medicare Other | Admitting: *Deleted

## 2018-03-07 ENCOUNTER — Encounter: Payer: Self-pay | Admitting: Cardiovascular Disease

## 2018-03-07 ENCOUNTER — Ambulatory Visit (INDEPENDENT_AMBULATORY_CARE_PROVIDER_SITE_OTHER): Payer: Medicare Other | Admitting: Cardiovascular Disease

## 2018-03-07 VITALS — BP 130/64 | HR 90 | Ht 60.0 in | Wt 162.1 lb

## 2018-03-07 DIAGNOSIS — I482 Chronic atrial fibrillation: Secondary | ICD-10-CM | POA: Diagnosis not present

## 2018-03-07 DIAGNOSIS — I2510 Atherosclerotic heart disease of native coronary artery without angina pectoris: Secondary | ICD-10-CM | POA: Diagnosis not present

## 2018-03-07 DIAGNOSIS — H6121 Impacted cerumen, right ear: Secondary | ICD-10-CM

## 2018-03-07 DIAGNOSIS — I4821 Permanent atrial fibrillation: Secondary | ICD-10-CM

## 2018-03-07 NOTE — Patient Instructions (Signed)

## 2018-03-07 NOTE — Progress Notes (Signed)
Impaction cleared with lavage. Tolerated procedure well.
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
## 2018-03-23 ENCOUNTER — Encounter: Payer: Self-pay | Admitting: Internal Medicine

## 2018-03-31 ENCOUNTER — Other Ambulatory Visit: Payer: Self-pay | Admitting: Internal Medicine

## 2018-03-31 MED ORDER — FUROSEMIDE 40 MG PO TABS: 40.0000 mg | ORAL_TABLET | Freq: Every day | ORAL | 3 refills | Status: DC

## 2018-04-04 ENCOUNTER — Other Ambulatory Visit: Payer: Self-pay | Admitting: *Deleted

## 2018-04-04 ENCOUNTER — Telehealth: Payer: Self-pay | Admitting: *Deleted

## 2018-04-04 MED ORDER — FUROSEMIDE 40 MG PO TABS: ORAL_TABLET | ORAL | 1 refills | Status: DC

## 2018-04-04 NOTE — Telephone Encounter (Signed)
Received fax from [facility] [phone] Fax: [phone] needing clarification on a medication. Stated that they received a eRx for Furosemide 40mg  with conflicting directions. Wants to know if it should be 1 daily or 2 tablets in the morning and 1 at noon. Please Clarify and send new Rx to [facility].

## 2018-04-04 NOTE — Telephone Encounter (Signed)
Confirmed with patient and correct Rx sent to pharmacy.

## 2018-04-07 DIAGNOSIS — I4891 Unspecified atrial fibrillation: Secondary | ICD-10-CM | POA: Diagnosis not present

## 2018-04-07 DIAGNOSIS — I5032 Chronic diastolic (congestive) heart failure: Secondary | ICD-10-CM | POA: Diagnosis not present

## 2018-04-07 DIAGNOSIS — N183 Chronic kidney disease, stage 3 unspecified: Secondary | ICD-10-CM

## 2018-04-07 DIAGNOSIS — F411 Generalized anxiety disorder: Secondary | ICD-10-CM

## 2018-04-07 DIAGNOSIS — E039 Hypothyroidism, unspecified: Secondary | ICD-10-CM

## 2018-04-07 DIAGNOSIS — Z7901 Long term (current) use of anticoagulants: Secondary | ICD-10-CM | POA: Diagnosis not present

## 2018-04-07 LAB — CBC
HCT: 33.5 % — ABNORMAL LOW (ref 38.5–50.0)
HEMOGLOBIN: 11.2 g/dL — AB (ref 13.2–17.1)
MCH: 30.9 pg (ref 27.0–33.0)
MCHC: 33.4 g/dL (ref 32.0–36.0)
MCV: 92.3 fL (ref 80.0–100.0)
MPV: 11.3 fL (ref 7.5–12.5)
Platelets: 178 10*3/uL (ref 140–400)
RBC: 3.63 10*6/uL — ABNORMAL LOW (ref 4.20–5.80)
RDW: 12 % (ref 11.0–15.0)
WBC: 8.8 10*3/uL (ref 3.8–10.8)

## 2018-04-07 LAB — LIPID PANEL
CHOLESTEROL: 124 mg/dL (ref <200)
HDL: 46 mg/dL (ref >40)
LDL CHOLESTEROL (CALC): 64 mg/dL
Non-HDL Cholesterol (Calc): 78 mg/dL (calc) (ref <130)
Total CHOL/HDL Ratio: 2.7 (calc) (ref <5.0)
Triglycerides: 68 mg/dL (ref <150)

## 2018-04-07 LAB — COMPLETE METABOLIC PANEL WITH GFR
AG RATIO: 1.3 (calc) (ref 1.0–2.5)
ALBUMIN MSPROF: 3.9 g/dL (ref 3.6–5.1)
ALT: 9 U/L (ref 9–46)
AST: 14 U/L (ref 10–35)
Alkaline phosphatase (APISO): 66 U/L (ref 40–115)
BILIRUBIN TOTAL: 0.9 mg/dL (ref 0.2–1.2)
BUN/Creatinine Ratio: 20 (calc) (ref 6–22)
BUN: 44 mg/dL — AB (ref 7–25)
CALCIUM: 8.9 mg/dL (ref 8.6–10.3)
CHLORIDE: 101 mmol/L (ref 98–110)
CO2: 31 mmol/L (ref 20–32)
Creat: 2.19 mg/dL — ABNORMAL HIGH (ref 0.70–1.11)
GFR, Est African American: 29 mL/min/{1.73_m2} — ABNORMAL LOW (ref >=60)
GFR, Est Non African American: 25 mL/min/{1.73_m2} — ABNORMAL LOW (ref >=60)
GLOBULIN: 3.1 g/dL (ref 1.9–3.7)
Glucose, Bld: 97 mg/dL (ref 65–99)
POTASSIUM: 3.8 mmol/L (ref 3.5–5.3)
Sodium: 142 mmol/L (ref 135–146)
Total Protein: 7 g/dL (ref 6.1–8.1)

## 2018-04-07 LAB — PROTIME-INR
INR: 1.7 — AB
PROTHROMBIN TIME: 18.1 s — AB (ref 9.0–11.5)

## 2018-04-07 LAB — TSH: TSH: 1.49 m[IU]/L (ref 0.40–4.50)

## 2018-04-08 ENCOUNTER — Ambulatory Visit (INDEPENDENT_AMBULATORY_CARE_PROVIDER_SITE_OTHER): Payer: Medicare Other | Admitting: Internal Medicine

## 2018-04-08 DIAGNOSIS — I4821 Permanent atrial fibrillation: Secondary | ICD-10-CM

## 2018-04-08 DIAGNOSIS — I482 Chronic atrial fibrillation: Secondary | ICD-10-CM

## 2018-04-08 DIAGNOSIS — Z5181 Encounter for therapeutic drug level monitoring: Secondary | ICD-10-CM | POA: Diagnosis not present

## 2018-04-08 NOTE — Patient Instructions (Signed)
Description   Spoke with patient and advised to take 1 tablet today then continue same dose 1/2 tablet (1.25 mg) daily except 1 tablet (2.5mg ) on Thursdays.  Recheck in 4 weeks.  Orders faxed to [facility].

## 2018-04-15 ENCOUNTER — Telehealth: Payer: Self-pay | Admitting: *Deleted

## 2018-04-15 NOTE — Telephone Encounter (Signed)
Pt called and stated he had his root canal done and was placed on Clindamycin 150mg  three times a day for 7 days (has 21 tabs). Advised pt that the med does not interact with Coumadin and is safe to take with Coumadin.

## 2018-04-25 ENCOUNTER — Other Ambulatory Visit: Payer: Self-pay | Admitting: Internal Medicine

## 2018-04-25 MED ORDER — LORATADINE 10 MG PO TABS: 10.0000 mg | ORAL_TABLET | Freq: Every day | ORAL | Status: DC

## 2018-04-25 MED ORDER — FUROSEMIDE 40 MG PO TABS: ORAL_TABLET | ORAL | 3 refills | Status: DC

## 2018-04-25 MED ORDER — DIAZEPAM 5 MG PO TABS: 5.0000 mg | ORAL_TABLET | Freq: Every day | ORAL | 0 refills | Status: DC

## 2018-04-25 MED ORDER — SIMVASTATIN 20 MG PO TABS: 20.0000 mg | ORAL_TABLET | Freq: Every day | ORAL | 3 refills | Status: DC

## 2018-04-25 MED ORDER — LEVOTHYROXINE SODIUM 75 MCG PO TABS: 75.0000 ug | ORAL_TABLET | Freq: Every day | ORAL | 3 refills | Status: DC

## 2018-04-25 MED ORDER — OMEPRAZOLE 20 MG PO CPDR: 20.0000 mg | DELAYED_RELEASE_CAPSULE | Freq: Every day | ORAL | 3 refills | Status: DC

## 2018-04-25 MED ORDER — METOPROLOL SUCCINATE ER 25 MG PO TB24: 25.0000 mg | ORAL_TABLET | Freq: Every day | ORAL | 3 refills | Status: DC

## 2018-04-25 NOTE — Progress Notes (Signed)
Medication refill sent to [facility] per pt request.

## 2018-05-05 ENCOUNTER — Ambulatory Visit (INDEPENDENT_AMBULATORY_CARE_PROVIDER_SITE_OTHER): Payer: Medicare Other | Admitting: Cardiology

## 2018-05-05 DIAGNOSIS — I482 Chronic atrial fibrillation: Secondary | ICD-10-CM

## 2018-05-05 DIAGNOSIS — Z7901 Long term (current) use of anticoagulants: Secondary | ICD-10-CM | POA: Diagnosis not present

## 2018-05-05 DIAGNOSIS — I4891 Unspecified atrial fibrillation: Secondary | ICD-10-CM | POA: Diagnosis not present

## 2018-05-05 DIAGNOSIS — I4821 Permanent atrial fibrillation: Secondary | ICD-10-CM

## 2018-05-05 DIAGNOSIS — Z5181 Encounter for therapeutic drug level monitoring: Secondary | ICD-10-CM | POA: Diagnosis not present

## 2018-05-05 LAB — POCT INR: INR: 1.9 — AB (ref 2.0–3.0)

## 2018-05-06 NOTE — Patient Instructions (Signed)
Description   Spoke with patient and advised to take 1 tablet today then continue same dose 1/2 tablet (1.25 mg) daily except 1 tablet (2.5mg ) on Thursdays.  Recheck in 3 weeks. Orders faxed to [facility].

## 2018-05-31 ENCOUNTER — Non-Acute Institutional Stay: Payer: Medicare Other | Admitting: Family

## 2018-05-31 ENCOUNTER — Encounter: Payer: Self-pay | Admitting: Family

## 2018-05-31 VITALS — BP 120/72 | HR 71 | Temp 97.8°F | Resp 18 | Ht 60.0 in | Wt 158.2 lb

## 2018-05-31 DIAGNOSIS — I4821 Permanent atrial fibrillation: Secondary | ICD-10-CM

## 2018-05-31 DIAGNOSIS — N183 Chronic kidney disease, stage 3 unspecified: Secondary | ICD-10-CM

## 2018-05-31 DIAGNOSIS — E039 Hypothyroidism, unspecified: Secondary | ICD-10-CM | POA: Diagnosis not present

## 2018-05-31 DIAGNOSIS — I5032 Chronic diastolic (congestive) heart failure: Secondary | ICD-10-CM

## 2018-05-31 DIAGNOSIS — Z23 Encounter for immunization: Secondary | ICD-10-CM | POA: Diagnosis not present

## 2018-05-31 DIAGNOSIS — R609 Edema, unspecified: Secondary | ICD-10-CM | POA: Diagnosis not present

## 2018-05-31 DIAGNOSIS — I2510 Atherosclerotic heart disease of native coronary artery without angina pectoris: Secondary | ICD-10-CM

## 2018-05-31 DIAGNOSIS — E782 Mixed hyperlipidemia: Secondary | ICD-10-CM

## 2018-05-31 MED ORDER — PNEUMOCOCCAL 13-VAL CONJ VACC IM SUSP: 0.5000 mL | Freq: Once | INTRAMUSCULAR | 0 refills | Status: AC

## 2018-05-31 MED ORDER — SIMVASTATIN 10 MG PO TABS: 10.0000 mg | ORAL_TABLET | Freq: Every day | ORAL | 3 refills | Status: DC
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
## 2018-05-31 NOTE — Patient Instructions (Addendum)
1. Reduce Simvastatin 20 mg tablet to 10 mg tablet daily 2. Follow up in 3 months please get fasting lab work drawn one week prior to follow up visit. 3. Below is information for food to avoid while taking coumadin    Vitamin K Foods and Warfarin Warfarin is a blood thinner (anticoagulant). Anticoagulant medicines help prevent the formation of blood clots. These medicines work by decreasing the activity of vitamin K, which promotes normal blood clotting. When you take warfarin, problems can occur from suddenly increasing or decreasing the amount of vitamin K that you eat from one day to the next. Problems may include:  Blood clots.  Bleeding.  What general guidelines do I need to follow? To avoid problems when taking warfarin:  Eat a balanced diet that includes: ? Fresh fruits and vegetables. ? Whole grains. ? Low-fat dairy products. ? Lean proteins, such as fish, eggs, and lean cuts of meat.  Keep your intake of vitamin K consistent from day to day. To do this: ? Avoid eating large amounts of vitamin K one day and low amounts of vitamin K the next day. ? If you take a multivitamin that contains vitamin K, be sure to take it every day. ? Know which foods contain vitamin K. Use the lists below to understand serving sizes and the amount of vitamin K in one serving.  Avoid major changes in your diet. If you are going to change your diet, talk with your health care provider before making changes.  Work with a Financial planner (dietitian) to develop a meal plan that works best for you.  High vitamin K foods Foods that are high in vitamin K contain more than 100 mcg (micrograms) per serving. These include:  Broccoli (cooked) -  cup has 110 mcg.  Brussels sprouts (cooked) -  cup has 109 mcg.  Greens, beet (cooked) -  cup has 350 mcg.  Greens, collard (cooked) -  cup has 418 mcg.  Greens, turnip (cooked) -  cup has 265 mcg.  Green onions or scallions -  cup has 105  mcg.  Kale (fresh or frozen) -  cup has 531 mcg.  Parsley (raw) - 10 sprigs has 164 mcg.  Spinach (cooked) -  cup has 444 mcg.  Swiss chard (cooked) -  cup has 287 mcg.  Moderate vitamin K foods Foods that have a moderate amount of vitamin K contain 25-100 mcg per serving. These include:  Asparagus (cooked) - 5 spears have 38 mcg.  Black-eyed peas (dried) -  cup has 32 mcg.  Cabbage (cooked) -  cup has 37 mcg.  Kiwi fruit - 1 medium has 31 mcg.  Lettuce - 1 cup has 57-63 mcg.  Okra (frozen) -  cup has 44 mcg.  Prunes (dried) - 5 prunes have 25 mcg.  Watercress (raw) - 1 cup has 85 mcg.  Low vitamin K foods Foods low in vitamin K contain less than 25 mcg per serving. These include:  Artichoke - 1 medium has 18 mcg.  Avocado - 1 oz. has 6 mcg.  Blueberries -  cup has 14 mcg.  Cabbage (raw) -  cup has 21 mcg.  Carrots (cooked) -  cup has 11 mcg.  Cauliflower (raw) -  cup has 11 mcg.  Cucumber with peel (raw) -  cup has 9 mcg.  Grapes -  cup has 12 mcg.  Mango - 1 medium has 9 mcg.  Nuts - 1 oz. has 15 mcg.  Pear - 1 medium  has 8 mcg.  Peas (cooked) -  cup has 19 mcg.  Pickles - 1 spear has 14 mcg.  Pumpkin seeds - 1 oz. has 13 mcg.  Sauerkraut (canned) -  cup has 16 mcg.  Soybeans (cooked) -  cup has 16 mcg.  Tomato (raw) - 1 medium has 10 mcg.  Tomato sauce -  cup has 17 mcg.  Vitamin K-free foods If a food contain less than 5 mcg per serving, it is considered to have no vitamin K. These foods include:  Bread and cereal products.  Cheese.  Eggs.  Fish and shellfish.  Meat and poultry.  Milk and dairy products.  Sunflower seeds.  Actual amounts of vitamin K in foods may be different depending on processing. Talk with your dietitian about what foods you can eat and what foods you should avoid. This information is not intended to replace advice given to you by your health care provider. Make sure you discuss any  questions you have with your health care provider. Document Released: 05/24/2009 Document Revised: 02/16/2016 Document Reviewed: 10/30/2015 Elsevier Interactive Patient Education  [employer].

## 2018-06-02 ENCOUNTER — Ambulatory Visit (INDEPENDENT_AMBULATORY_CARE_PROVIDER_SITE_OTHER): Payer: Medicare Other | Admitting: Cardiovascular Disease

## 2018-06-02 DIAGNOSIS — I4821 Permanent atrial fibrillation: Secondary | ICD-10-CM

## 2018-06-02 DIAGNOSIS — I4891 Unspecified atrial fibrillation: Secondary | ICD-10-CM | POA: Diagnosis not present

## 2018-06-02 DIAGNOSIS — Z5181 Encounter for therapeutic drug level monitoring: Secondary | ICD-10-CM

## 2018-06-02 DIAGNOSIS — Z7901 Long term (current) use of anticoagulants: Secondary | ICD-10-CM | POA: Diagnosis not present

## 2018-06-02 LAB — PROTIME-INR: INR: 2.6 — AB (ref 0.9–1.1)

## 2018-06-06 ENCOUNTER — Telehealth: Payer: Self-pay | Admitting: *Deleted

## 2018-06-06 MED ORDER — PNEUMOCOCCAL 13-VAL CONJ VACC IM SUSP: 0.5000 mL | Freq: Once | INTRAMUSCULAR | 0 refills | Status: AC

## 2018-06-06 NOTE — Telephone Encounter (Signed)
Patient called and stated that he was suppose to have a Rx sent to the Pharmacy for him to get the Pneumonia injection.  I reviewed last OV 10/22 note and Prevnar 13 was to be sent to pharmacy for them to administer.  Faxed Rx to pharmacy.

## 2018-06-16 ENCOUNTER — Ambulatory Visit (INDEPENDENT_AMBULATORY_CARE_PROVIDER_SITE_OTHER): Payer: Medicare Other

## 2018-06-16 DIAGNOSIS — Z5181 Encounter for therapeutic drug level monitoring: Secondary | ICD-10-CM | POA: Diagnosis not present

## 2018-06-16 DIAGNOSIS — I4821 Permanent atrial fibrillation: Secondary | ICD-10-CM

## 2018-06-16 DIAGNOSIS — I4891 Unspecified atrial fibrillation: Secondary | ICD-10-CM | POA: Diagnosis not present

## 2018-06-16 DIAGNOSIS — Z7901 Long term (current) use of anticoagulants: Secondary | ICD-10-CM | POA: Diagnosis not present

## 2018-06-16 LAB — PROTIME-INR: INR: 1.8 — AB (ref 0.9–1.1)

## 2018-06-16 NOTE — Patient Instructions (Signed)
Description   Spoke with patient and advised to take 1 tablet today and tomorrow, then resume same dosage 1/2 tablet (1.25 mg) daily except 1 tablet (2.5mg ) on Thursdays.  Recheck in 2 weeks. Orders faxed to [facility].

## 2018-06-18 DIAGNOSIS — Z23 Encounter for immunization: Secondary | ICD-10-CM | POA: Diagnosis not present

## 2018-06-21 ENCOUNTER — Telehealth: Payer: Self-pay | Admitting: *Deleted

## 2018-06-21 NOTE — Telephone Encounter (Signed)
Received fax from [facility] requesting a refill on Diazepam. Patient is getting Refills from Dr. Deland Pretty. LR: 06/06/2018.

## 2018-06-21 NOTE — Telephone Encounter (Signed)
NCCSRS Verified.

## 2018-06-21 NOTE — Telephone Encounter (Signed)
Please verify with patient if he wants to continue to receive diazepam refills from [facility] or to be refilled by [facility] provider.

## 2018-06-21 NOTE — Telephone Encounter (Addendum)
Spoke with patient to verify if which provider he would preferred to refill medication for Diazepam. Patient stated that he would like [facility] provider to refill diazepam and thought everything was transferred over to [facility] already. Patient had no further questions or concerns.

## 2018-06-27 ENCOUNTER — Other Ambulatory Visit: Payer: Self-pay

## 2018-06-27 MED ORDER — DIAZEPAM 5 MG PO TABS: 5.0000 mg | ORAL_TABLET | Freq: Every day | ORAL | 3 refills | Status: DC

## 2018-06-27 MED ORDER — DIAZEPAM 5 MG PO TABS: 5.0000 mg | ORAL_TABLET | Freq: Every day | ORAL | 0 refills | Status: DC

## 2018-06-27 NOTE — Telephone Encounter (Signed)
Patient called and said he needed refills for Diazepam 5 mg tab. He stated that he had wanted [facility] to be his PCP and would like to get all medication refills through this office. I explained to him he would have to contact the office that prescribed this medication. He said he no longer sees this provider.   Spoke with Dinah the NP about patients need for refill. She stated as long as he is not seeing another provider still at this office. She would be ok with refilling this medication for him. Told patient that NP is willing to refill this medication as long as he is no longer seeing another provider for this refill. He verbalized understanding and denied seeing another provider for refills. Stated he is only interested in getting his refills through [facility].    Sent medication to patient's online pharmacy.

## 2018-06-30 DIAGNOSIS — Z7901 Long term (current) use of anticoagulants: Secondary | ICD-10-CM | POA: Diagnosis not present

## 2018-06-30 DIAGNOSIS — I4891 Unspecified atrial fibrillation: Secondary | ICD-10-CM | POA: Diagnosis not present

## 2018-06-30 LAB — PROTIME-INR: INR: 2.1 — AB (ref 0.9–1.1)

## 2018-07-01 ENCOUNTER — Telehealth: Payer: Self-pay

## 2018-07-01 ENCOUNTER — Ambulatory Visit (INDEPENDENT_AMBULATORY_CARE_PROVIDER_SITE_OTHER): Payer: Medicare Other | Admitting: Cardiovascular Disease

## 2018-07-01 DIAGNOSIS — I4821 Permanent atrial fibrillation: Secondary | ICD-10-CM

## 2018-07-01 DIAGNOSIS — Z5181 Encounter for therapeutic drug level monitoring: Secondary | ICD-10-CM

## 2018-07-01 NOTE — Telephone Encounter (Signed)
Opened in error

## 2018-07-06 DIAGNOSIS — H43813 Vitreous degeneration, bilateral: Secondary | ICD-10-CM | POA: Diagnosis not present

## 2018-07-06 DIAGNOSIS — H35373 Puckering of macula, bilateral: Secondary | ICD-10-CM | POA: Diagnosis not present

## 2018-07-06 DIAGNOSIS — H52203 Unspecified astigmatism, bilateral: Secondary | ICD-10-CM | POA: Diagnosis not present

## 2018-07-06 DIAGNOSIS — H53001 Unspecified amblyopia, right eye: Secondary | ICD-10-CM | POA: Diagnosis not present

## 2018-07-20 ENCOUNTER — Other Ambulatory Visit: Payer: Self-pay | Admitting: Cardiovascular Disease

## 2018-07-24 ENCOUNTER — Other Ambulatory Visit: Payer: Self-pay | Admitting: Family

## 2018-07-25 NOTE — Telephone Encounter (Signed)
Last OV 02/28/2018 No access to data base  Last refill 06/27/2018

## 2018-07-28 DIAGNOSIS — I4891 Unspecified atrial fibrillation: Secondary | ICD-10-CM | POA: Diagnosis not present

## 2018-07-28 LAB — PROTIME-INR: INR: 2.2 — AB (ref 0.9–1.1)

## 2018-07-29 ENCOUNTER — Ambulatory Visit (INDEPENDENT_AMBULATORY_CARE_PROVIDER_SITE_OTHER): Payer: Medicare Other

## 2018-07-29 DIAGNOSIS — Z5181 Encounter for therapeutic drug level monitoring: Secondary | ICD-10-CM | POA: Diagnosis not present

## 2018-07-29 DIAGNOSIS — I4821 Permanent atrial fibrillation: Secondary | ICD-10-CM | POA: Diagnosis not present

## 2018-07-29 NOTE — Patient Instructions (Signed)
Description   Spoke with patient and advised to continue taking your same dosage 1/2 tablet (1.25 mg) daily except 1 tablet (2.5mg ) on Thursdays.  Recheck in 4 weeks. Orders faxed to [facility].

## 2018-08-18 ENCOUNTER — Other Ambulatory Visit: Payer: Self-pay | Admitting: Nurse Practitioner

## 2018-08-18 DIAGNOSIS — E039 Hypothyroidism, unspecified: Secondary | ICD-10-CM

## 2018-08-18 DIAGNOSIS — E782 Mixed hyperlipidemia: Secondary | ICD-10-CM

## 2018-08-18 DIAGNOSIS — I5032 Chronic diastolic (congestive) heart failure: Secondary | ICD-10-CM

## 2018-08-18 DIAGNOSIS — N183 Chronic kidney disease, stage 3 unspecified: Secondary | ICD-10-CM

## 2018-08-22 DIAGNOSIS — N183 Chronic kidney disease, stage 3 unspecified: Secondary | ICD-10-CM

## 2018-08-22 DIAGNOSIS — E039 Hypothyroidism, unspecified: Secondary | ICD-10-CM

## 2018-08-22 DIAGNOSIS — E782 Mixed hyperlipidemia: Secondary | ICD-10-CM

## 2018-08-22 DIAGNOSIS — I5032 Chronic diastolic (congestive) heart failure: Secondary | ICD-10-CM

## 2018-08-23 LAB — COMPLETE METABOLIC PANEL WITH GFR
AG Ratio: 1.3 (calc) (ref 1.0–2.5)
ALT: 9 U/L (ref 9–46)
AST: 16 U/L (ref 10–35)
Albumin: 3.8 g/dL (ref 3.6–5.1)
Alkaline phosphatase (APISO): 68 U/L (ref 40–115)
BUN/Creatinine Ratio: 20 (calc) (ref 6–22)
BUN: 43 mg/dL — ABNORMAL HIGH (ref 7–25)
CO2: 28 mmol/L (ref 20–32)
Calcium: 9 mg/dL (ref 8.6–10.3)
Chloride: 100 mmol/L (ref 98–110)
Creat: 2.13 mg/dL — ABNORMAL HIGH (ref 0.70–1.11)
GFR, Est African American: 29 mL/min/{1.73_m2} — ABNORMAL LOW (ref >=60)
GFR, Est Non African American: 25 mL/min/{1.73_m2} — ABNORMAL LOW (ref >=60)
Globulin: 2.9 g/dL (calc) (ref 1.9–3.7)
Glucose, Bld: 94 mg/dL (ref 65–99)
Potassium: 4.2 mmol/L (ref 3.5–5.3)
Sodium: 141 mmol/L (ref 135–146)
Total Bilirubin: 0.5 mg/dL (ref 0.2–1.2)
Total Protein: 6.7 g/dL (ref 6.1–8.1)

## 2018-08-23 LAB — LIPID PANEL
Cholesterol: 145 mg/dL
HDL: 48 mg/dL
LDL Cholesterol (Calc): 77 mg/dL
Non-HDL Cholesterol (Calc): 97 mg/dL
Total CHOL/HDL Ratio: 3 (calc)
Triglycerides: 110 mg/dL

## 2018-08-23 LAB — TSH: TSH: 1.95 m[IU]/L (ref 0.40–4.50)

## 2018-08-25 ENCOUNTER — Ambulatory Visit (INDEPENDENT_AMBULATORY_CARE_PROVIDER_SITE_OTHER): Payer: Medicare Other

## 2018-08-25 DIAGNOSIS — I4891 Unspecified atrial fibrillation: Secondary | ICD-10-CM | POA: Diagnosis not present

## 2018-08-25 DIAGNOSIS — Z7901 Long term (current) use of anticoagulants: Secondary | ICD-10-CM | POA: Diagnosis not present

## 2018-08-25 DIAGNOSIS — I4821 Permanent atrial fibrillation: Secondary | ICD-10-CM

## 2018-08-25 DIAGNOSIS — Z5181 Encounter for therapeutic drug level monitoring: Secondary | ICD-10-CM

## 2018-08-25 LAB — PROTIME-INR: INR: 2 — AB (ref 0.9–1.1)

## 2018-08-25 NOTE — Patient Instructions (Signed)
Description   Spoke with patient and advised to continue taking your same dosage 1/2 tablet (1.25 mg) daily except 1 tablet (2.5mg ) on Thursdays.  Recheck in 4 weeks. Orders faxed to [facility].

## 2018-08-30 ENCOUNTER — Encounter: Payer: Self-pay | Admitting: Family

## 2018-08-31 ENCOUNTER — Non-Acute Institutional Stay: Payer: Medicare Other | Admitting: Internal Medicine

## 2018-08-31 ENCOUNTER — Encounter: Payer: Self-pay | Admitting: Internal Medicine

## 2018-08-31 VITALS — BP 124/68 | HR 69 | Temp 97.6°F | Ht 60.0 in | Wt 157.4 lb

## 2018-08-31 DIAGNOSIS — I4821 Permanent atrial fibrillation: Secondary | ICD-10-CM

## 2018-08-31 DIAGNOSIS — E039 Hypothyroidism, unspecified: Secondary | ICD-10-CM

## 2018-08-31 DIAGNOSIS — I5032 Chronic diastolic (congestive) heart failure: Secondary | ICD-10-CM | POA: Diagnosis not present

## 2018-08-31 DIAGNOSIS — N184 Chronic kidney disease, stage 4 (severe): Secondary | ICD-10-CM | POA: Diagnosis not present

## 2018-08-31 DIAGNOSIS — F411 Generalized anxiety disorder: Secondary | ICD-10-CM | POA: Diagnosis not present
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
## 2018-09-22 DIAGNOSIS — I4891 Unspecified atrial fibrillation: Secondary | ICD-10-CM | POA: Diagnosis not present

## 2018-09-22 LAB — POCT INR: INR: 2.3 (ref 2.0–3.0)

## 2018-09-23 ENCOUNTER — Ambulatory Visit (INDEPENDENT_AMBULATORY_CARE_PROVIDER_SITE_OTHER): Payer: Medicare Other | Admitting: Internal Medicine

## 2018-09-23 DIAGNOSIS — I4821 Permanent atrial fibrillation: Secondary | ICD-10-CM

## 2018-09-23 DIAGNOSIS — Z5181 Encounter for therapeutic drug level monitoring: Secondary | ICD-10-CM | POA: Diagnosis not present

## 2018-10-13 ENCOUNTER — Telehealth: Payer: Self-pay | Admitting: *Deleted

## 2018-10-13 NOTE — Telephone Encounter (Signed)
Linda with [facility] called and stated that patient was with her and requested when he had his INR drawn next week if he could also get his blood sugar checked for Diabetes. Stated that he has had weight changes and his parents had diabetes and he just wanted his checked. Please Advise.

## 2018-10-14 NOTE — Telephone Encounter (Signed)
Call placed to patient to schedule an appointment for 10/19/18. Patient was not available I left a message for him to return my call

## 2018-10-18 NOTE — Telephone Encounter (Signed)
Appointment has been scheduled.

## 2018-10-19 ENCOUNTER — Encounter: Payer: Self-pay | Admitting: Internal Medicine

## 2018-10-19 ENCOUNTER — Other Ambulatory Visit: Payer: Self-pay

## 2018-10-19 ENCOUNTER — Non-Acute Institutional Stay: Payer: Medicare Other | Admitting: Internal Medicine

## 2018-10-19 VITALS — BP 118/78 | HR 88 | Temp 97.7°F | Ht 60.0 in | Wt 154.4 lb

## 2018-10-19 DIAGNOSIS — I4821 Permanent atrial fibrillation: Secondary | ICD-10-CM

## 2018-10-19 DIAGNOSIS — N184 Chronic kidney disease, stage 4 (severe): Secondary | ICD-10-CM

## 2018-10-19 DIAGNOSIS — I5032 Chronic diastolic (congestive) heart failure: Secondary | ICD-10-CM

## 2018-10-19 DIAGNOSIS — R739 Hyperglycemia, unspecified: Secondary | ICD-10-CM | POA: Diagnosis not present
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
## 2018-10-20 ENCOUNTER — Other Ambulatory Visit: Payer: Self-pay

## 2018-10-20 ENCOUNTER — Ambulatory Visit (INDEPENDENT_AMBULATORY_CARE_PROVIDER_SITE_OTHER): Payer: Medicare Other | Admitting: Interventional Cardiology

## 2018-10-20 ENCOUNTER — Other Ambulatory Visit: Payer: Medicare Other

## 2018-10-20 DIAGNOSIS — I5032 Chronic diastolic (congestive) heart failure: Secondary | ICD-10-CM

## 2018-10-20 DIAGNOSIS — I5021 Acute systolic (congestive) heart failure: Secondary | ICD-10-CM | POA: Diagnosis not present

## 2018-10-20 DIAGNOSIS — Z7901 Long term (current) use of anticoagulants: Secondary | ICD-10-CM | POA: Diagnosis not present

## 2018-10-20 DIAGNOSIS — I4821 Permanent atrial fibrillation: Secondary | ICD-10-CM

## 2018-10-20 DIAGNOSIS — R739 Hyperglycemia, unspecified: Secondary | ICD-10-CM

## 2018-10-20 DIAGNOSIS — Z5181 Encounter for therapeutic drug level monitoring: Secondary | ICD-10-CM

## 2018-10-20 DIAGNOSIS — I10 Essential (primary) hypertension: Secondary | ICD-10-CM | POA: Diagnosis not present

## 2018-10-20 DIAGNOSIS — I2510 Atherosclerotic heart disease of native coronary artery without angina pectoris: Secondary | ICD-10-CM | POA: Diagnosis not present

## 2018-10-20 LAB — PROTIME-INR: INR: 3 — AB (ref 0.9–1.1)

## 2018-10-20 NOTE — Patient Instructions (Signed)
Description   Spoke with patient and advised to continue taking your same dosage 1/2 tablet (1.25 mg) daily except 1 tablet (2.5mg ) on Thursdays.  Recheck in 4 weeks. Orders faxed to [facility].

## 2018-10-21 LAB — HEMOGLOBIN A1C
HEMOGLOBIN A1C: 6.1 %{Hb} — AB (ref <5.7)
Mean Plasma Glucose: 128 (calc)
eAG (mmol/L): 7.1 (calc)

## 2018-10-21 LAB — BASIC METABOLIC PANEL WITH GFR
BUN/Creatinine Ratio: 20 (calc) (ref 6–22)
BUN: 46 mg/dL — ABNORMAL HIGH (ref 7–25)
CO2: 27 mmol/L (ref 20–32)
Calcium: 9 mg/dL (ref 8.6–10.3)
Chloride: 101 mmol/L (ref 98–110)
Creat: 2.33 mg/dL — ABNORMAL HIGH (ref 0.70–1.11)
GFR, Est African American: 26 mL/min/{1.73_m2} — ABNORMAL LOW (ref >=60)
GFR, Est Non African American: 23 mL/min/{1.73_m2} — ABNORMAL LOW (ref >=60)
Glucose, Bld: 94 mg/dL (ref 65–99)
POTASSIUM: 4.1 mmol/L (ref 3.5–5.3)
SODIUM: 137 mmol/L (ref 135–146)

## 2018-10-21 LAB — CBC WITH DIFFERENTIAL/PLATELET
Absolute Monocytes: 495 cells/uL (ref 200–950)
Basophils Absolute: 50 cells/uL (ref 0–200)
Basophils Relative: 0.9 %
Eosinophils Absolute: 215 cells/uL (ref 15–500)
Eosinophils Relative: 3.9 %
HCT: 34.6 % — ABNORMAL LOW (ref 38.5–50.0)
Hemoglobin: 11.7 g/dL — ABNORMAL LOW (ref 13.2–17.1)
Lymphs Abs: 1557 cells/uL (ref 850–3900)
MCH: 30.5 pg (ref 27.0–33.0)
MCHC: 33.8 g/dL (ref 32.0–36.0)
MCV: 90.1 fL (ref 80.0–100.0)
MPV: 11.5 fL (ref 7.5–12.5)
Monocytes Relative: 9 %
Neutro Abs: 3185 cells/uL (ref 1500–7800)
Neutrophils Relative %: 57.9 %
PLATELETS: 155 10*3/uL (ref 140–400)
RBC: 3.84 10*6/uL — ABNORMAL LOW (ref 4.20–5.80)
RDW: 13 % (ref 11.0–15.0)
TOTAL LYMPHOCYTE: 28.3 %
WBC: 5.5 10*3/uL (ref 3.8–10.8)

## 2018-11-17 ENCOUNTER — Ambulatory Visit (INDEPENDENT_AMBULATORY_CARE_PROVIDER_SITE_OTHER): Payer: Medicare Other | Admitting: Cardiology

## 2018-11-17 DIAGNOSIS — Z5181 Encounter for therapeutic drug level monitoring: Secondary | ICD-10-CM | POA: Diagnosis not present

## 2018-11-17 DIAGNOSIS — I4821 Permanent atrial fibrillation: Secondary | ICD-10-CM

## 2018-11-17 DIAGNOSIS — I4891 Unspecified atrial fibrillation: Secondary | ICD-10-CM | POA: Diagnosis not present

## 2018-11-17 LAB — PROTIME-INR: INR: 2.8 — AB (ref 0.9–1.1)

## 2018-11-17 NOTE — Patient Instructions (Signed)
Description   Spoke with patient and advised to continue taking your same dosage 1/2 tablet (1.25 mg) daily except 1 tablet (2.5mg ) on Thursdays.  Recheck in 6 weeks. Orders faxed to [facility]

## 2018-11-28 ENCOUNTER — Other Ambulatory Visit: Payer: Medicare Other

## 2018-11-30 ENCOUNTER — Encounter: Payer: Self-pay | Admitting: Internal Medicine

## 2018-11-30 ENCOUNTER — Other Ambulatory Visit: Payer: Self-pay

## 2018-11-30 ENCOUNTER — Non-Acute Institutional Stay: Payer: Medicare Other | Admitting: Internal Medicine

## 2018-11-30 VITALS — BP 140/62 | HR 81 | Temp 98.2°F | Ht 60.0 in | Wt 157.6 lb

## 2018-11-30 DIAGNOSIS — I5032 Chronic diastolic (congestive) heart failure: Secondary | ICD-10-CM | POA: Diagnosis not present

## 2018-11-30 DIAGNOSIS — I2510 Atherosclerotic heart disease of native coronary artery without angina pectoris: Secondary | ICD-10-CM | POA: Diagnosis not present

## 2018-11-30 DIAGNOSIS — E039 Hypothyroidism, unspecified: Secondary | ICD-10-CM | POA: Diagnosis not present

## 2018-11-30 DIAGNOSIS — I4821 Permanent atrial fibrillation: Secondary | ICD-10-CM | POA: Diagnosis not present
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
## 2018-12-27 ENCOUNTER — Other Ambulatory Visit: Payer: Self-pay

## 2018-12-27 NOTE — Telephone Encounter (Signed)
Patient requesting refill for diazepam be sent through [facility].  Last refill was 07/25/2018 for 30 tabs no refills verified in [place] database.

## 2018-12-28 MED ORDER — DIAZEPAM 5 MG PO TABS: 5.0000 mg | ORAL_TABLET | Freq: Every day | ORAL | 3 refills | Status: DC

## 2018-12-29 ENCOUNTER — Ambulatory Visit (INDEPENDENT_AMBULATORY_CARE_PROVIDER_SITE_OTHER): Payer: Medicare Other | Admitting: Cardiovascular Disease

## 2018-12-29 DIAGNOSIS — Z5181 Encounter for therapeutic drug level monitoring: Secondary | ICD-10-CM

## 2018-12-29 DIAGNOSIS — I4891 Unspecified atrial fibrillation: Secondary | ICD-10-CM | POA: Diagnosis not present

## 2018-12-29 DIAGNOSIS — I4821 Permanent atrial fibrillation: Secondary | ICD-10-CM

## 2018-12-29 LAB — PROTIME-INR: INR: 1.6 — AB (ref 0.9–1.1)

## 2018-12-29 NOTE — Patient Instructions (Addendum)
Description   Spoke with patient instructed him to take 1.5 tabs tonight and then continue taking the same dosage 1/2 tablet (1.25 mg) daily except 1 tablet (2.5mg ) on Thursdays.  Recheck in 2 weeks. Orders faxed to [facility].

## 2019-01-12 ENCOUNTER — Ambulatory Visit (INDEPENDENT_AMBULATORY_CARE_PROVIDER_SITE_OTHER): Payer: Medicare Other

## 2019-01-12 DIAGNOSIS — Z5181 Encounter for therapeutic drug level monitoring: Secondary | ICD-10-CM

## 2019-01-12 DIAGNOSIS — I4821 Permanent atrial fibrillation: Secondary | ICD-10-CM | POA: Diagnosis not present

## 2019-01-12 DIAGNOSIS — I4891 Unspecified atrial fibrillation: Secondary | ICD-10-CM | POA: Diagnosis not present

## 2019-01-12 LAB — PROTIME-INR: INR: 1.6 — AB (ref 0.9–1.1)

## 2019-01-19 NOTE — Patient Instructions (Signed)
Description   Spoke with patient instructed him to take 1.5 tablets tonight then start taking 1/2 tablet (1.25 mg) daily except 1 tablet (2.5mg ) on Mondays and Thursdays.  Recheck in 2 weeks. Orders faxed to [facility].

## 2019-01-22 ENCOUNTER — Other Ambulatory Visit: Payer: Self-pay | Admitting: Cardiovascular Disease

## 2019-01-26 DIAGNOSIS — Z7901 Long term (current) use of anticoagulants: Secondary | ICD-10-CM | POA: Diagnosis not present

## 2019-01-26 DIAGNOSIS — I5021 Acute systolic (congestive) heart failure: Secondary | ICD-10-CM | POA: Diagnosis not present

## 2019-01-26 LAB — PROTIME-INR: INR: 3.5 — AB (ref 0.9–1.1)

## 2019-01-27 ENCOUNTER — Ambulatory Visit (INDEPENDENT_AMBULATORY_CARE_PROVIDER_SITE_OTHER): Payer: Medicare Other

## 2019-01-27 DIAGNOSIS — Z5181 Encounter for therapeutic drug level monitoring: Secondary | ICD-10-CM | POA: Diagnosis not present

## 2019-01-27 DIAGNOSIS — I4821 Permanent atrial fibrillation: Secondary | ICD-10-CM

## 2019-01-27 NOTE — Patient Instructions (Signed)
Description   Spoke with patient instructed him to skip today's dosage of Coumadin, then start taking 1/2 tablet (1.25 mg) daily except 1 tablet (2.5mg ) on Thursdays.  Recheck in 2 weeks. Orders faxed to [facility].

## 2019-02-09 ENCOUNTER — Ambulatory Visit (INDEPENDENT_AMBULATORY_CARE_PROVIDER_SITE_OTHER): Payer: Medicare Other | Admitting: Cardiovascular Disease

## 2019-02-09 DIAGNOSIS — I4821 Permanent atrial fibrillation: Secondary | ICD-10-CM

## 2019-02-09 DIAGNOSIS — I4891 Unspecified atrial fibrillation: Secondary | ICD-10-CM | POA: Diagnosis not present

## 2019-02-09 DIAGNOSIS — Z5181 Encounter for therapeutic drug level monitoring: Secondary | ICD-10-CM | POA: Diagnosis not present

## 2019-02-09 LAB — PROTIME-INR: INR: 4.4 — AB (ref 0.9–1.1)

## 2019-02-09 NOTE — Patient Instructions (Addendum)
Description   Spoke with patient instructed him to skip today and tomorrow's dosage of Coumadin, then start taking 1/2 tablet (1.25 mg) daily.  Recheck in 1 week. Orders faxed to [facility].

## 2019-02-16 ENCOUNTER — Ambulatory Visit (INDEPENDENT_AMBULATORY_CARE_PROVIDER_SITE_OTHER): Payer: Medicare Other | Admitting: Cardiology

## 2019-02-16 DIAGNOSIS — Z5181 Encounter for therapeutic drug level monitoring: Secondary | ICD-10-CM | POA: Diagnosis not present

## 2019-02-16 DIAGNOSIS — I4821 Permanent atrial fibrillation: Secondary | ICD-10-CM

## 2019-02-16 LAB — POCT INR: INR: 3 (ref 2.0–3.0)

## 2019-02-16 NOTE — Patient Instructions (Signed)
Description   Spoke with patient instructed him to continue taking 1/2 tablet (1.25 mg) daily.  Recheck in 2 weeks. Orders faxed to [facility].

## 2019-03-02 ENCOUNTER — Ambulatory Visit (INDEPENDENT_AMBULATORY_CARE_PROVIDER_SITE_OTHER): Payer: Medicare Other | Admitting: Family

## 2019-03-02 ENCOUNTER — Other Ambulatory Visit: Payer: Self-pay

## 2019-03-02 ENCOUNTER — Encounter: Payer: Self-pay | Admitting: Family

## 2019-03-02 ENCOUNTER — Other Ambulatory Visit: Payer: Self-pay | Admitting: Cardiovascular Disease

## 2019-03-02 VITALS — BP 118/70 | HR 84 | Temp 97.3°F | Ht 60.0 in | Wt 151.6 lb

## 2019-03-02 DIAGNOSIS — I4821 Permanent atrial fibrillation: Secondary | ICD-10-CM | POA: Diagnosis not present

## 2019-03-02 DIAGNOSIS — I2510 Atherosclerotic heart disease of native coronary artery without angina pectoris: Secondary | ICD-10-CM

## 2019-03-02 DIAGNOSIS — R791 Abnormal coagulation profile: Secondary | ICD-10-CM

## 2019-03-02 DIAGNOSIS — Z5181 Encounter for therapeutic drug level monitoring: Secondary | ICD-10-CM | POA: Diagnosis not present

## 2019-03-02 DIAGNOSIS — I4891 Unspecified atrial fibrillation: Secondary | ICD-10-CM | POA: Diagnosis not present

## 2019-03-02 DIAGNOSIS — R040 Epistaxis: Secondary | ICD-10-CM | POA: Diagnosis not present

## 2019-03-02 LAB — PROTIME-INR
INR: 5.3 — ABNORMAL HIGH
Prothrombin Time: 49.3 s — ABNORMAL HIGH (ref 9.0–11.5)

## 2019-03-02 LAB — POCT INR: INR: 6.2 — AB (ref 2.0–3.0)

## 2019-03-02 MED ORDER — OXYMETAZOLINE HCL 0.05 % NA SOLN: 1.0000 | Freq: Two times a day (BID) | NASAL | 0 refills | Status: DC

## 2019-03-02 NOTE — Patient Instructions (Addendum)
1. Oxymetazoline 0.005% one spray twice daily   2. Hold coumadin today 03/02/2019,03/03/2019 and 03/04/2019 then restart 03/05/2019 coumadin 1.25 mg tablet daily Monday through Saturday.May follow coumadin clinic direction if Nurse calls.   3. INR check on Monday 03/06/2019
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
## 2019-03-03 ENCOUNTER — Ambulatory Visit (INDEPENDENT_AMBULATORY_CARE_PROVIDER_SITE_OTHER): Payer: Medicare Other | Admitting: Cardiovascular Disease

## 2019-03-03 DIAGNOSIS — Z5181 Encounter for therapeutic drug level monitoring: Secondary | ICD-10-CM | POA: Diagnosis not present

## 2019-03-03 DIAGNOSIS — I4821 Permanent atrial fibrillation: Secondary | ICD-10-CM | POA: Diagnosis not present

## 2019-03-03 LAB — PROTIME-INR: INR: 5.3 — AB (ref 0.9–1.1)

## 2019-03-03 NOTE — Patient Instructions (Addendum)
Description   Spoke with patient instructed him to (held yesterday's dose), hold today's dose and hold tomorrow's dose then take 1/2 tablet Sunday. Normal Dose: 1/2 tablet (1.25 mg) daily.  Recheck on Monday. Orders faxed to [facility] at [phone].

## 2019-03-06 ENCOUNTER — Other Ambulatory Visit: Payer: Medicare Other

## 2019-03-06 ENCOUNTER — Other Ambulatory Visit: Payer: Self-pay

## 2019-03-06 ENCOUNTER — Ambulatory Visit (INDEPENDENT_AMBULATORY_CARE_PROVIDER_SITE_OTHER): Payer: Medicare Other | Admitting: Interventional Cardiology

## 2019-03-06 DIAGNOSIS — I5032 Chronic diastolic (congestive) heart failure: Secondary | ICD-10-CM

## 2019-03-06 DIAGNOSIS — E039 Hypothyroidism, unspecified: Secondary | ICD-10-CM | POA: Diagnosis not present

## 2019-03-06 DIAGNOSIS — I4821 Permanent atrial fibrillation: Secondary | ICD-10-CM

## 2019-03-06 DIAGNOSIS — Z5181 Encounter for therapeutic drug level monitoring: Secondary | ICD-10-CM | POA: Diagnosis not present

## 2019-03-06 LAB — COMPLETE METABOLIC PANEL WITH GFR
AG Ratio: 1.1 (calc) (ref 1.0–2.5)
ALT: 9 U/L (ref 9–46)
AST: 15 U/L (ref 10–35)
Albumin: 3.5 g/dL — ABNORMAL LOW (ref 3.6–5.1)
Alkaline phosphatase (APISO): 63 U/L (ref 35–144)
BUN/Creatinine Ratio: 18 (calc) (ref 6–22)
BUN: 41 mg/dL — ABNORMAL HIGH (ref 7–25)
CO2: 25 mmol/L (ref 20–32)
Calcium: 8.4 mg/dL — ABNORMAL LOW (ref 8.6–10.3)
Chloride: 102 mmol/L (ref 98–110)
Creat: 2.31 mg/dL — ABNORMAL HIGH (ref 0.70–1.11)
GFR, Est African American: 27 mL/min/{1.73_m2} — ABNORMAL LOW (ref >=60)
GFR, Est Non African American: 23 mL/min/{1.73_m2} — ABNORMAL LOW (ref >=60)
Globulin: 3.1 g/dL (calc) (ref 1.9–3.7)
Glucose, Bld: 101 mg/dL — ABNORMAL HIGH (ref 65–99)
Potassium: 3.6 mmol/L (ref 3.5–5.3)
Sodium: 138 mmol/L (ref 135–146)
Total Bilirubin: 0.5 mg/dL (ref 0.2–1.2)
Total Protein: 6.6 g/dL (ref 6.1–8.1)

## 2019-03-06 LAB — TSH: TSH: 14.08 mIU/L — ABNORMAL HIGH (ref 0.40–4.50)

## 2019-03-06 LAB — CBC WITH DIFFERENTIAL/PLATELET
Absolute Monocytes: 830 cells/uL (ref 200–950)
Basophils Absolute: 91 cells/uL (ref 0–200)
Basophils Relative: 1.1 %
Eosinophils Absolute: 465 cells/uL (ref 15–500)
Eosinophils Relative: 5.6 %
HCT: 30.2 % — ABNORMAL LOW (ref 38.5–50.0)
Hemoglobin: 9.9 g/dL — ABNORMAL LOW (ref 13.2–17.1)
Lymphs Abs: 2465 cells/uL (ref 850–3900)
MCH: 28.7 pg (ref 27.0–33.0)
MCHC: 32.8 g/dL (ref 32.0–36.0)
MCV: 87.5 fL (ref 80.0–100.0)
MPV: 11.2 fL (ref 7.5–12.5)
Monocytes Relative: 10 %
Neutro Abs: 4449 cells/uL (ref 1500–7800)
Neutrophils Relative %: 53.6 %
Platelets: 191 10*3/uL (ref 140–400)
RBC: 3.45 10*6/uL — ABNORMAL LOW (ref 4.20–5.80)
RDW: 14.1 % (ref 11.0–15.0)
Total Lymphocyte: 29.7 %
WBC: 8.3 10*3/uL (ref 3.8–10.8)

## 2019-03-06 LAB — PROTIME-INR
INR: 3.3 — AB (ref <=1.1)
INR: 3.3 — ABNORMAL HIGH
Prothrombin Time: 32 s — ABNORMAL HIGH (ref 9.0–11.5)

## 2019-03-06 LAB — MAGNESIUM: Magnesium: 2.1 mg/dL (ref 1.5–2.5)

## 2019-03-06 NOTE — Patient Instructions (Signed)
Description   Spoke with patient instructed him since he held yesterday's dose then to resume your dose of 1/2 tablet (1.25 mg) daily. Pt can have a dark green leafy veggie and resume normal intake. Recheck on Thursday. Orders faxed to [place] at [phone].

## 2019-03-09 ENCOUNTER — Other Ambulatory Visit: Payer: Self-pay | Admitting: Cardiovascular Disease

## 2019-03-09 ENCOUNTER — Ambulatory Visit (INDEPENDENT_AMBULATORY_CARE_PROVIDER_SITE_OTHER): Payer: Medicare Other | Admitting: Cardiology

## 2019-03-09 DIAGNOSIS — Z5181 Encounter for therapeutic drug level monitoring: Secondary | ICD-10-CM | POA: Diagnosis not present

## 2019-03-09 DIAGNOSIS — I4821 Permanent atrial fibrillation: Secondary | ICD-10-CM

## 2019-03-09 DIAGNOSIS — I4891 Unspecified atrial fibrillation: Secondary | ICD-10-CM | POA: Diagnosis not present

## 2019-03-09 LAB — PROTIME-INR
INR: 2.6 — ABNORMAL HIGH
Prothrombin Time: 25.1 s — ABNORMAL HIGH (ref 9.0–11.5)

## 2019-03-09 NOTE — Patient Instructions (Signed)
Description   Spoke with patient instructed him to continue taking 1/2 tablet (1.25 mg) daily. Pt can have a dark green leafy veggie and resume normal intake. Recheck in 1 week. Orders faxed to [facility] at [phone].

## 2019-03-14 DIAGNOSIS — R040 Epistaxis: Secondary | ICD-10-CM | POA: Diagnosis not present

## 2019-03-16 ENCOUNTER — Other Ambulatory Visit: Payer: Self-pay | Admitting: Cardiovascular Disease

## 2019-03-16 ENCOUNTER — Ambulatory Visit (INDEPENDENT_AMBULATORY_CARE_PROVIDER_SITE_OTHER): Payer: Medicare Other | Admitting: Cardiovascular Disease

## 2019-03-16 DIAGNOSIS — Z5181 Encounter for therapeutic drug level monitoring: Secondary | ICD-10-CM

## 2019-03-16 DIAGNOSIS — I4821 Permanent atrial fibrillation: Secondary | ICD-10-CM | POA: Diagnosis not present

## 2019-03-16 DIAGNOSIS — I4891 Unspecified atrial fibrillation: Secondary | ICD-10-CM | POA: Diagnosis not present

## 2019-03-16 LAB — PROTIME-INR
INR: 3.4 — AB (ref <=1.1)
INR: 3.4 — ABNORMAL HIGH
Prothrombin Time: 32.4 s — ABNORMAL HIGH (ref 9.0–11.5)

## 2019-03-16 NOTE — Patient Instructions (Signed)
Description   Spoke with patient instructed him to skip warfarin today, then continue taking 1/2 tablet (1.25 mg) daily. Pt can have a dark green leafy veggie and resume normal intake. Recheck in 2 weeks. Orders faxed to [place] at [phone].

## 2019-03-30 ENCOUNTER — Other Ambulatory Visit: Payer: Self-pay | Admitting: Cardiovascular Disease

## 2019-03-30 ENCOUNTER — Other Ambulatory Visit: Payer: Self-pay

## 2019-03-30 DIAGNOSIS — I4821 Permanent atrial fibrillation: Secondary | ICD-10-CM | POA: Diagnosis not present

## 2019-03-30 LAB — PROTIME-INR
INR: 1.9 — ABNORMAL HIGH
Prothrombin Time: 19.3 s — ABNORMAL HIGH (ref 9.0–11.5)

## 2019-03-31 ENCOUNTER — Ambulatory Visit (INDEPENDENT_AMBULATORY_CARE_PROVIDER_SITE_OTHER): Payer: Medicare Other | Admitting: Pharmacist

## 2019-03-31 DIAGNOSIS — I4821 Permanent atrial fibrillation: Secondary | ICD-10-CM

## 2019-03-31 DIAGNOSIS — Z5181 Encounter for therapeutic drug level monitoring: Secondary | ICD-10-CM | POA: Diagnosis not present

## 2019-04-03 NOTE — Patient Instructions (Signed)
Description   Spoke with patient instructed him to continue taking 1/2 tablet (1.25 mg) daily. Continue normal intake. Recheck in 2 weeks. Orders faxed to [place] at [phone].

## 2019-04-05 ENCOUNTER — Other Ambulatory Visit: Payer: Self-pay

## 2019-04-05 ENCOUNTER — Non-Acute Institutional Stay: Payer: Medicare Other | Admitting: Internal Medicine

## 2019-04-05 ENCOUNTER — Encounter: Payer: Self-pay | Admitting: Internal Medicine

## 2019-04-05 VITALS — BP 130/62 | HR 85 | Temp 97.2°F | Resp 20 | Ht 60.0 in | Wt 150.2 lb

## 2019-04-05 DIAGNOSIS — D631 Anemia in chronic kidney disease: Secondary | ICD-10-CM | POA: Diagnosis not present

## 2019-04-05 DIAGNOSIS — E039 Hypothyroidism, unspecified: Secondary | ICD-10-CM

## 2019-04-05 DIAGNOSIS — I5032 Chronic diastolic (congestive) heart failure: Secondary | ICD-10-CM | POA: Diagnosis not present

## 2019-04-05 DIAGNOSIS — R609 Edema, unspecified: Secondary | ICD-10-CM

## 2019-04-05 DIAGNOSIS — I4821 Permanent atrial fibrillation: Secondary | ICD-10-CM | POA: Diagnosis not present

## 2019-04-05 DIAGNOSIS — N184 Chronic kidney disease, stage 4 (severe): Secondary | ICD-10-CM

## 2019-04-05 MED ORDER — LEVOTHYROXINE SODIUM 88 MCG PO TABS: 88.0000 ug | ORAL_TABLET | Freq: Every day | ORAL | 3 refills | Status: DC
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
## 2019-04-11 DIAGNOSIS — R0982 Postnasal drip: Secondary | ICD-10-CM | POA: Diagnosis not present

## 2019-04-11 DIAGNOSIS — R040 Epistaxis: Secondary | ICD-10-CM | POA: Diagnosis not present

## 2019-04-13 ENCOUNTER — Ambulatory Visit (INDEPENDENT_AMBULATORY_CARE_PROVIDER_SITE_OTHER): Payer: Medicare Other | Admitting: Cardiovascular Disease

## 2019-04-13 ENCOUNTER — Other Ambulatory Visit: Payer: Self-pay | Admitting: Cardiovascular Disease

## 2019-04-13 DIAGNOSIS — I4821 Permanent atrial fibrillation: Secondary | ICD-10-CM

## 2019-04-13 DIAGNOSIS — I4891 Unspecified atrial fibrillation: Secondary | ICD-10-CM | POA: Diagnosis not present

## 2019-04-13 DIAGNOSIS — Z5181 Encounter for therapeutic drug level monitoring: Secondary | ICD-10-CM

## 2019-04-13 LAB — PROTIME-INR
INR: 1.7 — AB (ref 0.9–1.1)
INR: 1.7 — ABNORMAL HIGH
Prothrombin Time: 17.2 s — ABNORMAL HIGH (ref 9.0–11.5)

## 2019-04-13 NOTE — Patient Instructions (Signed)
Description   Spoke with patient instructed him to take 1 tablet today, then continue taking 1/2 tablet daily. Recheck in 2 weeks. Orders faxed to [facility] at [phone].

## 2019-04-20 DIAGNOSIS — L821 Other seborrheic keratosis: Secondary | ICD-10-CM | POA: Diagnosis not present

## 2019-04-20 DIAGNOSIS — L814 Other melanin hyperpigmentation: Secondary | ICD-10-CM | POA: Diagnosis not present

## 2019-04-20 DIAGNOSIS — D1801 Hemangioma of skin and subcutaneous tissue: Secondary | ICD-10-CM | POA: Diagnosis not present

## 2019-04-20 DIAGNOSIS — L570 Actinic keratosis: Secondary | ICD-10-CM | POA: Diagnosis not present

## 2019-04-27 ENCOUNTER — Ambulatory Visit (INDEPENDENT_AMBULATORY_CARE_PROVIDER_SITE_OTHER): Payer: Medicare Other | Admitting: Pharmacist

## 2019-04-27 ENCOUNTER — Other Ambulatory Visit: Payer: Self-pay | Admitting: Cardiovascular Disease

## 2019-04-27 DIAGNOSIS — I4821 Permanent atrial fibrillation: Secondary | ICD-10-CM

## 2019-04-27 DIAGNOSIS — I4891 Unspecified atrial fibrillation: Secondary | ICD-10-CM | POA: Diagnosis not present

## 2019-04-27 DIAGNOSIS — Z5181 Encounter for therapeutic drug level monitoring: Secondary | ICD-10-CM | POA: Diagnosis not present

## 2019-04-27 LAB — PROTIME-INR
INR: 1.5 — ABNORMAL HIGH
Prothrombin Time: 15.3 s — ABNORMAL HIGH (ref 9.0–11.5)

## 2019-04-27 LAB — POCT INR: INR: 1.5 — AB (ref 2.0–3.0)

## 2019-04-30 ENCOUNTER — Other Ambulatory Visit: Payer: Self-pay | Admitting: Family

## 2019-05-01 ENCOUNTER — Other Ambulatory Visit: Payer: Self-pay | Admitting: *Deleted

## 2019-05-01 MED ORDER — DIAZEPAM 5 MG PO TABS: 5.0000 mg | ORAL_TABLET | Freq: Every day | ORAL | 3 refills | Status: DC

## 2019-05-01 NOTE — Telephone Encounter (Signed)
Received fax refill Request Pended Rx and sent to Dr. Gupta for approval.  

## 2019-05-11 ENCOUNTER — Other Ambulatory Visit: Payer: Self-pay | Admitting: Cardiovascular Disease

## 2019-05-11 ENCOUNTER — Ambulatory Visit (INDEPENDENT_AMBULATORY_CARE_PROVIDER_SITE_OTHER): Payer: Medicare Other | Admitting: Internal Medicine

## 2019-05-11 DIAGNOSIS — I4821 Permanent atrial fibrillation: Secondary | ICD-10-CM | POA: Diagnosis not present

## 2019-05-11 DIAGNOSIS — Z5181 Encounter for therapeutic drug level monitoring: Secondary | ICD-10-CM

## 2019-05-11 DIAGNOSIS — Z86718 Personal history of other venous thrombosis and embolism: Secondary | ICD-10-CM | POA: Diagnosis not present

## 2019-05-11 LAB — PROTIME-INR
INR: 2.5 — AB (ref 0.9–1.1)
INR: 2.5 — ABNORMAL HIGH
Prothrombin Time: 24.9 s — ABNORMAL HIGH (ref 9.0–11.5)

## 2019-05-11 NOTE — Patient Instructions (Signed)
Description   Spoke with patient instructed him to continue taking 1/2 tablet daily except 1 tablet on Thursdays. Recheck in 2 weeks with wife. Orders faxed to [place] at [phone].

## 2019-05-24 ENCOUNTER — Other Ambulatory Visit: Payer: Self-pay

## 2019-05-24 MED ORDER — OMEPRAZOLE 20 MG PO CPDR: 20.0000 mg | DELAYED_RELEASE_CAPSULE | Freq: Every day | ORAL | 1 refills | Status: DC

## 2019-05-24 NOTE — Telephone Encounter (Signed)
Incoming fax received from [facility] for refill on Omeprazole   RX sent electronically

## 2019-05-25 ENCOUNTER — Other Ambulatory Visit: Payer: Self-pay | Admitting: Cardiovascular Disease

## 2019-05-25 ENCOUNTER — Ambulatory Visit (INDEPENDENT_AMBULATORY_CARE_PROVIDER_SITE_OTHER): Payer: Medicare Other

## 2019-05-25 DIAGNOSIS — I4891 Unspecified atrial fibrillation: Secondary | ICD-10-CM | POA: Diagnosis not present

## 2019-05-25 DIAGNOSIS — Z5181 Encounter for therapeutic drug level monitoring: Secondary | ICD-10-CM | POA: Diagnosis not present

## 2019-05-25 DIAGNOSIS — I4821 Permanent atrial fibrillation: Secondary | ICD-10-CM

## 2019-05-25 LAB — PROTIME-INR
INR: 3.2 — ABNORMAL HIGH
Prothrombin Time: 31.4 s — ABNORMAL HIGH (ref 9.0–11.5)

## 2019-05-25 NOTE — Patient Instructions (Addendum)
  Description   Spoke with patient instructed him to start taking 1/2 tablet daily.  Recheck in 2 weeks. Orders faxed to [place] at [phone].

## 2019-05-30 ENCOUNTER — Other Ambulatory Visit: Payer: Self-pay | Admitting: *Deleted

## 2019-05-30 MED ORDER — METOPROLOL SUCCINATE ER 25 MG PO TB24: 25.0000 mg | ORAL_TABLET | Freq: Every day | ORAL | 3 refills | Status: DC

## 2019-05-30 NOTE — Telephone Encounter (Signed)
[facility] 

## 2019-06-05 ENCOUNTER — Other Ambulatory Visit: Payer: Self-pay

## 2019-06-05 DIAGNOSIS — I5032 Chronic diastolic (congestive) heart failure: Secondary | ICD-10-CM | POA: Diagnosis not present

## 2019-06-05 DIAGNOSIS — I4821 Permanent atrial fibrillation: Secondary | ICD-10-CM | POA: Diagnosis not present

## 2019-06-05 DIAGNOSIS — E039 Hypothyroidism, unspecified: Secondary | ICD-10-CM | POA: Diagnosis not present

## 2019-06-06 LAB — COMPLETE METABOLIC PANEL WITH GFR
AG Ratio: 1.4 (calc) (ref 1.0–2.5)
ALT: 15 U/L (ref 9–46)
AST: 21 U/L (ref 10–35)
Albumin: 3.8 g/dL (ref 3.6–5.1)
Alkaline phosphatase (APISO): 85 U/L (ref 35–144)
BUN/Creatinine Ratio: 15 (calc) (ref 6–22)
BUN: 36 mg/dL — ABNORMAL HIGH (ref 7–25)
CO2: 27 mmol/L (ref 20–32)
Calcium: 8.8 mg/dL (ref 8.6–10.3)
Chloride: 100 mmol/L (ref 98–110)
Creat: 2.48 mg/dL — ABNORMAL HIGH (ref 0.70–1.11)
GFR, Est African American: 24 mL/min/{1.73_m2} — ABNORMAL LOW (ref >=60)
GFR, Est Non African American: 21 mL/min/{1.73_m2} — ABNORMAL LOW (ref >=60)
Globulin: 2.8 g/dL (calc) (ref 1.9–3.7)
Glucose, Bld: 109 mg/dL — ABNORMAL HIGH (ref 65–99)
Potassium: 4 mmol/L (ref 3.5–5.3)
Sodium: 138 mmol/L (ref 135–146)
Total Bilirubin: 0.4 mg/dL (ref 0.2–1.2)
Total Protein: 6.6 g/dL (ref 6.1–8.1)

## 2019-06-06 LAB — CBC WITH DIFFERENTIAL/PLATELET
Absolute Monocytes: 877 cells/uL (ref 200–950)
Basophils Absolute: 77 cells/uL (ref 0–200)
Basophils Relative: 0.9 %
Eosinophils Absolute: 198 cells/uL (ref 15–500)
Eosinophils Relative: 2.3 %
HCT: 33.8 % — ABNORMAL LOW (ref 38.5–50.0)
Hemoglobin: 10.7 g/dL — ABNORMAL LOW (ref 13.2–17.1)
Lymphs Abs: 2365 cells/uL (ref 850–3900)
MCH: 28.5 pg (ref 27.0–33.0)
MCHC: 31.7 g/dL — ABNORMAL LOW (ref 32.0–36.0)
MCV: 90.1 fL (ref 80.0–100.0)
MPV: 11.3 fL (ref 7.5–12.5)
Monocytes Relative: 10.2 %
Neutro Abs: 5083 cells/uL (ref 1500–7800)
Neutrophils Relative %: 59.1 %
Platelets: 290 10*3/uL (ref 140–400)
RBC: 3.75 10*6/uL — ABNORMAL LOW (ref 4.20–5.80)
RDW: 12.9 % (ref 11.0–15.0)
Total Lymphocyte: 27.5 %
WBC: 8.6 10*3/uL (ref 3.8–10.8)

## 2019-06-06 LAB — LIPID PANEL
Cholesterol: 132 mg/dL (ref <200)
HDL: 50 mg/dL (ref >=40)
LDL Cholesterol (Calc): 65 mg/dL (calc)
Non-HDL Cholesterol (Calc): 82 mg/dL (calc) (ref <130)
Total CHOL/HDL Ratio: 2.6 (calc) (ref <5.0)
Triglycerides: 87 mg/dL (ref <150)

## 2019-06-06 LAB — TSH: TSH: 5.27 mIU/L — ABNORMAL HIGH (ref 0.40–4.50)

## 2019-06-08 ENCOUNTER — Ambulatory Visit (INDEPENDENT_AMBULATORY_CARE_PROVIDER_SITE_OTHER): Payer: Medicare Other | Admitting: Cardiology

## 2019-06-08 DIAGNOSIS — Z5181 Encounter for therapeutic drug level monitoring: Secondary | ICD-10-CM

## 2019-06-08 DIAGNOSIS — I4821 Permanent atrial fibrillation: Secondary | ICD-10-CM

## 2019-06-08 DIAGNOSIS — I4891 Unspecified atrial fibrillation: Secondary | ICD-10-CM | POA: Diagnosis not present

## 2019-06-08 LAB — PROTIME-INR
INR: 3.3 — AB (ref 0.9–1.1)
INR: 3.3 — AB (ref 0.9–1.1)

## 2019-06-20 DIAGNOSIS — L820 Inflamed seborrheic keratosis: Secondary | ICD-10-CM | POA: Diagnosis not present

## 2019-06-20 DIAGNOSIS — L570 Actinic keratosis: Secondary | ICD-10-CM | POA: Diagnosis not present

## 2019-06-20 DIAGNOSIS — D1801 Hemangioma of skin and subcutaneous tissue: Secondary | ICD-10-CM | POA: Diagnosis not present

## 2019-06-20 DIAGNOSIS — L814 Other melanin hyperpigmentation: Secondary | ICD-10-CM | POA: Diagnosis not present

## 2019-06-20 DIAGNOSIS — L821 Other seborrheic keratosis: Secondary | ICD-10-CM | POA: Diagnosis not present

## 2019-06-22 ENCOUNTER — Ambulatory Visit (INDEPENDENT_AMBULATORY_CARE_PROVIDER_SITE_OTHER): Payer: Medicare Other | Admitting: Cardiology

## 2019-06-22 DIAGNOSIS — I4891 Unspecified atrial fibrillation: Secondary | ICD-10-CM | POA: Diagnosis not present

## 2019-06-22 DIAGNOSIS — Z5181 Encounter for therapeutic drug level monitoring: Secondary | ICD-10-CM | POA: Diagnosis not present

## 2019-06-22 LAB — PROTIME-INR: INR: 2.4 — AB (ref 0.9–1.1)

## 2019-06-22 NOTE — Progress Notes (Signed)
Pt stated that he dose not need a refill of coumadin at this time.

## 2019-06-22 NOTE — Patient Instructions (Signed)
Description   Continue taking 1/2 tablet daily. (Pt may need a new tablet strength at next visit). Recheck in 3 weeks. Orders faxed to [place] at [phone].

## 2019-07-04 ENCOUNTER — Other Ambulatory Visit: Payer: Self-pay | Admitting: *Deleted

## 2019-07-04 MED ORDER — FUROSEMIDE 40 MG PO TABS: ORAL_TABLET | ORAL | 1 refills | Status: DC

## 2019-07-04 NOTE — Telephone Encounter (Signed)
[facility] 

## 2019-07-05 ENCOUNTER — Telehealth: Payer: Self-pay

## 2019-07-05 ENCOUNTER — Other Ambulatory Visit: Payer: Self-pay

## 2019-07-05 ENCOUNTER — Encounter: Payer: Self-pay | Admitting: Internal Medicine

## 2019-07-05 ENCOUNTER — Encounter: Payer: Medicare Other | Admitting: Internal Medicine

## 2019-07-05 NOTE — Telephone Encounter (Signed)
Patient was scheduled in the clinic today at 2:30p. No show. Called patient. LMOM to return call to reschedule if he couldn't make it in today.

## 2019-07-10 ENCOUNTER — Ambulatory Visit (INDEPENDENT_AMBULATORY_CARE_PROVIDER_SITE_OTHER): Payer: Medicare Other | Admitting: Cardiovascular Disease

## 2019-07-10 ENCOUNTER — Other Ambulatory Visit: Payer: Self-pay | Admitting: Cardiovascular Disease

## 2019-07-10 DIAGNOSIS — I4891 Unspecified atrial fibrillation: Secondary | ICD-10-CM

## 2019-07-10 DIAGNOSIS — Z5181 Encounter for therapeutic drug level monitoring: Secondary | ICD-10-CM | POA: Diagnosis not present

## 2019-07-10 LAB — PROTIME-INR
INR: 2.2 — AB (ref 0.9–1.1)
INR: 2.2 — ABNORMAL HIGH
Prothrombin Time: 21.6 s — ABNORMAL HIGH (ref 9.0–11.5)

## 2019-07-10 NOTE — Patient Instructions (Signed)
Description   Spoke with pt and instructed to continue taking 1/2 tablet daily. Recheck in 4 weeks. Orders faxed to [facility] at [phone].

## 2019-07-19 ENCOUNTER — Other Ambulatory Visit: Payer: Self-pay

## 2019-07-19 ENCOUNTER — Non-Acute Institutional Stay: Payer: Medicare Other | Admitting: Internal Medicine

## 2019-07-19 ENCOUNTER — Encounter: Payer: Self-pay | Admitting: Internal Medicine

## 2019-07-19 VITALS — BP 134/76 | HR 83 | Temp 97.5°F | Ht 60.0 in | Wt 154.0 lb

## 2019-07-19 DIAGNOSIS — E039 Hypothyroidism, unspecified: Secondary | ICD-10-CM

## 2019-07-19 DIAGNOSIS — D631 Anemia in chronic kidney disease: Secondary | ICD-10-CM

## 2019-07-19 DIAGNOSIS — I5032 Chronic diastolic (congestive) heart failure: Secondary | ICD-10-CM

## 2019-07-19 DIAGNOSIS — N184 Chronic kidney disease, stage 4 (severe): Secondary | ICD-10-CM

## 2019-07-19 DIAGNOSIS — J3489 Other specified disorders of nose and nasal sinuses: Secondary | ICD-10-CM

## 2019-07-19 DIAGNOSIS — R609 Edema, unspecified: Secondary | ICD-10-CM

## 2019-07-19 DIAGNOSIS — I4821 Permanent atrial fibrillation: Secondary | ICD-10-CM | POA: Diagnosis not present
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
## 2019-07-22 NOTE — Progress Notes (Signed)
A user error has taken place.

## 2019-07-31 ENCOUNTER — Other Ambulatory Visit: Payer: Self-pay

## 2019-07-31 MED ORDER — DIAZEPAM 5 MG PO TABS: 5.0000 mg | ORAL_TABLET | Freq: Every day | ORAL | 3 refills | Status: DC

## 2019-08-08 ENCOUNTER — Other Ambulatory Visit: Payer: Self-pay

## 2019-08-08 MED ORDER — DIAZEPAM 5 MG PO TABS: 5.0000 mg | ORAL_TABLET | Freq: Every day | ORAL | 3 refills | Status: DC

## 2019-08-08 NOTE — Telephone Encounter (Signed)
[name]  from [employer] called,   and said could the Dr. Julienne Kass a verbal  O.K for  She can be reach Diazepam (Valum)  that was shipped on 12//16/20 but is los/delayed  in the mail. [name] stated thru tracking the package does not have a delivery date. She can be reached at [phone] ext [phone]. Pendent prescription sent to [facility]

## 2019-08-10 ENCOUNTER — Ambulatory Visit (INDEPENDENT_AMBULATORY_CARE_PROVIDER_SITE_OTHER): Payer: Medicare Other

## 2019-08-10 ENCOUNTER — Other Ambulatory Visit: Payer: Self-pay | Admitting: Cardiovascular Disease

## 2019-08-10 DIAGNOSIS — Z5181 Encounter for therapeutic drug level monitoring: Secondary | ICD-10-CM

## 2019-08-10 DIAGNOSIS — I4891 Unspecified atrial fibrillation: Secondary | ICD-10-CM | POA: Diagnosis not present

## 2019-08-10 LAB — PROTIME-INR
INR: 1.7 — AB (ref <=1.1)
INR: 1.7 — ABNORMAL HIGH
Prothrombin Time: 17.1 s — ABNORMAL HIGH (ref 9.0–11.5)

## 2019-08-10 NOTE — Patient Instructions (Addendum)
  Description   Spoke with pt and instructed him to start taking 1/2 tablet daily except 1 tablet on Thursdays. Recheck in 3 weeks. Orders faxed to [place] at [phone].

## 2019-08-15 DIAGNOSIS — Z23 Encounter for immunization: Secondary | ICD-10-CM | POA: Diagnosis not present

## 2019-08-15 NOTE — Telephone Encounter (Signed)
RX sent by NP

## 2019-08-16 ENCOUNTER — Other Ambulatory Visit: Payer: Self-pay

## 2019-08-16 ENCOUNTER — Non-Acute Institutional Stay: Payer: Medicare Other | Admitting: Internal Medicine

## 2019-08-16 VITALS — HR 83 | Temp 97.9°F | Ht 60.0 in | Wt 151.2 lb

## 2019-08-16 DIAGNOSIS — H6120 Impacted cerumen, unspecified ear: Secondary | ICD-10-CM

## 2019-08-22 DIAGNOSIS — L570 Actinic keratosis: Secondary | ICD-10-CM | POA: Diagnosis not present

## 2019-08-22 DIAGNOSIS — L820 Inflamed seborrheic keratosis: Secondary | ICD-10-CM | POA: Diagnosis not present

## 2019-08-22 DIAGNOSIS — D1801 Hemangioma of skin and subcutaneous tissue: Secondary | ICD-10-CM | POA: Diagnosis not present

## 2019-08-22 DIAGNOSIS — L853 Xerosis cutis: Secondary | ICD-10-CM | POA: Diagnosis not present

## 2019-08-22 DIAGNOSIS — L821 Other seborrheic keratosis: Secondary | ICD-10-CM | POA: Diagnosis not present

## 2019-08-22 DIAGNOSIS — L814 Other melanin hyperpigmentation: Secondary | ICD-10-CM | POA: Diagnosis not present
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
## 2019-08-31 ENCOUNTER — Other Ambulatory Visit: Payer: Self-pay | Admitting: Cardiovascular Disease

## 2019-08-31 ENCOUNTER — Ambulatory Visit (INDEPENDENT_AMBULATORY_CARE_PROVIDER_SITE_OTHER): Payer: Medicare Other | Admitting: *Deleted

## 2019-08-31 DIAGNOSIS — I4891 Unspecified atrial fibrillation: Secondary | ICD-10-CM

## 2019-08-31 DIAGNOSIS — Z5181 Encounter for therapeutic drug level monitoring: Secondary | ICD-10-CM | POA: Diagnosis not present

## 2019-08-31 LAB — PROTIME-INR
INR: 1.6 — AB (ref 0.9–1.1)
INR: 1.6 — ABNORMAL HIGH
Prothrombin Time: 16.7 s — ABNORMAL HIGH (ref 9.0–11.5)

## 2019-08-31 NOTE — Patient Instructions (Signed)
Description   Spoke with pt and instructed him to take 1.5 tablets today then start taking 1/2 tablet daily except 1 tablet on Sundays and Thursdays. Recheck in 1 week. Orders faxed to [place] at [phone].

## 2019-09-04 ENCOUNTER — Other Ambulatory Visit: Payer: Self-pay | Admitting: *Deleted

## 2019-09-04 MED ORDER — DIAZEPAM 5 MG PO TABS: 5.0000 mg | ORAL_TABLET | Freq: Every day | ORAL | 3 refills | Status: DC

## 2019-09-04 NOTE — Telephone Encounter (Signed)
Received fax from [facility] for refill Pended and sent to [facility] for approval.

## 2019-09-07 ENCOUNTER — Other Ambulatory Visit: Payer: Self-pay | Admitting: Cardiovascular Disease

## 2019-09-07 ENCOUNTER — Ambulatory Visit (INDEPENDENT_AMBULATORY_CARE_PROVIDER_SITE_OTHER): Payer: Medicare Other

## 2019-09-07 DIAGNOSIS — I4891 Unspecified atrial fibrillation: Secondary | ICD-10-CM

## 2019-09-07 DIAGNOSIS — Z5181 Encounter for therapeutic drug level monitoring: Secondary | ICD-10-CM

## 2019-09-07 LAB — PROTIME-INR
INR: 2.3 — AB (ref 0.9–1.1)
INR: 2.3 — ABNORMAL HIGH
Prothrombin Time: 22.7 s — ABNORMAL HIGH (ref 9.0–11.5)

## 2019-09-07 NOTE — Patient Instructions (Signed)
Description   Spoke with pt and instructed him to continue same dosage 1/2 tablet daily except 1 tablet on Sundays and Thursdays. Recheck in 3 weeks. Orders faxed to [facility] at [phone].

## 2019-09-11 DIAGNOSIS — Z23 Encounter for immunization: Secondary | ICD-10-CM | POA: Diagnosis not present

## 2019-09-19 DIAGNOSIS — D1801 Hemangioma of skin and subcutaneous tissue: Secondary | ICD-10-CM | POA: Diagnosis not present

## 2019-09-19 DIAGNOSIS — L570 Actinic keratosis: Secondary | ICD-10-CM | POA: Diagnosis not present

## 2019-09-19 DIAGNOSIS — L820 Inflamed seborrheic keratosis: Secondary | ICD-10-CM | POA: Diagnosis not present

## 2019-09-19 DIAGNOSIS — L814 Other melanin hyperpigmentation: Secondary | ICD-10-CM | POA: Diagnosis not present

## 2019-09-19 DIAGNOSIS — L821 Other seborrheic keratosis: Secondary | ICD-10-CM | POA: Diagnosis not present

## 2019-09-28 ENCOUNTER — Ambulatory Visit (INDEPENDENT_AMBULATORY_CARE_PROVIDER_SITE_OTHER): Payer: Medicare Other | Admitting: Pharmacist

## 2019-09-28 ENCOUNTER — Other Ambulatory Visit: Payer: Self-pay | Admitting: Cardiovascular Disease

## 2019-09-28 DIAGNOSIS — Z5181 Encounter for therapeutic drug level monitoring: Secondary | ICD-10-CM

## 2019-09-28 DIAGNOSIS — I4891 Unspecified atrial fibrillation: Secondary | ICD-10-CM

## 2019-09-28 LAB — PROTIME-INR
INR: 2.9 — ABNORMAL HIGH
Prothrombin Time: 28.4 s — ABNORMAL HIGH (ref 9.0–11.5)

## 2019-10-17 DIAGNOSIS — L853 Xerosis cutis: Secondary | ICD-10-CM | POA: Diagnosis not present

## 2019-10-17 DIAGNOSIS — H00022 Hordeolum internum right lower eyelid: Secondary | ICD-10-CM | POA: Diagnosis not present

## 2019-10-17 DIAGNOSIS — L821 Other seborrheic keratosis: Secondary | ICD-10-CM | POA: Diagnosis not present

## 2019-10-17 DIAGNOSIS — L570 Actinic keratosis: Secondary | ICD-10-CM | POA: Diagnosis not present

## 2019-10-17 DIAGNOSIS — D1801 Hemangioma of skin and subcutaneous tissue: Secondary | ICD-10-CM | POA: Diagnosis not present

## 2019-10-25 ENCOUNTER — Other Ambulatory Visit: Payer: Self-pay

## 2019-10-25 MED ORDER — SIMVASTATIN 10 MG PO TABS: ORAL_TABLET | ORAL | 0 refills | Status: DC

## 2019-10-26 ENCOUNTER — Ambulatory Visit (INDEPENDENT_AMBULATORY_CARE_PROVIDER_SITE_OTHER): Payer: Medicare Other | Admitting: *Deleted

## 2019-10-26 ENCOUNTER — Other Ambulatory Visit: Payer: Self-pay | Admitting: Internal Medicine

## 2019-10-26 DIAGNOSIS — I4891 Unspecified atrial fibrillation: Secondary | ICD-10-CM

## 2019-10-26 DIAGNOSIS — Z5181 Encounter for therapeutic drug level monitoring: Secondary | ICD-10-CM

## 2019-10-26 LAB — PROTIME-INR
INR: 3.9 — AB (ref 0.9–1.1)
INR: 3.9 — ABNORMAL HIGH
Prothrombin Time: 37.2 s — ABNORMAL HIGH (ref 9.0–11.5)

## 2019-10-26 NOTE — Patient Instructions (Signed)
Description   Spoke with pt and instructed him to hold today's dose then continue same dosage 1/2 tablet daily except 1 tablet on Sundays and Thursdays. Recheck in 3 weeks. Orders faxed to [facility] at [phone].

## 2019-11-08 ENCOUNTER — Other Ambulatory Visit: Payer: Self-pay | Admitting: Cardiovascular Disease

## 2019-11-08 ENCOUNTER — Other Ambulatory Visit: Payer: Self-pay | Admitting: Internal Medicine

## 2019-11-16 ENCOUNTER — Other Ambulatory Visit: Payer: Self-pay

## 2019-11-16 ENCOUNTER — Other Ambulatory Visit: Payer: Self-pay | Admitting: Interventional Cardiology

## 2019-11-16 ENCOUNTER — Ambulatory Visit (INDEPENDENT_AMBULATORY_CARE_PROVIDER_SITE_OTHER): Payer: Medicare Other | Admitting: Interventional Cardiology

## 2019-11-16 DIAGNOSIS — Z5181 Encounter for therapeutic drug level monitoring: Secondary | ICD-10-CM | POA: Diagnosis not present

## 2019-11-16 DIAGNOSIS — E039 Hypothyroidism, unspecified: Secondary | ICD-10-CM | POA: Diagnosis not present

## 2019-11-16 DIAGNOSIS — I4821 Permanent atrial fibrillation: Secondary | ICD-10-CM

## 2019-11-16 DIAGNOSIS — I4891 Unspecified atrial fibrillation: Secondary | ICD-10-CM

## 2019-11-16 LAB — PROTIME-INR
INR: 3.2 — AB (ref 0.9–1.1)
INR: 3.2 — ABNORMAL HIGH
Prothrombin Time: 30.9 s — ABNORMAL HIGH (ref 9.0–11.5)

## 2019-11-17 DIAGNOSIS — B351 Tinea unguium: Secondary | ICD-10-CM | POA: Diagnosis not present

## 2019-11-17 DIAGNOSIS — M2041 Other hammer toe(s) (acquired), right foot: Secondary | ICD-10-CM | POA: Diagnosis not present

## 2019-11-17 DIAGNOSIS — M2042 Other hammer toe(s) (acquired), left foot: Secondary | ICD-10-CM | POA: Diagnosis not present

## 2019-11-17 LAB — CBC WITH DIFFERENTIAL/PLATELET
Absolute Monocytes: 605 cells/uL (ref 200–950)
Basophils Absolute: 72 cells/uL (ref 0–200)
Basophils Relative: 1.3 %
Eosinophils Absolute: 270 cells/uL (ref 15–500)
Eosinophils Relative: 4.9 %
HCT: 34 % — ABNORMAL LOW (ref 38.5–50.0)
Hemoglobin: 11.1 g/dL — ABNORMAL LOW (ref 13.2–17.1)
Lymphs Abs: 1705 cells/uL (ref 850–3900)
MCH: 29.6 pg (ref 27.0–33.0)
MCHC: 32.6 g/dL (ref 32.0–36.0)
MCV: 90.7 fL (ref 80.0–100.0)
MPV: 11.5 fL (ref 7.5–12.5)
Monocytes Relative: 11 %
Neutro Abs: 2849 cells/uL (ref 1500–7800)
Neutrophils Relative %: 51.8 %
Platelets: 127 10*3/uL — ABNORMAL LOW (ref 140–400)
RBC: 3.75 10*6/uL — ABNORMAL LOW (ref 4.20–5.80)
RDW: 13.8 % (ref 11.0–15.0)
Total Lymphocyte: 31 %
WBC: 5.5 10*3/uL (ref 3.8–10.8)

## 2019-11-17 LAB — TSH: TSH: 2.32 mIU/L (ref 0.40–4.50)

## 2019-11-17 LAB — COMPLETE METABOLIC PANEL WITH GFR
AG Ratio: 1.4 (calc) (ref 1.0–2.5)
ALT: 12 U/L (ref 9–46)
AST: 17 U/L (ref 10–35)
Albumin: 3.9 g/dL (ref 3.6–5.1)
Alkaline phosphatase (APISO): 74 U/L (ref 35–144)
BUN/Creatinine Ratio: 21 (calc) (ref 6–22)
BUN: 55 mg/dL — ABNORMAL HIGH (ref 7–25)
CO2: 29 mmol/L (ref 20–32)
Calcium: 8.6 mg/dL (ref 8.6–10.3)
Chloride: 101 mmol/L (ref 98–110)
Creat: 2.57 mg/dL — ABNORMAL HIGH (ref 0.70–1.11)
GFR, Est African American: 23 mL/min/{1.73_m2} — ABNORMAL LOW (ref >=60)
GFR, Est Non African American: 20 mL/min/{1.73_m2} — ABNORMAL LOW (ref >=60)
Globulin: 2.7 g/dL (calc) (ref 1.9–3.7)
Glucose, Bld: 107 mg/dL — ABNORMAL HIGH (ref 65–99)
Potassium: 4 mmol/L (ref 3.5–5.3)
Sodium: 140 mmol/L (ref 135–146)
Total Bilirubin: 0.5 mg/dL (ref 0.2–1.2)
Total Protein: 6.6 g/dL (ref 6.1–8.1)

## 2019-11-22 ENCOUNTER — Encounter: Payer: Self-pay | Admitting: Internal Medicine

## 2019-11-22 ENCOUNTER — Other Ambulatory Visit: Payer: Self-pay

## 2019-11-22 ENCOUNTER — Non-Acute Institutional Stay: Payer: Medicare Other | Admitting: Internal Medicine

## 2019-11-22 VITALS — BP 120/66 | HR 74 | Temp 97.7°F | Ht 60.0 in | Wt 150.0 lb

## 2019-11-22 DIAGNOSIS — R609 Edema, unspecified: Secondary | ICD-10-CM | POA: Diagnosis not present

## 2019-11-22 DIAGNOSIS — L03032 Cellulitis of left toe: Secondary | ICD-10-CM

## 2019-11-22 DIAGNOSIS — I5032 Chronic diastolic (congestive) heart failure: Secondary | ICD-10-CM | POA: Diagnosis not present

## 2019-11-22 DIAGNOSIS — E039 Hypothyroidism, unspecified: Secondary | ICD-10-CM

## 2019-11-22 DIAGNOSIS — H6120 Impacted cerumen, unspecified ear: Secondary | ICD-10-CM

## 2019-11-22 DIAGNOSIS — I4821 Permanent atrial fibrillation: Secondary | ICD-10-CM

## 2019-11-22 DIAGNOSIS — H60391 Other infective otitis externa, right ear: Secondary | ICD-10-CM | POA: Diagnosis not present

## 2019-11-22 DIAGNOSIS — D631 Anemia in chronic kidney disease: Secondary | ICD-10-CM

## 2019-11-22 DIAGNOSIS — N184 Chronic kidney disease, stage 4 (severe): Secondary | ICD-10-CM | POA: Diagnosis not present

## 2019-11-22 MED ORDER — DOXYCYCLINE HYCLATE 100 MG PO TABS: 100.0000 mg | ORAL_TABLET | Freq: Two times a day (BID) | ORAL | 0 refills | Status: AC

## 2019-11-22 MED ORDER — CIPROFLOXACIN-DEXAMETHASONE 0.3-0.1 % OT SUSP: 4.0000 [drp] | Freq: Two times a day (BID) | OTIC | 0 refills | Status: AC
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
## 2019-11-23 DIAGNOSIS — L570 Actinic keratosis: Secondary | ICD-10-CM | POA: Diagnosis not present

## 2019-11-23 DIAGNOSIS — L853 Xerosis cutis: Secondary | ICD-10-CM | POA: Diagnosis not present

## 2019-11-23 DIAGNOSIS — L821 Other seborrheic keratosis: Secondary | ICD-10-CM | POA: Diagnosis not present

## 2019-11-23 DIAGNOSIS — L814 Other melanin hyperpigmentation: Secondary | ICD-10-CM | POA: Diagnosis not present

## 2019-11-23 DIAGNOSIS — D1801 Hemangioma of skin and subcutaneous tissue: Secondary | ICD-10-CM | POA: Diagnosis not present

## 2019-11-24 ENCOUNTER — Telehealth: Payer: Self-pay | Admitting: *Deleted

## 2019-11-24 NOTE — Telephone Encounter (Signed)
Called Bailey Mech [phone]) from [facility] and made her aware that pt will need to get his INR checked on 4/19.   Faxed order to [phone].

## 2019-11-24 NOTE — Telephone Encounter (Signed)
Pt called and stated that his Dr. started him on Doxycyline 100 mg BID for 5 days. Pt stated he had his first dose last night. Instructed pt to eat an extra serving of greens over the weekend and he will need to get his INR checked on Monday April 19th.

## 2019-11-27 ENCOUNTER — Ambulatory Visit (INDEPENDENT_AMBULATORY_CARE_PROVIDER_SITE_OTHER): Payer: Medicare Other | Admitting: *Deleted

## 2019-11-27 ENCOUNTER — Telehealth: Payer: Self-pay | Admitting: *Deleted

## 2019-11-27 ENCOUNTER — Other Ambulatory Visit: Payer: Self-pay | Admitting: Internal Medicine

## 2019-11-27 ENCOUNTER — Other Ambulatory Visit: Payer: Self-pay | Admitting: Interventional Cardiology

## 2019-11-27 DIAGNOSIS — I4891 Unspecified atrial fibrillation: Secondary | ICD-10-CM | POA: Diagnosis not present

## 2019-11-27 DIAGNOSIS — Z5181 Encounter for therapeutic drug level monitoring: Secondary | ICD-10-CM

## 2019-11-27 LAB — PROTIME-INR
INR: 2.6 — ABNORMAL HIGH
Prothrombin Time: 26.4 s — ABNORMAL HIGH (ref 9.0–11.5)

## 2019-11-27 LAB — POCT INR: INR: 2.6 (ref 2.0–3.0)

## 2019-11-27 MED ORDER — NEOMYCIN-POLYMYXIN-HC 3.5-10000-1 OT SOLN: 3.0000 [drp] | Freq: Four times a day (QID) | OTIC | 0 refills | Status: DC

## 2019-11-27 NOTE — Patient Instructions (Signed)
Description   Spoke with pt and instructed him to continue taking 1/2 tablet daily except 1 tablet on Sundays. Recheck in 1 week with wife appt. Orders faxed to [place] at [phone].

## 2019-11-27 NOTE — Telephone Encounter (Signed)
Patient called and stated that he was seen on 4/14 by Dr. Lyndel Safe for his ear and prescribed Ciprodex. His insurance will not cover the Ciprodex and it is going to cost him $200. Patient is requesting something cheaper please advise.   11/22/2019: Assessment/Plan Other infective acute otitis externa of right ear Ciprodex BID for 2 weeks Stop Debrox Reval in 4 weeks

## 2019-11-27 NOTE — Telephone Encounter (Signed)
Patient notified and agreed.  

## 2019-11-27 NOTE — Telephone Encounter (Signed)
I have Placed order for [facility]. Tell him to let us know if there is any issue

## 2019-11-30 ENCOUNTER — Other Ambulatory Visit: Payer: Self-pay | Admitting: *Deleted

## 2019-11-30 MED ORDER — DIAZEPAM 5 MG PO TABS: 5.0000 mg | ORAL_TABLET | Freq: Every day | ORAL | 3 refills | Status: DC

## 2019-11-30 NOTE — Telephone Encounter (Signed)
Received fax refill Request from [facility].  Pended Rx and sent to Dr. Lyndel Safe for approval.   NCCSRS Database Verified LR: 10/30/2019.

## 2019-12-01 ENCOUNTER — Other Ambulatory Visit: Payer: Self-pay | Admitting: Nurse Practitioner

## 2019-12-07 ENCOUNTER — Other Ambulatory Visit: Payer: Self-pay | Admitting: Interventional Cardiology

## 2019-12-07 ENCOUNTER — Ambulatory Visit (INDEPENDENT_AMBULATORY_CARE_PROVIDER_SITE_OTHER): Payer: Medicare Other | Admitting: *Deleted

## 2019-12-07 DIAGNOSIS — Z5181 Encounter for therapeutic drug level monitoring: Secondary | ICD-10-CM

## 2019-12-07 DIAGNOSIS — I4891 Unspecified atrial fibrillation: Secondary | ICD-10-CM

## 2019-12-07 LAB — PROTIME-INR
INR: 2.9 — AB (ref 0.9–1.1)
INR: 2.9 — ABNORMAL HIGH
Prothrombin Time: 29.5 s — ABNORMAL HIGH (ref 9.0–11.5)

## 2019-12-07 NOTE — Patient Instructions (Signed)
Description   Spoke with pt and instructed him to continue taking 1/2 tablet daily except 1 tablet on Sundays. Recheck in 1 week with wife appt. Orders faxed to [place] at [phone].

## 2019-12-20 ENCOUNTER — Non-Acute Institutional Stay: Payer: Medicare Other | Admitting: Internal Medicine

## 2019-12-20 ENCOUNTER — Encounter: Payer: Self-pay | Admitting: Internal Medicine

## 2019-12-20 ENCOUNTER — Other Ambulatory Visit: Payer: Self-pay

## 2019-12-20 VITALS — BP 114/68 | HR 78 | Temp 97.4°F | Ht 60.0 in | Wt 148.7 lb

## 2019-12-20 DIAGNOSIS — I4821 Permanent atrial fibrillation: Secondary | ICD-10-CM

## 2019-12-20 DIAGNOSIS — H6120 Impacted cerumen, unspecified ear: Secondary | ICD-10-CM

## 2019-12-20 DIAGNOSIS — N184 Chronic kidney disease, stage 4 (severe): Secondary | ICD-10-CM | POA: Diagnosis not present

## 2019-12-20 DIAGNOSIS — E782 Mixed hyperlipidemia: Secondary | ICD-10-CM

## 2019-12-20 DIAGNOSIS — I5032 Chronic diastolic (congestive) heart failure: Secondary | ICD-10-CM | POA: Diagnosis not present

## 2019-12-20 DIAGNOSIS — E039 Hypothyroidism, unspecified: Secondary | ICD-10-CM | POA: Diagnosis not present

## 2019-12-20 DIAGNOSIS — F411 Generalized anxiety disorder: Secondary | ICD-10-CM | POA: Diagnosis not present
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
## 2019-12-28 ENCOUNTER — Other Ambulatory Visit: Payer: Self-pay | Admitting: Interventional Cardiology

## 2019-12-28 ENCOUNTER — Ambulatory Visit (INDEPENDENT_AMBULATORY_CARE_PROVIDER_SITE_OTHER): Payer: Medicare Other

## 2019-12-28 DIAGNOSIS — I4891 Unspecified atrial fibrillation: Secondary | ICD-10-CM

## 2019-12-28 DIAGNOSIS — Z5181 Encounter for therapeutic drug level monitoring: Secondary | ICD-10-CM | POA: Diagnosis not present

## 2019-12-28 LAB — PROTIME-INR
INR: 1.9 — AB (ref <=1.1)
INR: 1.9 — ABNORMAL HIGH
Prothrombin Time: 19.9 s — ABNORMAL HIGH (ref 9.0–11.5)

## 2019-12-28 NOTE — Patient Instructions (Signed)
Description   Spoke with pt and instructed him to take 1 tablet today, then resume same dosage 1/2 tablet daily except 1 tablet on Sundays. Recheck in 3 weeks. Orders faxed to [facility] at [phone].

## 2020-01-08 ENCOUNTER — Other Ambulatory Visit: Payer: Self-pay | Admitting: Nurse Practitioner

## 2020-01-17 ENCOUNTER — Other Ambulatory Visit: Payer: Self-pay

## 2020-01-17 ENCOUNTER — Non-Acute Institutional Stay: Payer: Medicare Other | Admitting: Internal Medicine

## 2020-01-17 VITALS — BP 118/66 | HR 65 | Temp 98.0°F

## 2020-01-17 DIAGNOSIS — L03114 Cellulitis of left upper limb: Secondary | ICD-10-CM

## 2020-01-17 DIAGNOSIS — M7989 Other specified soft tissue disorders: Secondary | ICD-10-CM

## 2020-01-17 MED ORDER — CEPHALEXIN 250 MG PO CAPS: 250.0000 mg | ORAL_CAPSULE | Freq: Four times a day (QID) | ORAL | 0 refills | Status: AC

## 2020-01-17 MED ORDER — DOXYCYCLINE HYCLATE 100 MG PO TABS: 100.0000 mg | ORAL_TABLET | Freq: Two times a day (BID) | ORAL | 0 refills | Status: DC
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
## 2020-01-18 ENCOUNTER — Encounter: Payer: Self-pay | Admitting: Internal Medicine

## 2020-01-18 ENCOUNTER — Other Ambulatory Visit: Payer: Self-pay | Admitting: Interventional Cardiology

## 2020-01-18 ENCOUNTER — Ambulatory Visit (INDEPENDENT_AMBULATORY_CARE_PROVIDER_SITE_OTHER): Payer: Medicare Other | Admitting: Pharmacist

## 2020-01-18 DIAGNOSIS — I4891 Unspecified atrial fibrillation: Secondary | ICD-10-CM | POA: Diagnosis not present

## 2020-01-18 DIAGNOSIS — Z5181 Encounter for therapeutic drug level monitoring: Secondary | ICD-10-CM | POA: Diagnosis not present

## 2020-01-18 LAB — PROTIME-INR
INR: 1.7 — ABNORMAL HIGH
Prothrombin Time: 17.3 s — ABNORMAL HIGH (ref 9.0–11.5)

## 2020-01-22 ENCOUNTER — Other Ambulatory Visit: Payer: Self-pay | Admitting: Interventional Cardiology

## 2020-01-22 ENCOUNTER — Other Ambulatory Visit: Payer: Self-pay | Admitting: Internal Medicine

## 2020-01-22 ENCOUNTER — Ambulatory Visit (INDEPENDENT_AMBULATORY_CARE_PROVIDER_SITE_OTHER): Payer: Medicare Other | Admitting: Interventional Cardiology

## 2020-01-22 DIAGNOSIS — I4891 Unspecified atrial fibrillation: Secondary | ICD-10-CM | POA: Diagnosis not present

## 2020-01-22 DIAGNOSIS — Z5181 Encounter for therapeutic drug level monitoring: Secondary | ICD-10-CM | POA: Diagnosis not present

## 2020-01-22 LAB — PROTIME-INR
INR: 1.6 — AB (ref 0.9–1.1)
INR: 1.6 — ABNORMAL HIGH
Prothrombin Time: 17 s — ABNORMAL HIGH (ref 9.0–11.5)

## 2020-01-22 NOTE — Patient Instructions (Signed)
Description   Spoke with pt and instructed him to take 1 tablet today then start taking 1/2 a tablet daily, except for 1 tablet on Sunday and Tuesdays. Recheck INR in 1.5 weeks. Orders faxed to [facility] at [phone].

## 2020-01-31 ENCOUNTER — Encounter: Payer: Self-pay | Admitting: Internal Medicine

## 2020-01-31 ENCOUNTER — Other Ambulatory Visit: Payer: Self-pay

## 2020-01-31 ENCOUNTER — Non-Acute Institutional Stay: Payer: Medicare Other | Admitting: Internal Medicine

## 2020-01-31 VITALS — BP 108/58 | HR 74 | Temp 97.5°F | Ht 60.0 in | Wt 148.2 lb

## 2020-01-31 DIAGNOSIS — I4821 Permanent atrial fibrillation: Secondary | ICD-10-CM

## 2020-01-31 DIAGNOSIS — R609 Edema, unspecified: Secondary | ICD-10-CM | POA: Diagnosis not present

## 2020-01-31 DIAGNOSIS — E782 Mixed hyperlipidemia: Secondary | ICD-10-CM

## 2020-01-31 DIAGNOSIS — F411 Generalized anxiety disorder: Secondary | ICD-10-CM | POA: Diagnosis not present

## 2020-01-31 DIAGNOSIS — N184 Chronic kidney disease, stage 4 (severe): Secondary | ICD-10-CM | POA: Diagnosis not present
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
## 2020-02-01 ENCOUNTER — Other Ambulatory Visit: Payer: Self-pay | Admitting: Interventional Cardiology

## 2020-02-01 ENCOUNTER — Ambulatory Visit (INDEPENDENT_AMBULATORY_CARE_PROVIDER_SITE_OTHER): Payer: Medicare Other | Admitting: Cardiology

## 2020-02-01 DIAGNOSIS — Z5181 Encounter for therapeutic drug level monitoring: Secondary | ICD-10-CM

## 2020-02-01 DIAGNOSIS — I4891 Unspecified atrial fibrillation: Secondary | ICD-10-CM

## 2020-02-01 LAB — PROTIME-INR
INR: 2 — AB (ref 0.9–1.1)
INR: 2 — ABNORMAL HIGH
Prothrombin Time: 21.1 s — ABNORMAL HIGH (ref 9.0–11.5)

## 2020-02-01 NOTE — Patient Instructions (Signed)
Spoke with pt and instructed him to continue taking 1/2 a tablet daily, except for 1 tablet on Sunday and Tuesdays. Recheck INR in 1 week. Orders faxed to [facility] at [phone].

## 2020-02-08 ENCOUNTER — Telehealth: Payer: Self-pay

## 2020-02-08 NOTE — Telephone Encounter (Signed)
Pt is in Coumadin Clinic f/u book to have INR drawn on 02/08/2020.  I called pt to retrieve the result and he stated that when he went for blood work they didn't have the order.  [facility] for pt to have INR drawn on 02/15/2020. Pt's name placed in the coumadin clinic follow up book.

## 2020-02-15 ENCOUNTER — Other Ambulatory Visit: Payer: Self-pay | Admitting: Cardiovascular Disease

## 2020-02-15 ENCOUNTER — Ambulatory Visit (INDEPENDENT_AMBULATORY_CARE_PROVIDER_SITE_OTHER): Payer: Medicare Other | Admitting: *Deleted

## 2020-02-15 DIAGNOSIS — Z5181 Encounter for therapeutic drug level monitoring: Secondary | ICD-10-CM | POA: Diagnosis not present

## 2020-02-15 DIAGNOSIS — I4891 Unspecified atrial fibrillation: Secondary | ICD-10-CM

## 2020-02-15 LAB — PROTIME-INR
INR: 2.6 — AB (ref 0.9–1.1)
INR: 2.6 — ABNORMAL HIGH
Prothrombin Time: 26.8 s — ABNORMAL HIGH (ref 9.0–11.5)

## 2020-02-19 ENCOUNTER — Telehealth: Payer: Self-pay | Admitting: Internal Medicine

## 2020-02-19 NOTE — Telephone Encounter (Signed)
I will glad to see him in clinic, also let him know Dr. Lyndel Safe is on vacation.

## 2020-02-19 NOTE — Telephone Encounter (Signed)
Patient called and stated that he is having left leg pain and can barely walk.  He was offered an appointment with Man Darlina Rumpf for 02/20/20. He wanted to me to  give Dr. Lyndel Safe this message and let her advise him as to what to do.    Thank you   Adan Sis

## 2020-02-19 NOTE — Telephone Encounter (Signed)
Dr. Lyndel Safe is not in today. Routing to [facility].

## 2020-02-20 ENCOUNTER — Other Ambulatory Visit: Payer: Self-pay

## 2020-02-20 ENCOUNTER — Encounter: Payer: Self-pay | Admitting: Nurse Practitioner

## 2020-02-20 ENCOUNTER — Encounter: Payer: Medicare Other | Admitting: Nurse Practitioner

## 2020-02-27 NOTE — Telephone Encounter (Signed)
Called patient. Patient states he does not need to be seen. His foot is still a bit sore but he is sure if he continues to rest he will be much better soon.

## 2020-03-03 NOTE — Progress Notes (Signed)
This encounter was created in error - please disregard.

## 2020-03-07 ENCOUNTER — Ambulatory Visit (INDEPENDENT_AMBULATORY_CARE_PROVIDER_SITE_OTHER): Payer: Medicare Other

## 2020-03-07 DIAGNOSIS — D684 Acquired coagulation factor deficiency: Secondary | ICD-10-CM | POA: Diagnosis not present

## 2020-03-07 DIAGNOSIS — Z5181 Encounter for therapeutic drug level monitoring: Secondary | ICD-10-CM | POA: Diagnosis not present

## 2020-03-07 DIAGNOSIS — I4891 Unspecified atrial fibrillation: Secondary | ICD-10-CM

## 2020-03-07 DIAGNOSIS — I10 Essential (primary) hypertension: Secondary | ICD-10-CM | POA: Diagnosis not present

## 2020-03-07 DIAGNOSIS — Z7901 Long term (current) use of anticoagulants: Secondary | ICD-10-CM | POA: Diagnosis not present

## 2020-03-07 DIAGNOSIS — I2510 Atherosclerotic heart disease of native coronary artery without angina pectoris: Secondary | ICD-10-CM | POA: Diagnosis not present

## 2020-03-07 LAB — PROTIME-INR: INR: 3 — AB (ref 0.9–1.1)

## 2020-03-07 NOTE — Patient Instructions (Signed)
Description   Spoke with pt and instructed him to continue taking 1/2 tablet daily except for 1 tablet on Sunday and Tuesdays. Recheck INR in 4 weeks. Orders faxed to [facility] at [phone].

## 2020-03-12 ENCOUNTER — Other Ambulatory Visit: Payer: Self-pay

## 2020-03-12 ENCOUNTER — Encounter: Payer: Self-pay | Admitting: Nurse Practitioner

## 2020-03-12 ENCOUNTER — Non-Acute Institutional Stay: Payer: Medicare Other | Admitting: Nurse Practitioner

## 2020-03-12 DIAGNOSIS — Z7689 Persons encountering health services in other specified circumstances: Secondary | ICD-10-CM | POA: Diagnosis not present

## 2020-03-12 DIAGNOSIS — E039 Hypothyroidism, unspecified: Secondary | ICD-10-CM

## 2020-03-12 DIAGNOSIS — D638 Anemia in other chronic diseases classified elsewhere: Secondary | ICD-10-CM | POA: Diagnosis not present

## 2020-03-12 DIAGNOSIS — N184 Chronic kidney disease, stage 4 (severe): Secondary | ICD-10-CM

## 2020-03-12 DIAGNOSIS — F411 Generalized anxiety disorder: Secondary | ICD-10-CM

## 2020-03-12 DIAGNOSIS — R531 Weakness: Secondary | ICD-10-CM | POA: Diagnosis not present

## 2020-03-12 DIAGNOSIS — I4891 Unspecified atrial fibrillation: Secondary | ICD-10-CM

## 2020-03-12 DIAGNOSIS — I5032 Chronic diastolic (congestive) heart failure: Secondary | ICD-10-CM

## 2020-03-12 DIAGNOSIS — K219 Gastro-esophageal reflux disease without esophagitis: Secondary | ICD-10-CM | POA: Diagnosis not present

## 2020-03-12 DIAGNOSIS — K5901 Slow transit constipation: Secondary | ICD-10-CM | POA: Diagnosis not present

## 2020-03-12 NOTE — Assessment & Plan Note (Signed)
Stable, continue Omeprazole.  

## 2020-03-12 NOTE — Assessment & Plan Note (Signed)
Creat 2.57, eGFR 20 11/16/19
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
## 2020-03-12 NOTE — Assessment & Plan Note (Signed)
Heart rate is in control, continue Metoprolol, Coumadin.  

## 2020-03-12 NOTE — Assessment & Plan Note (Signed)
Stable

## 2020-03-12 NOTE — Assessment & Plan Note (Signed)
power chair evaluation. The patient has chronic osteoarthritic pain in the lower back, left hip, knees that he needs wheelchair to get around. The patient lived in [address] apartment at [facility], travels about 7/10 mile to dinning room for meals. His generalized weakness is progressing that he is not able to self propel wheelchair for the above stated distance back and forth 3 times a day. Afib, chronic congestive heart failure, advanced osteoarthritis in the lower back, left hip, and knees, stage 4 kidney disease are all contributory to his generalized weakness which will get worse likely.

## 2020-03-12 NOTE — Assessment & Plan Note (Signed)
Stable, continue Levothyroxine.  

## 2020-03-12 NOTE — Assessment & Plan Note (Signed)
Stable, continue Valium

## 2020-03-12 NOTE — Assessment & Plan Note (Signed)
power chair evaluation. The patient has chronic osteoarthritic pain in the lower back, left hip, knees that he needs wheelchair to get around. The patient lived in [place] apartment at [facility], travels about 7/10 mile to dinning room for meals. His generalized weakness is progressing that he is no longer able  to self propel wheelchair for the above stated distance back and forth 3 times a day. Afib, chronic congestive heart failure, advanced osteoarthritis in the lower back, left hip, and knees, stage 4 kidney disease are all contributory to his generalized weakness which will get worse likely. A power wheelchair will enable the patient to stay independently and able to go to dinning room for meals.

## 2020-03-12 NOTE — Assessment & Plan Note (Signed)
Stable, continue Senna

## 2020-03-12 NOTE — Assessment & Plan Note (Signed)
Compensated, no apparent edema BLE, continue Furosemide.

## 2020-03-20 DIAGNOSIS — S329XXA Fracture of unspecified parts of lumbosacral spine and pelvis, initial encounter for closed fracture: Secondary | ICD-10-CM | POA: Diagnosis not present

## 2020-03-20 DIAGNOSIS — K250 Acute gastric ulcer with hemorrhage: Secondary | ICD-10-CM | POA: Diagnosis not present

## 2020-03-20 DIAGNOSIS — I10 Essential (primary) hypertension: Secondary | ICD-10-CM | POA: Diagnosis not present

## 2020-03-20 DIAGNOSIS — H2510 Age-related nuclear cataract, unspecified eye: Secondary | ICD-10-CM | POA: Diagnosis not present

## 2020-03-20 DIAGNOSIS — Z951 Presence of aortocoronary bypass graft: Secondary | ICD-10-CM | POA: Diagnosis not present

## 2020-03-20 DIAGNOSIS — M6281 Muscle weakness (generalized): Secondary | ICD-10-CM | POA: Diagnosis not present

## 2020-03-20 DIAGNOSIS — I2510 Atherosclerotic heart disease of native coronary artery without angina pectoris: Secondary | ICD-10-CM | POA: Diagnosis not present

## 2020-03-20 DIAGNOSIS — Z96659 Presence of unspecified artificial knee joint: Secondary | ICD-10-CM | POA: Diagnosis not present

## 2020-03-23 ENCOUNTER — Other Ambulatory Visit: Payer: Self-pay | Admitting: Nurse Practitioner

## 2020-03-28 DIAGNOSIS — Z951 Presence of aortocoronary bypass graft: Secondary | ICD-10-CM | POA: Diagnosis not present

## 2020-03-28 DIAGNOSIS — I10 Essential (primary) hypertension: Secondary | ICD-10-CM | POA: Diagnosis not present

## 2020-03-28 DIAGNOSIS — K250 Acute gastric ulcer with hemorrhage: Secondary | ICD-10-CM | POA: Diagnosis not present

## 2020-03-28 DIAGNOSIS — I2510 Atherosclerotic heart disease of native coronary artery without angina pectoris: Secondary | ICD-10-CM | POA: Diagnosis not present

## 2020-03-28 DIAGNOSIS — S329XXA Fracture of unspecified parts of lumbosacral spine and pelvis, initial encounter for closed fracture: Secondary | ICD-10-CM | POA: Diagnosis not present

## 2020-03-28 DIAGNOSIS — M6281 Muscle weakness (generalized): Secondary | ICD-10-CM | POA: Diagnosis not present

## 2020-04-02 ENCOUNTER — Telehealth: Payer: Self-pay | Admitting: *Deleted

## 2020-04-02 NOTE — Telephone Encounter (Signed)
Patient daughter, [name] called and stated that patient's other daughter passed away back in 11/10/22.  Stated that nothing was never given for depression and patient is going down hill fast with depression.  Daughter is wanting to know if something can be prescribed for this.  Stated that patient is unable to walk well to come to an appointment.  Please Advise.

## 2020-04-03 DIAGNOSIS — M6281 Muscle weakness (generalized): Secondary | ICD-10-CM | POA: Diagnosis not present

## 2020-04-03 DIAGNOSIS — Z951 Presence of aortocoronary bypass graft: Secondary | ICD-10-CM | POA: Diagnosis not present

## 2020-04-03 DIAGNOSIS — K250 Acute gastric ulcer with hemorrhage: Secondary | ICD-10-CM | POA: Diagnosis not present

## 2020-04-03 DIAGNOSIS — S329XXA Fracture of unspecified parts of lumbosacral spine and pelvis, initial encounter for closed fracture: Secondary | ICD-10-CM | POA: Diagnosis not present

## 2020-04-03 DIAGNOSIS — I2510 Atherosclerotic heart disease of native coronary artery without angina pectoris: Secondary | ICD-10-CM | POA: Diagnosis not present

## 2020-04-03 DIAGNOSIS — I10 Essential (primary) hypertension: Secondary | ICD-10-CM | POA: Diagnosis not present

## 2020-04-04 ENCOUNTER — Other Ambulatory Visit: Payer: Self-pay | Admitting: Cardiovascular Disease

## 2020-04-04 ENCOUNTER — Ambulatory Visit (INDEPENDENT_AMBULATORY_CARE_PROVIDER_SITE_OTHER): Payer: Medicare Other

## 2020-04-04 DIAGNOSIS — I4891 Unspecified atrial fibrillation: Secondary | ICD-10-CM | POA: Diagnosis not present

## 2020-04-04 DIAGNOSIS — Z7689 Persons encountering health services in other specified circumstances: Secondary | ICD-10-CM

## 2020-04-04 DIAGNOSIS — Z5181 Encounter for therapeutic drug level monitoring: Secondary | ICD-10-CM | POA: Diagnosis not present

## 2020-04-04 LAB — PROTIME-INR
INR: 3.5 — AB (ref 0.9–1.1)
INR: 3.5 — ABNORMAL HIGH
Prothrombin Time: 35 s — ABNORMAL HIGH (ref 9.0–11.5)

## 2020-04-04 NOTE — Telephone Encounter (Signed)
Routed message to Dr. Gupta.

## 2020-04-04 NOTE — Telephone Encounter (Signed)
Patient daughter notified and agreed. Will try to come to patient's next appointment.

## 2020-04-04 NOTE — Patient Instructions (Signed)
Description   Spoke with pt and instructed him to skip today's dosage of Warfarin, then resume same dosage 1/2 tablet daily except for 1 tablet on Sundays and Tuesdays. Recheck INR in 2 weeks. Orders faxed to [place] at [phone].

## 2020-04-04 NOTE — Telephone Encounter (Signed)
LMOM to return call.

## 2020-04-04 NOTE — Telephone Encounter (Signed)
Please let her know that I had offered him many times to start Antidepressant but he refuses it. I have to see him to Prescribe Med. He need follow up for his Kidneys and LE edema also.

## 2020-04-07 ENCOUNTER — Other Ambulatory Visit: Payer: Self-pay | Admitting: Internal Medicine

## 2020-04-07 ENCOUNTER — Other Ambulatory Visit: Payer: Self-pay | Admitting: Nurse Practitioner

## 2020-04-07 ENCOUNTER — Other Ambulatory Visit: Payer: Self-pay | Admitting: Cardiovascular Disease

## 2020-04-09 DIAGNOSIS — I10 Essential (primary) hypertension: Secondary | ICD-10-CM | POA: Diagnosis not present

## 2020-04-09 DIAGNOSIS — S329XXA Fracture of unspecified parts of lumbosacral spine and pelvis, initial encounter for closed fracture: Secondary | ICD-10-CM | POA: Diagnosis not present

## 2020-04-09 DIAGNOSIS — K250 Acute gastric ulcer with hemorrhage: Secondary | ICD-10-CM | POA: Diagnosis not present

## 2020-04-09 DIAGNOSIS — M6281 Muscle weakness (generalized): Secondary | ICD-10-CM | POA: Diagnosis not present

## 2020-04-09 DIAGNOSIS — Z951 Presence of aortocoronary bypass graft: Secondary | ICD-10-CM | POA: Diagnosis not present

## 2020-04-09 DIAGNOSIS — I2510 Atherosclerotic heart disease of native coronary artery without angina pectoris: Secondary | ICD-10-CM | POA: Diagnosis not present

## 2020-04-10 ENCOUNTER — Encounter: Payer: Self-pay | Admitting: Internal Medicine

## 2020-04-10 ENCOUNTER — Non-Acute Institutional Stay: Payer: Medicare Other | Admitting: Internal Medicine

## 2020-04-10 ENCOUNTER — Other Ambulatory Visit: Payer: Self-pay

## 2020-04-10 VITALS — BP 108/64 | HR 89 | Temp 97.5°F | Ht 61.0 in | Wt 142.0 lb

## 2020-04-10 DIAGNOSIS — J3489 Other specified disorders of nose and nasal sinuses: Secondary | ICD-10-CM

## 2020-04-10 DIAGNOSIS — N184 Chronic kidney disease, stage 4 (severe): Secondary | ICD-10-CM

## 2020-04-10 DIAGNOSIS — F32A Depression, unspecified: Secondary | ICD-10-CM

## 2020-04-10 DIAGNOSIS — F329 Major depressive disorder, single episode, unspecified: Secondary | ICD-10-CM | POA: Diagnosis not present

## 2020-04-10 DIAGNOSIS — S329XXA Fracture of unspecified parts of lumbosacral spine and pelvis, initial encounter for closed fracture: Secondary | ICD-10-CM | POA: Diagnosis not present

## 2020-04-10 DIAGNOSIS — I2510 Atherosclerotic heart disease of native coronary artery without angina pectoris: Secondary | ICD-10-CM | POA: Diagnosis not present

## 2020-04-10 DIAGNOSIS — Z96659 Presence of unspecified artificial knee joint: Secondary | ICD-10-CM | POA: Diagnosis not present

## 2020-04-10 DIAGNOSIS — M6281 Muscle weakness (generalized): Secondary | ICD-10-CM | POA: Diagnosis not present

## 2020-04-10 DIAGNOSIS — I5032 Chronic diastolic (congestive) heart failure: Secondary | ICD-10-CM | POA: Diagnosis not present

## 2020-04-10 DIAGNOSIS — K250 Acute gastric ulcer with hemorrhage: Secondary | ICD-10-CM | POA: Diagnosis not present

## 2020-04-10 DIAGNOSIS — Z951 Presence of aortocoronary bypass graft: Secondary | ICD-10-CM | POA: Diagnosis not present

## 2020-04-10 DIAGNOSIS — H6120 Impacted cerumen, unspecified ear: Secondary | ICD-10-CM | POA: Diagnosis not present

## 2020-04-10 DIAGNOSIS — I10 Essential (primary) hypertension: Secondary | ICD-10-CM | POA: Diagnosis not present

## 2020-04-10 DIAGNOSIS — H2510 Age-related nuclear cataract, unspecified eye: Secondary | ICD-10-CM | POA: Diagnosis not present
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
## 2020-04-10 NOTE — Patient Instructions (Signed)
Start on Sertraline 12.5 mg QD for 1 week and then increase it to 25 mg QD. Chest Xray by Next week

## 2020-04-12 DIAGNOSIS — Z951 Presence of aortocoronary bypass graft: Secondary | ICD-10-CM | POA: Diagnosis not present

## 2020-04-12 DIAGNOSIS — S329XXA Fracture of unspecified parts of lumbosacral spine and pelvis, initial encounter for closed fracture: Secondary | ICD-10-CM | POA: Diagnosis not present

## 2020-04-12 DIAGNOSIS — I2510 Atherosclerotic heart disease of native coronary artery without angina pectoris: Secondary | ICD-10-CM | POA: Diagnosis not present

## 2020-04-12 DIAGNOSIS — I10 Essential (primary) hypertension: Secondary | ICD-10-CM | POA: Diagnosis not present

## 2020-04-12 DIAGNOSIS — K250 Acute gastric ulcer with hemorrhage: Secondary | ICD-10-CM | POA: Diagnosis not present

## 2020-04-12 DIAGNOSIS — M6281 Muscle weakness (generalized): Secondary | ICD-10-CM | POA: Diagnosis not present

## 2020-04-16 DIAGNOSIS — M6281 Muscle weakness (generalized): Secondary | ICD-10-CM | POA: Diagnosis not present

## 2020-04-16 DIAGNOSIS — K250 Acute gastric ulcer with hemorrhage: Secondary | ICD-10-CM | POA: Diagnosis not present

## 2020-04-16 DIAGNOSIS — I2510 Atherosclerotic heart disease of native coronary artery without angina pectoris: Secondary | ICD-10-CM | POA: Diagnosis not present

## 2020-04-16 DIAGNOSIS — S329XXA Fracture of unspecified parts of lumbosacral spine and pelvis, initial encounter for closed fracture: Secondary | ICD-10-CM | POA: Diagnosis not present

## 2020-04-16 DIAGNOSIS — Z951 Presence of aortocoronary bypass graft: Secondary | ICD-10-CM | POA: Diagnosis not present

## 2020-04-16 DIAGNOSIS — I10 Essential (primary) hypertension: Secondary | ICD-10-CM | POA: Diagnosis not present

## 2020-04-17 ENCOUNTER — Ambulatory Visit
Admission: RE | Admit: 2020-04-17 | Discharge: 2020-04-17 | Disposition: A | Discharge status: Home / Self Care | Payer: Medicare Other | Source: Ambulatory Visit | Attending: Internal Medicine | Admitting: Internal Medicine

## 2020-04-17 DIAGNOSIS — E782 Mixed hyperlipidemia: Secondary | ICD-10-CM | POA: Diagnosis not present

## 2020-04-17 DIAGNOSIS — I5032 Chronic diastolic (congestive) heart failure: Secondary | ICD-10-CM

## 2020-04-17 DIAGNOSIS — R05 Cough: Secondary | ICD-10-CM | POA: Diagnosis not present

## 2020-04-17 DIAGNOSIS — I4821 Permanent atrial fibrillation: Secondary | ICD-10-CM | POA: Diagnosis not present

## 2020-04-18 ENCOUNTER — Other Ambulatory Visit: Payer: Self-pay

## 2020-04-18 ENCOUNTER — Ambulatory Visit (INDEPENDENT_AMBULATORY_CARE_PROVIDER_SITE_OTHER): Payer: Medicare Other | Admitting: Pharmacist

## 2020-04-18 ENCOUNTER — Other Ambulatory Visit: Payer: Self-pay | Admitting: Cardiovascular Disease

## 2020-04-18 DIAGNOSIS — Z5181 Encounter for therapeutic drug level monitoring: Secondary | ICD-10-CM | POA: Diagnosis not present

## 2020-04-18 DIAGNOSIS — Z7689 Persons encountering health services in other specified circumstances: Secondary | ICD-10-CM

## 2020-04-18 DIAGNOSIS — I4891 Unspecified atrial fibrillation: Secondary | ICD-10-CM | POA: Diagnosis not present

## 2020-04-18 DIAGNOSIS — I4821 Permanent atrial fibrillation: Secondary | ICD-10-CM

## 2020-04-18 DIAGNOSIS — Z7901 Long term (current) use of anticoagulants: Secondary | ICD-10-CM

## 2020-04-18 DIAGNOSIS — E782 Mixed hyperlipidemia: Secondary | ICD-10-CM

## 2020-04-18 LAB — COMPLETE METABOLIC PANEL WITH GFR
AG Ratio: 1.1 (calc) (ref 1.0–2.5)
ALT: 10 U/L (ref 9–46)
AST: 17 U/L (ref 10–35)
Albumin: 3.4 g/dL — ABNORMAL LOW (ref 3.6–5.1)
Alkaline phosphatase (APISO): 80 U/L (ref 35–144)
BUN/Creatinine Ratio: 24 (calc) — ABNORMAL HIGH (ref 6–22)
BUN: 55 mg/dL — ABNORMAL HIGH (ref 7–25)
CO2: 32 mmol/L (ref 20–32)
Calcium: 8.7 mg/dL (ref 8.6–10.3)
Chloride: 103 mmol/L (ref 98–110)
Creat: 2.31 mg/dL — ABNORMAL HIGH (ref 0.70–1.11)
GFR, Est African American: 26 mL/min/{1.73_m2} — ABNORMAL LOW (ref >=60)
GFR, Est Non African American: 23 mL/min/{1.73_m2} — ABNORMAL LOW (ref >=60)
Globulin: 3 g/dL (calc) (ref 1.9–3.7)
Glucose, Bld: 85 mg/dL (ref 65–99)
Potassium: 3.9 mmol/L (ref 3.5–5.3)
Sodium: 142 mmol/L (ref 135–146)
Total Bilirubin: 0.6 mg/dL (ref 0.2–1.2)
Total Protein: 6.4 g/dL (ref 6.1–8.1)

## 2020-04-18 LAB — CBC WITH DIFFERENTIAL/PLATELET
Absolute Monocytes: 553 cells/uL (ref 200–950)
Basophils Absolute: 80 cells/uL (ref 0–200)
Basophils Relative: 1.4 %
Eosinophils Absolute: 365 cells/uL (ref 15–500)
Eosinophils Relative: 6.4 %
HCT: 31.7 % — ABNORMAL LOW (ref 38.5–50.0)
Hemoglobin: 10.3 g/dL — ABNORMAL LOW (ref 13.2–17.1)
Lymphs Abs: 1482 cells/uL (ref 850–3900)
MCH: 30 pg (ref 27.0–33.0)
MCHC: 32.5 g/dL (ref 32.0–36.0)
MCV: 92.4 fL (ref 80.0–100.0)
MPV: 11.1 fL (ref 7.5–12.5)
Monocytes Relative: 9.7 %
Neutro Abs: 3221 cells/uL (ref 1500–7800)
Neutrophils Relative %: 56.5 %
Platelets: 201 10*3/uL (ref 140–400)
RBC: 3.43 10*6/uL — ABNORMAL LOW (ref 4.20–5.80)
RDW: 13.2 % (ref 11.0–15.0)
Total Lymphocyte: 26 %
WBC: 5.7 10*3/uL (ref 3.8–10.8)

## 2020-04-18 LAB — LIPID PANEL
Cholesterol: 132 mg/dL (ref <200)
HDL: 46 mg/dL (ref >=40)
LDL Cholesterol (Calc): 72 mg/dL (calc)
Non-HDL Cholesterol (Calc): 86 mg/dL (calc) (ref <130)
Total CHOL/HDL Ratio: 2.9 (calc) (ref <5.0)
Triglycerides: 64 mg/dL (ref <150)

## 2020-04-18 LAB — PROTIME-INR
INR: 3.6 — ABNORMAL HIGH
INR: 3.6 — AB (ref 0.9–1.1)
Prothrombin Time: 36.1 s — ABNORMAL HIGH (ref 9.0–11.5)

## 2020-04-18 NOTE — Patient Instructions (Signed)
Description   Spoke with pt and instructed him to skip today's dosage of Warfarin, then take 1/2 tablet every day except for 1 tablet on Tuesdays.  Recheck INR in 2 weeks. Orders faxed to [place] at [phone].
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
## 2020-04-19 ENCOUNTER — Telehealth (INDEPENDENT_AMBULATORY_CARE_PROVIDER_SITE_OTHER): Payer: Medicare Other | Admitting: Physician Assistant

## 2020-04-19 ENCOUNTER — Other Ambulatory Visit: Payer: Self-pay

## 2020-04-19 ENCOUNTER — Encounter: Payer: Self-pay | Admitting: Physician Assistant

## 2020-04-19 VITALS — BP 127/72 | HR 89 | Ht 60.0 in | Wt 144.0 lb

## 2020-04-19 DIAGNOSIS — N184 Chronic kidney disease, stage 4 (severe): Secondary | ICD-10-CM

## 2020-04-19 DIAGNOSIS — I77810 Thoracic aortic ectasia: Secondary | ICD-10-CM | POA: Diagnosis not present

## 2020-04-19 DIAGNOSIS — I2510 Atherosclerotic heart disease of native coronary artery without angina pectoris: Secondary | ICD-10-CM

## 2020-04-19 DIAGNOSIS — I5032 Chronic diastolic (congestive) heart failure: Secondary | ICD-10-CM

## 2020-04-19 DIAGNOSIS — I4821 Permanent atrial fibrillation: Secondary | ICD-10-CM | POA: Diagnosis not present

## 2020-04-19 DIAGNOSIS — J849 Interstitial pulmonary disease, unspecified: Secondary | ICD-10-CM | POA: Diagnosis not present

## 2020-04-19 NOTE — Patient Instructions (Signed)
Medication Instructions:  Your physician recommends that you continue on your current medications as directed. Please refer to the Current Medication list given to you today.  *If you need a refill on your cardiac medications before your next appointment, please call your pharmacy*  Lab Work: None ordered today  Testing/Procedures: None ordered today  Follow-Up: At [facility], you and your health needs are our priority.  As part of our continuing mission to provide you with exceptional heart care, we have created designated Provider Care Teams.  These Care Teams include your primary Cardiologist (physician) and Advanced Practice Providers (APPs -  Physician Assistants and Nurse Practitioners) who all work together to provide you with the care you need, when you need it.  We recommend signing up for the patient portal called "MyChart".  Sign up information is provided on this After Visit Summary.  MyChart is used to connect with patients for Virtual Visits (Telemedicine).  Patients are able to view lab/test results, encounter notes, upcoming appointments, etc.  Non-urgent messages can be sent to your provider as well.   To learn more about what you can do with MyChart, go to [email or web address].    Your next appointment:   6 month(s)  The format for your next appointment:   Virtual Visit   Provider:   Richardson Dopp, PA-C

## 2020-04-22 DIAGNOSIS — I2510 Atherosclerotic heart disease of native coronary artery without angina pectoris: Secondary | ICD-10-CM | POA: Diagnosis not present

## 2020-04-22 DIAGNOSIS — S329XXA Fracture of unspecified parts of lumbosacral spine and pelvis, initial encounter for closed fracture: Secondary | ICD-10-CM | POA: Diagnosis not present

## 2020-04-22 DIAGNOSIS — K250 Acute gastric ulcer with hemorrhage: Secondary | ICD-10-CM | POA: Diagnosis not present

## 2020-04-22 DIAGNOSIS — Z951 Presence of aortocoronary bypass graft: Secondary | ICD-10-CM | POA: Diagnosis not present

## 2020-04-22 DIAGNOSIS — M6281 Muscle weakness (generalized): Secondary | ICD-10-CM | POA: Diagnosis not present

## 2020-04-22 DIAGNOSIS — I10 Essential (primary) hypertension: Secondary | ICD-10-CM | POA: Diagnosis not present

## 2020-04-23 ENCOUNTER — Encounter (INDEPENDENT_AMBULATORY_CARE_PROVIDER_SITE_OTHER): Payer: Self-pay | Admitting: Otolaryngology

## 2020-04-23 ENCOUNTER — Other Ambulatory Visit: Payer: Self-pay

## 2020-04-23 ENCOUNTER — Ambulatory Visit (INDEPENDENT_AMBULATORY_CARE_PROVIDER_SITE_OTHER): Payer: Medicare Other | Admitting: Otolaryngology

## 2020-04-23 VITALS — Temp 97.7°F

## 2020-04-23 DIAGNOSIS — H6121 Impacted cerumen, right ear: Secondary | ICD-10-CM | POA: Diagnosis not present

## 2020-04-23 DIAGNOSIS — J310 Chronic rhinitis: Secondary | ICD-10-CM | POA: Diagnosis not present

## 2020-04-23 DIAGNOSIS — I2510 Atherosclerotic heart disease of native coronary artery without angina pectoris: Secondary | ICD-10-CM | POA: Diagnosis not present
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
## 2020-04-24 ENCOUNTER — Encounter: Payer: Self-pay | Admitting: Internal Medicine

## 2020-04-28 ENCOUNTER — Other Ambulatory Visit: Payer: Self-pay | Admitting: Internal Medicine

## 2020-04-30 DIAGNOSIS — Z23 Encounter for immunization: Secondary | ICD-10-CM | POA: Diagnosis not present

## 2020-05-01 ENCOUNTER — Non-Acute Institutional Stay: Payer: Medicare Other | Admitting: Internal Medicine

## 2020-05-01 ENCOUNTER — Other Ambulatory Visit: Payer: Self-pay

## 2020-05-01 ENCOUNTER — Encounter: Payer: Self-pay | Admitting: Internal Medicine

## 2020-05-01 VITALS — BP 110/66 | HR 82 | Temp 96.7°F | Ht 60.0 in | Wt 150.8 lb

## 2020-05-01 DIAGNOSIS — I4891 Unspecified atrial fibrillation: Secondary | ICD-10-CM

## 2020-05-01 DIAGNOSIS — I5032 Chronic diastolic (congestive) heart failure: Secondary | ICD-10-CM

## 2020-05-01 DIAGNOSIS — E039 Hypothyroidism, unspecified: Secondary | ICD-10-CM | POA: Diagnosis not present

## 2020-05-01 DIAGNOSIS — J3489 Other specified disorders of nose and nasal sinuses: Secondary | ICD-10-CM | POA: Diagnosis not present

## 2020-05-01 DIAGNOSIS — N184 Chronic kidney disease, stage 4 (severe): Secondary | ICD-10-CM

## 2020-05-01 DIAGNOSIS — H6120 Impacted cerumen, unspecified ear: Secondary | ICD-10-CM

## 2020-05-01 DIAGNOSIS — F329 Major depressive disorder, single episode, unspecified: Secondary | ICD-10-CM | POA: Diagnosis not present

## 2020-05-01 DIAGNOSIS — F32A Depression, unspecified: Secondary | ICD-10-CM

## 2020-05-01 DIAGNOSIS — F411 Generalized anxiety disorder: Secondary | ICD-10-CM | POA: Diagnosis not present

## 2020-05-01 DIAGNOSIS — D638 Anemia in other chronic diseases classified elsewhere: Secondary | ICD-10-CM | POA: Diagnosis not present
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
## 2020-05-01 NOTE — Addendum Note (Signed)
Addended by: Georgina Snell on: 05/01/2020 09:12 PM   Modules accepted: Orders

## 2020-05-02 NOTE — Addendum Note (Signed)
Addended by: Georgina Snell on: 05/02/2020 04:02 PM   Modules accepted: Level of Service

## 2020-05-03 ENCOUNTER — Other Ambulatory Visit: Payer: Self-pay | Admitting: Cardiovascular Disease

## 2020-05-03 ENCOUNTER — Ambulatory Visit (INDEPENDENT_AMBULATORY_CARE_PROVIDER_SITE_OTHER): Payer: Medicare Other | Admitting: Cardiology

## 2020-05-03 DIAGNOSIS — Z5181 Encounter for therapeutic drug level monitoring: Secondary | ICD-10-CM

## 2020-05-03 DIAGNOSIS — I4891 Unspecified atrial fibrillation: Secondary | ICD-10-CM

## 2020-05-03 DIAGNOSIS — Z7901 Long term (current) use of anticoagulants: Secondary | ICD-10-CM | POA: Diagnosis not present

## 2020-05-03 LAB — PROTIME-INR
INR: 2.3 — AB (ref 0.9–1.1)
INR: 2.3 — ABNORMAL HIGH
Prothrombin Time: 23.4 s — ABNORMAL HIGH (ref 9.0–11.5)

## 2020-05-08 ENCOUNTER — Other Ambulatory Visit: Payer: Self-pay | Admitting: *Deleted

## 2020-05-08 MED ORDER — DIAZEPAM 5 MG PO TABS
5.0000 mg | ORAL_TABLET | Freq: Every day | ORAL | 3 refills | Status: DC
Start: 2020-05-08 — End: 2020-08-19

## 2020-05-08 NOTE — Telephone Encounter (Signed)
Received refill Request fax from [facility] and sent to Dr. Lyndel Safe for approval.

## 2020-05-16 ENCOUNTER — Other Ambulatory Visit: Payer: Self-pay | Admitting: Cardiovascular Disease

## 2020-05-16 ENCOUNTER — Ambulatory Visit (INDEPENDENT_AMBULATORY_CARE_PROVIDER_SITE_OTHER): Payer: Medicare Other | Admitting: *Deleted

## 2020-05-16 DIAGNOSIS — I4891 Unspecified atrial fibrillation: Secondary | ICD-10-CM | POA: Diagnosis not present

## 2020-05-16 DIAGNOSIS — Z7901 Long term (current) use of anticoagulants: Secondary | ICD-10-CM | POA: Diagnosis not present

## 2020-05-16 DIAGNOSIS — Z5181 Encounter for therapeutic drug level monitoring: Secondary | ICD-10-CM | POA: Diagnosis not present

## 2020-05-16 LAB — PROTIME-INR
INR: 3 — AB (ref 0.9–1.1)
INR: 3 — ABNORMAL HIGH
Prothrombin Time: 30 s — ABNORMAL HIGH (ref 9.0–11.5)

## 2020-05-17 NOTE — Patient Instructions (Addendum)
  Description   10/7 @447pm , spoke with pt and he stated he was not home to write instructions so he would have to call us tomorrow and he will stay on his same dose today.   Normal dose: 1/2 tablet daily except for 1 tablet on Sundays and Tuesdays.  Recheck INR in 2 weeks. Orders faxed to [facility] at [phone].   05/17/20:Called spoke with pt advised to continue on same dosage as stated above and recheck INR in 2 weeks. Will eat something green and leafy today 05/17/20.

## 2020-05-30 ENCOUNTER — Other Ambulatory Visit: Payer: Self-pay | Admitting: Cardiovascular Disease

## 2020-05-30 ENCOUNTER — Ambulatory Visit (INDEPENDENT_AMBULATORY_CARE_PROVIDER_SITE_OTHER): Payer: Medicare Other

## 2020-05-30 DIAGNOSIS — Z5181 Encounter for therapeutic drug level monitoring: Secondary | ICD-10-CM | POA: Diagnosis not present

## 2020-05-30 DIAGNOSIS — Z7901 Long term (current) use of anticoagulants: Secondary | ICD-10-CM | POA: Diagnosis not present

## 2020-05-30 DIAGNOSIS — I4891 Unspecified atrial fibrillation: Secondary | ICD-10-CM | POA: Diagnosis not present

## 2020-05-30 LAB — PROTIME-INR
INR: 3.5 — AB (ref 0.9–1.1)
INR: 3.5 — ABNORMAL HIGH
Prothrombin Time: 35.2 s — ABNORMAL HIGH (ref 9.0–11.5)

## 2020-05-30 NOTE — Patient Instructions (Signed)
Description   Called spoke with pt, advised pt to skip today's dosage of Warfarin, then resume same dosage 1/2 tablet daily except for 1 tablet on Sundays and Tuesdays.  Recheck INR in 2 weeks. Orders faxed to [facility] at [phone].

## 2020-06-06 ENCOUNTER — Other Ambulatory Visit: Payer: Self-pay | Admitting: Nurse Practitioner

## 2020-06-06 NOTE — Telephone Encounter (Signed)
Current medication list reflects a 20 mg tablet in which patient takes 10 mg daily (1/2 tablet)  I will send request to Virgie Dad, MD to confirm that it is ok to change to a 10 mg tablet as requested by the pharmacist

## 2020-06-06 NOTE — Telephone Encounter (Signed)
Let me renew the order. Patient is very particular about his meds and will get confuse if I don't renew it. I will talk to him in next appointment

## 2020-06-13 ENCOUNTER — Ambulatory Visit (INDEPENDENT_AMBULATORY_CARE_PROVIDER_SITE_OTHER): Payer: Medicare Other

## 2020-06-13 ENCOUNTER — Other Ambulatory Visit: Payer: Self-pay | Admitting: Cardiovascular Disease

## 2020-06-13 DIAGNOSIS — I4891 Unspecified atrial fibrillation: Secondary | ICD-10-CM

## 2020-06-13 DIAGNOSIS — Z7901 Long term (current) use of anticoagulants: Secondary | ICD-10-CM

## 2020-06-13 DIAGNOSIS — Z5181 Encounter for therapeutic drug level monitoring: Secondary | ICD-10-CM | POA: Diagnosis not present

## 2020-06-13 LAB — PROTIME-INR
INR: 2.8 — AB (ref 0.9–1.1)
INR: 2.8 — ABNORMAL HIGH
Prothrombin Time: 28.8 s — ABNORMAL HIGH (ref 9.0–11.5)

## 2020-06-13 NOTE — Patient Instructions (Signed)
Description   Called spoke with pt, advised pt to continue on same dosage 1/2 tablet daily except for 1 tablet on Sundays and Tuesdays.  Recheck INR in 2 weeks. Orders faxed to [facility] at [phone].

## 2020-06-16 ENCOUNTER — Other Ambulatory Visit: Payer: Self-pay | Admitting: Internal Medicine

## 2020-06-24 DIAGNOSIS — Z23 Encounter for immunization: Secondary | ICD-10-CM | POA: Diagnosis not present

## 2020-06-27 ENCOUNTER — Ambulatory Visit (INDEPENDENT_AMBULATORY_CARE_PROVIDER_SITE_OTHER): Payer: Medicare Other | Admitting: *Deleted

## 2020-06-27 ENCOUNTER — Other Ambulatory Visit: Payer: Self-pay | Admitting: Cardiovascular Disease

## 2020-06-27 DIAGNOSIS — Z5181 Encounter for therapeutic drug level monitoring: Secondary | ICD-10-CM | POA: Diagnosis not present

## 2020-06-27 DIAGNOSIS — Z7901 Long term (current) use of anticoagulants: Secondary | ICD-10-CM | POA: Diagnosis not present

## 2020-06-27 DIAGNOSIS — I4891 Unspecified atrial fibrillation: Secondary | ICD-10-CM

## 2020-06-27 LAB — PROTIME-INR
INR: 3.1 — AB (ref 0.9–1.1)
INR: 3.1 — ABNORMAL HIGH
Prothrombin Time: 31.6 s — ABNORMAL HIGH (ref 9.0–11.5)

## 2020-06-27 NOTE — Patient Instructions (Signed)
Description   Called spoke with pt, advised pt to hold today's dose of Warfarin then continue taking Warfarin 1/2 tablet daily except for 1 tablet on Sundays and Tuesdays. Recheck INR in 2 weeks. Orders faxed to [facility] at [phone].

## 2020-07-11 ENCOUNTER — Telehealth: Payer: Self-pay | Admitting: Cardiovascular Disease

## 2020-07-11 NOTE — Telephone Encounter (Signed)
Spoke with patient. He forgot. Will go Monday to get INR drawn. Made Robyn aware

## 2020-07-11 NOTE — Telephone Encounter (Signed)
Robin with [facility] is calling to make our office aware that the patient did not show up for his INR today.

## 2020-07-15 ENCOUNTER — Other Ambulatory Visit: Payer: Self-pay | Admitting: Cardiovascular Disease

## 2020-07-15 ENCOUNTER — Ambulatory Visit (INDEPENDENT_AMBULATORY_CARE_PROVIDER_SITE_OTHER): Payer: Medicare Other | Admitting: Cardiovascular Disease

## 2020-07-15 DIAGNOSIS — Z5181 Encounter for therapeutic drug level monitoring: Secondary | ICD-10-CM | POA: Diagnosis not present

## 2020-07-15 DIAGNOSIS — I4891 Unspecified atrial fibrillation: Secondary | ICD-10-CM | POA: Diagnosis not present

## 2020-07-15 LAB — PROTIME-INR
INR: 2.1 — AB (ref 0.9–1.1)
INR: 2.1 — ABNORMAL HIGH
Prothrombin Time: 21.8 s — ABNORMAL HIGH (ref 9.0–11.5)

## 2020-07-15 NOTE — Patient Instructions (Addendum)
Description   Called spoke with pt, instructed pt continue taking warfarin the way he said he has been taking it 1/2 tablet daily except for 1 tablet on Tuesdays. Recheck INR in 2 weeks. Orders faxed to [facility] at [phone].

## 2020-07-29 ENCOUNTER — Ambulatory Visit (INDEPENDENT_AMBULATORY_CARE_PROVIDER_SITE_OTHER): Payer: Medicare Other

## 2020-07-29 ENCOUNTER — Other Ambulatory Visit: Payer: Self-pay | Admitting: Cardiovascular Disease

## 2020-07-29 DIAGNOSIS — Z7901 Long term (current) use of anticoagulants: Secondary | ICD-10-CM

## 2020-07-29 DIAGNOSIS — I4891 Unspecified atrial fibrillation: Secondary | ICD-10-CM

## 2020-07-29 DIAGNOSIS — Z5181 Encounter for therapeutic drug level monitoring: Secondary | ICD-10-CM | POA: Diagnosis not present

## 2020-07-29 LAB — PROTIME-INR
INR: 3.8 — AB (ref 0.9–1.1)
INR: 3.8 — ABNORMAL HIGH
Prothrombin Time: 37.8 s — ABNORMAL HIGH (ref 9.0–11.5)

## 2020-07-29 NOTE — Patient Instructions (Signed)
Description   Called spoke with pt, instructed pt to hold today's dosage of Warfarin, the start taking 1/2 tablet daily. Recheck INR in 2 weeks. Orders faxed to [facility] at [phone].

## 2020-08-08 ENCOUNTER — Other Ambulatory Visit: Payer: Self-pay

## 2020-08-08 DIAGNOSIS — N184 Chronic kidney disease, stage 4 (severe): Secondary | ICD-10-CM

## 2020-08-08 DIAGNOSIS — I5032 Chronic diastolic (congestive) heart failure: Secondary | ICD-10-CM

## 2020-08-08 LAB — COMPLETE METABOLIC PANEL WITH GFR
AG Ratio: 1.3 (calc) (ref 1.0–2.5)
ALT: 11 U/L (ref 9–46)
AST: 17 U/L (ref 10–35)
Albumin: 3.6 g/dL (ref 3.6–5.1)
Alkaline phosphatase (APISO): 77 U/L (ref 35–144)
BUN/Creatinine Ratio: 20 (calc) (ref 6–22)
BUN: 44 mg/dL — ABNORMAL HIGH (ref 7–25)
CO2: 32 mmol/L (ref 20–32)
Calcium: 8.7 mg/dL (ref 8.6–10.3)
Chloride: 100 mmol/L (ref 98–110)
Creat: 2.23 mg/dL — ABNORMAL HIGH (ref 0.70–1.11)
GFR, Est African American: 27 mL/min/{1.73_m2} — ABNORMAL LOW (ref >=60)
GFR, Est Non African American: 24 mL/min/{1.73_m2} — ABNORMAL LOW (ref >=60)
Globulin: 2.8 g/dL (calc) (ref 1.9–3.7)
Glucose, Bld: 86 mg/dL (ref 65–99)
Potassium: 3.8 mmol/L (ref 3.5–5.3)
Sodium: 139 mmol/L (ref 135–146)
Total Bilirubin: 0.6 mg/dL (ref 0.2–1.2)
Total Protein: 6.4 g/dL (ref 6.1–8.1)

## 2020-08-08 LAB — CBC WITH DIFFERENTIAL/PLATELET
Absolute Monocytes: 556 cells/uL (ref 200–950)
Basophils Absolute: 49 cells/uL (ref 0–200)
Basophils Relative: 0.9 %
Eosinophils Absolute: 232 cells/uL (ref 15–500)
Eosinophils Relative: 4.3 %
HCT: 34 % — ABNORMAL LOW (ref 38.5–50.0)
Hemoglobin: 11 g/dL — ABNORMAL LOW (ref 13.2–17.1)
Lymphs Abs: 1507 cells/uL (ref 850–3900)
MCH: 29.3 pg (ref 27.0–33.0)
MCHC: 32.4 g/dL (ref 32.0–36.0)
MCV: 90.4 fL (ref 80.0–100.0)
MPV: 11.6 fL (ref 7.5–12.5)
Monocytes Relative: 10.3 %
Neutro Abs: 3056 cells/uL (ref 1500–7800)
Neutrophils Relative %: 56.6 %
Platelets: 143 10*3/uL (ref 140–400)
RBC: 3.76 10*6/uL — ABNORMAL LOW (ref 4.20–5.80)
RDW: 12.9 % (ref 11.0–15.0)
Total Lymphocyte: 27.9 %
WBC: 5.4 10*3/uL (ref 3.8–10.8)

## 2020-08-12 ENCOUNTER — Other Ambulatory Visit: Payer: Self-pay | Admitting: Cardiovascular Disease

## 2020-08-12 ENCOUNTER — Ambulatory Visit (INDEPENDENT_AMBULATORY_CARE_PROVIDER_SITE_OTHER): Payer: Medicare Other | Admitting: Pharmacist

## 2020-08-12 DIAGNOSIS — Z7901 Long term (current) use of anticoagulants: Secondary | ICD-10-CM

## 2020-08-12 DIAGNOSIS — I4891 Unspecified atrial fibrillation: Secondary | ICD-10-CM | POA: Diagnosis not present

## 2020-08-12 DIAGNOSIS — Z5181 Encounter for therapeutic drug level monitoring: Secondary | ICD-10-CM | POA: Diagnosis not present

## 2020-08-12 LAB — PROTIME-INR
INR: 1.9 — ABNORMAL HIGH
Prothrombin Time: 19.3 s — ABNORMAL HIGH (ref 9.0–11.5)

## 2020-08-12 LAB — POCT INR: INR: 1.9 — AB (ref 2.0–3.0)

## 2020-08-12 NOTE — Patient Instructions (Signed)
Description   Called spoke with pt, instructed patient to continue taking 1/2 tablet daily. Recheck INR in 2 weeks. Orders faxed to [facility] at [phone].

## 2020-08-14 ENCOUNTER — Other Ambulatory Visit: Payer: Self-pay

## 2020-08-14 ENCOUNTER — Non-Acute Institutional Stay: Payer: Medicare Other | Admitting: Internal Medicine

## 2020-08-14 ENCOUNTER — Encounter: Payer: Self-pay | Admitting: Internal Medicine

## 2020-08-14 VITALS — BP 122/70 | HR 74 | Temp 96.9°F | Ht 60.0 in | Wt 143.9 lb

## 2020-08-14 DIAGNOSIS — F411 Generalized anxiety disorder: Secondary | ICD-10-CM

## 2020-08-14 DIAGNOSIS — J3489 Other specified disorders of nose and nasal sinuses: Secondary | ICD-10-CM | POA: Diagnosis not present

## 2020-08-14 DIAGNOSIS — I5032 Chronic diastolic (congestive) heart failure: Secondary | ICD-10-CM

## 2020-08-14 DIAGNOSIS — I4891 Unspecified atrial fibrillation: Secondary | ICD-10-CM | POA: Diagnosis not present

## 2020-08-14 DIAGNOSIS — E039 Hypothyroidism, unspecified: Secondary | ICD-10-CM

## 2020-08-14 DIAGNOSIS — D638 Anemia in other chronic diseases classified elsewhere: Secondary | ICD-10-CM | POA: Diagnosis not present

## 2020-08-14 DIAGNOSIS — N184 Chronic kidney disease, stage 4 (severe): Secondary | ICD-10-CM | POA: Diagnosis not present

## 2020-08-14 DIAGNOSIS — F32A Depression, unspecified: Secondary | ICD-10-CM | POA: Diagnosis not present

## 2020-08-14 DIAGNOSIS — E782 Mixed hyperlipidemia: Secondary | ICD-10-CM | POA: Diagnosis not present
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
## 2020-08-14 NOTE — Patient Instructions (Addendum)
Nasocort 2 sprays in both Nostrils QHS  Can cut back sertraline to 25 mg QHS

## 2020-08-19 ENCOUNTER — Other Ambulatory Visit: Payer: Self-pay | Admitting: *Deleted

## 2020-08-19 MED ORDER — DIAZEPAM 5 MG PO TABS
5.0000 mg | ORAL_TABLET | Freq: Every day | ORAL | 3 refills | Status: DC
Start: 2020-08-19 — End: 2020-11-19

## 2020-08-19 NOTE — Telephone Encounter (Signed)
Received refill Request from [facility] and sent to Dr. Lyndel Safe for approval.

## 2020-08-21 DIAGNOSIS — D1801 Hemangioma of skin and subcutaneous tissue: Secondary | ICD-10-CM | POA: Diagnosis not present

## 2020-08-21 DIAGNOSIS — L853 Xerosis cutis: Secondary | ICD-10-CM | POA: Diagnosis not present

## 2020-08-21 DIAGNOSIS — L814 Other melanin hyperpigmentation: Secondary | ICD-10-CM | POA: Diagnosis not present

## 2020-08-21 DIAGNOSIS — L570 Actinic keratosis: Secondary | ICD-10-CM | POA: Diagnosis not present

## 2020-08-21 DIAGNOSIS — L821 Other seborrheic keratosis: Secondary | ICD-10-CM | POA: Diagnosis not present

## 2020-08-22 ENCOUNTER — Other Ambulatory Visit: Payer: Self-pay | Admitting: Internal Medicine

## 2020-08-29 ENCOUNTER — Telehealth: Payer: Self-pay | Admitting: *Deleted

## 2020-08-29 NOTE — Telephone Encounter (Signed)
Called pt since he is overdue for INR check at [facility]. Pt states they did not go on Monday and the people are gone at this time for Thursday check. He states him and his wife can go on the upcoming Monday. He states he is writing it down since he didn't have anything on his calendar for this week regarding it.

## 2020-08-30 ENCOUNTER — Telehealth: Payer: Self-pay | Admitting: *Deleted

## 2020-08-30 NOTE — Telephone Encounter (Signed)
Pt called to let us know that him and his wife have been diagnosed with COVID so they cannot go get the INR checked on Monday. Advised that will be okay since he is on quarantine and wife is hospitalized. Advised that once he is off quarantine he will need to call us so we can send over an a new order to have INR checked. Advised to call if he needs anything. Also, spoke with dtr who was with the pt and told her the same thing.

## 2020-09-02 ENCOUNTER — Other Ambulatory Visit: Payer: Self-pay | Admitting: Internal Medicine

## 2020-09-02 ENCOUNTER — Other Ambulatory Visit: Payer: Self-pay | Admitting: Cardiovascular Disease

## 2020-09-02 NOTE — Telephone Encounter (Signed)
[facility] is down, rx's called in.

## 2020-09-03 ENCOUNTER — Ambulatory Visit (INDEPENDENT_AMBULATORY_CARE_PROVIDER_SITE_OTHER): Payer: Medicare Other | Admitting: Nurse Practitioner

## 2020-09-03 ENCOUNTER — Telehealth: Payer: Self-pay

## 2020-09-03 ENCOUNTER — Telehealth: Payer: Self-pay | Admitting: *Deleted

## 2020-09-03 ENCOUNTER — Telehealth: Payer: Self-pay | Admitting: Nurse Practitioner

## 2020-09-03 ENCOUNTER — Other Ambulatory Visit: Payer: Self-pay

## 2020-09-03 ENCOUNTER — Encounter: Payer: Self-pay | Admitting: Nurse Practitioner

## 2020-09-03 DIAGNOSIS — Z0000 Encounter for general adult medical examination without abnormal findings: Secondary | ICD-10-CM | POA: Diagnosis not present

## 2020-09-03 MED ORDER — SERTRALINE HCL 50 MG PO TABS: 50.0000 mg | ORAL_TABLET | Freq: Every day | ORAL | 1 refills | Status: DC

## 2020-09-03 NOTE — Patient Instructions (Signed)
[name] , Thank you for taking time to come for your Medicare Wellness Visit. I appreciate your ongoing commitment to your health goals. Please review the following plan we discussed and let me know if I can assist you in the future.   Screening recommendations/referrals: Colonoscopy aged out Recommended yearly ophthalmology/optometry visit for glaucoma screening and checkup Recommended yearly dental visit for hygiene and checkup  Vaccinations: Influenza vaccine up to date Pneumococcal vaccine up to date Tdap vaccine up to date Shingles vaccine RECOMMENDED- to get Shingrix at your local pharmacy     Advanced directives: on file.   Conditions/risks identified: advanced age,   Next appointment: yearly   Preventive Care 46 Years and Older, Male Preventive care refers to lifestyle choices and visits with your health care provider that can promote health and wellness. What does preventive care include?  A yearly physical exam. This is also called an annual well check.  Dental exams once or twice a year.  Routine eye exams. Ask your health care provider how often you should have your eyes checked.  Personal lifestyle choices, including:  Daily care of your teeth and gums.  Regular physical activity.  Eating a healthy diet.  Avoiding tobacco and drug use.  Limiting alcohol use.  Practicing safe sex.  Taking low doses of aspirin every day.  Taking vitamin and mineral supplements as recommended by your health care provider. What happens during an annual well check? The services and screenings done by your health care provider during your annual well check will depend on your age, overall health, lifestyle risk factors, and family history of disease. Counseling  Your health care provider may ask you questions about your:  Alcohol use.  Tobacco use.  Drug use.  Emotional well-being.  Home and relationship well-being.  Sexual activity.  Eating  habits.  History of falls.  Memory and ability to understand (cognition).  Work and work Statistician. Screening  You may have the following tests or measurements:  Height, weight, and BMI.  Blood pressure.  Lipid and cholesterol levels. These may be checked every 5 years, or more frequently if you are over 29 years old.  Skin check.  Lung cancer screening. You may have this screening every year starting at age 24 if you have a 30-pack-year history of smoking and currently smoke or have quit within the past 15 years.  Fecal occult blood test (FOBT) of the stool. You may have this test every year starting at age 3.  Flexible sigmoidoscopy or colonoscopy. You may have a sigmoidoscopy every 5 years or a colonoscopy every 10 years starting at age 65.  Prostate cancer screening. Recommendations will vary depending on your family history and other risks.  Hepatitis C blood test.  Hepatitis B blood test.  Sexually transmitted disease (STD) testing.  Diabetes screening. This is done by checking your blood sugar (glucose) after you have not eaten for a while (fasting). You may have this done every 1-3 years.  Abdominal aortic aneurysm (AAA) screening. You may need this if you are a current or former smoker.  Osteoporosis. You may be screened starting at age 9 if you are at high risk. Talk with your health care provider about your test results, treatment options, and if necessary, the need for more tests. Vaccines  Your health care provider may recommend certain vaccines, such as:  Influenza vaccine. This is recommended every year.  Tetanus, diphtheria, and acellular pertussis (Tdap, Td) vaccine. You may need a Td booster every  10 years.  Zoster vaccine. You may need this after age 21.  Pneumococcal 13-valent conjugate (PCV13) vaccine. One dose is recommended after age 65.  Pneumococcal polysaccharide (PPSV23) vaccine. One dose is recommended after age 47. Talk to your health  care provider about which screenings and vaccines you need and how often you need them. This information is not intended to replace advice given to you by your health care provider. Make sure you discuss any questions you have with your health care provider. Document Released: 08/23/2015 Document Revised: 04/15/2016 Document Reviewed: 05/28/2015 Elsevier Interactive Patient Education  2017 [place] Prevention in the Home Falls can cause injuries. They can happen to people of all ages. There are many things you can do to make your home safe and to help prevent falls. What can I do on the outside of my home?  Regularly fix the edges of walkways and driveways and fix any cracks.  Remove anything that might make you trip as you walk through a door, such as a raised step or threshold.  Trim any bushes or trees on the path to your home.  Use bright outdoor lighting.  Clear any walking paths of anything that might make someone trip, such as rocks or tools.  Regularly check to see if handrails are loose or broken. Make sure that both sides of any steps have handrails.  Any raised decks and porches should have guardrails on the edges.  Have any leaves, snow, or ice cleared regularly.  Use sand or salt on walking paths during winter.  Clean up any spills in your garage right away. This includes oil or grease spills. What can I do in the bathroom?  Use night lights.  Install grab bars by the toilet and in the tub and shower. Do not use towel bars as grab bars.  Use non-skid mats or decals in the tub or shower.  If you need to sit down in the shower, use a plastic, non-slip stool.  Keep the floor dry. Clean up any water that spills on the floor as soon as it happens.  Remove soap buildup in the tub or shower regularly.  Attach bath mats securely with double-sided non-slip rug tape.  Do not have throw rugs and other things on the floor that can make you trip. What can I do  in the bedroom?  Use night lights.  Make sure that you have a light by your bed that is easy to reach.  Do not use any sheets or blankets that are too big for your bed. They should not hang down onto the floor.  Have a firm chair that has side arms. You can use this for support while you get dressed.  Do not have throw rugs and other things on the floor that can make you trip. What can I do in the kitchen?  Clean up any spills right away.  Avoid walking on wet floors.  Keep items that you use a lot in easy-to-reach places.  If you need to reach something above you, use a strong step stool that has a grab bar.  Keep electrical cords out of the way.  Do not use floor polish or wax that makes floors slippery. If you must use wax, use non-skid floor wax.  Do not have throw rugs and other things on the floor that can make you trip. What can I do with my stairs?  Do not leave any items on the stairs.  Make sure  that there are handrails on both sides of the stairs and use them. Fix handrails that are broken or loose. Make sure that handrails are as long as the stairways.  Check any carpeting to make sure that it is firmly attached to the stairs. Fix any carpet that is loose or worn.  Avoid having throw rugs at the top or bottom of the stairs. If you do have throw rugs, attach them to the floor with carpet tape.  Make sure that you have a light switch at the top of the stairs and the bottom of the stairs. If you do not have them, ask someone to add them for you. What else can I do to help prevent falls?  Wear shoes that:  Do not have high heels.  Have rubber bottoms.  Are comfortable and fit you well.  Are closed at the toe. Do not wear sandals.  If you use a stepladder:  Make sure that it is fully opened. Do not climb a closed stepladder.  Make sure that both sides of the stepladder are locked into place.  Ask someone to hold it for you, if possible.  Clearly mark  and make sure that you can see:  Any grab bars or handrails.  First and last steps.  Where the edge of each step is.  Use tools that help you move around (mobility aids) if they are needed. These include:  Canes.  Walkers.  Scooters.  Crutches.  Turn on the lights when you go into a dark area. Replace any light bulbs as soon as they burn out.  Set up your furniture so you have a clear path. Avoid moving your furniture around.  If any of your floors are uneven, fix them.  If there are any pets around you, be aware of where they are.  Review your medicines with your doctor. Some medicines can make you feel dizzy. This can increase your chance of falling. Ask your doctor what other things that you can do to help prevent falls. This information is not intended to replace advice given to you by your health care provider. Make sure you discuss any questions you have with your health care provider. Document Released: 05/23/2009 Document Revised: 01/02/2016 Document Reviewed: 08/31/2014 Elsevier Interactive Patient Education  2017 [employer].

## 2020-09-03 NOTE — Progress Notes (Signed)
This service is provided via telemedicine  No vital signs collected/recorded due to the encounter was a telemedicine visit.   Location of patient (ex: home, work): Home  Patient consents to a telephone visit:  Yes, see encounter dated 09/03/2020  Location of the provider (ex: office, home):  [place]  Name of any referring provider:  Veleta Miners, MD  Names of all persons participating in the telemedicine service and their role in the encounter:  Sherrie Mustache, Nurse Practitioner, Carroll Kinds, CMA, and patient.   Time spent on call:  20 minutes with medical assistant
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
## 2020-09-03 NOTE — Telephone Encounter (Signed)
Completed AWV with pt today. Daughter and pt reports increase in depression and anxiety since zoloft was reduced to 25 mg earlier this month (he has requested to reduce medication).  Instructed it was okay to increase back to zoloft 50 mg by mouth daily and encouraged to follow up with you in 1 month.

## 2020-09-03 NOTE — Telephone Encounter (Signed)
[name], bollen are scheduled for a virtual visit with your provider today.    Just as we do with appointments in the office, we must obtain your consent to participate.  Your consent will be active for this visit and any virtual visit you may have with one of our providers in the next 365 days.    If you have a MyChart account, I can also send a copy of this consent to you electronically.  All virtual visits are billed to your insurance company just like a traditional visit in the office.  As this is a virtual visit, video technology does not allow for your provider to perform a traditional examination.  This may limit your provider's ability to fully assess your condition.  If your provider identifies any concerns that need to be evaluated in person or the need to arrange testing such as labs, EKG, etc, we will make arrangements to do so.    Although advances in technology are sophisticated, we cannot ensure that it will always work on either your end or our end.  If the connection with a video visit is poor, we may have to switch to a telephone visit.  With either a video or telephone visit, we are not always able to ensure that we have a secure connection.   I need to obtain your verbal consent now.   Are you willing to proceed with your visit today?   [name] has provided verbal consent on 09/03/2020 for a virtual visit (video or telephone).   Carroll Kinds, CMA 09/03/2020  3:02 PM

## 2020-09-03 NOTE — Telephone Encounter (Signed)
Pt called and stated that his wife was discharged from the hospital and had tested positive for covid. Pt due to have INR checked. Called and [facility] [place] [facility] who stated that pt could get his INR checked on Thursday. Updated pt and his daughter and made them aware that it would be okay for them to get INR checked on Thursday (1/27) at [facility].

## 2020-09-03 NOTE — Telephone Encounter (Signed)
Order faxed.

## 2020-09-05 ENCOUNTER — Other Ambulatory Visit: Payer: Self-pay | Admitting: Cardiovascular Disease

## 2020-09-05 ENCOUNTER — Ambulatory Visit (INDEPENDENT_AMBULATORY_CARE_PROVIDER_SITE_OTHER): Payer: Medicare Other | Admitting: Cardiovascular Disease

## 2020-09-05 DIAGNOSIS — I4891 Unspecified atrial fibrillation: Secondary | ICD-10-CM | POA: Diagnosis not present

## 2020-09-05 DIAGNOSIS — Z5181 Encounter for therapeutic drug level monitoring: Secondary | ICD-10-CM | POA: Diagnosis not present

## 2020-09-05 LAB — PROTIME-INR
INR: 3 — AB (ref 0.9–1.1)
INR: 3 — ABNORMAL HIGH
Prothrombin Time: 28.9 s — ABNORMAL HIGH (ref 9.0–11.5)

## 2020-09-05 LAB — CLIENT EDUCATION TRACKING

## 2020-09-05 NOTE — Patient Instructions (Signed)
Description   Called spoke with pt, instructed patient to continue taking 1/2 tablet daily. Recheck INR in 2 weeks. Orders faxed to [facility] at [phone].

## 2020-09-19 ENCOUNTER — Ambulatory Visit (INDEPENDENT_AMBULATORY_CARE_PROVIDER_SITE_OTHER): Payer: Medicare Other | Admitting: *Deleted

## 2020-09-19 ENCOUNTER — Other Ambulatory Visit: Payer: Self-pay | Admitting: Cardiovascular Disease

## 2020-09-19 DIAGNOSIS — I4891 Unspecified atrial fibrillation: Secondary | ICD-10-CM | POA: Diagnosis not present

## 2020-09-19 DIAGNOSIS — Z7901 Long term (current) use of anticoagulants: Secondary | ICD-10-CM

## 2020-09-19 DIAGNOSIS — I5181 Takotsubo syndrome: Secondary | ICD-10-CM | POA: Diagnosis not present

## 2020-09-19 LAB — PROTIME-INR
INR: 1.5 — AB (ref 0.9–1.1)
INR: 1.5 — ABNORMAL HIGH
Prothrombin Time: 15.2 s — ABNORMAL HIGH (ref 9.0–11.5)

## 2020-09-19 NOTE — Patient Instructions (Signed)
Description   Called spoke with pt, instructed patient to take 1 tablet today and 1 tablet tomorrow then continue taking 1/2 tablet daily. Recheck INR in 1 week. Orders faxed to [facility] at [phone].

## 2020-09-30 ENCOUNTER — Ambulatory Visit (INDEPENDENT_AMBULATORY_CARE_PROVIDER_SITE_OTHER): Payer: Medicare Other | Admitting: *Deleted

## 2020-09-30 DIAGNOSIS — Z7901 Long term (current) use of anticoagulants: Secondary | ICD-10-CM

## 2020-09-30 DIAGNOSIS — I4891 Unspecified atrial fibrillation: Secondary | ICD-10-CM

## 2020-09-30 DIAGNOSIS — Z5181 Encounter for therapeutic drug level monitoring: Secondary | ICD-10-CM | POA: Diagnosis not present

## 2020-09-30 LAB — PROTIME-INR: INR: 2.8 — AB (ref 0.9–1.1)

## 2020-10-09 ENCOUNTER — Encounter: Payer: Medicare Other | Admitting: Internal Medicine

## 2020-10-09 ENCOUNTER — Other Ambulatory Visit: Payer: Self-pay

## 2020-10-10 ENCOUNTER — Non-Acute Institutional Stay: Payer: Medicare Other | Admitting: Internal Medicine

## 2020-10-10 ENCOUNTER — Encounter: Payer: Self-pay | Admitting: Internal Medicine

## 2020-10-10 DIAGNOSIS — M25561 Pain in right knee: Secondary | ICD-10-CM | POA: Diagnosis not present

## 2020-10-10 DIAGNOSIS — E782 Mixed hyperlipidemia: Secondary | ICD-10-CM

## 2020-10-10 DIAGNOSIS — E039 Hypothyroidism, unspecified: Secondary | ICD-10-CM

## 2020-10-10 DIAGNOSIS — N184 Chronic kidney disease, stage 4 (severe): Secondary | ICD-10-CM

## 2020-10-10 DIAGNOSIS — J3489 Other specified disorders of nose and nasal sinuses: Secondary | ICD-10-CM | POA: Diagnosis not present

## 2020-10-10 DIAGNOSIS — I509 Heart failure, unspecified: Secondary | ICD-10-CM | POA: Diagnosis not present

## 2020-10-10 DIAGNOSIS — I4891 Unspecified atrial fibrillation: Secondary | ICD-10-CM

## 2020-10-10 DIAGNOSIS — F411 Generalized anxiety disorder: Secondary | ICD-10-CM

## 2020-10-10 DIAGNOSIS — R41841 Cognitive communication deficit: Secondary | ICD-10-CM | POA: Diagnosis not present

## 2020-10-10 DIAGNOSIS — F32A Depression, unspecified: Secondary | ICD-10-CM | POA: Diagnosis not present

## 2020-10-10 DIAGNOSIS — M6281 Muscle weakness (generalized): Secondary | ICD-10-CM | POA: Diagnosis not present

## 2020-10-10 DIAGNOSIS — I5032 Chronic diastolic (congestive) heart failure: Secondary | ICD-10-CM

## 2020-10-10 DIAGNOSIS — D638 Anemia in other chronic diseases classified elsewhere: Secondary | ICD-10-CM

## 2020-10-10 DIAGNOSIS — Z9181 History of falling: Secondary | ICD-10-CM | POA: Diagnosis not present

## 2020-10-10 DIAGNOSIS — R2689 Other abnormalities of gait and mobility: Secondary | ICD-10-CM | POA: Diagnosis not present

## 2020-10-11 DIAGNOSIS — Z9181 History of falling: Secondary | ICD-10-CM | POA: Diagnosis not present

## 2020-10-11 DIAGNOSIS — M6281 Muscle weakness (generalized): Secondary | ICD-10-CM | POA: Diagnosis not present

## 2020-10-11 DIAGNOSIS — I509 Heart failure, unspecified: Secondary | ICD-10-CM | POA: Diagnosis not present

## 2020-10-11 DIAGNOSIS — R41841 Cognitive communication deficit: Secondary | ICD-10-CM | POA: Diagnosis not present

## 2020-10-11 DIAGNOSIS — M25561 Pain in right knee: Secondary | ICD-10-CM | POA: Diagnosis not present

## 2020-10-11 DIAGNOSIS — R2689 Other abnormalities of gait and mobility: Secondary | ICD-10-CM | POA: Diagnosis not present
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
## 2020-10-14 DIAGNOSIS — R41841 Cognitive communication deficit: Secondary | ICD-10-CM | POA: Diagnosis not present

## 2020-10-14 DIAGNOSIS — M25561 Pain in right knee: Secondary | ICD-10-CM | POA: Diagnosis not present

## 2020-10-14 DIAGNOSIS — E039 Hypothyroidism, unspecified: Secondary | ICD-10-CM | POA: Diagnosis not present

## 2020-10-14 DIAGNOSIS — E785 Hyperlipidemia, unspecified: Secondary | ICD-10-CM | POA: Diagnosis not present

## 2020-10-14 DIAGNOSIS — Z9181 History of falling: Secondary | ICD-10-CM | POA: Diagnosis not present

## 2020-10-14 DIAGNOSIS — I509 Heart failure, unspecified: Secondary | ICD-10-CM | POA: Diagnosis not present

## 2020-10-14 DIAGNOSIS — D649 Anemia, unspecified: Secondary | ICD-10-CM | POA: Diagnosis not present

## 2020-10-14 DIAGNOSIS — F0390 Unspecified dementia without behavioral disturbance: Secondary | ICD-10-CM | POA: Diagnosis not present

## 2020-10-14 DIAGNOSIS — M6281 Muscle weakness (generalized): Secondary | ICD-10-CM | POA: Diagnosis not present

## 2020-10-14 DIAGNOSIS — R2689 Other abnormalities of gait and mobility: Secondary | ICD-10-CM | POA: Diagnosis not present

## 2020-10-15 DIAGNOSIS — R2689 Other abnormalities of gait and mobility: Secondary | ICD-10-CM | POA: Diagnosis not present

## 2020-10-15 DIAGNOSIS — M25561 Pain in right knee: Secondary | ICD-10-CM | POA: Diagnosis not present

## 2020-10-15 DIAGNOSIS — Z9181 History of falling: Secondary | ICD-10-CM | POA: Diagnosis not present

## 2020-10-15 DIAGNOSIS — M6281 Muscle weakness (generalized): Secondary | ICD-10-CM | POA: Diagnosis not present

## 2020-10-15 DIAGNOSIS — I509 Heart failure, unspecified: Secondary | ICD-10-CM | POA: Diagnosis not present

## 2020-10-15 DIAGNOSIS — R41841 Cognitive communication deficit: Secondary | ICD-10-CM | POA: Diagnosis not present

## 2020-10-16 DIAGNOSIS — M25561 Pain in right knee: Secondary | ICD-10-CM | POA: Diagnosis not present

## 2020-10-16 DIAGNOSIS — R41841 Cognitive communication deficit: Secondary | ICD-10-CM | POA: Diagnosis not present

## 2020-10-16 DIAGNOSIS — Z9181 History of falling: Secondary | ICD-10-CM | POA: Diagnosis not present

## 2020-10-16 DIAGNOSIS — M6281 Muscle weakness (generalized): Secondary | ICD-10-CM | POA: Diagnosis not present

## 2020-10-16 DIAGNOSIS — R2689 Other abnormalities of gait and mobility: Secondary | ICD-10-CM | POA: Diagnosis not present

## 2020-10-16 DIAGNOSIS — I509 Heart failure, unspecified: Secondary | ICD-10-CM | POA: Diagnosis not present

## 2020-10-18 ENCOUNTER — Telehealth: Payer: Self-pay

## 2020-10-18 DIAGNOSIS — I509 Heart failure, unspecified: Secondary | ICD-10-CM | POA: Diagnosis not present

## 2020-10-18 DIAGNOSIS — R2689 Other abnormalities of gait and mobility: Secondary | ICD-10-CM | POA: Diagnosis not present

## 2020-10-18 DIAGNOSIS — R41841 Cognitive communication deficit: Secondary | ICD-10-CM | POA: Diagnosis not present

## 2020-10-18 DIAGNOSIS — M6281 Muscle weakness (generalized): Secondary | ICD-10-CM | POA: Diagnosis not present

## 2020-10-18 DIAGNOSIS — M25561 Pain in right knee: Secondary | ICD-10-CM | POA: Diagnosis not present

## 2020-10-18 DIAGNOSIS — Z9181 History of falling: Secondary | ICD-10-CM | POA: Diagnosis not present

## 2020-10-18 NOTE — Telephone Encounter (Signed)
Pt Moved from independent living to assisted living at [facility]. Called spoke with Melissa RN at [facility] they are now managing and monitoring pt's INR and dosing her Warfarin at their facility. 

## 2020-10-21 DIAGNOSIS — M25561 Pain in right knee: Secondary | ICD-10-CM | POA: Diagnosis not present

## 2020-10-21 DIAGNOSIS — R2689 Other abnormalities of gait and mobility: Secondary | ICD-10-CM | POA: Diagnosis not present

## 2020-10-21 DIAGNOSIS — Z9181 History of falling: Secondary | ICD-10-CM | POA: Diagnosis not present

## 2020-10-21 DIAGNOSIS — R41841 Cognitive communication deficit: Secondary | ICD-10-CM | POA: Diagnosis not present

## 2020-10-21 DIAGNOSIS — I509 Heart failure, unspecified: Secondary | ICD-10-CM | POA: Diagnosis not present

## 2020-10-21 DIAGNOSIS — M6281 Muscle weakness (generalized): Secondary | ICD-10-CM | POA: Diagnosis not present

## 2020-10-23 DIAGNOSIS — Z9181 History of falling: Secondary | ICD-10-CM | POA: Diagnosis not present

## 2020-10-23 DIAGNOSIS — M25561 Pain in right knee: Secondary | ICD-10-CM | POA: Diagnosis not present

## 2020-10-23 DIAGNOSIS — M6281 Muscle weakness (generalized): Secondary | ICD-10-CM | POA: Diagnosis not present

## 2020-10-23 DIAGNOSIS — R2689 Other abnormalities of gait and mobility: Secondary | ICD-10-CM | POA: Diagnosis not present

## 2020-10-23 DIAGNOSIS — R41841 Cognitive communication deficit: Secondary | ICD-10-CM | POA: Diagnosis not present

## 2020-10-23 DIAGNOSIS — I509 Heart failure, unspecified: Secondary | ICD-10-CM | POA: Diagnosis not present

## 2020-10-24 DIAGNOSIS — I509 Heart failure, unspecified: Secondary | ICD-10-CM | POA: Diagnosis not present

## 2020-10-24 DIAGNOSIS — M6281 Muscle weakness (generalized): Secondary | ICD-10-CM | POA: Diagnosis not present

## 2020-10-24 DIAGNOSIS — M25561 Pain in right knee: Secondary | ICD-10-CM | POA: Diagnosis not present

## 2020-10-24 DIAGNOSIS — Z9181 History of falling: Secondary | ICD-10-CM | POA: Diagnosis not present

## 2020-10-24 DIAGNOSIS — R41841 Cognitive communication deficit: Secondary | ICD-10-CM | POA: Diagnosis not present

## 2020-10-24 DIAGNOSIS — R2689 Other abnormalities of gait and mobility: Secondary | ICD-10-CM | POA: Diagnosis not present

## 2020-10-25 DIAGNOSIS — M25561 Pain in right knee: Secondary | ICD-10-CM | POA: Diagnosis not present

## 2020-10-25 DIAGNOSIS — I509 Heart failure, unspecified: Secondary | ICD-10-CM | POA: Diagnosis not present

## 2020-10-25 DIAGNOSIS — Z9181 History of falling: Secondary | ICD-10-CM | POA: Diagnosis not present

## 2020-10-25 DIAGNOSIS — R41841 Cognitive communication deficit: Secondary | ICD-10-CM | POA: Diagnosis not present

## 2020-10-25 DIAGNOSIS — R2689 Other abnormalities of gait and mobility: Secondary | ICD-10-CM | POA: Diagnosis not present

## 2020-10-25 DIAGNOSIS — M6281 Muscle weakness (generalized): Secondary | ICD-10-CM | POA: Diagnosis not present

## 2020-10-28 DIAGNOSIS — R41841 Cognitive communication deficit: Secondary | ICD-10-CM | POA: Diagnosis not present

## 2020-10-28 DIAGNOSIS — M6281 Muscle weakness (generalized): Secondary | ICD-10-CM | POA: Diagnosis not present

## 2020-10-28 DIAGNOSIS — I509 Heart failure, unspecified: Secondary | ICD-10-CM | POA: Diagnosis not present

## 2020-10-28 DIAGNOSIS — Z9181 History of falling: Secondary | ICD-10-CM | POA: Diagnosis not present

## 2020-10-28 DIAGNOSIS — R2689 Other abnormalities of gait and mobility: Secondary | ICD-10-CM | POA: Diagnosis not present

## 2020-10-28 DIAGNOSIS — M25561 Pain in right knee: Secondary | ICD-10-CM | POA: Diagnosis not present

## 2020-10-29 DIAGNOSIS — Z9181 History of falling: Secondary | ICD-10-CM | POA: Diagnosis not present

## 2020-10-29 DIAGNOSIS — R41841 Cognitive communication deficit: Secondary | ICD-10-CM | POA: Diagnosis not present

## 2020-10-29 DIAGNOSIS — M25561 Pain in right knee: Secondary | ICD-10-CM | POA: Diagnosis not present

## 2020-10-29 DIAGNOSIS — I509 Heart failure, unspecified: Secondary | ICD-10-CM | POA: Diagnosis not present

## 2020-10-29 DIAGNOSIS — R2689 Other abnormalities of gait and mobility: Secondary | ICD-10-CM | POA: Diagnosis not present

## 2020-10-29 DIAGNOSIS — M6281 Muscle weakness (generalized): Secondary | ICD-10-CM | POA: Diagnosis not present

## 2020-10-30 DIAGNOSIS — R2689 Other abnormalities of gait and mobility: Secondary | ICD-10-CM | POA: Diagnosis not present

## 2020-10-30 DIAGNOSIS — M25561 Pain in right knee: Secondary | ICD-10-CM | POA: Diagnosis not present

## 2020-10-30 DIAGNOSIS — Z9181 History of falling: Secondary | ICD-10-CM | POA: Diagnosis not present

## 2020-10-30 DIAGNOSIS — M6281 Muscle weakness (generalized): Secondary | ICD-10-CM | POA: Diagnosis not present

## 2020-10-30 DIAGNOSIS — I509 Heart failure, unspecified: Secondary | ICD-10-CM | POA: Diagnosis not present

## 2020-10-30 DIAGNOSIS — R41841 Cognitive communication deficit: Secondary | ICD-10-CM | POA: Diagnosis not present

## 2020-10-31 DIAGNOSIS — I509 Heart failure, unspecified: Secondary | ICD-10-CM | POA: Diagnosis not present

## 2020-10-31 DIAGNOSIS — M6281 Muscle weakness (generalized): Secondary | ICD-10-CM | POA: Diagnosis not present

## 2020-10-31 DIAGNOSIS — Z9181 History of falling: Secondary | ICD-10-CM | POA: Diagnosis not present

## 2020-10-31 DIAGNOSIS — R2689 Other abnormalities of gait and mobility: Secondary | ICD-10-CM | POA: Diagnosis not present

## 2020-10-31 DIAGNOSIS — M25561 Pain in right knee: Secondary | ICD-10-CM | POA: Diagnosis not present

## 2020-10-31 DIAGNOSIS — R41841 Cognitive communication deficit: Secondary | ICD-10-CM | POA: Diagnosis not present

## 2020-11-01 DIAGNOSIS — M25561 Pain in right knee: Secondary | ICD-10-CM | POA: Diagnosis not present

## 2020-11-01 DIAGNOSIS — M6281 Muscle weakness (generalized): Secondary | ICD-10-CM | POA: Diagnosis not present

## 2020-11-01 DIAGNOSIS — R41841 Cognitive communication deficit: Secondary | ICD-10-CM | POA: Diagnosis not present

## 2020-11-01 DIAGNOSIS — Z9181 History of falling: Secondary | ICD-10-CM | POA: Diagnosis not present

## 2020-11-01 DIAGNOSIS — R2689 Other abnormalities of gait and mobility: Secondary | ICD-10-CM | POA: Diagnosis not present

## 2020-11-01 DIAGNOSIS — I509 Heart failure, unspecified: Secondary | ICD-10-CM | POA: Diagnosis not present

## 2020-11-04 DIAGNOSIS — R41841 Cognitive communication deficit: Secondary | ICD-10-CM | POA: Diagnosis not present

## 2020-11-04 DIAGNOSIS — M25561 Pain in right knee: Secondary | ICD-10-CM | POA: Diagnosis not present

## 2020-11-04 DIAGNOSIS — Z9181 History of falling: Secondary | ICD-10-CM | POA: Diagnosis not present

## 2020-11-04 DIAGNOSIS — I509 Heart failure, unspecified: Secondary | ICD-10-CM | POA: Diagnosis not present

## 2020-11-04 DIAGNOSIS — M6281 Muscle weakness (generalized): Secondary | ICD-10-CM | POA: Diagnosis not present

## 2020-11-04 DIAGNOSIS — R2689 Other abnormalities of gait and mobility: Secondary | ICD-10-CM | POA: Diagnosis not present

## 2020-11-05 DIAGNOSIS — M25561 Pain in right knee: Secondary | ICD-10-CM | POA: Diagnosis not present

## 2020-11-05 DIAGNOSIS — Z9181 History of falling: Secondary | ICD-10-CM | POA: Diagnosis not present

## 2020-11-05 DIAGNOSIS — R41841 Cognitive communication deficit: Secondary | ICD-10-CM | POA: Diagnosis not present

## 2020-11-05 DIAGNOSIS — I509 Heart failure, unspecified: Secondary | ICD-10-CM | POA: Diagnosis not present

## 2020-11-05 DIAGNOSIS — R2689 Other abnormalities of gait and mobility: Secondary | ICD-10-CM | POA: Diagnosis not present

## 2020-11-05 DIAGNOSIS — M6281 Muscle weakness (generalized): Secondary | ICD-10-CM | POA: Diagnosis not present

## 2020-11-06 DIAGNOSIS — M25561 Pain in right knee: Secondary | ICD-10-CM | POA: Diagnosis not present

## 2020-11-06 DIAGNOSIS — I509 Heart failure, unspecified: Secondary | ICD-10-CM | POA: Diagnosis not present

## 2020-11-06 DIAGNOSIS — M6281 Muscle weakness (generalized): Secondary | ICD-10-CM | POA: Diagnosis not present

## 2020-11-06 DIAGNOSIS — Z9181 History of falling: Secondary | ICD-10-CM | POA: Diagnosis not present

## 2020-11-06 DIAGNOSIS — R41841 Cognitive communication deficit: Secondary | ICD-10-CM | POA: Diagnosis not present

## 2020-11-06 DIAGNOSIS — R2689 Other abnormalities of gait and mobility: Secondary | ICD-10-CM | POA: Diagnosis not present

## 2020-11-07 DIAGNOSIS — M25561 Pain in right knee: Secondary | ICD-10-CM | POA: Diagnosis not present

## 2020-11-07 DIAGNOSIS — R2689 Other abnormalities of gait and mobility: Secondary | ICD-10-CM | POA: Diagnosis not present

## 2020-11-07 DIAGNOSIS — Z9181 History of falling: Secondary | ICD-10-CM | POA: Diagnosis not present

## 2020-11-07 DIAGNOSIS — M6281 Muscle weakness (generalized): Secondary | ICD-10-CM | POA: Diagnosis not present

## 2020-11-07 DIAGNOSIS — R41841 Cognitive communication deficit: Secondary | ICD-10-CM | POA: Diagnosis not present

## 2020-11-07 DIAGNOSIS — I509 Heart failure, unspecified: Secondary | ICD-10-CM | POA: Diagnosis not present

## 2020-11-19 ENCOUNTER — Other Ambulatory Visit: Payer: Self-pay

## 2020-11-19 NOTE — Telephone Encounter (Signed)
Refill request received from pharmacy for Diazepam 5 mg tablet daily. Medication pended and sent to Dr. Lyndel Safe for approval.

## 2020-11-20 MED ORDER — DIAZEPAM 5 MG PO TABS: 5.0000 mg | ORAL_TABLET | Freq: Every day | ORAL | 3 refills | Status: DC

## 2020-12-05 ENCOUNTER — Other Ambulatory Visit: Payer: Self-pay

## 2020-12-05 DIAGNOSIS — I5032 Chronic diastolic (congestive) heart failure: Secondary | ICD-10-CM | POA: Diagnosis not present

## 2020-12-05 DIAGNOSIS — E782 Mixed hyperlipidemia: Secondary | ICD-10-CM

## 2020-12-05 DIAGNOSIS — E039 Hypothyroidism, unspecified: Secondary | ICD-10-CM | POA: Diagnosis not present

## 2020-12-05 LAB — LIPID PANEL
Cholesterol: 129 (ref 0–200)
HDL: 46 (ref 35–70)
LDL Cholesterol: 68
LDl/HDL Ratio: 2.8
Triglycerides: 70 (ref 40–160)

## 2020-12-05 LAB — COMPREHENSIVE METABOLIC PANEL
Albumin: 3.4 — AB (ref 3.5–5.0)
Calcium: 8.6 — AB (ref 8.7–10.7)
Globulin: 2.6

## 2020-12-05 LAB — TSH: TSH: 5.75 (ref 0.41–5.90)

## 2020-12-05 LAB — BASIC METABOLIC PANEL
BUN: 63 — AB (ref 4–21)
CO2: 31 — AB (ref 13–22)
Chloride: 99 (ref 99–108)
Creatinine: 2.4 — AB (ref 0.6–1.3)
Glucose: 89
Potassium: 3.9 (ref 3.4–5.3)
Sodium: 138 (ref 137–147)

## 2020-12-05 LAB — CBC AND DIFFERENTIAL
HCT: 32 — AB (ref 41–53)
Hemoglobin: 10.3 — AB (ref 13.5–17.5)
Neutrophils Absolute: 2654
Platelets: 101 — AB (ref 150–399)
WBC: 4.8

## 2020-12-05 LAB — HEPATIC FUNCTION PANEL
ALT: 13 (ref 10–40)
AST: 17 (ref 14–40)
Alkaline Phosphatase: 69 (ref 25–125)
Bilirubin, Total: 0.5

## 2020-12-05 LAB — CBC: RBC: 3.42 — AB (ref 3.87–5.11)

## 2020-12-06 ENCOUNTER — Non-Acute Institutional Stay: Payer: Medicare Other | Admitting: Orthopedic Surgery

## 2020-12-06 ENCOUNTER — Encounter: Payer: Self-pay | Admitting: Orthopedic Surgery

## 2020-12-06 DIAGNOSIS — Z66 Do not resuscitate: Secondary | ICD-10-CM

## 2020-12-06 DIAGNOSIS — I5032 Chronic diastolic (congestive) heart failure: Secondary | ICD-10-CM

## 2020-12-06 MED ORDER — FUROSEMIDE 20 MG PO TABS: 20.0000 mg | ORAL_TABLET | ORAL | 3 refills | Status: DC

## 2020-12-06 MED ORDER — FUROSEMIDE 40 MG PO TABS: ORAL_TABLET | ORAL | 1 refills | Status: DC
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
## 2020-12-06 NOTE — Addendum Note (Signed)
Addended byWindell Moulding E on: 12/06/2020 12:56 PM   Modules accepted: Level of Service

## 2020-12-11 ENCOUNTER — Encounter: Payer: Self-pay | Admitting: Internal Medicine

## 2020-12-19 DIAGNOSIS — I10 Essential (primary) hypertension: Secondary | ICD-10-CM | POA: Diagnosis not present

## 2020-12-31 ENCOUNTER — Inpatient Hospital Stay (HOSPITAL_COMMUNITY)
Admission: EM | Admit: 2020-12-31 | Discharge: 2021-01-08 | DRG: 291 | Disposition: A | Discharge status: Home Health | Payer: Medicare Other | Source: Skilled Nursing Facility | Attending: Internal Medicine | Admitting: Internal Medicine

## 2020-12-31 ENCOUNTER — Emergency Department (HOSPITAL_COMMUNITY): Payer: Medicare Other

## 2020-12-31 ENCOUNTER — Other Ambulatory Visit: Payer: Self-pay

## 2020-12-31 ENCOUNTER — Encounter (HOSPITAL_COMMUNITY): Payer: Self-pay

## 2020-12-31 DIAGNOSIS — I5033 Acute on chronic diastolic (congestive) heart failure: Principal | ICD-10-CM

## 2020-12-31 DIAGNOSIS — I509 Heart failure, unspecified: Secondary | ICD-10-CM

## 2020-12-31 DIAGNOSIS — E039 Hypothyroidism, unspecified: Secondary | ICD-10-CM

## 2020-12-31 DIAGNOSIS — S0101XA Laceration without foreign body of scalp, initial encounter: Secondary | ICD-10-CM

## 2020-12-31 DIAGNOSIS — I5031 Acute diastolic (congestive) heart failure: Secondary | ICD-10-CM | POA: Diagnosis not present

## 2020-12-31 DIAGNOSIS — R55 Syncope and collapse: Secondary | ICD-10-CM

## 2020-12-31 DIAGNOSIS — N179 Acute kidney failure, unspecified: Secondary | ICD-10-CM

## 2020-12-31 DIAGNOSIS — J9601 Acute respiratory failure with hypoxia: Secondary | ICD-10-CM

## 2020-12-31 DIAGNOSIS — I4891 Unspecified atrial fibrillation: Secondary | ICD-10-CM

## 2020-12-31 DIAGNOSIS — E785 Hyperlipidemia, unspecified: Secondary | ICD-10-CM

## 2020-12-31 DIAGNOSIS — Z7901 Long term (current) use of anticoagulants: Secondary | ICD-10-CM

## 2020-12-31 DIAGNOSIS — J849 Interstitial pulmonary disease, unspecified: Secondary | ICD-10-CM | POA: Diagnosis present

## 2020-12-31 DIAGNOSIS — N183 Chronic kidney disease, stage 3 unspecified: Secondary | ICD-10-CM | POA: Diagnosis not present

## 2020-12-31 DIAGNOSIS — Z66 Do not resuscitate: Secondary | ICD-10-CM | POA: Diagnosis present

## 2020-12-31 DIAGNOSIS — I110 Hypertensive heart disease with heart failure: Secondary | ICD-10-CM | POA: Diagnosis not present

## 2020-12-31 DIAGNOSIS — D631 Anemia in chronic kidney disease: Secondary | ICD-10-CM | POA: Diagnosis present

## 2020-12-31 DIAGNOSIS — Y92092 Bedroom in other non-institutional residence as the place of occurrence of the external cause: Secondary | ICD-10-CM | POA: Diagnosis not present

## 2020-12-31 DIAGNOSIS — K5900 Constipation, unspecified: Secondary | ICD-10-CM | POA: Diagnosis present

## 2020-12-31 DIAGNOSIS — Z20822 Contact with and (suspected) exposure to covid-19: Secondary | ICD-10-CM | POA: Diagnosis present

## 2020-12-31 DIAGNOSIS — I130 Hypertensive heart and chronic kidney disease with heart failure and stage 1 through stage 4 chronic kidney disease, or unspecified chronic kidney disease: Secondary | ICD-10-CM | POA: Diagnosis present

## 2020-12-31 DIAGNOSIS — I77810 Thoracic aortic ectasia: Secondary | ICD-10-CM | POA: Diagnosis present

## 2020-12-31 DIAGNOSIS — I5043 Acute on chronic combined systolic (congestive) and diastolic (congestive) heart failure: Secondary | ICD-10-CM | POA: Diagnosis present

## 2020-12-31 DIAGNOSIS — Z043 Encounter for examination and observation following other accident: Secondary | ICD-10-CM | POA: Diagnosis not present

## 2020-12-31 DIAGNOSIS — I429 Cardiomyopathy, unspecified: Secondary | ICD-10-CM | POA: Diagnosis present

## 2020-12-31 DIAGNOSIS — Z951 Presence of aortocoronary bypass graft: Secondary | ICD-10-CM

## 2020-12-31 DIAGNOSIS — I5041 Acute combined systolic (congestive) and diastolic (congestive) heart failure: Secondary | ICD-10-CM | POA: Diagnosis not present

## 2020-12-31 DIAGNOSIS — N184 Chronic kidney disease, stage 4 (severe): Secondary | ICD-10-CM | POA: Diagnosis present

## 2020-12-31 DIAGNOSIS — R21 Rash and other nonspecific skin eruption: Secondary | ICD-10-CM | POA: Diagnosis not present

## 2020-12-31 DIAGNOSIS — W1839XA Other fall on same level, initial encounter: Secondary | ICD-10-CM | POA: Diagnosis present

## 2020-12-31 DIAGNOSIS — N170 Acute kidney failure with tubular necrosis: Secondary | ICD-10-CM | POA: Diagnosis not present

## 2020-12-31 DIAGNOSIS — R519 Headache, unspecified: Secondary | ICD-10-CM | POA: Diagnosis not present

## 2020-12-31 DIAGNOSIS — J811 Chronic pulmonary edema: Secondary | ICD-10-CM | POA: Diagnosis not present

## 2020-12-31 DIAGNOSIS — I4821 Permanent atrial fibrillation: Secondary | ICD-10-CM | POA: Diagnosis present

## 2020-12-31 DIAGNOSIS — K219 Gastro-esophageal reflux disease without esophagitis: Secondary | ICD-10-CM | POA: Diagnosis present

## 2020-12-31 DIAGNOSIS — Z79899 Other long term (current) drug therapy: Secondary | ICD-10-CM

## 2020-12-31 DIAGNOSIS — I2510 Atherosclerotic heart disease of native coronary artery without angina pectoris: Secondary | ICD-10-CM | POA: Diagnosis present

## 2020-12-31 DIAGNOSIS — I5021 Acute systolic (congestive) heart failure: Secondary | ICD-10-CM | POA: Diagnosis not present

## 2020-12-31 DIAGNOSIS — M47812 Spondylosis without myelopathy or radiculopathy, cervical region: Secondary | ICD-10-CM | POA: Diagnosis not present

## 2020-12-31 DIAGNOSIS — I480 Paroxysmal atrial fibrillation: Secondary | ICD-10-CM | POA: Diagnosis not present

## 2020-12-31 DIAGNOSIS — F32A Depression, unspecified: Secondary | ICD-10-CM | POA: Diagnosis present

## 2020-12-31 DIAGNOSIS — W19XXXA Unspecified fall, initial encounter: Secondary | ICD-10-CM | POA: Diagnosis not present

## 2020-12-31 DIAGNOSIS — N189 Chronic kidney disease, unspecified: Secondary | ICD-10-CM

## 2020-12-31 DIAGNOSIS — Z23 Encounter for immunization: Secondary | ICD-10-CM

## 2020-12-31 DIAGNOSIS — J90 Pleural effusion, not elsewhere classified: Secondary | ICD-10-CM | POA: Diagnosis not present

## 2020-12-31 DIAGNOSIS — I4819 Other persistent atrial fibrillation: Secondary | ICD-10-CM | POA: Diagnosis not present

## 2020-12-31 DIAGNOSIS — F419 Anxiety disorder, unspecified: Secondary | ICD-10-CM | POA: Diagnosis present

## 2020-12-31 DIAGNOSIS — S199XXA Unspecified injury of neck, initial encounter: Secondary | ICD-10-CM | POA: Diagnosis not present

## 2020-12-31 HISTORY — DX: Unspecified atrial fibrillation: I48.91

## 2020-12-31 LAB — RESP PANEL BY RT-PCR (FLU A&B, COVID) ARPGX2
Influenza A by PCR: NEGATIVE
Influenza B by PCR: NEGATIVE
SARS Coronavirus 2 by RT PCR: NEGATIVE

## 2020-12-31 LAB — CBC WITH DIFFERENTIAL/PLATELET
Abs Immature Granulocytes: 0.1 10*3/uL — ABNORMAL HIGH (ref 0.00–0.07)
Basophils Absolute: 0.1 10*3/uL (ref 0.0–0.1)
Basophils Relative: 1 %
Eosinophils Absolute: 0.2 10*3/uL (ref 0.0–0.5)
Eosinophils Relative: 2 %
HCT: 37.1 % — ABNORMAL LOW (ref 39.0–52.0)
Hemoglobin: 11.8 g/dL — ABNORMAL LOW (ref 13.0–17.0)
Immature Granulocytes: 1 %
Lymphocytes Relative: 16 %
Lymphs Abs: 1.3 10*3/uL (ref 0.7–4.0)
MCH: 30.6 pg (ref 26.0–34.0)
MCHC: 31.8 g/dL (ref 30.0–36.0)
MCV: 96.1 fL (ref 80.0–100.0)
Monocytes Absolute: 0.6 10*3/uL (ref 0.1–1.0)
Monocytes Relative: 8 %
Neutro Abs: 5.6 10*3/uL (ref 1.7–7.7)
Neutrophils Relative %: 72 %
Platelets: 125 10*3/uL — ABNORMAL LOW (ref 150–400)
RBC: 3.86 MIL/uL — ABNORMAL LOW (ref 4.22–5.81)
RDW: 17.4 % — ABNORMAL HIGH (ref 11.5–15.5)
WBC: 7.8 10*3/uL (ref 4.0–10.5)
nRBC: 0 % (ref 0.0–0.2)

## 2020-12-31 LAB — COMPREHENSIVE METABOLIC PANEL
ALT: 23 U/L (ref 0–44)
AST: 29 U/L (ref 15–41)
Albumin: 3.6 g/dL (ref 3.5–5.0)
Alkaline Phosphatase: 82 U/L (ref 38–126)
Anion gap: 11 (ref 5–15)
BUN: 48 mg/dL — ABNORMAL HIGH (ref 8–23)
CO2: 27 mmol/L (ref 22–32)
Calcium: 8.9 mg/dL (ref 8.9–10.3)
Chloride: 97 mmol/L — ABNORMAL LOW (ref 98–111)
Creatinine, Ser: 2.55 mg/dL — ABNORMAL HIGH (ref 0.61–1.24)
GFR, Estimated: 22 mL/min — ABNORMAL LOW (ref >60)
Glucose, Bld: 146 mg/dL — ABNORMAL HIGH (ref 70–99)
Potassium: 4.5 mmol/L (ref 3.5–5.1)
Sodium: 135 mmol/L (ref 135–145)
Total Bilirubin: 1 mg/dL (ref 0.3–1.2)
Total Protein: 6.6 g/dL (ref 6.5–8.1)

## 2020-12-31 LAB — TROPONIN I (HIGH SENSITIVITY)
Troponin I (High Sensitivity): 104 ng/L (ref <18)
Troponin I (High Sensitivity): 107 ng/L (ref <18)

## 2020-12-31 LAB — PROTIME-INR
INR: 2.2 — ABNORMAL HIGH (ref 0.8–1.2)
Prothrombin Time: 24.2 seconds — ABNORMAL HIGH (ref 11.4–15.2)

## 2020-12-31 LAB — BRAIN NATRIURETIC PEPTIDE: B Natriuretic Peptide: 749.5 pg/mL — ABNORMAL HIGH (ref 0.0–100.0)

## 2020-12-31 MED ORDER — DIAZEPAM 5 MG PO TABS
2.5000 mg | ORAL_TABLET | Freq: Four times a day (QID) | ORAL | Status: DC | PRN
  Administered 2020-12-31 – 2021-01-07 (×7): 2.5 mg via ORAL
  Filled 2020-12-31 (×7): qty 1

## 2020-12-31 MED ORDER — SIMVASTATIN 20 MG PO TABS
10.0000 mg | ORAL_TABLET | Freq: Every day | ORAL | Status: DC
  Administered 2021-01-01 – 2021-01-08 (×8): 10 mg via ORAL
  Filled 2020-12-31 (×8): qty 1

## 2020-12-31 MED ORDER — WARFARIN 1.25 MG HALF TABLET
1.2500 mg | ORAL_TABLET | Freq: Once | ORAL | Status: AC
  Administered 2020-12-31: 1.25 mg via ORAL
  Filled 2020-12-31: qty 1

## 2020-12-31 MED ORDER — SERTRALINE HCL 50 MG PO TABS
25.0000 mg | ORAL_TABLET | Freq: Two times a day (BID) | ORAL | Status: DC
  Administered 2020-12-31 – 2021-01-08 (×16): 25 mg via ORAL
  Filled 2020-12-31 (×17): qty 1

## 2020-12-31 MED ORDER — FUROSEMIDE 10 MG/ML IJ SOLN
40.0000 mg | Freq: Two times a day (BID) | INTRAMUSCULAR | Status: DC
  Administered 2021-01-01 – 2021-01-03 (×6): 40 mg via INTRAVENOUS
  Filled 2020-12-31 (×6): qty 4

## 2020-12-31 MED ORDER — VITAMIN D 25 MCG (1000 UNIT) PO TABS
1000.0000 [IU] | ORAL_TABLET | Freq: Every day | ORAL | Status: DC
  Administered 2021-01-01 – 2021-01-08 (×8): 1000 [IU] via ORAL
  Filled 2020-12-31 (×8): qty 1

## 2020-12-31 MED ORDER — PANTOPRAZOLE SODIUM 40 MG PO TBEC
40.0000 mg | DELAYED_RELEASE_TABLET | Freq: Every day | ORAL | Status: DC
  Administered 2021-01-01 – 2021-01-08 (×8): 40 mg via ORAL
  Filled 2020-12-31 (×8): qty 1

## 2020-12-31 MED ORDER — FUROSEMIDE 10 MG/ML IJ SOLN
40.0000 mg | Freq: Once | INTRAMUSCULAR | Status: AC
  Administered 2020-12-31: 40 mg via INTRAVENOUS
  Filled 2020-12-31: qty 4

## 2020-12-31 MED ORDER — WARFARIN - PHARMACIST DOSING INPATIENT
Freq: Every day | Status: DC
  Administered 2021-01-04: 1

## 2020-12-31 MED ORDER — METOPROLOL SUCCINATE ER 25 MG PO TB24
25.0000 mg | ORAL_TABLET | Freq: Every day | ORAL | Status: DC
  Administered 2021-01-01 – 2021-01-08 (×8): 25 mg via ORAL
  Filled 2020-12-31 (×8): qty 1

## 2020-12-31 MED ORDER — LEVOTHYROXINE SODIUM 88 MCG PO TABS
88.0000 ug | ORAL_TABLET | Freq: Every day | ORAL | Status: DC
  Administered 2021-01-01 – 2021-01-04 (×4): 88 ug via ORAL
  Filled 2020-12-31 (×4): qty 1

## 2020-12-31 MED ORDER — TETANUS-DIPHTH-ACELL PERTUSSIS 5-2.5-18.5 LF-MCG/0.5 IM SUSY
0.5000 mL | PREFILLED_SYRINGE | Freq: Once | INTRAMUSCULAR | Status: AC
  Administered 2020-12-31: 0.5 mL via INTRAMUSCULAR
  Filled 2020-12-31: qty 0.5

## 2020-12-31 MED ORDER — LORATADINE 10 MG PO TABS
10.0000 mg | ORAL_TABLET | Freq: Every day | ORAL | Status: DC
  Administered 2021-01-01 – 2021-01-08 (×8): 10 mg via ORAL
  Filled 2020-12-31 (×8): qty 1

## 2020-12-31 MED ORDER — ASPIRIN 325 MG PO TABS
325.0000 mg | ORAL_TABLET | Freq: Once | ORAL | Status: AC
  Administered 2020-12-31: 325 mg via ORAL
  Filled 2020-12-31: qty 1

## 2020-12-31 NOTE — ED Provider Notes (Signed)
[facility] Provider Note   CSN: [id number] Arrival date & time: 12/31/20  1621     History Chief Complaint  Patient presents with  . Trauma    [name] is a 87 y.o. male hx of afib on coumadin, HTN, here with fall. Patient was trying to plug in his iPad and states that he lost his balance and hit his head on the corner of the nightstand. He was noted to have a laceration in the scalp area.  Patient states that he did not passed out and did not have any chest pain.  Patient states that his leg swelling is chronic.  He denies any back pain or any other injuries.   The history is provided by the patient.       Past Medical History:  Diagnosis Date  . Atrial fibrillation ([place])     There are no problems to display for this patient.     No family history on file.     Home Medications Prior to Admission medications   Not on File    Allergies    Penicillins  Review of Systems   Review of Systems  Skin: Positive for wound.  All other systems reviewed and are negative.   Physical Exam Updated Vital Signs BP 111/65 (BP Location: Left Arm)   Pulse 69   Temp 97.7 F (36.5 C) (Oral)   Resp (!) 23   Ht 5' (1.524 m)   Wt 68 kg   SpO2 99%   BMI 29.29 kg/m   Physical Exam Vitals and nursing note reviewed.  HENT:     Head: Normocephalic.     Comments: 5 cm laceration     Nose: Nose normal.     Mouth/Throat:     Mouth: Mucous membranes are moist.  Eyes:     Extraocular Movements: Extraocular movements intact.     Pupils: Pupils are equal, round, and reactive to light.  Neck:     Comments: C collar in place  Cardiovascular:     Rate and Rhythm: Normal rate and regular rhythm.     Pulses: Normal pulses.     Heart sounds: Normal heart sounds.  Pulmonary:     Effort: Pulmonary effort is normal.     Comments: Diminished bilateral bases Abdominal:     General: Abdomen is flat.     Palpations: Abdomen is soft.   Musculoskeletal:     Comments: 1+ edema bilateral leg  Skin:    General: Skin is warm.     Capillary Refill: Capillary refill takes less than 2 seconds.  Neurological:     General: No focal deficit present.     Mental Status: He is alert and oriented to person, place, and time.  Psychiatric:        Mood and Affect: Mood normal.        Behavior: Behavior normal.     ED Results / Procedures / Treatments   Labs (all labs ordered are listed, but only abnormal results are displayed) Labs Reviewed  CBC WITH DIFFERENTIAL/PLATELET - Abnormal; Notable for the following components:      Result Value   RBC 3.86 (*)    Hemoglobin 11.8 (*)    HCT 37.1 (*)    RDW 17.4 (*)    Platelets 125 (*)    Abs Immature Granulocytes 0.10 (*)    All other components within normal limits  COMPREHENSIVE METABOLIC PANEL - Abnormal; Notable for the following components:  Chloride 97 (*)    Glucose, Bld 146 (*)    BUN 48 (*)    Creatinine, Ser 2.55 (*)    GFR, Estimated 22 (*)    All other components within normal limits  PROTIME-INR - Abnormal; Notable for the following components:   Prothrombin Time 24.2 (*)    INR 2.2 (*)    All other components within normal limits  BRAIN NATRIURETIC PEPTIDE - Abnormal; Notable for the following components:   B Natriuretic Peptide 749.5 (*)    All other components within normal limits  TROPONIN I (HIGH SENSITIVITY) - Abnormal; Notable for the following components:   Troponin I (High Sensitivity) 104 (*)    All other components within normal limits  RESP PANEL BY RT-PCR (FLU A&B, COVID) [id number]  TROPONIN I (HIGH SENSITIVITY)    EKG EKG Interpretation  Date/Time:  Tuesday Dec 31 2020 16:37:17 EDT Ventricular Rate:  85 PR Interval:    QRS Duration: 123 QT Interval:  402 QTC Calculation: 478 R Axis:   256 Text Interpretation: Atrial flutter Nonspecific IVCD with LAD Inferior infarct, old Anterior infarct, old No significant change since last tracing  Confirmed by Wandra Arthurs [phone]) on 12/31/2020 4:42:36 PM   Radiology CT Head Wo Contrast  Result Date: 12/31/2020 CLINICAL DATA:  87 year old male with head trauma. EXAM: CT HEAD WITHOUT CONTRAST CT CERVICAL SPINE WITHOUT CONTRAST TECHNIQUE: Multidetector CT imaging of the head and cervical spine was performed following the standard protocol without intravenous contrast. Multiplanar CT image reconstructions of the cervical spine were also generated. COMPARISON:  None. FINDINGS: CT HEAD FINDINGS Brain: Mild age-related atrophy and chronic microvascular ischemic changes. There is no acute intracranial hemorrhage. No mass effect or midline shift. No extra-axial fluid collection. Vascular: No hyperdense vessel or unexpected calcification. Skull: Normal. Negative for fracture or focal lesion. Sinuses/Orbits: No acute finding. Other: Right posterior parietal scalp laceration. No large hematoma. CT CERVICAL SPINE FINDINGS Alignment: No acute subluxation. Skull base and vertebrae: No acute fracture. Osteopenia. Soft tissues and spinal canal: No prevertebral fluid or swelling. No visible canal hematoma. Disc levels: Multilevel degenerative changes with disc space narrowing and endplate irregularity. Upper chest: Partially visualized bilateral pleural effusions. There is diffuse interstitial and interlobular thickening, likely edema. Clinical correlation is recommended. Other: Bilateral carotid bulb calcified plaques. IMPRESSION: 1. No acute intracranial pathology. Age-related atrophy and chronic microvascular ischemic changes. 2. No acute/traumatic cervical spine pathology. Multilevel degenerative changes. 3. Partially visualized bilateral pleural effusions and findings of pulmonary edema. Clinical correlation is recommended. Electronically Signed   By: Anner Crete M.D.   On: 12/31/2020 17:14   CT Cervical Spine Wo Contrast  Result Date: 12/31/2020 CLINICAL DATA:  87 year old male with head trauma. EXAM: CT  HEAD WITHOUT CONTRAST CT CERVICAL SPINE WITHOUT CONTRAST TECHNIQUE: Multidetector CT imaging of the head and cervical spine was performed following the standard protocol without intravenous contrast. Multiplanar CT image reconstructions of the cervical spine were also generated. COMPARISON:  None. FINDINGS: CT HEAD FINDINGS Brain: Mild age-related atrophy and chronic microvascular ischemic changes. There is no acute intracranial hemorrhage. No mass effect or midline shift. No extra-axial fluid collection. Vascular: No hyperdense vessel or unexpected calcification. Skull: Normal. Negative for fracture or focal lesion. Sinuses/Orbits: No acute finding. Other: Right posterior parietal scalp laceration. No large hematoma. CT CERVICAL SPINE FINDINGS Alignment: No acute subluxation. Skull base and vertebrae: No acute fracture. Osteopenia. Soft tissues and spinal canal: No prevertebral fluid or swelling. No visible canal hematoma. Disc levels: Multilevel  degenerative changes with disc space narrowing and endplate irregularity. Upper chest: Partially visualized bilateral pleural effusions. There is diffuse interstitial and interlobular thickening, likely edema. Clinical correlation is recommended. Other: Bilateral carotid bulb calcified plaques. IMPRESSION: 1. No acute intracranial pathology. Age-related atrophy and chronic microvascular ischemic changes. 2. No acute/traumatic cervical spine pathology. Multilevel degenerative changes. 3. Partially visualized bilateral pleural effusions and findings of pulmonary edema. Clinical correlation is recommended. Electronically Signed   By: Anner Crete M.D.   On: 12/31/2020 17:14   DG Pelvis Portable  Result Date: 12/31/2020 CLINICAL DATA:  Fall. EXAM: PORTABLE PELVIS 1-2 VIEWS COMPARISON:  None. FINDINGS: There is no evidence of pelvic fracture or diastasis. No pelvic bone lesions are seen. IMPRESSION: Negative. Electronically Signed   By: Marijo Conception M.D.   On:  12/31/2020 16:48   DG Chest Port 1 View  Result Date: 12/31/2020 CLINICAL DATA:  87 year old male with fall. EXAM: PORTABLE CHEST 1 VIEW COMPARISON:  Chest radiograph dated 04/17/2020. FINDINGS: Shallow inspiration. Bilateral interstitial coarsening and hazy airspace densities primarily involving the mid to lower lung field and subpleural region of the lungs, progressed since the prior radiograph and may represent progression of underlying fibrosis or sequela of prior inflammatory/infectious process including prior COVID-19 infection. Clinical correlation is recommended. There is no pleural effusion pneumothorax. Stable cardiac silhouette. Median sternotomy wires and CABG vascular clips. No acute osseous pathology. IMPRESSION: Progression of bilateral pulmonary densities, likely worsening fibrosis or sequela of prior inflammatory/infectious process. Electronically Signed   By: Anner Crete M.D.   On: 12/31/2020 16:48    Procedures Procedures   CRITICAL CARE Performed by: Wandra Arthurs   Total critical care time: 30 minutes  Critical care time was exclusive of separately billable procedures and treating other patients.  Critical care was necessary to treat or prevent imminent or life-threatening deterioration.  Critical care was time spent personally by me on the following activities: development of treatment plan with patient and/or surrogate as well as nursing, discussions with consultants, evaluation of patient's response to treatment, examination of patient, obtaining history from patient or surrogate, ordering and performing treatments and interventions, ordering and review of laboratory studies, ordering and review of radiographic studies, pulse oximetry and re-evaluation of patient's condition.   LACERATION REPAIR Performed by: Wandra Arthurs Authorized by: Wandra Arthurs Consent: Verbal consent obtained. Risks and benefits: risks, benefits and alternatives were discussed Consent given  by: patient Patient identity confirmed: provided demographic data Prepped and Draped in normal sterile fashion Wound explored  Laceration Location: R scalp   Laceration Length: 5 cm  No Foreign Bodies seen or palpated  Anesthesia: local infiltration  Local anesthetic: none   Irrigation method: syringe Amount of cleaning: standard  Skin closure: staple   Number of sutures: 3  Technique: staples   Patient tolerance: Patient tolerated the procedure well with no immediate complications.   Medications Ordered in ED Medications  Tdap (BOOSTRIX) injection 0.5 mL (has no administration in time range)  furosemide (LASIX) injection 40 mg (has no administration in time range)  aspirin tablet 325 mg (has no administration in time range)    ED Course  I have reviewed the triage vital signs and the nursing notes.  Pertinent labs & imaging results that were available during my care of the patient were reviewed by me and considered in my medical decision making (see chart for details).    MDM Rules/Calculators/A&P  BLEU [name] is a 87 y.o. male here presenting with fall.  Patient had a mechanical fall and hit his scalp area.  He was a level 2 trauma because he is on Coumadin.  Plan to get CT head and neck.  We will update his tetanus shot.  Patient was noted to be hypoxic oxygen about 86% was put on 4 L.  Patient states that he has no cough or fever.  Patient does have CAD and heart failure.  We will get troponins and BNP and COVID test.   6:47 PM Patient's troponin is 100.  His BNP is 700.  Patient COVID test is negative.  Patient's chest x-ray showed pulmonary edema.  I was able to review his previous records.  Patient has diastolic heart failure.  He has shortness of breath with exertion recently. His Lasix was decreased recently by his doctor due to AKI so I think he is likely volume overloaded.  Given Lasix in the ED. hospitalist to admit for heart  failure exacerbation.  I was able to staple his scalp laceration.  7:00 PM Dr. Johney Frame from cardiology will see patient as consult. Hospitalist to admit.    Final Clinical Impression(s) / ED Diagnoses Final diagnoses:  None    Rx / DC Orders ED Discharge Orders    None       Drenda Freeze, MD 12/31/20 1901

## 2020-12-31 NOTE — Consult Note (Signed)
Cardiology Consultation:   Patient ID: [name] MRN: [id number]; DOB: May 29, 1920  Admit date: 12/31/2020 Date of Consult: 12/31/2020  PCP:  [name], No   [facility] Providers Cardiologist:  None   {    Patient Profile:   [name] is a 87 y.o. male with a hx of CAD s/p CABG in 2005 with redo CABG in 2008 at time of left atrial myxoma resection, permanent afib on warfarin, HTN, CKD, ILD and chronic diastolic heart failure who is being seen 12/31/2020 for the evaluation of acute on chronic diastolic heart failure at the request of [name].  History of Present Illness:   [name] is a 87 year old male with history detailed above who is followed by [name] as an out-patient. He has a known history of CAD s/p CABG in 2005 and redo in 2008 at the time of LA myxoma resection. Also with permanent Afib on warfarin. Last TTE in 2016 showed EF 50-55, normal wall motion, moderate aortic valve calcification, mild AI, ascending aorta at 45 mm (mildly dilated), MAC, mild MR, trivial TR/PI, PASP 39. He was seen as a virtual visit by [name] on 04/2020 where he was doing well with no anginal symptoms.  He presented to the ER today after falling and hitting his head on the nightstand when trying to plug in his iPad. He thinks he may have "blacked out at that time." He was brought to [facility] ER where CT head negative for acute pathology. In the ER, the patient was noted to be hypoxic to 86% with worsening of his chronic LE edema on exam. CXR showed pulmonary edema and BNP elevated at 700. Trop 100. ECG with Afib. Notably, his lasix was decreased at his nursing home recently. Given concern for acute on chronic diastolic heart failure exacerbation, Cardiology was consulted for further evaluation.  Currently, the patient states he is comfortable and denies any dyspnea at rest. He notes worsening SOB with exertion but no orthopnea or PND. Denies any chest pain, lightheadedness, dizziness or  syncope.  He notes that his lasix was decreased because his Cr had gone up. He has noticed that his legs may be a bit more swollen than usual after this change was made. In the ER, the patient received lasix 53m IV.  Past Medical History:  Diagnosis Date  . Atrial fibrillation (HCC)      Home Medications:  Prior to Admission medications   Not on File    Inpatient Medications: Scheduled Meds:  Continuous Infusions:  PRN Meds:   Allergies:    Allergies  Allergen Reactions  . Penicillins     Social History:   Social History   Socioeconomic History  . Marital status: Married    Spouse name: Not on file  . Number of children: Not on file  . Years of education: Not on file  . Highest education level: Not on file  Occupational History  . Not on file  Tobacco Use  . Smoking status: Not on file  . Smokeless tobacco: Not on file  Substance and Sexual Activity  . Alcohol use: Not on file  . Drug use: Not on file  . Sexual activity: Not on file  Other Topics Concern  . Not on file  Social History Narrative  . Not on file   Social Determinants of Health   Financial Resource Strain: Not on file  Food Insecurity: Not on file  Transportation Needs: Not on file  Physical Activity:  Not on file  Stress: Not on file  Social Connections: Not on file  Intimate Partner Violence: Not on file    Family History:   No family history on file.   ROS:  Please see the history of present illness.  Review of Systems  Constitutional: Negative for fever.  HENT: Negative for sore throat.   Eyes: Negative for blurred vision.  Respiratory: Positive for shortness of breath.   Cardiovascular: Positive for leg swelling. Negative for chest pain, palpitations, orthopnea, claudication and PND.  Gastrointestinal: Negative for nausea and vomiting.  Genitourinary: Negative for hematuria.  Musculoskeletal: Positive for falls.  Neurological: Negative for dizziness and loss of consciousness.   Endo/Heme/Allergies: Negative for polydipsia.    Physical Exam/Data:   Vitals:   12/31/20 1800 12/31/20 1815 12/31/20 1830 12/31/20 1845  BP: 128/70 130/70 (!) 144/88 (!) 123/99  Pulse: 80 82 99 85  Resp: (!) 24 (!) 22 (!) 25 (!) 23  Temp:      TempSrc:      SpO2: 91% 97% 97% 98%  Weight:      Height:        Intake/Output Summary (Last 24 hours) at 12/31/2020 2112 Last data filed at 12/31/2020 1650 Gross per 24 hour  Intake 0 ml  Output 0 ml  Net 0 ml   Last 3 Weights 12/31/2020  Weight (lbs) 150 lb  Weight (kg) 68.04 kg     Body mass index is 29.29 kg/m.  General:  Elderly male, sitting up in bed, head lac with staples in place HEENT: normal Neck: JVD to the angle of the mandible Vascular: No carotid bruits; FA pulses 2+ bilaterally without bruits  Cardiac:  Irregularly irregularm, 2/6 systolic murmur Lungs:  Crackles at the bases with inspiratory wheezing Abd: soft, nontender, no hepatomegaly  Ext: 2+ pitting edema with L>R. 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; R RA 1-59; normal L RA  Cardiac Catheterization6/2008 LAD proximal 20-30 then 60 then 70 LCx 60-70 at origin RCA diffusely diseased (20-30) SVG-DX patent SVG-OM occluded LIMA-LAD patent  Laboratory Data:  High Sensitivity Troponin:   Recent Labs  Lab 12/31/20 1646 12/31/20 1846  TROPONINIHS 104* 107*      Chemistry Recent Labs  Lab 12/31/20 1630  NA 135  K 4.5  CL 97*  CO2 27  GLUCOSE 146*  BUN 48*  CREATININE 2.55*  CALCIUM 8.9  GFRNONAA 22*  ANIONGAP 11    Recent Labs  Lab 12/31/20 1630  PROT 6.6  ALBUMIN 3.6  AST 29  ALT 23  ALKPHOS 82  BILITOT 1.0   Hematology Recent Labs  Lab 12/31/20 1630  WBC 7.8  RBC 3.86*  HGB 11.8*  HCT 37.1*  MCV 96.1  MCH 30.6  MCHC 31.8  RDW 17.4*  PLT 125*   BNP Recent Labs  Lab 12/31/20 1646  BNP 749.5*    DDimer No results for input(s): DDIMER in the last 168  hours.   Radiology/Studies:  CT Head Wo Contrast  Result Date: 12/31/2020 CLINICAL DATA:  87 year old male with head trauma. EXAM: CT HEAD WITHOUT CONTRAST CT CERVICAL SPINE WITHOUT CONTRAST TECHNIQUE: Multidetector CT imaging of the head and cervical spine was performed following the standard protocol without intravenous contrast. Multiplanar CT image reconstructions of the cervical spine were also generated. COMPARISON:  None. FINDINGS: CT HEAD FINDINGS Brain: Mild age-related atrophy and chronic microvascular ischemic changes. There is no acute intracranial hemorrhage. No mass effect or midline shift. No extra-axial fluid collection. Vascular: No hyperdense vessel or unexpected calcification. Skull: Normal. Negative for fracture or focal lesion. Sinuses/Orbits: No acute finding. Other: Right posterior parietal scalp laceration. No large hematoma. CT CERVICAL SPINE FINDINGS Alignment: No acute subluxation. Skull base and vertebrae: No acute fracture. Osteopenia. Soft tissues and spinal canal: No prevertebral fluid or swelling. No visible canal hematoma. Disc levels: Multilevel degenerative changes with disc space narrowing and endplate irregularity. Upper chest: Partially visualized bilateral pleural effusions. There is diffuse interstitial and interlobular thickening, likely edema. Clinical correlation is recommended. Other: Bilateral carotid bulb calcified plaques.  IMPRESSION: 1. No acute intracranial pathology. Age-related atrophy and chronic microvascular ischemic changes. 2. No acute/traumatic cervical spine pathology. Multilevel degenerative changes. 3. Partially visualized bilateral pleural effusions and findings of pulmonary edema. Clinical correlation is recommended. Electronically Signed   By: Anner Crete M.D.   On: 12/31/2020 17:14   CT Cervical Spine Wo Contrast  Result Date: 12/31/2020 CLINICAL DATA:  87 year old male with head trauma. EXAM: CT HEAD WITHOUT CONTRAST CT CERVICAL SPINE WITHOUT CONTRAST TECHNIQUE: Multidetector CT imaging of the head and cervical spine was performed following the standard protocol without intravenous contrast. Multiplanar CT image reconstructions of the cervical spine were also generated. COMPARISON:  None. FINDINGS: CT HEAD FINDINGS Brain: Mild age-related atrophy and chronic microvascular ischemic changes. There is no acute intracranial hemorrhage. No mass effect or midline shift. No extra-axial fluid collection. Vascular: No hyperdense vessel or unexpected calcification. Skull: Normal. Negative for fracture or focal lesion. Sinuses/Orbits: No acute finding. Other: Right posterior parietal scalp laceration. No large hematoma. CT CERVICAL SPINE FINDINGS Alignment: No acute subluxation. Skull base and vertebrae: No acute fracture. Osteopenia. Soft tissues and spinal canal: No prevertebral fluid or swelling. No visible canal hematoma. Disc levels: Multilevel degenerative changes with disc space narrowing and endplate irregularity. Upper chest: Partially visualized bilateral pleural effusions. There is diffuse interstitial and interlobular thickening, likely edema. Clinical correlation is recommended. Other: Bilateral carotid bulb calcified plaques. IMPRESSION: 1. No acute intracranial pathology. Age-related atrophy and chronic microvascular ischemic changes. 2. No acute/traumatic cervical spine pathology. Multilevel  degenerative changes. 3. Partially visualized bilateral pleural effusions and findings of pulmonary edema. Clinical correlation is recommended. Electronically Signed   By: Anner Crete M.D.   On: 12/31/2020 17:14   DG Pelvis Portable  Result Date: 12/31/2020 CLINICAL DATA:  Fall. EXAM: PORTABLE PELVIS 1-2 VIEWS COMPARISON:  None. FINDINGS: There is no evidence of pelvic fracture or diastasis. No pelvic bone lesions are seen. IMPRESSION: Negative. Electronically Signed   By: Marijo Conception M.D.   On: 12/31/2020 16:48   DG Chest Port 1 View  Result Date: 12/31/2020 CLINICAL DATA:  87 year old male with fall. EXAM: PORTABLE CHEST 1 VIEW COMPARISON:  Chest radiograph dated 04/17/2020. FINDINGS: Shallow inspiration. Bilateral interstitial coarsening and hazy airspace densities primarily involving the mid to lower lung field and subpleural region of the lungs, progressed since the prior radiograph and may represent progression of underlying fibrosis or  sequela of prior inflammatory/infectious process including prior COVID-19 infection. Clinical correlation is recommended. There is no pleural effusion pneumothorax. Stable cardiac silhouette. Median sternotomy wires and CABG vascular clips. No acute osseous pathology. IMPRESSION: Progression of bilateral pulmonary densities, likely worsening fibrosis or sequela of prior inflammatory/infectious process. Electronically Signed   By: Anner Crete M.D.   On: 12/31/2020 16:48     Assessment and Plan:   #Acute on chronic diastolic heart failure exacerbation: Likely triggered by recent decrease in lasix dosing. Currently with NYHA class II-III symptoms with pulmonary edema on CXR, BNP 700 and significant LE edema (chronic). No chest pain or anginal symptoms with low suspicion of active ischemia. In Afib chronically and rate controlled and therefore unlikely to be trigger of acute decompensation. -Continue lasix 64m IV BID; monitor response and adjust as  needed -Follow-up TTE -Continue metop succinate 237mXL daily -Monitor I/Os and daily weights -Low Na diet  #Possible Syncope vs Mechanical Fall with Head Strike: Patient fell when trying to plug in his iPad. Thought he may have "blacked out" that led to his fall. No preceding chest pain or palpitations. CT head without acute pathology. ECG with afib without ischemia.  -Follow-up TTE -Monitor on telemetry -Management of HF as above -S/p lac repair; management per primary team  #CAD s/p CABG in 2005 and 2008: No anginal symptoms. Trop flat.  -No ASA given need for warfarin -Not on statin and given age, does not need to be initiated at this time  #Permanent Afib: CHADs-vasc 5. On warfarin for AC.  -Continue warfarin; no bleed on CT head -Continue metop as above  #CKD stage IV: -Management per primary -Diuresis as above  #Ascending aorta dilatation: Ascending aorta 45 mm on echocardiogram in 2016. Given his advanced age and comorbid illnesses, he would not be a candidate for surgical repair.  No further testing is warranted at this time.     Risk Assessment/Risk Scores:        New York Heart Association (NYHA) Functional Class NYHA Class II  CHA2DS2-VASc Score = 5  {This indicates a 7.2% annual risk of stroke. The patient's score is based upon: CHF History: Yes HTN History: Yes Diabetes History: No Stroke History: No Vascular Disease History: Yes Age Score: 2 Gender Score: 0        For questions or updates, please contact [facility] consult [email or web address] for contact info under    Signed, HeFreada BergeronMD  12/31/2020 9:12 PM

## 2020-12-31 NOTE — ED Notes (Signed)
Patient transported to CT with TRN.  

## 2020-12-31 NOTE — Progress Notes (Signed)
Orthopedic Tech Progress Note Patient Details:  [name] 05/24/1920 [id number] Level 2 trauma Patient ID: [name], male   DOB: [birth year], 87 y.o.   MRN: [id number]   Janit Pagan 12/31/2020, 5:05 PM

## 2020-12-31 NOTE — Progress Notes (Signed)
ANTICOAGULATION CONSULT NOTE - Initial Consult  Pharmacy Consult for warfarin Indication: atrial fibrillation  Allergies  Allergen Reactions  . Penicillins     Patient Measurements: Height: 5' (152.4 cm) Weight: 68 kg (150 lb) IBW/kg (Calculated) : 50  Vital Signs: Temp: 97.7 F (36.5 C) (05/24 1721) Temp Source: Oral (05/24 1721) BP: 123/99 (05/24 1845) Pulse Rate: 85 (05/24 1845)  Labs: Recent Labs    12/31/20 1630 12/31/20 1646  HGB 11.8*  --   HCT 37.1*  --   PLT 125*  --   LABPROT 24.2*  --   INR 2.2*  --   CREATININE 2.55*  --   TROPONINIHS  --  104*    Estimated Creatinine Clearance: 13.1 mL/min (A) (by C-G formula based on SCr of 2.55 mg/dL (H)).   Medical History: Past Medical History:  Diagnosis Date  . Atrial fibrillation (HCC)     Medications:  (Not in a hospital admission)   Assessment: 70 YOM with h/o Afib on warfarin prior to admission who presented with acute on chronic HF. Pharmacy consulted to resume warfarin. Of note, patient's last dose of warfarin was yesterday.   INR is therapeutic on admission. H/H low, Plt low. SCr 2.55   Home warfarin regimen: 1.25 mg every day   Goal of Therapy:  INR 2-3 Monitor platelets by anticoagulation protocol: Yes   Plan:  -Warfarin 1.25 mg x 1 dose today  -Monitor daily INR -Monitor closely for s/s of bleeding   Albertina Parr, PharmD., BCPS, BCCCP Clinical Pharmacist Please refer to [facility] for unit-specific pharmacist

## 2020-12-31 NOTE — ED Notes (Signed)
Pt's O2 was 86%RA. This RN asked pt if he is on O2 in the assisted living facility, Pt stated "No I do not wear oxygen but I get winded when I use my walker to get around". This RN placed pt on [place] with no change. Pt then placed on 4L [place] brining O2 up to 93%. Darl Householder, MD has been notified.

## 2020-12-31 NOTE — ED Notes (Signed)
Patient denies pain and is resting comfortably.  

## 2020-12-31 NOTE — H&P (Signed)
History and Physical    [name] ZMO:[id number] DOB: Mar 14, 1920 DOA: 12/31/2020  PCP: Pcp, No  Patient coming from: [facility] assisted [facility]  I have personally briefly reviewed patient's old medical records in [place]  Chief Complaint: syncope  HPI: [name] is a 87 y.o. male with medical history significant for atrial fibrillation on Coumadin, chronic diastolic heart failure, CKD stage IV, anemia due to CKD, hyperlipidemia, hypothyroidism depression and anxiety who presents with concerns of syncope and fall.  Patient was trying to plug in an iPad today but suddenly had loss of consciousness and fell backwards hitting his head on a table.  He suffered a laceration to his right posterior head.  He reports that something similar happened about 2 months ago where he was just sitting up in bed and the next thing he can remember was waking up on the floor.  He denies feeling any dizziness or lightheadedness.  No chest pain or palpitations.  No symptoms of any weakness.  In the ED, patient was noted to be hypoxic down to 86% on 4 L. BNP of 749. Troponin of 104.  Patient endorsed that he has been having increasing dyspnea on exertion for the past 2 to 3 months.  Also has been having chronic lower extremity edema around the same time.  He recently had his Lasix dose decreased due to worsening creatinine level.  He was taking 60 mg Lasix daily but was decreased to 40 in the morning and then 20mg  3 times a week instead.  Review of Systems:  Constitutional: No Weight Change, No Fever ENT/Mouth: No sore throat, No Rhinorrhea Eyes: No Eye Pain, No Vision Changes Cardiovascular: No Chest Pain,+ SOB, No PND, No Dyspnea on Exertion, No Orthopnea, No Claudication, + Edema, No Palpitations Respiratory: No Cough, No Sputum, No Wheezing, no Dyspnea  Gastrointestinal: No Nausea, No Vomiting, + Diarrhea, No Constipation, No Pain Genitourinary: no Urinary Incontinence, No  Urgency, No Flank Pain Musculoskeletal: No Arthralgias, No Myalgias Skin: No Skin Lesions, No Pruritus, Neuro: no Weakness, No Numbness, + Loss of Consciousness, + Syncope Psych: No Anxiety/Panic, No Depression, no decrease appetite Heme/Lymph: No Bruising, No Bleeding  Past Medical History:  Diagnosis Date  . Atrial fibrillation ([place])       Social History No tobacco, alcohol or illicit drug use.   Allergies  Allergen Reactions  . Penicillins     Unknown    Mother (Deceased)  Cancer  Father (Deceased)  Diabetes  Sister   Brother (Deceased)  Pulmonary fibrosis  Daughter   Sister (Deceased)  Cancer  Daughter (Deceased)  Cancer  Brother Diabetes      Prior to Admission medications   Not on File    Physical Exam: Vitals:   12/31/20 1815 12/31/20 1830 12/31/20 1845 12/31/20 2130  BP: 130/70 (!) 144/88 (!) 123/99 131/82  Pulse: 82 99 85 98  Resp: (!) 22 (!) 25 (!) 23 (!) 24  Temp:      TempSrc:      SpO2: 97% 97% 98% 100%  Weight:      Height:        Constitutional: NAD, calm, comfortable, thin disheveled appearing elderly male sitting upright in bed Vitals:   12/31/20 1815 12/31/20 1830 12/31/20 1845 12/31/20 2130  BP: 130/70 (!) 144/88 (!) 123/99 131/82  Pulse: 82 99 85 98  Resp: (!) 22 (!) 25 (!) 23 (!) 24  Temp:      TempSrc:  SpO2: 97% 97% 98% 100%  Weight:      Height:       Eyes: PERRL, lids and conjunctivae normal ENMT: Mucous membranes are moist.  Neck: normal, supple, Respiratory: clear to auscultation bilaterally, no wheezing, no crackles. Normal respiratory effort. No accessory muscle use.  Cardiovascular: Irregularly irregular rate and rhythm, no murmurs / rubs / gallops.  Significant +3 edema from bilateral foot up to knee. 2+ pedal pulses.  Abdomen: no tenderness, no masses palpated.  Bowel sounds positive.  Musculoskeletal: no clubbing / cyanosis. No joint deformity upper and lower extremities. Good ROM, no contractures. Normal  muscle tone.  Skin: no rashes, lesions, ulcers. No induration Neurologic: CN 2-12 grossly intact. Sensation intact. Strength 5/5 in all 4.  Psychiatric: Normal judgment and insight. Alert and oriented x 3. Normal mood.     Labs on Admission: I have personally reviewed following labs and imaging studies  CBC: Recent Labs  Lab 12/31/20 1630  WBC 7.8  NEUTROABS 5.6  HGB 11.8*  HCT 37.1*  MCV 96.1  PLT 993*   Basic Metabolic Panel: Recent Labs  Lab 12/31/20 1630  NA 135  K 4.5  CL 97*  CO2 27  GLUCOSE 146*  BUN 48*  CREATININE 2.55*  CALCIUM 8.9   GFR: Estimated Creatinine Clearance: 13.1 mL/min (A) (by C-G formula based on SCr of 2.55 mg/dL (H)). Liver Function Tests: Recent Labs  Lab 12/31/20 1630  AST 29  ALT 23  ALKPHOS 82  BILITOT 1.0  PROT 6.6  ALBUMIN 3.6   No results for input(s): LIPASE, AMYLASE in the last 168 hours. No results for input(s): AMMONIA in the last 168 hours. Coagulation Profile: Recent Labs  Lab 12/31/20 1630  INR 2.2*   Cardiac Enzymes: No results for input(s): CKTOTAL, CKMB, CKMBINDEX, TROPONINI in the last 168 hours. BNP (last 3 results) No results for input(s): PROBNP in the last 8760 hours. HbA1C: No results for input(s): HGBA1C in the last 72 hours. CBG: No results for input(s): GLUCAP in the last 168 hours. Lipid Profile: No results for input(s): CHOL, HDL, LDLCALC, TRIG, CHOLHDL, LDLDIRECT in the last 72 hours. Thyroid Function Tests: No results for input(s): TSH, T4TOTAL, FREET4, T3FREE, THYROIDAB in the last 72 hours. Anemia Panel: No results for input(s): VITAMINB12, FOLATE, FERRITIN, TIBC, IRON, RETICCTPCT in the last 72 hours. Urine analysis: No results found for: COLORURINE, APPEARANCEUR, LABSPEC, [place], GLUCOSEU, HGBUR, BILIRUBINUR, KETONESUR, PROTEINUR, UROBILINOGEN, NITRITE, LEUKOCYTESUR  Radiological Exams on Admission: CT Head Wo Contrast  Result Date: 12/31/2020 CLINICAL DATA:  87 year old male with  head trauma. EXAM: CT HEAD WITHOUT CONTRAST CT CERVICAL SPINE WITHOUT CONTRAST TECHNIQUE: Multidetector CT imaging of the head and cervical spine was performed following the standard protocol without intravenous contrast. Multiplanar CT image reconstructions of the cervical spine were also generated. COMPARISON:  None. FINDINGS: CT HEAD FINDINGS Brain: Mild age-related atrophy and chronic microvascular ischemic changes. There is no acute intracranial hemorrhage. No mass effect or midline shift. No extra-axial fluid collection. Vascular: No hyperdense vessel or unexpected calcification. Skull: Normal. Negative for fracture or focal lesion. Sinuses/Orbits: No acute finding. Other: Right posterior parietal scalp laceration. No large hematoma. CT CERVICAL SPINE FINDINGS Alignment: No acute subluxation. Skull base and vertebrae: No acute fracture. Osteopenia. Soft tissues and spinal canal: No prevertebral fluid or swelling. No visible canal hematoma. Disc levels: Multilevel degenerative changes with disc space narrowing and endplate irregularity. Upper chest: Partially visualized bilateral pleural effusions. There is diffuse interstitial and interlobular thickening, likely edema.  Clinical correlation is recommended. Other: Bilateral carotid bulb calcified plaques. IMPRESSION: 1. No acute intracranial pathology. Age-related atrophy and chronic microvascular ischemic changes. 2. No acute/traumatic cervical spine pathology. Multilevel degenerative changes. 3. Partially visualized bilateral pleural effusions and findings of pulmonary edema. Clinical correlation is recommended. Electronically Signed   By: Anner Crete M.D.   On: 12/31/2020 17:14   CT Cervical Spine Wo Contrast  Result Date: 12/31/2020 CLINICAL DATA:  87 year old male with head trauma. EXAM: CT HEAD WITHOUT CONTRAST CT CERVICAL SPINE WITHOUT CONTRAST TECHNIQUE: Multidetector CT imaging of the head and cervical spine was performed following the standard  protocol without intravenous contrast. Multiplanar CT image reconstructions of the cervical spine were also generated. COMPARISON:  None. FINDINGS: CT HEAD FINDINGS Brain: Mild age-related atrophy and chronic microvascular ischemic changes. There is no acute intracranial hemorrhage. No mass effect or midline shift. No extra-axial fluid collection. Vascular: No hyperdense vessel or unexpected calcification. Skull: Normal. Negative for fracture or focal lesion. Sinuses/Orbits: No acute finding. Other: Right posterior parietal scalp laceration. No large hematoma. CT CERVICAL SPINE FINDINGS Alignment: No acute subluxation. Skull base and vertebrae: No acute fracture. Osteopenia. Soft tissues and spinal canal: No prevertebral fluid or swelling. No visible canal hematoma. Disc levels: Multilevel degenerative changes with disc space narrowing and endplate irregularity. Upper chest: Partially visualized bilateral pleural effusions. There is diffuse interstitial and interlobular thickening, likely edema. Clinical correlation is recommended. Other: Bilateral carotid bulb calcified plaques. IMPRESSION: 1. No acute intracranial pathology. Age-related atrophy and chronic microvascular ischemic changes. 2. No acute/traumatic cervical spine pathology. Multilevel degenerative changes. 3. Partially visualized bilateral pleural effusions and findings of pulmonary edema. Clinical correlation is recommended. Electronically Signed   By: Anner Crete M.D.   On: 12/31/2020 17:14   DG Pelvis Portable  Result Date: 12/31/2020 CLINICAL DATA:  Fall. EXAM: PORTABLE PELVIS 1-2 VIEWS COMPARISON:  None. FINDINGS: There is no evidence of pelvic fracture or diastasis. No pelvic bone lesions are seen. IMPRESSION: Negative. Electronically Signed   By: Marijo Conception M.D.   On: 12/31/2020 16:48   DG Chest Port 1 View  Result Date: 12/31/2020 CLINICAL DATA:  87 year old male with fall. EXAM: PORTABLE CHEST 1 VIEW COMPARISON:  Chest  radiograph dated 04/17/2020. FINDINGS: Shallow inspiration. Bilateral interstitial coarsening and hazy airspace densities primarily involving the mid to lower lung field and subpleural region of the lungs, progressed since the prior radiograph and may represent progression of underlying fibrosis or sequela of prior inflammatory/infectious process including prior COVID-19 infection. Clinical correlation is recommended. There is no pleural effusion pneumothorax. Stable cardiac silhouette. Median sternotomy wires and CABG vascular clips. No acute osseous pathology. IMPRESSION: Progression of bilateral pulmonary densities, likely worsening fibrosis or sequela of prior inflammatory/infectious process. Electronically Signed   By: Anner Crete M.D.   On: 12/31/2020 16:48      Assessment/Plan  87 year old male with a.fib on coumadin, chronic diastolic HF coming from assisted living status post syncope and fall resulting in posterior scalp laceration.  He was also incidentally found to be hypoxic down to 86% on room air with significant bilateral lower extremity edema and pulmonary edema on chest x-ray.  Syncope with loss of consciousness -possibly due to prolonged hypoxia - will keep on telemetry to monitor for any arrhythmias -Obtain echo  Acute on chronic diastolic HF exacerbation -Because of exacerbation due to Lasix regimen recently being decreased due to worsening renal function -Continue IV 40 mg Lasix BID per cardiology recommendation -Strict intake and output -Daily-weights -Obtain  echocardiogram  Acute hypoxic respiratory failure secondary to CHF exac - treatment as above for CHF -maintain O2 >92%  AKI on CKD stage IV  -Cr baseline around 2.3. Admit Cr of 2.55 - suspect more due to cardiorenal syndrome - monitor closely while on IV diuretics   Posterior scalp laceration from fall -has staples and will need to remove in about 7 days   Atrial fibrillation -warfarin per  pharmacy  HLD -Continue statin   Hypothyroidism  -continue levothyroxine   DVT prophylaxis:.warfarin  Code Status: DNR Family Communication: Plan discussed with patient and son-in-law at bedside  disposition Plan: Home with at least 2 midnight stays  Consults called:  Admission status: inpatient  Level of care: Telemetry Cardiac  Status is: Inpatient  Remains inpatient appropriate because:Inpatient level of care appropriate due to severity of illness   Dispo: The patient is from: ALF              Anticipated d/c is to: ALF              Patient currently is not medically stable to d/c.   Difficult to place patient No         Orene Desanctis DO [facility]   If 7PM-7AM, please contact night-coverage [email or web address]   12/31/2020, 10:55 PM

## 2020-12-31 NOTE — ED Triage Notes (Addendum)
Patient arrives to ED coming  from [facility] due to a Fall. Per EMS pt was attempting to charge his iPad and lost his balance and fell hitting his head on a bedside table resulting with a Laceration to the posterior of head. Per EMS no LOC, head is wrapped, bleeding is controlled, patient arrived wearing C-Collar and is A/O x4. Per EMS patient is on a Blood Thinner (Warfarin) Hx of Afib. EDP and Trauma team at bedside upon patient's arrival.  BP 138/88

## 2020-12-31 NOTE — ED Notes (Signed)
Pt back from CT and is on Cardiac Monitor.

## 2021-01-01 ENCOUNTER — Inpatient Hospital Stay (HOSPITAL_COMMUNITY): Payer: Medicare Other

## 2021-01-01 DIAGNOSIS — I5033 Acute on chronic diastolic (congestive) heart failure: Secondary | ICD-10-CM | POA: Diagnosis not present

## 2021-01-01 DIAGNOSIS — I509 Heart failure, unspecified: Secondary | ICD-10-CM | POA: Diagnosis not present

## 2021-01-01 DIAGNOSIS — R55 Syncope and collapse: Secondary | ICD-10-CM | POA: Diagnosis not present

## 2021-01-01 DIAGNOSIS — I4819 Other persistent atrial fibrillation: Secondary | ICD-10-CM | POA: Diagnosis not present

## 2021-01-01 DIAGNOSIS — E785 Hyperlipidemia, unspecified: Secondary | ICD-10-CM | POA: Diagnosis not present

## 2021-01-01 DIAGNOSIS — J9601 Acute respiratory failure with hypoxia: Secondary | ICD-10-CM | POA: Diagnosis not present

## 2021-01-01 DIAGNOSIS — N179 Acute kidney failure, unspecified: Secondary | ICD-10-CM | POA: Diagnosis not present

## 2021-01-01 DIAGNOSIS — I4821 Permanent atrial fibrillation: Secondary | ICD-10-CM

## 2021-01-01 LAB — CBC
HCT: 33.7 % — ABNORMAL LOW (ref 39.0–52.0)
Hemoglobin: 10.8 g/dL — ABNORMAL LOW (ref 13.0–17.0)
MCH: 30.1 pg (ref 26.0–34.0)
MCHC: 32 g/dL (ref 30.0–36.0)
MCV: 93.9 fL (ref 80.0–100.0)
Platelets: 115 10*3/uL — ABNORMAL LOW (ref 150–400)
RBC: 3.59 MIL/uL — ABNORMAL LOW (ref 4.22–5.81)
RDW: 17.2 % — ABNORMAL HIGH (ref 11.5–15.5)
WBC: 7.7 10*3/uL (ref 4.0–10.5)
nRBC: 0 % (ref 0.0–0.2)

## 2021-01-01 LAB — BASIC METABOLIC PANEL
Anion gap: 8 (ref 5–15)
BUN: 47 mg/dL — ABNORMAL HIGH (ref 8–23)
CO2: 27 mmol/L (ref 22–32)
Calcium: 8.6 mg/dL — ABNORMAL LOW (ref 8.9–10.3)
Chloride: 101 mmol/L (ref 98–111)
Creatinine, Ser: 2.26 mg/dL — ABNORMAL HIGH (ref 0.61–1.24)
GFR, Estimated: 26 mL/min — ABNORMAL LOW (ref >60)
Glucose, Bld: 112 mg/dL — ABNORMAL HIGH (ref 70–99)
Potassium: 4.3 mmol/L (ref 3.5–5.1)
Sodium: 136 mmol/L (ref 135–145)

## 2021-01-01 LAB — PROTIME-INR
INR: 2.2 — ABNORMAL HIGH (ref 0.8–1.2)
Prothrombin Time: 24.4 seconds — ABNORMAL HIGH (ref 11.4–15.2)

## 2021-01-01 LAB — ECHOCARDIOGRAM COMPLETE
AR max vel: 3.13 cm2
AV Area VTI: 3.35 cm2
AV Area mean vel: 3.27 cm2
AV Mean grad: 4 mmHg
AV Peak grad: 8.2 mmHg
Ao pk vel: 1.44 m/s
Area-P 1/2: 4.06 cm2
Height: 60 in
P 1/2 time: 492 msec
S' Lateral: 3.2 cm
Weight: 2395.08 oz

## 2021-01-01 MED ORDER — WARFARIN SODIUM 1 MG PO TABS
1.0000 mg | ORAL_TABLET | Freq: Once | ORAL | Status: AC
  Administered 2021-01-01: 1 mg via ORAL
  Filled 2021-01-01: qty 1

## 2021-01-01 MED ORDER — ACETAMINOPHEN 325 MG PO TABS
650.0000 mg | ORAL_TABLET | Freq: Once | ORAL | Status: AC
  Administered 2021-01-01: 650 mg via ORAL
  Filled 2021-01-01: qty 2

## 2021-01-01 NOTE — Plan of Care (Signed)
°  Problem: Clinical Measurements: °Goal: Ability to maintain clinical measurements within normal limits will improve °Outcome: Progressing °  °Problem: Activity: °Goal: Risk for activity intolerance will decrease °Outcome: Progressing °  °Problem: Nutrition: °Goal: Adequate nutrition will be maintained °Outcome: Progressing °  °

## 2021-01-01 NOTE — Evaluation (Signed)
Physical Therapy Evaluation Patient Details Name: [name] MRN: [id number] DOB: 1920/05/09 Today's Date: 01/01/2021   History of Present Illness  Pt is a 87 y.o. male admitted from ALF on 12/31/20 with syncopal episode (+ LOC), fall and hitting head; pt reports similar episode 2 months prior, as well as worsening DOE and BLE edema. Workup revealed R posterior head lac post-staples, hypoxia, pulmonary edema, AKI. Head CT negative for acute injury. PMH includes afib on coumadin, HF, CKD IV.    Clinical Impression  Pt presents with an overall decrease in functional mobility secondary to above. PTA, pt resides at [facility] with his wife across the hall; pt reports ambulatory with rollator for shorter distances, electric scooter for longer distances, staff assists with various ADLs. Today, pt able to transfer and ambulate short distance with RW and min guard. Pt would benefit from continued acute PT services to maximize functional mobility and independence prior to return to ALF with HHPT services.   Post-ambulation BP 115/68 (see OT Evaluation note for negative orthostatic BP values) SpO2 down to 86% on RA with ambulation SpO2 >/92% on 2L O2 [place]    Follow Up Recommendations Home health PT;Supervision - Intermittent    Equipment Recommendations  None recommended by PT    Recommendations for Other Services       Precautions / Restrictions Precautions Precautions: Fall;Other (comment) Precaution Comments: Watch SpO2 Restrictions Weight Bearing Restrictions: No      Mobility  Bed Mobility Overal bed mobility: Needs Assistance Bed Mobility: Sit to Supine       Sit to supine: Supervision        Transfers Overall transfer level: Needs assistance Equipment used: Rolling walker (2 wheeled) Transfers: Sit to/from Stand Sit to Stand: Min guard         General transfer comment: Min guard for balance, good hand placement without  cues  Ambulation/Gait Ambulation/Gait assistance: Min guard Gait Distance (Feet): 36 Feet Assistive device: Rolling walker (2 wheeled) Gait Pattern/deviations: Step-through pattern;Decreased stride length;Trunk flexed Gait velocity: Decreased   General Gait Details: Slow, mostly steady gait with RW and intermittent min guard for balance; standing break to perform ADL tasks as sink; DOE 2-3/4 and c/o fatigue  Stairs            Wheelchair Mobility    Modified Rankin (Stroke Patients Only)       Balance Overall balance assessment: Needs assistance Sitting-balance support: Feet supported;No upper extremity supported Sitting balance-Leahy Scale: Fair     Standing balance support: Bilateral upper extremity supported;During functional activity;No upper extremity supported Standing balance-Leahy Scale: Fair Standing balance comment: Can static stand without UE support at sink to wash hands; improved stability with UE or trunk support on sink                             Pertinent Vitals/Pain Pain Assessment: No/denies pain    Home Living Family/patient expects to be discharged to:: Assisted living               Home Equipment: Walker - 4 wheels;Grab bars - toilet;Shower seat - built in;Hand held shower head;Grab bars - tub/shower;Electric scooter Additional Comments: Resides at [place], wife lives across the hall    Prior Function Level of Independence: Needs assistance   Gait / Transfers Assistance Needed: uses rollator for short distances, scooter for longer distances  ADL's / Homemaking Assistance Needed: reports independent with ADLs, sometimes  wife assist with LB dressing; 2 showers a week with assist from staff; toilets independently; assist with meals and meds from staff  Comments: recently was in PT but ended appox 1 week ago     Hand Dominance   Dominant Hand: Right    Extremity/Trunk Assessment   Upper Extremity Assessment Upper  Extremity Assessment: Generalized weakness    Lower Extremity Assessment Lower Extremity Assessment: Generalized weakness    Cervical / Trunk Assessment Cervical / Trunk Assessment: Kyphotic  Communication   Communication: HOH  Cognition Arousal/Alertness: Awake/alert Behavior During Therapy: WFL for tasks assessed/performed Overall Cognitive Status: Within Functional Limits for tasks assessed                                 General Comments: WFL for simple tasks; requires some repetition due to [place] comments (skin integrity, edema, etc.): OT reports negative orthostatic hypotension. Post-ambulation BP 115/68; SpO2 down to 86% on RA with ambulation, up to >/92% on 2L O2 [place]    Exercises     Assessment/Plan    PT Assessment Patient needs continued PT services  PT Problem List Decreased strength;Decreased activity tolerance;Decreased balance;Decreased mobility;Cardiopulmonary status limiting activity       PT Treatment Interventions DME instruction;Gait training;Functional mobility training;Therapeutic activities;Therapeutic exercise;Balance training;Patient/family education;Wheelchair mobility training    PT Goals (Current goals can be found in the Care Plan section)  Acute Rehab PT Goals Patient Stated Goal: Return home PT Goal Formulation: With patient Time For Goal Achievement: 01/15/21 Potential to Achieve Goals: Good    Frequency Min 3X/week   Barriers to discharge        Co-evaluation               AM-PAC PT "6 Clicks" Mobility  Outcome Measure Help needed turning from your back to your side while in a flat bed without using bedrails?: None Help needed moving from lying on your back to sitting on the side of a flat bed without using bedrails?: A Little Help needed moving to and from a bed to a chair (including a wheelchair)?: A Little Help needed standing up from a chair using your arms (e.g., wheelchair or  bedside chair)?: A Little Help needed to walk in hospital room?: A Little Help needed climbing 3-5 steps with a railing? : A Lot 6 Click Score: 18    End of Session Equipment Utilized During Treatment: Gait belt;Oxygen Activity Tolerance: Patient tolerated treatment well Patient left: in bed;with call bell/phone within reach;with bed alarm set;Other (comment) (return to bed for echo) Nurse Communication: Mobility status PT Visit Diagnosis: Other abnormalities of gait and mobility (R26.89);Muscle weakness (generalized) (M62.81)    Time: 8325-4982 PT Time Calculation (min) (ACUTE ONLY): 18 min   Charges:   PT Evaluation $PT Eval Moderate Complexity: [place], PT, DPT Acute Rehabilitation Services  Pager [phone] Office [place] 01/01/2021, 1:06 PM

## 2021-01-01 NOTE — TOC CAGE-AID Note (Signed)
Transition of Care [facility]) - CAGE-AID Screening   Patient Details  Name: [name] MRN: [id number] Date of Birth: [birth year]  Clinical Narrative:  Patient denies alcohol or drug use.  CAGE-AID Screening:    Have You Ever Felt You Ought to Cut Down on Your Drinking or Drug Use?: No Have People Annoyed You By Critizing Your Drinking Or Drug Use?: No Have You Felt Bad Or Guilty About Your Drinking Or Drug Use?: No Have You Ever Had a Drink or Used Drugs First Thing In The Morning to Steady Your Nerves or to Get Rid of a Hangover?: No CAGE-AID Score: 0  Substance Abuse Education Offered: No

## 2021-01-01 NOTE — Progress Notes (Signed)
DAILY PROGRESS NOTE   Patient Name: [name] Date of Encounter: 01/01/2021 Cardiologist: None  Chief Complaint   Breathing better  Patient Profile   [name] is a 87 y.o. male with a hx of CAD s/p CABG in 2005 with redo CABG in 2008 at time of left atrial myxoma resection, permanent afib on warfarin, HTN, CKD, ILD and chronic diastolic heart failure who is being seen 12/31/2020 for the evaluation of acute on chronic diastolic heart failure at the request of Dr. Darl Householder.  Subjective   Seen for diastolic CHF - diuresed about 1.2L overnight - on lasix 40 mg IV BID. BNP elevated at 749. Troponin flat. Creatinine improved with diuresis today (2.26 from 2.55). Echo pending.  Objective   Vitals:   01/01/21 0015 01/01/21 0300 01/01/21 0730 01/01/21 0755  BP:  110/65 121/74 117/70  Pulse:  92 85 99  Resp:  15 (!) 23 16  Temp:  97.8 F (36.6 C) 99 F (37.2 C) 98.3 F (36.8 C)  TempSrc:  Oral Oral Oral  SpO2:  96% 96% 96%  Weight: 67.9 kg     Height:        Intake/Output Summary (Last 24 hours) at 01/01/2021 1200 Last data filed at 01/01/2021 1155 Gross per 24 hour  Intake 360 ml  Output 1600 ml  Net -1240 ml   Filed Weights   12/31/20 1648 01/01/21 0015  Weight: 68 kg 67.9 kg    Physical Exam   General appearance: alert, appears stated age and no distress Neck: JVD - several cm above sternal notch, no carotid bruit and thyroid not enlarged, symmetric, no tenderness/mass/nodules Lungs: diminished breath sounds bibasilar Heart: regular rate and rhythm, S1, S2 normal, systolic murmur: early systolic 2/6, blowing at lower left sternal border and diastolic murmur: mid diastolic 2/6, blowing at lower left sternal border Abdomen: soft, non-tender; bowel sounds normal; no masses,  no organomegaly Extremities: edema 2+ bilateral LE edema Pulses: 2+ and symmetric Skin: Skin color, texture, turgor normal. No rashes or lesions Neurologic: Grossly normal Psych:  Pleasant  Inpatient Medications    Scheduled Meds: . cholecalciferol  1,000 Units Oral Daily  . furosemide  40 mg Intravenous BID  . levothyroxine  88 mcg Oral QAC breakfast  . loratadine  10 mg Oral Daily  . metoprolol succinate  25 mg Oral Daily  . pantoprazole  40 mg Oral Daily  . sertraline  25 mg Oral BID  . simvastatin  10 mg Oral Daily  . Warfarin - Pharmacist Dosing Inpatient   Does not apply q1600    Continuous Infusions:   PRN Meds: diazepam   Labs   Results for orders placed or performed during the hospital encounter of 12/31/20 (from the past 48 hour(s))  CBC with Differential/Platelet     Status: Abnormal   Collection Time: 12/31/20  4:30 PM  Result Value Ref Range   WBC 7.8 4.0 - 10.5 K/uL   RBC 3.86 (L) 4.22 - 5.81 MIL/uL   Hemoglobin 11.8 (L) 13.0 - 17.0 g/dL   HCT 37.1 (L) 39.0 - 52.0 %   MCV 96.1 80.0 - 100.0 fL   MCH 30.6 26.0 - 34.0 pg   MCHC 31.8 30.0 - 36.0 g/dL   RDW 17.4 (H) 11.5 - 15.5 %   Platelets 125 (L) 150 - 400 K/uL   nRBC 0.0 0.0 - 0.2 %   Neutrophils Relative % 72 %   Neutro Abs 5.6 1.7 - 7.7 K/uL  Lymphocytes Relative 16 %   Lymphs Abs 1.3 0.7 - 4.0 K/uL   Monocytes Relative 8 %   Monocytes Absolute 0.6 0.1 - 1.0 K/uL   Eosinophils Relative 2 %   Eosinophils Absolute 0.2 0.0 - 0.5 K/uL   Basophils Relative 1 %   Basophils Absolute 0.1 0.0 - 0.1 K/uL   Immature Granulocytes 1 %   Abs Immature Granulocytes 0.10 (H) 0.00 - 0.07 K/uL    Comment: Performed at [facility] [address], [place], [place] [address]  Comprehensive metabolic panel     Status: Abnormal   Collection Time: 12/31/20  4:30 PM  Result Value Ref Range   Sodium 135 135 - 145 mmol/L   Potassium 4.5 3.5 - 5.1 mmol/L   Chloride 97 (L) 98 - 111 mmol/L   CO2 27 22 - 32 mmol/L   Glucose, Bld 146 (H) 70 - 99 mg/dL    Comment: Glucose reference range applies only to samples taken after fasting for at least 8 hours.   BUN 48 (H) 8 - 23 mg/dL   Creatinine,  Ser 2.55 (H) 0.61 - 1.24 mg/dL   Calcium 8.9 8.9 - 10.3 mg/dL   Total Protein 6.6 6.5 - 8.1 g/dL   Albumin 3.6 3.5 - 5.0 g/dL   AST 29 15 - 41 U/L   ALT 23 0 - 44 U/L   Alkaline Phosphatase 82 38 - 126 U/L   Total Bilirubin 1.0 0.3 - 1.2 mg/dL   GFR, Estimated 22 (L) >60 mL/min    Comment: (NOTE) Calculated using the CKD-EPI Creatinine Equation (2021)    Anion gap 11 5 - 15    Comment: Performed at [facility] [address] [place], [place] [address]  Protime-INR     Status: Abnormal   Collection Time: 12/31/20  4:30 PM  Result Value Ref Range   Prothrombin Time 24.2 (H) 11.4 - 15.2 seconds   INR 2.2 (H) 0.8 - 1.2    Comment: (NOTE) INR goal varies based on device and disease states. Performed at [facility], [place] [address], [place], [place] [address]   Troponin I (High Sensitivity)     Status: Abnormal   Collection Time: 12/31/20  4:46 PM  Result Value Ref Range   Troponin I (High Sensitivity) 104 (HH) <18 ng/L    Comment: CRITICAL RESULT CALLED TO, READ BACK BY AND VERIFIED WITH: A.COSTALES,RN @1805  12/31/2020 VANG.J (NOTE) Elevated high sensitivity troponin I (hsTnI) values and significant  changes across serial measurements may suggest ACS but many other  chronic and acute conditions are known to elevate hsTnI results.  Refer to the Links section for chest pain algorithms and additional  guidance. Performed at [facility], [place] [address], [place], [place] [address]   Brain natriuretic peptide     Status: Abnormal   Collection Time: 12/31/20  4:46 PM  Result Value Ref Range   B Natriuretic Peptide 749.5 (H) 0.0 - 100.0 pg/mL    Comment: Performed at [facility] [address], [place], [place] [address]  Resp Panel by RT-PCR (Flu A&B, Covid) Nasopharyngeal Swab     Status: None   Collection Time: 12/31/20  4:46 PM   Specimen: Nasopharyngeal Swab; Nasopharyngeal(NP) swabs in vial transport medium  Result Value Ref Range   SARS  Coronavirus 2 by RT PCR NEGATIVE NEGATIVE    Comment: (NOTE) SARS-CoV-2 target nucleic acids are NOT DETECTED.  The SARS-CoV-2 RNA is  generally detectable in upper respiratory specimens during the acute phase of infection. The lowest concentration of SARS-CoV-2 viral copies this assay can detect is 138 copies/mL. A negative result does not preclude SARS-Cov-2 infection and should not be used as the sole basis for treatment or other patient management decisions. A negative result may occur with  improper specimen collection/handling, submission of specimen other than nasopharyngeal swab, presence of viral mutation(s) within the areas targeted by this assay, and inadequate number of viral copies(<138 copies/mL). A negative result must be combined with clinical observations, patient history, and epidemiological information. The expected result is Negative.  Fact Sheet for Patients:  EntrepreneurPulse.com.au  Fact Sheet for Healthcare Providers:  IncredibleEmployment.be  This test is no t yet approved or cleared by the [place] FDA and  has been authorized for detection and/or diagnosis of SARS-CoV-2 by FDA under an Emergency Use Authorization (EUA). This EUA will remain  in effect (meaning this test can be used) for the duration of the COVID-19 declaration under Section 564(b)(1) of the Act, 21 U.S.C.section 360bbb-3(b)(1), unless the authorization is terminated  or revoked sooner.       Influenza A by PCR NEGATIVE NEGATIVE   Influenza B by PCR NEGATIVE NEGATIVE    Comment: (NOTE) The Xpert Xpress SARS-CoV-2/FLU/RSV plus assay is intended as an aid in the diagnosis of influenza from Nasopharyngeal swab specimens and should not be used as a sole basis for treatment. Nasal washings and aspirates are unacceptable for Xpert Xpress SARS-CoV-2/FLU/RSV testing.  Fact Sheet for Patients: EntrepreneurPulse.com.au  Fact Sheet  for Healthcare Providers: IncredibleEmployment.be  This test is not yet approved or cleared by the [place] FDA and has been authorized for detection and/or diagnosis of SARS-CoV-2 by FDA under an Emergency Use Authorization (EUA). This EUA will remain in effect (meaning this test can be used) for the duration of the COVID-19 declaration under Section 564(b)(1) of the Act, 21 U.S.C. section 360bbb-3(b)(1), unless the authorization is terminated or revoked.  Performed at [facility], [place] [address], [place], [place] [address]   Troponin I (High Sensitivity)     Status: Abnormal   Collection Time: 12/31/20  6:46 PM  Result Value Ref Range   Troponin I (High Sensitivity) 107 (HH) <18 ng/L    Comment: CRITICAL VALUE NOTED.  VALUE IS CONSISTENT WITH PREVIOUSLY REPORTED AND CALLED VALUE. (NOTE) Elevated high sensitivity troponin I (hsTnI) values and significant  changes across serial measurements may suggest ACS but many other  chronic and acute conditions are known to elevate hsTnI results.  Refer to the Links section for chest pain algorithms and additional  guidance. Performed at [facility], [place] [address] [place], [place] [address]   Basic metabolic panel     Status: Abnormal   Collection Time: 01/01/21  2:13 AM  Result Value Ref Range   Sodium 136 135 - 145 mmol/L   Potassium 4.3 3.5 - 5.1 mmol/L   Chloride 101 98 - 111 mmol/L   CO2 27 22 - 32 mmol/L   Glucose, Bld 112 (H) 70 - 99 mg/dL    Comment: Glucose reference range applies only to samples taken after fasting for at least 8 hours.   BUN 47 (H) 8 - 23 mg/dL   Creatinine, Ser 2.26 (H) 0.61 - 1.24 mg/dL   Calcium 8.6 (L) 8.9 - 10.3 mg/dL   GFR, Estimated 26 (L) >60 mL/min    Comment: (NOTE) Calculated using the CKD-EPI Creatinine Equation (2021)  Anion gap 8 5 - 15    Comment: Performed at [facility] [address] [place], [place] [address]  CBC     Status: Abnormal    Collection Time: 01/01/21  2:13 AM  Result Value Ref Range   WBC 7.7 4.0 - 10.5 K/uL   RBC 3.59 (L) 4.22 - 5.81 MIL/uL   Hemoglobin 10.8 (L) 13.0 - 17.0 g/dL   HCT 33.7 (L) 39.0 - 52.0 %   MCV 93.9 80.0 - 100.0 fL   MCH 30.1 26.0 - 34.0 pg   MCHC 32.0 30.0 - 36.0 g/dL   RDW 17.2 (H) 11.5 - 15.5 %   Platelets 115 (L) 150 - 400 K/uL    Comment: Immature Platelet Fraction may be clinically indicated, consider ordering this additional test [id number] CONSISTENT WITH PREVIOUS RESULT REPEATED TO VERIFY    nRBC 0.0 0.0 - 0.2 %    Comment: Performed at [facility], [place] [address], [place], [place] [address]  Protime-INR     Status: Abnormal   Collection Time: 01/01/21  2:13 AM  Result Value Ref Range   Prothrombin Time 24.4 (H) 11.4 - 15.2 seconds   INR 2.2 (H) 0.8 - 1.2    Comment: (NOTE) INR goal varies based on device and disease states. Performed at [facility], [place] [address], [place], [place] [address]     ECG   N/A  Telemetry   Afib - Personally Reviewed  Radiology    CT Head Wo Contrast  Result Date: 12/31/2020 CLINICAL DATA:  87 year old male with head trauma. EXAM: CT HEAD WITHOUT CONTRAST CT CERVICAL SPINE WITHOUT CONTRAST TECHNIQUE: Multidetector CT imaging of the head and cervical spine was performed following the standard protocol without intravenous contrast. Multiplanar CT image reconstructions of the cervical spine were also generated. COMPARISON:  None. FINDINGS: CT HEAD FINDINGS Brain: Mild age-related atrophy and chronic microvascular ischemic changes. There is no acute intracranial hemorrhage. No mass effect or midline shift. No extra-axial fluid collection. Vascular: No hyperdense vessel or unexpected calcification. Skull: Normal. Negative for fracture or focal lesion. Sinuses/Orbits: No acute finding. Other: Right posterior parietal scalp laceration. No large hematoma. CT CERVICAL SPINE FINDINGS Alignment: No acute subluxation. Skull base and  vertebrae: No acute fracture. Osteopenia. Soft tissues and spinal canal: No prevertebral fluid or swelling. No visible canal hematoma. Disc levels: Multilevel degenerative changes with disc space narrowing and endplate irregularity. Upper chest: Partially visualized bilateral pleural effusions. There is diffuse interstitial and interlobular thickening, likely edema. Clinical correlation is recommended. Other: Bilateral carotid bulb calcified plaques. IMPRESSION: 1. No acute intracranial pathology. Age-related atrophy and chronic microvascular ischemic changes. 2. No acute/traumatic cervical spine pathology. Multilevel degenerative changes. 3. Partially visualized bilateral pleural effusions and findings of pulmonary edema. Clinical correlation is recommended. Electronically Signed   By: Anner Crete M.D.   On: 12/31/2020 17:14   CT Cervical Spine Wo Contrast  Result Date: 12/31/2020 CLINICAL DATA:  87 year old male with head trauma. EXAM: CT HEAD WITHOUT CONTRAST CT CERVICAL SPINE WITHOUT CONTRAST TECHNIQUE: Multidetector CT imaging of the head and cervical spine was performed following the standard protocol without intravenous contrast. Multiplanar CT image reconstructions of the cervical spine were also generated. COMPARISON:  None. FINDINGS: CT HEAD FINDINGS Brain: Mild age-related atrophy and chronic microvascular ischemic changes. There is no acute intracranial hemorrhage. No mass effect or midline shift. No extra-axial fluid collection. Vascular: No hyperdense vessel or unexpected calcification. Skull: Normal. Negative for fracture or focal  lesion. Sinuses/Orbits: No acute finding. Other: Right posterior parietal scalp laceration. No large hematoma. CT CERVICAL SPINE FINDINGS Alignment: No acute subluxation. Skull base and vertebrae: No acute fracture. Osteopenia. Soft tissues and spinal canal: No prevertebral fluid or swelling. No visible canal hematoma. Disc levels: Multilevel degenerative changes  with disc space narrowing and endplate irregularity. Upper chest: Partially visualized bilateral pleural effusions. There is diffuse interstitial and interlobular thickening, likely edema. Clinical correlation is recommended. Other: Bilateral carotid bulb calcified plaques. IMPRESSION: 1. No acute intracranial pathology. Age-related atrophy and chronic microvascular ischemic changes. 2. No acute/traumatic cervical spine pathology. Multilevel degenerative changes. 3. Partially visualized bilateral pleural effusions and findings of pulmonary edema. Clinical correlation is recommended. Electronically Signed   By: Anner Crete M.D.   On: 12/31/2020 17:14   DG Pelvis Portable  Result Date: 12/31/2020 CLINICAL DATA:  Fall. EXAM: PORTABLE PELVIS 1-2 VIEWS COMPARISON:  None. FINDINGS: There is no evidence of pelvic fracture or diastasis. No pelvic bone lesions are seen. IMPRESSION: Negative. Electronically Signed   By: Marijo Conception M.D.   On: 12/31/2020 16:48   DG Chest Port 1 View  Result Date: 12/31/2020 CLINICAL DATA:  87 year old male with fall. EXAM: PORTABLE CHEST 1 VIEW COMPARISON:  Chest radiograph dated 04/17/2020. FINDINGS: Shallow inspiration. Bilateral interstitial coarsening and hazy airspace densities primarily involving the mid to lower lung field and subpleural region of the lungs, progressed since the prior radiograph and may represent progression of underlying fibrosis or sequela of prior inflammatory/infectious process including prior COVID-19 infection. Clinical correlation is recommended. There is no pleural effusion pneumothorax. Stable cardiac silhouette. Median sternotomy wires and CABG vascular clips. No acute osseous pathology. IMPRESSION: Progression of bilateral pulmonary densities, likely worsening fibrosis or sequela of prior inflammatory/infectious process. Electronically Signed   By: Anner Crete M.D.   On: 12/31/2020 16:48    Cardiac Studies   N/A  Assessment    1. Principal Problem: 2.   Acute CHF (congestive heart failure) ([facility]) 3. Active Problems: 4.   Syncope and collapse 5.   Acute respiratory failure with hypoxia ([facility]) 6.   Acute-on-chronic kidney injury ([facility]) 7.   Scalp laceration 8.   Unspecified atrial fibrillation ([facility]) 9.   HLD (hyperlipidemia) 10.   Hypothyroidism 11.   Plan   #Acute on chronic diastolic heart failure exacerbation: Likely triggered by recent decrease in lasix dosing. Currently with NYHA class II-III symptoms with pulmonary edema on CXR, BNP 700 and significant LE edema (chronic). No chest pain or anginal symptoms with low suspicion of active ischemia. In Afib chronically and rate controlled and therefore unlikely to be trigger of acute decompensation. -Continue lasix 40mg  IV BID; monitor response and adjust as needed - TEE pending, he remains volume overloaded, but creatinine improving with diuresis. -Continue metop succinate 25mg  XL daily -Monitor I/Os and daily weights -Low Na diet  #Possible Syncope vs Mechanical Fall with Head Strike: Patient fell when trying to plug in his iPad. Thought he may have "blacked out" that led to his fall. No preceding chest pain or palpitations. CT head without acute pathology. ECG with afib without ischemia.  -Follow-up TTE -Monitor on telemetry -Management of HF as above -S/p lac repair; management per primary team  #CAD s/p CABG in 2005 and 2008: No anginal symptoms. Trop flat.  -No ASA given need for warfarin -Not on statin and given age, does not need to be initiated at this time  #Permanent Afib: CHADs-vasc 5. On warfarin for AC.  -Continue warfarin;  no bleed on CT head -Continue metop as above  #CKD stage IV: -Management per primary -Diuresis as above  #Ascending aorta dilatation: Ascending aorta 45 mm on echocardiogram in 2016. Given his advanced age and comorbid illnesses, he would not be a candidate for surgical repair.  No further testing is  warranted at this time. Will re-assess aortic size on echo.  Time Spent Directly with Patient:  I have spent a total of 25 minutes with the patient reviewing hospital notes, telemetry, EKGs, labs and examining the patient as well as establishing an assessment and plan that was discussed personally with the patient.  > 50% of time was spent in direct patient care.  Length of Stay:  LOS: 1 day   Pixie Casino, MD, [facility] [place] Director of the Advanced Lipid Disorders &  Cardiovascular Risk Reduction Clinic Diplomate of the American Board of Clinical Lipidology Attending Cardiologist  Direct Dial: [phone]  Fax: [phone]  Website:  [email or web address] 01/01/2021, 12:00 PM

## 2021-01-01 NOTE — Progress Notes (Signed)
Heart Failure Navigator Progress Note  Assessed for Heart & Vascular TOC clinic readiness.  Unfortunately at this time the patient does not meet criteria due to AKI on CKD IV.   Navigator available for reassessment of patient.   Kerby Nora, PharmD, BCPS Heart Failure Stewardship Pharmacist Phone [phone]

## 2021-01-01 NOTE — Progress Notes (Signed)
  Echocardiogram 2D Echocardiogram has been performed.  [name] 01/01/2021, 11:02 AM

## 2021-01-01 NOTE — Progress Notes (Signed)
ANTICOAGULATION CONSULT NOTE - Follow Up Consult  Pharmacy Consult for Warfarin Indication: atrial fibrillation  Allergies  Allergen Reactions  . Penicillins     Unknown    Patient Measurements: Height: 5' (152.4 cm) Weight: 67.9 kg (149 lb 11.1 oz) IBW/kg (Calculated) : 50  Vital Signs: Temp: 98.5 F (36.9 C) (05/25 1310) Temp Source: Oral (05/25 1310) BP: 130/56 (05/25 1310) Pulse Rate: 72 (05/25 1310)  Labs: Recent Labs    12/31/20 1630 12/31/20 1646 12/31/20 1846 01/01/21 0213  HGB 11.8*  --   --  10.8*  HCT 37.1*  --   --  33.7*  PLT 125*  --   --  115*  LABPROT 24.2*  --   --  24.4*  INR 2.2*  --   --  2.2*  CREATININE 2.55*  --   --  2.26*  TROPONINIHS  --  104* 107*  --     Estimated Creatinine Clearance: 14.8 mL/min (A) (by C-G formula based on SCr of 2.26 mg/dL (H)).  Assessment:  87 yr old male admitted 5/24 after syncope, fall and LOC. Noted to have hit the back of his head and has scalp laceration, stapled.  No bleeding per CT head.   Continues on Warfarin as PTA for atrial fibrillation.      INR 2.2 yesterday and warfarin 1.25 mg given. INR 2.2 again today. Thrombocytopenia. No bleeding noted.    PTA warfarin regimen per facility MAR:  1 mg MWF, 1.5 mg TTSS (9 mg weekly).  Previously on 1.25 mg daily (8.75 mg weekly).  Goal of Therapy:  INR 2-3 Monitor platelets by anticoagulation protocol: Yes   Plan:  Warfarin 1 mg x 1 today. Usual Wednesday dose. Daily PT/INR for now. Monitor for s/sx bleeding.  Arty Baumgartner, RPh 01/01/2021,1:20 PM

## 2021-01-01 NOTE — Progress Notes (Signed)
PROGRESS NOTE    [name]  YPP:[id number] DOB: Apr 26, 1920 DOA: 12/31/2020 PCP: Merryl Hacker, No    Brief Narrative:  [name] is a 87 year old male with past medical history significant for paroxysmal atrial fibrillation on Coumadin, chronic diastolic congestive heart failure, CKD stage IV, anemia due to CKD, HLD, hypothyroidism, depression, anxiety who presented to the ED with concerns of syncope and fall.  Patient was apparently trying to plug in his iPad but suddenly lost consciousness and fell backwards hitting his head on a bedside table.  Patient sustained laceration to his right posterior scalp.  Patient reports increasing dyspnea on exertion for the past 2-3 months with worsening chronic lower extremity edema.  Recently had his Lasix dose decreased due to worsening creatinine level.  Similar episode roughly 2 months ago when he was sitting up in bed.  Denies dizziness or lightheadedness.  No chest pain or palpitations.  In the ED, temperature 97.7 F, HR 80, RR 20, BP 142/76, SPO2 86% on 4 L nasal cannula.  Sodium 135, potassium 4.5, chloride 97, CO2 27, glucose 146, BUN 48, creatinine 2.55.  BNP 749.5.  Troponin 104>107.  WBC 7.8, hemoglobin 11.8, platelets 125.  Cova-19 PCR negative.  Influenza A/B PCR negative.  CT head/C-spine with no acute intracranial pathology, age-related atrophy and chronic microvascular ischemic changes, no acute/traumatic cervical spine pathology, partially visualized bilateral pleural effusions and findings of pulmonary edema.  Chest x-ray with progression of bilateral pulmonary densities, likely worsening fibrosis or sequela of prior inflammatory/infectious process. Pelvis x-ray negative.  EDP consulted hospital service for further evaluation management of acute on chronic diastolic congestive heart failure.   Assessment & Plan:   Principal Problem:   Acute CHF (congestive heart failure) (HCC) Active Problems:   Syncope and collapse   Acute  respiratory failure with hypoxia (HCC)   Acute-on-chronic kidney injury ([place])   Scalp laceration   Unspecified atrial fibrillation (HCC)   HLD (hyperlipidemia)   Hypothyroidism   Acute hypoxic respiratory failure, POA Acute on chronic diastolic congestive heart failure Patient presenting to the ED with progressive shortness of breath, lower extremity edema and syncopal event with recent decrease in home diuretic regimen.  Patient was noted to have pulmonary edema on CT scanning with elevated BNP.  Patient hypoxic on presentation with SPO2 86% on 4 L nasal cannula. --Cardiology following, appreciate assistance --net negative 765mL past 24h --wt 68>67.9kg --Furosemide 40 mg IV q12h --Strict I's and O's Daily weights --Continue supplemental oxygen, maintain SPO2 greater than 92%, down to 2L [place] --BMP daily  Syncope with loss of consciousness Suspect secondary to prolonged hypoxia from diastolic CHF as above. --Continue to monitor for arrhythmias on telemetry --TTE: Pending --PT/OT evaluation  Acute renal failure on CKD stage IV Baseline creatinine 2.3.  On admission, creatinine elevated 2.55.  Etiology likely volume overload. --Cr 2.55>2.26 --Continue IV diuresis as above --Avoid nephrotoxins, renal dose all medications --BMP daily  Posterior scalp laceration Following syncopal event at home, patient sustained posterior scalp laceration.  Underwent skin approximation by EDP with staples on 12/31/2020. --Will need staple removal 7 days (5/31)  Permanent atrial fibrillation --Metoprolol succinate 25 mg p.o. 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  Not a surgical candidate given his age and comorbidities. -- Pending repeat TTE as above.  Hyperlipidemia: Simvastatin 10 mg p.o. daily  Hypothyroidism: Levothyroxine 88 mcg p.o.  daily  Depression/anxiety --Sertraline 25 mg p.o. twice daily --Diazepam 2.5 mg p.o. every 6 hours as needed anxiety  GERD: Continue PPI   DVT prophylaxis: coumadin   Code Status: DNR Family Communication: updated patient's daughter [name] via telephone this afternoon.  Disposition Plan:  Level of care: Telemetry Cardiac Status is: Inpatient  Remains inpatient appropriate because:Ongoing diagnostic testing needed not appropriate for outpatient work up, Unsafe d/c plan, IV treatments appropriate due to intensity of illness or inability to take PO and Inpatient level of care appropriate due to severity of illness   Dispo: The patient is from: [place]              Anticipated d/c is to: To be determined              Patient currently is not medically stable to d/c.   Difficult to place patient No    Consultants:   Cardiology  Procedures:   Scalp laceration repair with staples 12/31/2020  Antimicrobials:   None   Subjective: Patient seen examined at bedside, resting comfortably.  Shortness of breath improved.  Oxygen now down to 2 L nasal cannula.  No other questions or concerns at this time.  Denies headache, no fever/chills/night sweats, no dizziness, no visual changes, no chest pain, no abdominal pain, no weakness, no fatigue, no paresthesias.  No acute events overnight per nurse staff.  Objective: Vitals:   01/01/21 0015 01/01/21 0300 01/01/21 0730 01/01/21 0755  BP:  110/65 121/74 117/70  Pulse:  92 85 99  Resp:  15 (!) 23 16  Temp:  97.8 F (36.6 C) 99 F (37.2 C) 98.3 F (36.8 C)  TempSrc:  Oral Oral Oral  SpO2:  96% 96% 96%  Weight: 67.9 kg     Height:        Intake/Output Summary (Last 24 hours) at 01/01/2021 1207 Last data filed at 01/01/2021 1155 Gross per 24 hour  Intake 360 ml  Output 1600 ml  Net -1240 ml   Filed Weights   12/31/20 1648 01/01/21 0015  Weight: 68 kg 67.9 kg    Examination:  General exam: Appears calm and  comfortable, elderly in appearance Respiratory system: Breath sounds slightly decreased bilateral bases with mild crackles, no wheezing, normal [place] effort, on 2 L nasal cannula with SPO2 96% Cardiovascular system: S1 & S2 heard, irregularly irregular rhythm, normal rate. No JVD, murmurs, rubs, gallops or clicks.  2+ pitting edema bilateral lower extremities to knee Gastrointestinal system: Abdomen is nondistended, soft and nontender. No organomegaly or masses felt. Normal bowel sounds heard. Central nervous system: Alert and oriented. No focal neurological deficits. Extremities: Symmetric 5 x 5 power. Skin: No rashes, lesions or ulcers Psychiatry: Judgement and insight appear normal. Mood & affect appropriate.     Data Reviewed: I have personally reviewed following labs and imaging studies  CBC: Recent Labs  Lab 12/31/20 1630 01/01/21 0213  WBC 7.8 7.7  NEUTROABS 5.6  --   HGB 11.8* 10.8*  HCT 37.1* 33.7*  MCV 96.1 93.9  PLT 125* 063*   Basic Metabolic Panel: Recent Labs  Lab 12/31/20 1630 01/01/21 0213  NA 135 136  K 4.5 4.3  CL 97* 101  CO2 27 27  GLUCOSE 146* 112*  BUN 48* 47*  CREATININE 2.55* 2.26*  CALCIUM 8.9 8.6*   GFR: Estimated Creatinine Clearance: 14.8 mL/min (A) (by C-G formula based on SCr of 2.26 mg/dL (H)). Liver Function Tests: Recent Labs  Lab 12/31/20 1630  AST  29  ALT 23  ALKPHOS 82  BILITOT 1.0  PROT 6.6  ALBUMIN 3.6   No results for input(s): LIPASE, AMYLASE in the last 168 hours. No results for input(s): AMMONIA in the last 168 hours. Coagulation Profile: Recent Labs  Lab 12/31/20 1630 01/01/21 0213  INR 2.2* 2.2*   Cardiac Enzymes: No results for input(s): CKTOTAL, CKMB, CKMBINDEX, TROPONINI in the last 168 hours. BNP (last 3 results) No results for input(s): PROBNP in the last 8760 hours. HbA1C: No results for input(s): HGBA1C in the last 72 hours. CBG: No results for input(s): GLUCAP in the last 168 hours. Lipid  Profile: No results for input(s): CHOL, HDL, LDLCALC, TRIG, CHOLHDL, LDLDIRECT in the last 72 hours. Thyroid Function Tests: No results for input(s): TSH, T4TOTAL, FREET4, T3FREE, THYROIDAB in the last 72 hours. Anemia Panel: No results for input(s): VITAMINB12, FOLATE, FERRITIN, TIBC, IRON, RETICCTPCT in the last 72 hours. Sepsis Labs: No results for input(s): PROCALCITON, LATICACIDVEN in the last 168 hours.  Recent Results (from the past 240 hour(s))  Resp Panel by RT-PCR (Flu A&B, Covid) Nasopharyngeal Swab     Status: None   Collection Time: 12/31/20  4:46 PM   Specimen: Nasopharyngeal Swab; Nasopharyngeal(NP) swabs in vial transport medium  Result Value Ref Range Status   SARS Coronavirus 2 by RT PCR NEGATIVE NEGATIVE Final    Comment: (NOTE) SARS-CoV-2 target nucleic acids are NOT DETECTED.  The SARS-CoV-2 RNA is generally detectable in upper respiratory specimens during the acute phase of infection. The lowest concentration of SARS-CoV-2 viral copies this assay can detect is 138 copies/mL. A negative result does not preclude SARS-Cov-2 infection and should not be used as the sole basis for treatment or other patient management decisions. A negative result may occur with  improper specimen collection/handling, submission of specimen other than nasopharyngeal swab, presence of viral mutation(s) within the areas targeted by this assay, and inadequate number of viral copies(<138 copies/mL). A negative result must be combined with clinical observations, patient history, and epidemiological information. The expected result is Negative.  Fact Sheet for Patients:  EntrepreneurPulse.com.au  Fact Sheet for Healthcare Providers:  IncredibleEmployment.be  This test is no t yet approved or cleared by the [place] FDA and  has been authorized for detection and/or diagnosis of SARS-CoV-2 by FDA under an Emergency Use Authorization (EUA). This EUA  will remain  in effect (meaning this test can be used) for the duration of the COVID-19 declaration under Section 564(b)(1) of the Act, 21 U.S.C.section 360bbb-3(b)(1), unless the authorization is terminated  or revoked sooner.       Influenza A by PCR NEGATIVE NEGATIVE Final   Influenza B by PCR NEGATIVE NEGATIVE Final    Comment: (NOTE) The Xpert Xpress SARS-CoV-2/FLU/RSV plus assay is intended as an aid in the diagnosis of influenza from Nasopharyngeal swab specimens and should not be used as a sole basis for treatment. Nasal washings and aspirates are unacceptable for Xpert Xpress SARS-CoV-2/FLU/RSV testing.  Fact Sheet for Patients: EntrepreneurPulse.com.au  Fact Sheet for Healthcare Providers: IncredibleEmployment.be  This test is not yet approved or cleared by the [place] FDA and has been authorized for detection and/or diagnosis of SARS-CoV-2 by FDA under an Emergency Use Authorization (EUA). This EUA will remain in effect (meaning this test can be used) for the duration of the COVID-19 declaration under Section 564(b)(1) of the Act, 21 U.S.C. section 360bbb-3(b)(1), unless the authorization is terminated or revoked.  Performed at [facility], [place]  [address] [place], [place] [address]          Radiology Studies: CT Head Wo Contrast  Result Date: 12/31/2020 CLINICAL DATA:  87 year old male with head trauma. EXAM: CT HEAD WITHOUT CONTRAST CT CERVICAL SPINE WITHOUT CONTRAST TECHNIQUE: Multidetector CT imaging of the head and cervical spine was performed following the standard protocol without intravenous contrast. Multiplanar CT image reconstructions of the cervical spine were also generated. COMPARISON:  None. FINDINGS: CT HEAD FINDINGS Brain: Mild age-related atrophy and chronic microvascular ischemic changes. There is no acute intracranial hemorrhage. No mass effect or midline shift. No extra-axial fluid collection.  Vascular: No hyperdense vessel or unexpected calcification. Skull: Normal. Negative for fracture or focal lesion. Sinuses/Orbits: No acute finding. Other: Right posterior parietal scalp laceration. No large hematoma. CT CERVICAL SPINE FINDINGS Alignment: No acute subluxation. Skull base and vertebrae: No acute fracture. Osteopenia. Soft tissues and spinal canal: No prevertebral fluid or swelling. No visible canal hematoma. Disc levels: Multilevel degenerative changes with disc space narrowing and endplate irregularity. Upper chest: Partially visualized bilateral pleural effusions. There is diffuse interstitial and interlobular thickening, likely edema. Clinical correlation is recommended. Other: Bilateral carotid bulb calcified plaques. IMPRESSION: 1. No acute intracranial pathology. Age-related atrophy and chronic microvascular ischemic changes. 2. No acute/traumatic cervical spine pathology. Multilevel degenerative changes. 3. Partially visualized bilateral pleural effusions and findings of pulmonary edema. Clinical correlation is recommended. Electronically Signed   By: Anner Crete M.D.   On: 12/31/2020 17:14   CT Cervical Spine Wo Contrast  Result Date: 12/31/2020 CLINICAL DATA:  87 year old male with head trauma. EXAM: CT HEAD WITHOUT CONTRAST CT CERVICAL SPINE WITHOUT CONTRAST TECHNIQUE: Multidetector CT imaging of the head and cervical spine was performed following the standard protocol without intravenous contrast. Multiplanar CT image reconstructions of the cervical spine were also generated. COMPARISON:  None. FINDINGS: CT HEAD FINDINGS Brain: Mild age-related atrophy and chronic microvascular ischemic changes. There is no acute intracranial hemorrhage. No mass effect or midline shift. No extra-axial fluid collection. Vascular: No hyperdense vessel or unexpected calcification. Skull: Normal. Negative for fracture or focal lesion. Sinuses/Orbits: No acute finding. Other: Right posterior parietal  scalp laceration. No large hematoma. CT CERVICAL SPINE FINDINGS Alignment: No acute subluxation. Skull base and vertebrae: No acute fracture. Osteopenia. Soft tissues and spinal canal: No prevertebral fluid or swelling. No visible canal hematoma. Disc levels: Multilevel degenerative changes with disc space narrowing and endplate irregularity. Upper chest: Partially visualized bilateral pleural effusions. There is diffuse interstitial and interlobular thickening, likely edema. Clinical correlation is recommended. Other: Bilateral carotid bulb calcified plaques. IMPRESSION: 1. No acute intracranial pathology. Age-related atrophy and chronic microvascular ischemic changes. 2. No acute/traumatic cervical spine pathology. Multilevel degenerative changes. 3. Partially visualized bilateral pleural effusions and findings of pulmonary edema. Clinical correlation is recommended. Electronically Signed   By: Anner Crete M.D.   On: 12/31/2020 17:14   DG Pelvis Portable  Result Date: 12/31/2020 CLINICAL DATA:  Fall. EXAM: PORTABLE PELVIS 1-2 VIEWS COMPARISON:  None. FINDINGS: There is no evidence of pelvic fracture or diastasis. No pelvic bone lesions are seen. IMPRESSION: Negative. Electronically Signed   By: Marijo Conception M.D.   On: 12/31/2020 16:48   DG Chest Port 1 View  Result Date: 12/31/2020 CLINICAL DATA:  87 year old male with fall. EXAM: PORTABLE CHEST 1 VIEW COMPARISON:  Chest radiograph dated 04/17/2020. FINDINGS: Shallow inspiration. Bilateral interstitial coarsening and hazy airspace densities primarily involving the mid to lower lung field and subpleural region of the lungs, progressed since the  prior radiograph and may represent progression of underlying fibrosis or sequela of prior inflammatory/infectious process including prior COVID-19 infection. Clinical correlation is recommended. There is no pleural effusion pneumothorax. Stable cardiac silhouette. Median sternotomy wires and CABG vascular  clips. No acute osseous pathology. IMPRESSION: Progression of bilateral pulmonary densities, likely worsening fibrosis or sequela of prior inflammatory/infectious process. Electronically Signed   By: Anner Crete M.D.   On: 12/31/2020 16:48        Scheduled Meds: . cholecalciferol  1,000 Units Oral Daily  . furosemide  40 mg Intravenous BID  . levothyroxine  88 mcg Oral QAC breakfast  . loratadine  10 mg Oral Daily  . metoprolol succinate  25 mg Oral Daily  . pantoprazole  40 mg Oral Daily  . sertraline  25 mg Oral BID  . simvastatin  10 mg Oral Daily  . Warfarin - Pharmacist Dosing Inpatient   Does not apply q1600   Continuous Infusions:   LOS: 1 day    Time spent: 39 minutes spent on chart review, discussion with nursing staff, consultants, updating family and interview/physical exam; more than 50% of that time was spent in counseling and/or coordination of care.    Mikeala Girdler J [place] DO [facility] Available via Epic secure chat 7am-7pm After these hours, please refer to coverage provider listed on [email or web address] 01/01/2021, 12:07 PM

## 2021-01-01 NOTE — Evaluation (Addendum)
Occupational Therapy Evaluation Patient Details Name: [name] MRN: [id number] DOB: 04/01/1920 Today's Date: 01/01/2021    History of Present Illness Pt is a 87 y.o. male admitted from ALF on 12/31/20 with syncopal episode (+ LOC), fall and hitting head; pt reports similar episode 2 months prior, as well as worsening DOE and BLE edema. Workup revealed R posterior head lac post-staples, hypoxia, pulmonary edema, AKI. Head CT negative for acute injury. PMH includes afib on coumadin, HF, CKD IV.   Clinical Impression   PTA patient reports independent using rollator for mobility, some assist required for LB ADLs and bathing (showers 2x/week with staff) and assist for meds/meals.  Patient admitted for above and limited by problem list below, including impaired balance, decreased activity tolerance, generalized weakness.  He requires up to min assist for transfers, setup for UB ADLs seated and up to max assist for LB ADLs.  He reports his wife (who lives across the hall in [place]) or staff typically assist for LB dressing but he has reacher, sock aide and shoe horn at home as well.  Based on performance today, believe he will benefit from further OT services while admitted and after dc at [facility] level, given supervision assist for ADLs and mobility, to optimize independence and safety.  Will follow acutely.   BP: supine 105/66 HR 80        Seated 116/73 HR 91        Stand  105/79 HR 91     Follow Up Recommendations  Home health OT;Supervision - Intermittent (assist for ADLs and mobility)    Equipment Recommendations  3 in 1 bedside commode    Recommendations for Other Services       Precautions / Restrictions Precautions Precautions: Fall Restrictions Weight Bearing Restrictions: No      Mobility Bed Mobility Overal bed mobility: Needs Assistance Bed Mobility: Supine to Sit     Supine to sit: Min assist     General bed mobility comments: for trunk support to ascend, increased  time and effort    Transfers Overall transfer level: Needs assistance Equipment used: Rolling walker (2 wheeled) Transfers: Sit to/from Stand Sit to Stand: Min assist;From elevated surface         General transfer comment: min assist for inital stand, fading to min guard with cueing for hand placement and safety    Balance Overall balance assessment: Needs assistance Sitting-balance support: Feet supported;No upper extremity supported Sitting balance-Leahy Scale: Fair     Standing balance support: Bilateral upper extremity supported;During functional activity Standing balance-Leahy Scale: Poor Standing balance comment: relies on RW                           ADL either performed or assessed with clinical judgement   ADL Overall ADL's : Needs assistance/impaired     Grooming: Set up;Sitting   Upper Body Bathing: Set up;Sitting   Lower Body Bathing: Moderate assistance;Sit to/from stand   Upper Body Dressing : Supervision/safety;Sitting   Lower Body Dressing: Maximal assistance;Sit to/from stand Lower Body Dressing Details (indicate cue type and reason): requires assist with socks, min assist sit to stand Toilet Transfer: Minimal assistance;Ambulation;RW           Functional mobility during ADLs: Minimal assistance;Rolling walker General ADL Comments: pt limited by weakness, decreased activity tolerance, impaired balance     Vision   Vision Assessment?: No apparent visual deficits     Perception  Praxis      Pertinent Vitals/Pain Pain Assessment: No/denies pain     Hand Dominance Right   Extremity/Trunk Assessment Upper Extremity Assessment Upper Extremity Assessment: Generalized weakness   Lower Extremity Assessment Lower Extremity Assessment: Defer to PT evaluation   Cervical / Trunk Assessment Cervical / Trunk Assessment: Kyphotic   Communication Communication Communication: HOH   Cognition Arousal/Alertness:  Awake/alert Behavior During Therapy: WFL for tasks assessed/performed Overall Cognitive Status: Within Functional Limits for tasks assessed                                 General Comments: appears WFL for tasks assessed   General Comments  VSS, BP stable with positional changes, on 2L [place]    Exercises     Shoulder Instructions      Home Living Family/patient expects to be discharged to:: Assisted living                             Home Equipment: Walker - 4 wheels;Grab bars - toilet;Shower seat - built in;Hand held shower head;Grab bars - tub/shower;Electric scooter   Additional Comments: wife lives across the hall      Prior Functioning/Environment Level of Independence: Needs assistance  Gait / Transfers Assistance Needed: uses rollator for short distances, scooter for longer distances ADL's / Homemaking Assistance Needed: reports independent with ADLs, sometimes wife assist with LB dressing; 2 showers a week with assist; toilets independently; Assist with meals and med from staff   Comments: recently was in PT but ended appox 1 week ago        OT Problem List: Decreased strength;Decreased activity tolerance;Impaired balance (sitting and/or standing);Decreased knowledge of use of DME or AE;Decreased knowledge of precautions;Decreased safety awareness      OT Treatment/Interventions: Self-care/ADL training;DME and/or AE instruction;Energy conservation;Therapeutic activities;Patient/family education;Balance training    OT Goals(Current goals can be found in the care plan section) Acute Rehab OT Goals Patient Stated Goal: home OT Goal Formulation: With patient Time For Goal Achievement: 01/15/21 Potential to Achieve Goals: Good  OT Frequency: Min 2X/week   Barriers to D/C:            Co-evaluation              AM-PAC OT "6 Clicks" Daily Activity     Outcome Measure Help from another person eating meals?: None Help from another  person taking care of personal grooming?: A Little Help from another person toileting, which includes using toliet, bedpan, or urinal?: A Little Help from another person bathing (including washing, rinsing, drying)?: A Lot Help from another person to put on and taking off regular upper body clothing?: A Little Help from another person to put on and taking off regular lower body clothing?: A Lot 6 Click Score: 17   End of Session Equipment Utilized During Treatment: Gait belt;Rolling walker;Oxygen Nurse Communication: Mobility status  Activity Tolerance: Patient tolerated treatment well Patient left: Other (comment) (with PT)  OT Visit Diagnosis: Other abnormalities of gait and mobility (R26.89);Muscle weakness (generalized) (M62.81);History of falling (Z91.81)                Time: 3300-7622 OT Time Calculation (min): 28 min Charges:  OT General Charges $OT Visit: 1 Visit OT Evaluation $OT Eval Moderate Complexity: 1 Mod OT Treatments $Self Care/Home Management : 8-22 mins  Jolaine Artist, OT Acute Rehabilitation Services Pager [phone]  Office [place] 01/01/2021, 10:43 AM

## 2021-01-02 DIAGNOSIS — R55 Syncope and collapse: Secondary | ICD-10-CM | POA: Diagnosis not present

## 2021-01-02 DIAGNOSIS — I4891 Unspecified atrial fibrillation: Secondary | ICD-10-CM

## 2021-01-02 DIAGNOSIS — I5041 Acute combined systolic (congestive) and diastolic (congestive) heart failure: Secondary | ICD-10-CM | POA: Diagnosis not present

## 2021-01-02 DIAGNOSIS — N179 Acute kidney failure, unspecified: Secondary | ICD-10-CM | POA: Diagnosis not present

## 2021-01-02 DIAGNOSIS — E039 Hypothyroidism, unspecified: Secondary | ICD-10-CM | POA: Diagnosis not present

## 2021-01-02 DIAGNOSIS — J9601 Acute respiratory failure with hypoxia: Secondary | ICD-10-CM | POA: Diagnosis not present

## 2021-01-02 LAB — BASIC METABOLIC PANEL
Anion gap: 8 (ref 5–15)
BUN: 46 mg/dL — ABNORMAL HIGH (ref 8–23)
CO2: 26 mmol/L (ref 22–32)
Calcium: 8.3 mg/dL — ABNORMAL LOW (ref 8.9–10.3)
Chloride: 102 mmol/L (ref 98–111)
Creatinine, Ser: 2.08 mg/dL — ABNORMAL HIGH (ref 0.61–1.24)
GFR, Estimated: 28 mL/min — ABNORMAL LOW (ref >60)
Glucose, Bld: 100 mg/dL — ABNORMAL HIGH (ref 70–99)
Potassium: 4 mmol/L (ref 3.5–5.1)
Sodium: 136 mmol/L (ref 135–145)

## 2021-01-02 LAB — PROTIME-INR
INR: 2.2 — ABNORMAL HIGH (ref 0.8–1.2)
Prothrombin Time: 24.6 seconds — ABNORMAL HIGH (ref 11.4–15.2)

## 2021-01-02 MED ORDER — BISACODYL 10 MG RE SUPP: 10.0000 mg | Freq: Once | RECTAL | Status: DC

## 2021-01-02 MED ORDER — BISACODYL 5 MG PO TBEC
10.0000 mg | DELAYED_RELEASE_TABLET | Freq: Once | ORAL | Status: AC
  Administered 2021-01-02: 10 mg via ORAL
  Filled 2021-01-02: qty 2

## 2021-01-02 MED ORDER — WARFARIN SODIUM 1 MG PO TABS
1.5000 mg | ORAL_TABLET | ORAL | Status: DC
  Administered 2021-01-02: 1.5 mg via ORAL
  Filled 2021-01-02: qty 1

## 2021-01-02 MED ORDER — WARFARIN SODIUM 1 MG PO TABS
1.0000 mg | ORAL_TABLET | ORAL | Status: DC
  Administered 2021-01-03: 1 mg via ORAL
  Filled 2021-01-02: qty 1

## 2021-01-02 MED ORDER — ACETAMINOPHEN 325 MG PO TABS
650.0000 mg | ORAL_TABLET | Freq: Once | ORAL | Status: AC
  Administered 2021-01-02: 650 mg via ORAL
  Filled 2021-01-02: qty 2

## 2021-01-02 NOTE — Progress Notes (Signed)
PROGRESS NOTE    [name]  YDX:[id number] DOB: [birth year] DOA: 12/31/2020 PCP: Merryl Hacker, No    Brief Narrative:  [name] is a 87 year old male with past medical history significant for paroxysmal atrial fibrillation on Coumadin, chronic diastolic congestive heart failure, CKD stage IV, anemia due to CKD, HLD, hypothyroidism, depression, anxiety who presented to the ED with concerns of syncope and fall.  Patient was apparently trying to plug in his iPad but suddenly lost consciousness and fell backwards hitting his head on a bedside table.  Patient sustained laceration to his right posterior scalp.  Patient reports increasing dyspnea on exertion for the past 2-3 months with worsening chronic lower extremity edema.  Recently had his Lasix dose decreased due to worsening creatinine level.  Similar episode roughly 2 months ago when he was sitting up in bed.  Denies dizziness or lightheadedness.  No chest pain or palpitations.  In the ED, temperature 97.7 F, HR 80, RR 20, BP 142/76, SPO2 86% on 4 L nasal cannula.  Sodium 135, potassium 4.5, chloride 97, CO2 27, glucose 146, BUN 48, creatinine 2.55.  BNP 749.5.  Troponin 104>107.  WBC 7.8, hemoglobin 11.8, platelets 125.  Cova-19 PCR negative.  Influenza A/B PCR negative.  CT head/C-spine with no acute intracranial pathology, age-related atrophy and chronic microvascular ischemic changes, no acute/traumatic cervical spine pathology, partially visualized bilateral pleural effusions and findings of pulmonary edema.  Chest x-ray with progression of bilateral pulmonary densities, likely worsening fibrosis or sequela of prior inflammatory/infectious process. Pelvis x-ray negative.  EDP consulted hospital service for further evaluation management of acute on chronic diastolic congestive heart failure.   Assessment & Plan:   Principal Problem:   Acute CHF (congestive heart failure) (HCC) Active Problems:   Syncope and collapse   Acute  respiratory failure with hypoxia (HCC)   Acute-on-chronic kidney injury ([place])   Scalp laceration   Unspecified atrial fibrillation (HCC)   HLD (hyperlipidemia)   Hypothyroidism   Acute hypoxic respiratory failure, POA Acute on chronic diastolic congestive heart failure Patient presenting to the ED with progressive shortness of breath, lower extremity edema and syncopal event with recent decrease in home diuretic regimen.  Patient was noted to have pulmonary edema on CT scanning with elevated BNP.  Patient hypoxic on presentation with SPO2 86% on 4 L nasal cannula.  TTE with LVEF 40-45%, mild LV hypokinesis, moderate inferior lateral wall hypokinesis, mild concentric LVH, RV function mildly reduced, RA moderately dilated, mild MR/TR. --Cardiology following, appreciate assistance --net negative 739mL past 24h and net negative 1.5L since admission --wt 68>67.9> no weight today kg --Furosemide 40 mg IV q12h --Not on ACE inhibitor/ARB/ARNI due to renal dysfunction --Strict I's and O's Daily weights --Continue supplemental oxygen, maintain SPO2 greater than 92%, down to 2L [place] --BMP daily  Syncope with loss of consciousness Suspect secondary to prolonged hypoxia from diastolic CHF as above.  TTE with mildly reduced LVEF, otherwise no significant valvular abnormalities.  No concerning arrhythmias or high-grade AV block noted on telemetry such far. --Continue to monitor for arrhythmias on telemetry --PT/OT recommend home health on discharge --Will likely need supplemental oxygen on discharge  Acute renal failure on CKD stage IV Baseline creatinine 2.2 to 2.5.  On admission, creatinine elevated 2.55.  Etiology likely volume overload.   --Cr 2.55>2.26>2.08 --Continue IV diuresis as above --Avoid nephrotoxins, renal dose all medications --BMP daily  Posterior scalp laceration Following syncopal event at home, patient sustained posterior scalp laceration.  Underwent skin approximation by  EDP  with staples on 12/31/2020. --Will need staple removal 7 days (5/31)  Permanent atrial fibrillation --Metoprolol succinate 25 mg p.o. 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  Not a surgical candidate given his age and comorbidities.  Patient showed normal aortic root but ascending aorta was not well visualized.  Given his advanced age and comorbidities, not a candidate for surgical repair per cardiology.  Hyperlipidemia: Simvastatin 10 mg p.o. daily  Hypothyroidism: Levothyroxine 88 mcg p.o. daily  Depression/anxiety --Sertraline 25 mg p.o. twice daily --Diazepam 2.5 mg p.o. every 6 hours as needed anxiety  GERD: Continue PPI   DVT prophylaxis: coumadin   Code Status: DNR Family Communication: updated patient's daughter [name] via telephone this afternoon.  Disposition Plan:  Level of care: Telemetry Cardiac Status is: Inpatient  Remains inpatient appropriate because:Ongoing diagnostic testing needed not appropriate for outpatient work up, Unsafe d/c plan, IV treatments appropriate due to intensity of illness or inability to take PO and Inpatient level of care appropriate due to severity of illness   Dispo: The patient is from: [place]              Anticipated d/c is to: To be determined              Patient currently is not medically stable to d/c.   Difficult to place patient No    Consultants:   Cardiology  Procedures:   Scalp laceration repair with staples 12/31/2020  Antimicrobials:   None   Subjective: Patient seen examined at bedside, resting comfortably.  Eating breakfast.  No family present in room this morning.  Updated patient's daughter [name] via telephone this afternoon.  Dyspnea improved.  Continues to desaturate on ambulation with PT this morning, stable on 2 L nasal cannula.  No other questions or  concerns at this time.  Denies headache, no fever/chills/night sweats, no dizziness, no visual changes, no chest pain, no abdominal pain, no weakness, no fatigue, no paresthesias.  No acute events overnight per nurse staff.  Objective: Vitals:   01/02/21 0556 01/02/21 0640 01/02/21 0857 01/02/21 1200  BP:  114/71 139/84   Pulse:  84    Resp:  19 18   Temp: 98.6 F (37 C)   97.9 F (36.6 C)  TempSrc: Oral   Oral  SpO2:  97% 93%   Weight:      Height:        Intake/Output Summary (Last 24 hours) at 01/02/2021 1217 Last data filed at 01/02/2021 1205 Gross per 24 hour  Intake 600 ml  Output 1350 ml  Net -750 ml   Filed Weights   12/31/20 1648 01/01/21 0015  Weight: 68 kg 67.9 kg    Examination:  General exam: Appears calm and comfortable, elderly in appearance Respiratory system: Breath sounds slightly decreased bilateral bases with mild crackles, no wheezing, normal [place] effort, on 2 L nasal cannula with SPO2 96% Cardiovascular system: S1 & S2 heard, irregularly irregular rhythm, normal rate. No JVD, murmurs, rubs, gallops or clicks.  2+ pitting edema bilateral lower extremities to knee Gastrointestinal system: Abdomen is nondistended, soft and nontender. No organomegaly or masses felt. Normal bowel sounds heard. Central nervous system: Alert and oriented. No focal neurological deficits. Extremities: Symmetric 5 x 5 power. Skin: No rashes, lesions or ulcers Psychiatry: Judgement and insight appear normal. Mood & affect appropriate.  Data Reviewed: I have personally reviewed following labs and imaging studies  CBC: Recent Labs  Lab 12/31/20 1630 01/01/21 0213  WBC 7.8 7.7  NEUTROABS 5.6  --   HGB 11.8* 10.8*  HCT 37.1* 33.7*  MCV 96.1 93.9  PLT 125* 270*   Basic Metabolic Panel: Recent Labs  Lab 12/31/20 1630 01/01/21 0213 01/02/21 0301  NA 135 136 136  K 4.5 4.3 4.0  CL 97* 101 102  CO2 27 27 26   GLUCOSE 146* 112* 100*  BUN 48* 47* 46*  CREATININE  2.55* 2.26* 2.08*  CALCIUM 8.9 8.6* 8.3*   GFR: Estimated Creatinine Clearance: 16 mL/min (A) (by C-G formula based on SCr of 2.08 mg/dL (H)). Liver Function Tests: Recent Labs  Lab 12/31/20 1630  AST 29  ALT 23  ALKPHOS 82  BILITOT 1.0  PROT 6.6  ALBUMIN 3.6   No results for input(s): LIPASE, AMYLASE in the last 168 hours. No results for input(s): AMMONIA in the last 168 hours. Coagulation Profile: Recent Labs  Lab 12/31/20 1630 01/01/21 0213 01/02/21 0301  INR 2.2* 2.2* 2.2*   Cardiac Enzymes: No results for input(s): CKTOTAL, CKMB, CKMBINDEX, TROPONINI in the last 168 hours. BNP (last 3 results) No results for input(s): PROBNP in the last 8760 hours. HbA1C: No results for input(s): HGBA1C in the last 72 hours. CBG: No results for input(s): GLUCAP in the last 168 hours. Lipid Profile: No results for input(s): CHOL, HDL, LDLCALC, TRIG, CHOLHDL, LDLDIRECT in the last 72 hours. Thyroid Function Tests: No results for input(s): TSH, T4TOTAL, FREET4, T3FREE, THYROIDAB in the last 72 hours. Anemia Panel: No results for input(s): VITAMINB12, FOLATE, FERRITIN, TIBC, IRON, RETICCTPCT in the last 72 hours. Sepsis Labs: No results for input(s): PROCALCITON, LATICACIDVEN in the last 168 hours.  Recent Results (from the past 240 hour(s))  Resp Panel by RT-PCR (Flu A&B, Covid) Nasopharyngeal Swab     Status: None   Collection Time: 12/31/20  4:46 PM   Specimen: Nasopharyngeal Swab; Nasopharyngeal(NP) swabs in vial transport medium  Result Value Ref Range Status   SARS Coronavirus 2 by RT PCR NEGATIVE NEGATIVE Final    Comment: (NOTE) SARS-CoV-2 target nucleic acids are NOT DETECTED.  The SARS-CoV-2 RNA is generally detectable in upper respiratory specimens during the acute phase of infection. The lowest concentration of SARS-CoV-2 viral copies this assay can detect is 138 copies/mL. A negative result does not preclude SARS-Cov-2 infection and should not be used as the sole  basis for treatment or other patient management decisions. A negative result may occur with  improper specimen collection/handling, submission of specimen other than nasopharyngeal swab, presence of viral mutation(s) within the areas targeted by this assay, and inadequate number of viral copies(<138 copies/mL). A negative result must be combined with clinical observations, patient history, and epidemiological information. The expected result is Negative.  Fact Sheet for Patients:  EntrepreneurPulse.com.au  Fact Sheet for Healthcare Providers:  IncredibleEmployment.be  This test is no t yet approved or cleared by the [place] FDA and  has been authorized for detection and/or diagnosis of SARS-CoV-2 by FDA under an Emergency Use Authorization (EUA). This EUA will remain  in effect (meaning this test can be used) for the duration of the COVID-19 declaration under Section 564(b)(1) of the Act, 21 U.S.C.section 360bbb-3(b)(1), unless the authorization is terminated  or revoked sooner.       Influenza A by PCR NEGATIVE NEGATIVE Final   Influenza B by PCR NEGATIVE NEGATIVE Final  Comment: (NOTE) The Xpert Xpress SARS-CoV-2/FLU/RSV plus assay is intended as an aid in the diagnosis of influenza from Nasopharyngeal swab specimens and should not be used as a sole basis for treatment. Nasal washings and aspirates are unacceptable for Xpert Xpress SARS-CoV-2/FLU/RSV testing.  Fact Sheet for Patients: EntrepreneurPulse.com.au  Fact Sheet for Healthcare Providers: IncredibleEmployment.be  This test is not yet approved or cleared by the [place] FDA and has been authorized for detection and/or diagnosis of SARS-CoV-2 by FDA under an Emergency Use Authorization (EUA). This EUA will remain in effect (meaning this test can be used) for the duration of the COVID-19 declaration under Section 564(b)(1) of the Act,  21 U.S.C. section 360bbb-3(b)(1), unless the authorization is terminated or revoked.  Performed at [facility], [place] [address] [place], [place] [address]          Radiology Studies: CT Head Wo Contrast  Result Date: 12/31/2020 CLINICAL DATA:  87 year old male with head trauma. EXAM: CT HEAD WITHOUT CONTRAST CT CERVICAL SPINE WITHOUT CONTRAST TECHNIQUE: Multidetector CT imaging of the head and cervical spine was performed following the standard protocol without intravenous contrast. Multiplanar CT image reconstructions of the cervical spine were also generated. COMPARISON:  None. FINDINGS: CT HEAD FINDINGS Brain: Mild age-related atrophy and chronic microvascular ischemic changes. There is no acute intracranial hemorrhage. No mass effect or midline shift. No extra-axial fluid collection. Vascular: No hyperdense vessel or unexpected calcification. Skull: Normal. Negative for fracture or focal lesion. Sinuses/Orbits: No acute finding. Other: Right posterior parietal scalp laceration. No large hematoma. CT CERVICAL SPINE FINDINGS Alignment: No acute subluxation. Skull base and vertebrae: No acute fracture. Osteopenia. Soft tissues and spinal canal: No prevertebral fluid or swelling. No visible canal hematoma. Disc levels: Multilevel degenerative changes with disc space narrowing and endplate irregularity. Upper chest: Partially visualized bilateral pleural effusions. There is diffuse interstitial and interlobular thickening, likely edema. Clinical correlation is recommended. Other: Bilateral carotid bulb calcified plaques. IMPRESSION: 1. No acute intracranial pathology. Age-related atrophy and chronic microvascular ischemic changes. 2. No acute/traumatic cervical spine pathology. Multilevel degenerative changes. 3. Partially visualized bilateral pleural effusions and findings of pulmonary edema. Clinical correlation is recommended. Electronically Signed   By: Anner Crete M.D.   On:  12/31/2020 17:14   CT Cervical Spine Wo Contrast  Result Date: 12/31/2020 CLINICAL DATA:  87 year old male with head trauma. EXAM: CT HEAD WITHOUT CONTRAST CT CERVICAL SPINE WITHOUT CONTRAST TECHNIQUE: Multidetector CT imaging of the head and cervical spine was performed following the standard protocol without intravenous contrast. Multiplanar CT image reconstructions of the cervical spine were also generated. COMPARISON:  None. FINDINGS: CT HEAD FINDINGS Brain: Mild age-related atrophy and chronic microvascular ischemic changes. There is no acute intracranial hemorrhage. No mass effect or midline shift. No extra-axial fluid collection. Vascular: No hyperdense vessel or unexpected calcification. Skull: Normal. Negative for fracture or focal lesion. Sinuses/Orbits: No acute finding. Other: Right posterior parietal scalp laceration. No large hematoma. CT CERVICAL SPINE FINDINGS Alignment: No acute subluxation. Skull base and vertebrae: No acute fracture. Osteopenia. Soft tissues and spinal canal: No prevertebral fluid or swelling. No visible canal hematoma. Disc levels: Multilevel degenerative changes with disc space narrowing and endplate irregularity. Upper chest: Partially visualized bilateral pleural effusions. There is diffuse interstitial and interlobular thickening, likely edema. Clinical correlation is recommended. Other: Bilateral carotid bulb calcified plaques. IMPRESSION: 1. No acute intracranial pathology. Age-related atrophy and chronic microvascular ischemic changes. 2. No acute/traumatic cervical spine pathology. Multilevel degenerative changes. 3. Partially visualized bilateral  pleural effusions and findings of pulmonary edema. Clinical correlation is recommended. Electronically Signed   By: Anner Crete M.D.   On: 12/31/2020 17:14   DG Pelvis Portable  Result Date: 12/31/2020 CLINICAL DATA:  Fall. EXAM: PORTABLE PELVIS 1-2 VIEWS COMPARISON:  None. FINDINGS: There is no evidence of pelvic  fracture or diastasis. No pelvic bone lesions are seen. IMPRESSION: Negative. Electronically Signed   By: Marijo Conception M.D.   On: 12/31/2020 16:48   DG Chest Port 1 View  Result Date: 12/31/2020 CLINICAL DATA:  87 year old male with fall. EXAM: PORTABLE CHEST 1 VIEW COMPARISON:  Chest radiograph dated 04/17/2020. FINDINGS: Shallow inspiration. Bilateral interstitial coarsening and hazy airspace densities primarily involving the mid to lower lung field and subpleural region of the lungs, progressed since the prior radiograph and may represent progression of underlying fibrosis or sequela of prior inflammatory/infectious process including prior COVID-19 infection. Clinical correlation is recommended. There is no pleural effusion pneumothorax. Stable cardiac silhouette. Median sternotomy wires and CABG vascular clips. No acute osseous pathology. IMPRESSION: Progression of bilateral pulmonary densities, likely worsening fibrosis or sequela of prior inflammatory/infectious process. Electronically Signed   By: Anner Crete M.D.   On: 12/31/2020 16:48   ECHOCARDIOGRAM COMPLETE  Result Date: 01/01/2021    ECHOCARDIOGRAM REPORT   Patient Name:   [name] Date of Exam: 01/01/2021 Medical Rec #:  [id number]         Height:       60.0 in Accession #:    [id number]        Weight:       149.7 lb Date of Birth:  [birth year]         BSA:          1.650 m Patient Age:    29 years          BP:           117/70 mmHg Patient Gender: M                 HR:           99 bpm. Exam Location:  Inpatient Procedure: 2D Echo, Cardiac Doppler and Color Doppler Indications:    CHF  History:        Patient has no prior history of Echocardiogram examinations.                 CHF, CAD and LA myxoma, Prior CABG, Aortic Valve Disease,                 Arrythmias:Atrial Fibrillation, Signs/Symptoms:Shortness of                 Breath; Risk Factors:Hypertension. CKD.  Sonographer:    Dustin Flock Referring Phys: [id number] [place]  1. There appears to be global left ventricular mild hypokinesis, but there is moderate inferolateral wall hypokinesis. Left ventricular ejection fraction, by estimation, is 40 to 45%. The left ventricle has mildly decreased function. The left ventricle demonstrates regional wall motion abnormalities (see scoring diagram/findings for description). There is mild concentric left ventricular hypertrophy. Left ventricular diastolic function could not be evaluated.  2. Right ventricular systolic function is moderately reduced. The right ventricular size is mildly enlarged. There is moderately elevated pulmonary artery systolic pressure.  3. No left atrial mass is seen. Left atrial size was severely dilated.  4. Right atrial size was moderately dilated.  5. The mitral valve is degenerative. Mild mitral valve regurgitation. No  evidence of mitral stenosis.  6. Tricuspid valve regurgitation is mild to moderate.  7. The aortic valve is tricuspid. There is mild calcification of the aortic valve. There is moderate thickening of the aortic valve. Aortic valve regurgitation is mild to moderate. Mild to moderate aortic valve sclerosis/calcification is present, without any evidence of aortic stenosis. Comparison(s): Prior images unable to be directly viewed, comparison made by report only. The left ventricular function is worsened. The right ventricular hypertrophy is worse. FINDINGS  Left Ventricle: There appears to be global left ventricular mild hypokinesis, but there is moderate inferolateral wall hypokinesis. Left ventricular ejection fraction, by estimation, is 40 to 45%. The left ventricle has mildly decreased function. The left ventricle demonstrates regional wall motion abnormalities. The left ventricular internal cavity size was normal in size. There is mild concentric left ventricular hypertrophy. Abnormal (paradoxical) septal motion consistent with post-operative status. Left ventricular diastolic  function could not be evaluated due to atrial fibrillation. Left ventricular diastolic function could not be evaluated. Right Ventricle: The right ventricular size is mildly enlarged. No increase in right ventricular wall thickness. Right ventricular systolic function is moderately reduced. There is moderately elevated pulmonary artery systolic pressure. The tricuspid regurgitant velocity is 3.29 m/s, and with an assumed right atrial pressure of 5 mmHg, the estimated right ventricular systolic pressure is 21.1 mmHg. Left Atrium: No left atrial mass is seen. Left atrial size was severely dilated. Right Atrium: Right atrial size was moderately dilated. Pericardium: There is no evidence of pericardial effusion. Mitral Valve: The mitral valve is degenerative in appearance. There is mild thickening of the mitral valve leaflet(s). Mild to moderate mitral annular calcification. Mild mitral valve regurgitation, with centrally-directed jet. No evidence of mitral valve stenosis. Tricuspid Valve: The tricuspid valve is normal in structure. Tricuspid valve regurgitation is mild to moderate. Aortic Valve: The aortic valve is tricuspid. There is mild calcification of the aortic valve. There is moderate thickening of the aortic valve. Aortic valve regurgitation is mild to moderate. Aortic regurgitation PHT measures 492 msec. Mild to moderate aortic valve sclerosis/calcification is present, without any evidence of aortic stenosis. Aortic valve mean gradient measures 4.0 mmHg. Aortic valve peak gradient measures 8.2 mmHg. Aortic valve area, by VTI measures 3.35 cm. Pulmonic Valve: The pulmonic valve was not well visualized. Pulmonic valve regurgitation is not visualized. Aorta: The aortic root is normal in size and structure and the ascending aorta was not well visualized. Venous: The inferior vena cava was not well visualized. IAS/Shunts: No atrial level shunt detected by color flow Doppler. There is no evidence of an atrial  septal defect.  LEFT VENTRICLE PLAX 2D LVIDd:         4.60 cm  Diastology LVIDs:         3.20 cm  LV e' medial:    2.51 cm/s LV PW:         1.30 cm  LV E/e' medial:  29.2 LV IVS:        1.20 cm  LV e' lateral:   3.16 cm/s LVOT diam:     2.30 cm  LV E/e' lateral: 23.2 LV SV:         85 LV SV Index:   52 LVOT Area:     4.15 cm  RIGHT VENTRICLE RV Basal diam:  4.40 cm RV S prime:     6.24 cm/s TAPSE (M-mode): 1.6 cm LEFT ATRIUM             Index  RIGHT ATRIUM           Index LA diam:        5.00 cm 3.03 cm/m  RA Area:     24.30 cm LA Vol (A2C):   52.9 ml 32.05 ml/m RA Volume:   77.00 ml  46.66 ml/m LA Vol (A4C):   66.0 ml 39.99 ml/m LA Biplane Vol: 61.6 ml 37.33 ml/m  AORTIC VALVE AV Area (Vmax):    3.13 cm AV Area (Vmean):   3.27 cm AV Area (VTI):     3.35 cm AV Vmax:           143.50 cm/s AV Vmean:          93.100 cm/s AV VTI:            0.254 m AV Peak Grad:      8.2 mmHg AV Mean Grad:      4.0 mmHg LVOT Vmax:         108.00 cm/s LVOT Vmean:        73.200 cm/s LVOT VTI:          0.205 m LVOT/AV VTI ratio: 0.81 AI PHT:            492 msec  AORTA Ao Root diam: 5.40 cm MITRAL VALVE               TRICUSPID VALVE MV Area (PHT): 4.06 cm    TR Peak grad:   43.3 mmHg MV Decel Time: 187 msec    TR Vmax:        329.00 cm/s MV E velocity: 73.20 cm/s MV A velocity: 42.70 cm/s  SHUNTS MV E/A ratio:  1.71        Systemic VTI:  0.20 m                            Systemic Diam: 2.30 cm Dani Gobble Croitoru MD Electronically signed by Sanda Klein MD Signature Date/Time: 01/01/2021/2:20:04 PM    Final         Scheduled Meds: . cholecalciferol  1,000 Units Oral Daily  . furosemide  40 mg Intravenous BID  . levothyroxine  88 mcg Oral QAC breakfast  . loratadine  10 mg Oral Daily  . metoprolol succinate  25 mg Oral Daily  . pantoprazole  40 mg Oral Daily  . sertraline  25 mg Oral BID  . simvastatin  10 mg Oral Daily  . warfarin  1.5 mg Oral Once per day on Sun Tue Thu Sat   And  . [START ON 01/03/2021]  warfarin  1 mg Oral Once per day on Mon Wed Fri  . Warfarin - Pharmacist Dosing Inpatient   Does not apply q1600   Continuous Infusions:   LOS: 2 days    Time spent: 38 minutes spent on chart review, discussion with nursing staff, consultants, updating family and interview/physical exam; more than 50% of that time was spent in counseling and/or coordination of care.    Ransome Helwig J [place] DO [facility] Available via Epic secure chat 7am-7pm After these hours, please refer to coverage provider listed on [email or web address] 01/02/2021, 12:17 PM

## 2021-01-02 NOTE — Progress Notes (Signed)
ANTICOAGULATION CONSULT NOTE - Follow Up Consult  Pharmacy Consult for Warfarin Indication: atrial fibrillation  Allergies  Allergen Reactions  . Penicillins     Unknown    Patient Measurements: Height: 5' (152.4 cm) Weight: 67.9 kg (149 lb 11.1 oz) IBW/kg (Calculated) : 50  Vital Signs: Temp: 98.6 F (37 C) (05/26 0556) Temp Source: Oral (05/26 0556) BP: 139/84 (05/26 0857) Pulse Rate: 84 (05/26 0640)  Labs: Recent Labs    12/31/20 1630 12/31/20 1646 12/31/20 1846 01/01/21 0213 01/02/21 0301  HGB 11.8*  --   --  10.8*  --   HCT 37.1*  --   --  33.7*  --   PLT 125*  --   --  115*  --   LABPROT 24.2*  --   --  24.4* 24.6*  INR 2.2*  --   --  2.2* 2.2*  CREATININE 2.55*  --   --  2.26* 2.08*  TROPONINIHS  --  104* 107*  --   --     Estimated Creatinine Clearance: 16 mL/min (A) (by C-G formula based on SCr of 2.08 mg/dL (H)).  Assessment:  87 yr old male admitted 5/24 after syncope, fall and LOC. Noted to have hit the back of his head and has scalp laceration, stapled.  No bleeding per CT head.   Continues on Warfarin as PTA for atrial fibrillation.      INR remains therapeutic, 2.2 x 3 days.  Thrombocytopenia. No bleeding noted.    PTA warfarin regimen per facility MAR:  1 mg MWF, 1.5 mg TTSS (9 mg weekly), since 11/09/20.  Previously on 1.25 mg daily (8.75 mg weekly).  Goal of Therapy:  INR 2-3 Monitor platelets by anticoagulation protocol: Yes   Plan:  Continue PTA warfarin regimen: 1.5 mg TTSS, 1 mg MWF. Daily PT/INR for now. Will decrease PT/INR frequency if am INR remains stable. Monitor for s/sx bleeding.  Arty Baumgartner, RPh 01/02/2021,10:52 AM

## 2021-01-02 NOTE — TOC Progression Note (Signed)
Transition of Care [facility]) - Progression Note    Patient Details  Name: TARO HIDROGO MRN: [id number] Date of Birth: 1920-03-07  Transition of Care [facility]) CM/SW [place], [place] Phone Number: 01/02/2021, 12:24 PM  Clinical Narrative:    1223: CSW spoke with Rollene Fare at [facility] to provide update on pt pt HH needs, she suggested calling back at DC and she will reach out to her team. Pt is from [facility], [place] will follow up.        Expected Discharge Plan and Services                                                 Social Determinants of Health (SDOH) Interventions    Readmission Risk Interventions No flowsheet data found.

## 2021-01-02 NOTE — Progress Notes (Signed)
Heart Failure Navigator Progress Note  Assessed for Heart & Vascular TOC clinic readiness.  Unfortunately at this time the patient does not meet criteria due to advanced CKD.   Navigator available for reassessment of patient.   [name], PharmD, BCPS Heart Failure Stewardship Pharmacist Phone [phone]   

## 2021-01-02 NOTE — Progress Notes (Signed)
Physical Therapy Treatment Patient Details Name: [name] MRN: [id number] DOB: 02/26/1920 Today's Date: 01/02/2021    History of Present Illness Pt is a 87 y.o. male admitted from ALF on 12/31/20 with syncopal episode (+ LOC), fall and hitting head; pt reports similar episode 2 months prior, as well as worsening DOE and BLE edema. Workup revealed R posterior head lac post-staples, hypoxia, pulmonary edema, AKI. Head CT negative for acute injury. PMH includes afib on coumadin, HF, CKD IV.   PT Comments    Pt progressing with mobility. Today's session focused on transfer and gait training with rollator, pt moving well with intermittent min guard for balance. Pt with nausea/vomiting upon return to room, denies headache, reports "I think I ate too much breakfast" (RN aware); deferred further mobility secondary to this. Pt pleasant and motivated to participate. Continue to recommend HHPT services at ALF; pt will likely need to d/c home with oxygen (see values below).  SpO2 down to 81% on RA with ambulation SpO2 >/94% on 2L O2 at rest Post-ambulation BP 139/84, HR 116    Follow Up Recommendations  Home health PT;Supervision - Intermittent     Equipment Recommendations  None recommended by PT    Recommendations for Other Services       Precautions / Restrictions Precautions Precautions: Fall;Other (comment) Precaution Comments: Watch SpO2 Restrictions Weight Bearing Restrictions: No    Mobility  Bed Mobility               General bed mobility comments: received sitting EOB    Transfers Overall transfer level: Needs assistance Equipment used: 4-wheeled walker Transfers: Sit to/from Stand Sit to Stand: Min guard;From elevated surface         General transfer comment: 2x sit<>stand from slightly elevated bed height, pt with good awareness to lock rollator brakes, min guard for balance; additional 2x sit<>stands from recliner, reliant on UE support to push into  standing, min guard  Ambulation/Gait Ambulation/Gait assistance: Min guard;Supervision Gait Distance (Feet): 88 Feet Assistive device: 4-wheeled walker Gait Pattern/deviations: Step-through pattern;Decreased stride length;Trunk flexed Gait velocity: Decreased   General Gait Details: Slow, steady gait with rollator and intermittent min guard for balance; pt with good awareness of activity tolerance; DOE 2/4, SpO2 down to 81% on RA   Stairs             Wheelchair Mobility    Modified Rankin (Stroke Patients Only)       Balance Overall balance assessment: Needs assistance Sitting-balance support: Feet supported;No upper extremity supported Sitting balance-Leahy Scale: Good     Standing balance support: Bilateral upper extremity supported;During functional activity;No upper extremity supported Standing balance-Leahy Scale: Fair Standing balance comment: Can static stand without UE support; improved static/dynamic stability with UE support                            Cognition Arousal/Alertness: Awake/alert Behavior During Therapy: WFL for tasks assessed/performed Overall Cognitive Status: Within Functional Limits for tasks assessed                                 General Comments: WFL for simple tasks; requires some repetition due to [place] Comments General comments (skin integrity, edema, etc.): Pt denies dizziness with mobility. SpO2 down to 81% on RA with ambulation, up to >/94% on 2L  with seated rest. Pt with nausea/vomiting after hallway ambulation, BP 162/106 and HR 116; BP down to 139/84 after seated rest; pt denies headache, reports "I think I ate too much breakfast." RN present and aware      Pertinent Vitals/Pain Pain Assessment: No/denies pain    Home Living                      Prior Function            PT Goals (current goals can now be found in the care plan section) Progress towards PT  goals: Progressing toward goals    Frequency    Min 3X/week      PT Plan Current plan remains appropriate    Co-evaluation              AM-PAC PT "6 Clicks" Mobility   Outcome Measure  Help needed turning from your back to your side while in a flat bed without using bedrails?: None Help needed moving from lying on your back to sitting on the side of a flat bed without using bedrails?: None Help needed moving to and from a bed to a chair (including a wheelchair)?: A Little Help needed standing up from a chair using your arms (e.g., wheelchair or bedside chair)?: A Little Help needed to walk in hospital room?: A Little Help needed climbing 3-5 steps with a railing? : A Lot 6 Click Score: 19    End of Session Equipment Utilized During Treatment: Gait belt;Oxygen Activity Tolerance: Patient tolerated treatment well;Other (comment) (limited by nausea/vomiting) Patient left: in chair;with call bell/phone within reach;with chair alarm set;with nursing/sitter in room Nurse Communication: Mobility status PT Visit Diagnosis: Other abnormalities of gait and mobility (R26.89);Muscle weakness (generalized) (M62.81)     Time: 7482-7078 PT Time Calculation (min) (ACUTE ONLY): 25 min  Charges:  $Gait Training: 8-22 mins $Therapeutic Activity: 8-22 mins                     Mabeline Caras, PT, DPT Acute Rehabilitation Services  Pager [phone] Office [place] 01/02/2021, 9:46 AM

## 2021-01-02 NOTE — Progress Notes (Signed)
Progress Note  Patient Name: [name] Date of Encounter: 01/02/2021  [facility] Cardiologist: Dr. Johnsie Cancel  Subjective   No acute overnight events. Patient still feels short of breath and still feels like he needs O2. Not on O2 at home. No chest pain. No palpitations.  Inpatient Medications    Scheduled Meds: . cholecalciferol  1,000 Units Oral Daily  . furosemide  40 mg Intravenous BID  . levothyroxine  88 mcg Oral QAC breakfast  . loratadine  10 mg Oral Daily  . metoprolol succinate  25 mg Oral Daily  . pantoprazole  40 mg Oral Daily  . sertraline  25 mg Oral BID  . simvastatin  10 mg Oral Daily  . Warfarin - Pharmacist Dosing Inpatient   Does not apply q1600   Continuous Infusions:  PRN Meds: diazepam   Vital Signs    Vitals:   01/02/21 0000 01/02/21 0556 01/02/21 0640 01/02/21 0857  BP: (!) 96/50  114/71 139/84  Pulse: 79  84   Resp: 20  19 18   Temp: 97.6 F (36.4 C) 98.6 F (37 C)    TempSrc: Oral Oral    SpO2: 96%  97% 93%  Weight:      Height:        Intake/Output Summary (Last 24 hours) at 01/02/2021 0906 Last data filed at 01/02/2021 0841 Gross per 24 hour  Intake 720 ml  Output 1250 ml  Net -530 ml   Last 3 Weights 01/01/2021 12/31/2020  Weight (lbs) 149 lb 11.1 oz 150 lb  Weight (kg) 67.9 kg 68.04 kg      Telemetry    Atrial fibrillation with rates in the 70s to 90s. Rates in the 110s earlier this morning but I think this was when he was ambulating with PT - Personally Reviewed  ECG    No new ECG tracing today. - Personally Reviewed  Physical Exam   GEN: No acute distress.   Neck: JVD elevated. Cardiac: Irregularly irregular rhythm with normal rates. No murmurs, rubs, or gallops.  Respiratory: No increased work of breathing. Mild crackles in bases (right > left). GI: Soft, non-distended, and non-tender.  MS: 2-3+ pitting edema on the left lower extremity and 1+ on the right. No deformity. Skin: Warm and dry. Neuro:  No  focal deficits. Psych: Normal affect. Responds appropriately.  Labs    High Sensitivity Troponin:   Recent Labs  Lab 12/31/20 1646 12/31/20 1846  TROPONINIHS 104* 107*      Chemistry Recent Labs  Lab 12/31/20 1630 01/01/21 0213 01/02/21 0301  NA 135 136 136  K 4.5 4.3 4.0  CL 97* 101 102  CO2 27 27 26   GLUCOSE 146* 112* 100*  BUN 48* 47* 46*  CREATININE 2.55* 2.26* 2.08*  CALCIUM 8.9 8.6* 8.3*  PROT 6.6  --   --   ALBUMIN 3.6  --   --   AST 29  --   --   ALT 23  --   --   ALKPHOS 82  --   --   BILITOT 1.0  --   --   GFRNONAA 22* 26* 28*  ANIONGAP 11 8 8      Hematology Recent Labs  Lab 12/31/20 1630 01/01/21 0213  WBC 7.8 7.7  RBC 3.86* 3.59*  HGB 11.8* 10.8*  HCT 37.1* 33.7*  MCV 96.1 93.9  MCH 30.6 30.1  MCHC 31.8 32.0  RDW 17.4* 17.2*  PLT 125* 115*    BNP Recent Labs  Lab 12/31/20 1646  BNP 749.5*     DDimer No results for input(s): DDIMER in the last 168 hours.   Radiology    CT Head Wo Contrast  Result Date: 12/31/2020 CLINICAL DATA:  87 year old male with head trauma. EXAM: CT HEAD WITHOUT CONTRAST CT CERVICAL SPINE WITHOUT CONTRAST TECHNIQUE: Multidetector CT imaging of the head and cervical spine was performed following the standard protocol without intravenous contrast. Multiplanar CT image reconstructions of the cervical spine were also generated. COMPARISON:  None. FINDINGS: CT HEAD FINDINGS Brain: Mild age-related atrophy and chronic microvascular ischemic changes. There is no acute intracranial hemorrhage. No mass effect or midline shift. No extra-axial fluid collection. Vascular: No hyperdense vessel or unexpected calcification. Skull: Normal. Negative for fracture or focal lesion. Sinuses/Orbits: No acute finding. Other: Right posterior parietal scalp laceration. No large hematoma. CT CERVICAL SPINE FINDINGS Alignment: No acute subluxation. Skull base and vertebrae: No acute fracture. Osteopenia. Soft tissues and spinal canal: No  prevertebral fluid or swelling. No visible canal hematoma. Disc levels: Multilevel degenerative changes with disc space narrowing and endplate irregularity. Upper chest: Partially visualized bilateral pleural effusions. There is diffuse interstitial and interlobular thickening, likely edema. Clinical correlation is recommended. Other: Bilateral carotid bulb calcified plaques. IMPRESSION: 1. No acute intracranial pathology. Age-related atrophy and chronic microvascular ischemic changes. 2. No acute/traumatic cervical spine pathology. Multilevel degenerative changes. 3. Partially visualized bilateral pleural effusions and findings of pulmonary edema. Clinical correlation is recommended. Electronically Signed   By: Anner Crete M.D.   On: 12/31/2020 17:14   CT Cervical Spine Wo Contrast  Result Date: 12/31/2020 CLINICAL DATA:  87 year old male with head trauma. EXAM: CT HEAD WITHOUT CONTRAST CT CERVICAL SPINE WITHOUT CONTRAST TECHNIQUE: Multidetector CT imaging of the head and cervical spine was performed following the standard protocol without intravenous contrast. Multiplanar CT image reconstructions of the cervical spine were also generated. COMPARISON:  None. FINDINGS: CT HEAD FINDINGS Brain: Mild age-related atrophy and chronic microvascular ischemic changes. There is no acute intracranial hemorrhage. No mass effect or midline shift. No extra-axial fluid collection. Vascular: No hyperdense vessel or unexpected calcification. Skull: Normal. Negative for fracture or focal lesion. Sinuses/Orbits: No acute finding. Other: Right posterior parietal scalp laceration. No large hematoma. CT CERVICAL SPINE FINDINGS Alignment: No acute subluxation. Skull base and vertebrae: No acute fracture. Osteopenia. Soft tissues and spinal canal: No prevertebral fluid or swelling. No visible canal hematoma. Disc levels: Multilevel degenerative changes with disc space narrowing and endplate irregularity. Upper chest: Partially  visualized bilateral pleural effusions. There is diffuse interstitial and interlobular thickening, likely edema. Clinical correlation is recommended. Other: Bilateral carotid bulb calcified plaques. IMPRESSION: 1. No acute intracranial pathology. Age-related atrophy and chronic microvascular ischemic changes. 2. No acute/traumatic cervical spine pathology. Multilevel degenerative changes. 3. Partially visualized bilateral pleural effusions and findings of pulmonary edema. Clinical correlation is recommended. Electronically Signed   By: Anner Crete M.D.   On: 12/31/2020 17:14   DG Pelvis Portable  Result Date: 12/31/2020 CLINICAL DATA:  Fall. EXAM: PORTABLE PELVIS 1-2 VIEWS COMPARISON:  None. FINDINGS: There is no evidence of pelvic fracture or diastasis. No pelvic bone lesions are seen. IMPRESSION: Negative. Electronically Signed   By: Marijo Conception M.D.   On: 12/31/2020 16:48   DG Chest Port 1 View  Result Date: 12/31/2020 CLINICAL DATA:  87 year old male with fall. EXAM: PORTABLE CHEST 1 VIEW COMPARISON:  Chest radiograph dated 04/17/2020. FINDINGS: Shallow inspiration. Bilateral interstitial coarsening and hazy airspace densities primarily involving the mid to  lower lung field and subpleural region of the lungs, progressed since the prior radiograph and may represent progression of underlying fibrosis or sequela of prior inflammatory/infectious process including prior COVID-19 infection. Clinical correlation is recommended. There is no pleural effusion pneumothorax. Stable cardiac silhouette. Median sternotomy wires and CABG vascular clips. No acute osseous pathology. IMPRESSION: Progression of bilateral pulmonary densities, likely worsening fibrosis or sequela of prior inflammatory/infectious process. Electronically Signed   By: Anner Crete M.D.   On: 12/31/2020 16:48   ECHOCARDIOGRAM COMPLETE  Result Date: 01/01/2021    ECHOCARDIOGRAM REPORT   Patient Name:   [name] Date of  Exam: 01/01/2021 Medical Rec #:  [id number]         Height:       60.0 in Accession #:    [id number]        Weight:       149.7 lb Date of Birth:  October 31, 1919         BSA:          1.650 m Patient Age:    47 years          BP:           117/70 mmHg Patient Gender: M                 HR:           99 bpm. Exam Location:  Inpatient Procedure: 2D Echo, Cardiac Doppler and Color Doppler Indications:    CHF  History:        Patient has no prior history of Echocardiogram examinations.                 CHF, CAD and LA myxoma, Prior CABG, Aortic Valve Disease,                 Arrythmias:Atrial Fibrillation, Signs/Symptoms:Shortness of                 Breath; Risk Factors:Hypertension. CKD.  Sonographer:    Dustin Flock Referring Phys: [id number] [place]  1. There appears to be global left ventricular mild hypokinesis, but there is moderate inferolateral wall hypokinesis. Left ventricular ejection fraction, by estimation, is 40 to 45%. The left ventricle has mildly decreased function. The left ventricle demonstrates regional wall motion abnormalities (see scoring diagram/findings for description). There is mild concentric left ventricular hypertrophy. Left ventricular diastolic function could not be evaluated.  2. Right ventricular systolic function is moderately reduced. The right ventricular size is mildly enlarged. There is moderately elevated pulmonary artery systolic pressure.  3. No left atrial mass is seen. Left atrial size was severely dilated.  4. Right atrial size was moderately dilated.  5. The mitral valve is degenerative. Mild mitral valve regurgitation. No evidence of mitral stenosis.  6. Tricuspid valve regurgitation is mild to moderate.  7. The aortic valve is tricuspid. There is mild calcification of the aortic valve. There is moderate thickening of the aortic valve. Aortic valve regurgitation is mild to moderate. Mild to moderate aortic valve sclerosis/calcification is present, without any  evidence of aortic stenosis. Comparison(s): Prior images unable to be directly viewed, comparison made by report only. The left ventricular function is worsened. The right ventricular hypertrophy is worse. FINDINGS  Left Ventricle: There appears to be global left ventricular mild hypokinesis, but there is moderate inferolateral wall hypokinesis. Left ventricular ejection fraction, by estimation, is 40 to 45%. The left ventricle has mildly decreased function. The left ventricle demonstrates  regional wall motion abnormalities. The left ventricular internal cavity size was normal in size. There is mild concentric left ventricular hypertrophy. Abnormal (paradoxical) septal motion consistent with post-operative status. Left ventricular diastolic function could not be evaluated due to atrial fibrillation. Left ventricular diastolic function could not be evaluated. Right Ventricle: The right ventricular size is mildly enlarged. No increase in right ventricular wall thickness. Right ventricular systolic function is moderately reduced. There is moderately elevated pulmonary artery systolic pressure. The tricuspid regurgitant velocity is 3.29 m/s, and with an assumed right atrial pressure of 5 mmHg, the estimated right ventricular systolic pressure is 89.2 mmHg. Left Atrium: No left atrial mass is seen. Left atrial size was severely dilated. Right Atrium: Right atrial size was moderately dilated. Pericardium: There is no evidence of pericardial effusion. Mitral Valve: The mitral valve is degenerative in appearance. There is mild thickening of the mitral valve leaflet(s). Mild to moderate mitral annular calcification. Mild mitral valve regurgitation, with centrally-directed jet. No evidence of mitral valve stenosis. Tricuspid Valve: The tricuspid valve is normal in structure. Tricuspid valve regurgitation is mild to moderate. Aortic Valve: The aortic valve is tricuspid. There is mild calcification of the aortic valve. There is  moderate thickening of the aortic valve. Aortic valve regurgitation is mild to moderate. Aortic regurgitation PHT measures 492 msec. Mild to moderate aortic valve sclerosis/calcification is present, without any evidence of aortic stenosis. Aortic valve mean gradient measures 4.0 mmHg. Aortic valve peak gradient measures 8.2 mmHg. Aortic valve area, by VTI measures 3.35 cm. Pulmonic Valve: The pulmonic valve was not well visualized. Pulmonic valve regurgitation is not visualized. Aorta: The aortic root is normal in size and structure and the ascending aorta was not well visualized. Venous: The inferior vena cava was not well visualized. IAS/Shunts: No atrial level shunt detected by color flow Doppler. There is no evidence of an atrial septal defect.  LEFT VENTRICLE PLAX 2D LVIDd:         4.60 cm  Diastology LVIDs:         3.20 cm  LV e' medial:    2.51 cm/s LV PW:         1.30 cm  LV E/e' medial:  29.2 LV IVS:        1.20 cm  LV e' lateral:   3.16 cm/s LVOT diam:     2.30 cm  LV E/e' lateral: 23.2 LV SV:         85 LV SV Index:   52 LVOT Area:     4.15 cm  RIGHT VENTRICLE RV Basal diam:  4.40 cm RV S prime:     6.24 cm/s TAPSE (M-mode): 1.6 cm LEFT ATRIUM             Index       RIGHT ATRIUM           Index LA diam:        5.00 cm 3.03 cm/m  RA Area:     24.30 cm LA Vol (A2C):   52.9 ml 32.05 ml/m RA Volume:   77.00 ml  46.66 ml/m LA Vol (A4C):   66.0 ml 39.99 ml/m LA Biplane Vol: 61.6 ml 37.33 ml/m  AORTIC VALVE AV Area (Vmax):    3.13 cm AV Area (Vmean):   3.27 cm AV Area (VTI):     3.35 cm AV Vmax:           143.50 cm/s AV Vmean:  93.100 cm/s AV VTI:            0.254 m AV Peak Grad:      8.2 mmHg AV Mean Grad:      4.0 mmHg LVOT Vmax:         108.00 cm/s LVOT Vmean:        73.200 cm/s LVOT VTI:          0.205 m LVOT/AV VTI ratio: 0.81 AI PHT:            492 msec  AORTA Ao Root diam: 5.40 cm MITRAL VALVE               TRICUSPID VALVE MV Area (PHT): 4.06 cm    TR Peak grad:   43.3 mmHg MV Decel  Time: 187 msec    TR Vmax:        329.00 cm/s MV E velocity: 73.20 cm/s MV A velocity: 42.70 cm/s  SHUNTS MV E/A ratio:  1.71        Systemic VTI:  0.20 m                            Systemic Diam: 2.30 cm Sanda Klein MD Electronically signed by Sanda Klein MD Signature Date/Time: 01/01/2021/2:20:04 PM    Final     Cardiac Studies   Echocardiogram 01/01/2021: Impressions: 1. There appears to be global left ventricular mild hypokinesis, but  there is moderate inferolateral wall hypokinesis. Left ventricular  ejection fraction, by estimation, is 40 to 45%. The left ventricle has  mildly decreased function. The left ventricle  demonstrates regional wall motion abnormalities (see scoring  diagram/findings for description). There is mild concentric left  ventricular hypertrophy. Left ventricular diastolic function could not be  evaluated.  2. Right ventricular systolic function is moderately reduced. The right  ventricular size is mildly enlarged. There is moderately elevated  pulmonary artery systolic pressure.  3. No left atrial mass is seen. Left atrial size was severely dilated.  4. Right atrial size was moderately dilated.  5. The mitral valve is degenerative. Mild mitral valve regurgitation. No  evidence of mitral stenosis.  6. Tricuspid valve regurgitation is mild to moderate.  7. The aortic valve is tricuspid. There is mild calcification of the  aortic valve. There is moderate thickening of the aortic valve. Aortic  valve regurgitation is mild to moderate. Mild to moderate aortic valve  sclerosis/calcification is present,  without any evidence of aortic stenosis.   Comparison(s): Prior images unable to be directly viewed, comparison made  by report only. The left ventricular function is worsened. The right  ventricular hypertrophy is worse.   Patient Profile     87 y.o. male with a history of CAD s/p CABG in 2005 and redo CABG in 2008 at time of left atrial myxoma  resection, permanent atrial fibrillation on Coumadin, chronic diastolic CHF, interstitial lung idsease, hypertension, and CKD stage IV who is being seen for evaluation of acute on chronic diastolic CHF at the request of Dr. Darl Householder.  Assessment & Plan    Acute on Chronic Diastolic CHF - Likely triggered by recent decrease in Lasix dosing. - BNP elevated in the 700s. - Chest x-ray showed progression of bilateral pulmonary densities which may represent likely worsening fibrosis or sequela of prior inflammatory/infectious process. - Echo showed LVEF of 40-45% (down from 50-55% in 2016) with hypokinesis of inferolateral wall. RV mildly enlarged with moderately reduced systolic function and  moderately elevated PASP. Also showed mil MR, mild to moderate TR, and mild to moderate AI. - Still has significant lower extremity edema (left > right but patient states this is chronic). Patient on Coumadin so low suspicion for DVT. Suspect due to chronic venous insufficiency given SVG grafts were harvested from this leg. - Started on IV Lasix 40mg  twice daily. Documented urinary output of 1.25 L yesterday and net negative 1.17 L since admission. No updated weight today. Renal function stable. - Continue current dose of IV Lasix. - Continue Toprol-XL 25mg  daily.  - No ACE/ARB/ARNI due to renal function. - Continue to monitor daily weights, strict I/O's, and renal function.   Possible Syncope vs Mechanical Fall with Head Strike - Patient fell when trying to plug in his iPAD. Thought that he may have "blacked out" that led to his fall. No prodromal symptoms. - Head CT negative.  - EKG showed no acute ischemic changes.  - Echo as above. - No concerning arrhythmias noted on telemetry. He has chronic atrial fibrillation but rate controlled. No concerning ventricular arrhythmias or high-grade AV block. - Will discuss with MD about whether or not outpatient monitor is needed.  CAD s/p CABG in 2005 and Redo in 2008 -  High-sensitivity troponin mildly elevated and flat at 104 >> 107. Not consistent with ACS. Likely due to acute CHF. - No anginal symptoms.  - No aspirin given need for Coumadin. - Not on statin at home. Given age does not need to be initiated at this time. - Echo did show slight decrease in EF as well as new wall motion abnormality. However, he is not good candidate for cardiac catheterization given advanced age and CKD. Patient also would prefer not to have any invasive work-up. Will treat medically.  Permanent Atrial Fibrillation - Mostly rate controlled. - Continue low dose Toprol-XL as above.  - CHAD2DS2-VASc = 5. Continue chronic anticoagulation with Coumadin. Dosing per Pharmacy.  CKD Stage IV - Creatinine 2.55 on admission and has been trending down. 2.08 today.  - Baseline around 2.2 to 2.5. - Continue to monitor closely with diuresis.  Ascending Aorta Dilatation - Echo in 2016 showed ascending aorta of 54mm. Echo this admission showed normal aortic root but the ascending aorta was not well visualized. - Given advanced age and comorbid illnesses, he would not be a candidate for surgical repair.    For questions or updates, please contact [place] Please consult [email or web address] for contact info under        Signed, Darreld Mclean, PA-C  01/02/2021, 9:06 AM

## 2021-01-03 DIAGNOSIS — I5021 Acute systolic (congestive) heart failure: Secondary | ICD-10-CM

## 2021-01-03 DIAGNOSIS — J9601 Acute respiratory failure with hypoxia: Secondary | ICD-10-CM | POA: Diagnosis not present

## 2021-01-03 DIAGNOSIS — I4819 Other persistent atrial fibrillation: Secondary | ICD-10-CM | POA: Diagnosis not present

## 2021-01-03 DIAGNOSIS — N170 Acute kidney failure with tubular necrosis: Secondary | ICD-10-CM | POA: Diagnosis not present

## 2021-01-03 DIAGNOSIS — N183 Chronic kidney disease, stage 3 unspecified: Secondary | ICD-10-CM

## 2021-01-03 DIAGNOSIS — E785 Hyperlipidemia, unspecified: Secondary | ICD-10-CM | POA: Diagnosis not present

## 2021-01-03 DIAGNOSIS — I5041 Acute combined systolic (congestive) and diastolic (congestive) heart failure: Secondary | ICD-10-CM | POA: Diagnosis not present

## 2021-01-03 DIAGNOSIS — N179 Acute kidney failure, unspecified: Secondary | ICD-10-CM | POA: Diagnosis not present

## 2021-01-03 LAB — TSH: TSH: 5.933 u[IU]/mL — ABNORMAL HIGH (ref 0.350–4.500)

## 2021-01-03 LAB — BASIC METABOLIC PANEL
Anion gap: 6 (ref 5–15)
BUN: 46 mg/dL — ABNORMAL HIGH (ref 8–23)
CO2: 30 mmol/L (ref 22–32)
Calcium: 8.1 mg/dL — ABNORMAL LOW (ref 8.9–10.3)
Chloride: 99 mmol/L (ref 98–111)
Creatinine, Ser: 2.01 mg/dL — ABNORMAL HIGH (ref 0.61–1.24)
GFR, Estimated: 29 mL/min — ABNORMAL LOW (ref >60)
Glucose, Bld: 93 mg/dL (ref 70–99)
Potassium: 3.6 mmol/L (ref 3.5–5.1)
Sodium: 135 mmol/L (ref 135–145)

## 2021-01-03 LAB — PROTIME-INR
INR: 2.6 — ABNORMAL HIGH (ref 0.8–1.2)
Prothrombin Time: 27.7 seconds — ABNORMAL HIGH (ref 11.4–15.2)

## 2021-01-03 MED ORDER — POLYETHYLENE GLYCOL 3350 17 G PO PACK
17.0000 g | PACK | Freq: Once | ORAL | Status: AC
  Administered 2021-01-03: 17 g via ORAL
  Filled 2021-01-03: qty 1

## 2021-01-03 MED ORDER — ORAL CARE MOUTH RINSE
15.0000 mL | Freq: Two times a day (BID) | OROMUCOSAL | Status: DC
  Administered 2021-01-03 – 2021-01-08 (×8): 15 mL via OROMUCOSAL

## 2021-01-03 MED ORDER — ACETAMINOPHEN 325 MG PO TABS
650.0000 mg | ORAL_TABLET | Freq: Four times a day (QID) | ORAL | Status: DC | PRN
  Administered 2021-01-03 – 2021-01-07 (×4): 650 mg via ORAL
  Filled 2021-01-03 (×4): qty 2

## 2021-01-03 NOTE — TOC Progression Note (Addendum)
Transition of Care [facility]) - Progression Note    Patient Details  Name: [name] MRN: [id number] Date of Birth: 11-Dec-1919  Transition of [facility] CM/SW [place], [place] Phone Number: 01/03/2021, 10:22 AM  Clinical Narrative:    1003: [facility] on pt DC status and plan for DC, she can be reached at [phone] and provided the number for the nurses desk for weekend follow ups [phone]). CSW will follow up. [employer] was contacted for possible Home o2 needs.        Expected Discharge Plan and Services                                                 Social Determinants of Health (SDOH) Interventions    Readmission Risk Interventions No flowsheet data found.

## 2021-01-03 NOTE — Progress Notes (Signed)
Progress Note  Patient Name: [name] Date of Encounter: 01/03/2021  Primary Cardiologist: Freada Bergeron, MD  Subjective   Felt a little SOB earlier but not presently. Leg edema has improved. States they haven't fully gone down since he had prior surgeries.  Inpatient Medications    Scheduled Meds: . cholecalciferol  1,000 Units Oral Daily  . furosemide  40 mg Intravenous BID  . levothyroxine  88 mcg Oral QAC breakfast  . loratadine  10 mg Oral Daily  . metoprolol succinate  25 mg Oral Daily  . pantoprazole  40 mg Oral Daily  . sertraline  25 mg Oral BID  . simvastatin  10 mg Oral Daily  . warfarin  1.5 mg Oral Once per day on Sun Tue Thu Sat   And  . warfarin  1 mg Oral Once per day on Mon Wed Fri  . Warfarin - Pharmacist Dosing Inpatient   Does not apply q1600   Continuous Infusions:  PRN Meds: diazepam   Vital Signs    Vitals:   01/02/21 1953 01/03/21 0528 01/03/21 0826 01/03/21 0842  BP: 115/83 105/66  109/67  Pulse: 77 81  87  Resp: 18 20    Temp: 97.7 F (36.5 C) 98.2 F (36.8 C) 98.3 F (36.8 C)   TempSrc: Oral Oral Oral   SpO2: 100% 99%    Weight:  67.8 kg    Height:        Intake/Output Summary (Last 24 hours) at 01/03/2021 0901 Last data filed at 01/03/2021 0530 Gross per 24 hour  Intake 840 ml  Output 1650 ml  Net -810 ml   Last 3 Weights 01/03/2021 01/01/2021 12/31/2020  Weight (lbs) 149 lb 7.6 oz 149 lb 11.1 oz 150 lb  Weight (kg) 67.8 kg 67.9 kg 68.04 kg     Telemetry    Atrial fib rate controlled - Personally Reviewed  Physical Exam   GEN: No acute distress.  HEENT: Normocephalic, atraumatic, sclera non-icteric. Neck: No JVD or bruits. Cardiac: irregularly irregular, rate controlled, no murmurs, rubs, or gallops.  Respiratory: Bilateral crackles. No wheezing or rhonchi. Breathing is unlabored. GI: Soft, nontender, non-distended, BS +x 4. MS: no deformity. Extremities: No clubbing or cyanosis. 1+ BLE edema. Distal  pedal pulses are 2+ and equal bilaterally. Neuro:  AAOx3. Follows commands. Psych:  Responds to questions appropriately with a normal affect.  Labs    High Sensitivity Troponin:   Recent Labs  Lab 12/31/20 1646 12/31/20 1846  TROPONINIHS 104* 107*      Cardiac EnzymesNo results for input(s): TROPONINI in the last 168 hours. No results for input(s): TROPIPOC in the last 168 hours.   Chemistry Recent Labs  Lab 12/31/20 1630 01/01/21 0213 01/02/21 0301 01/03/21 0348  NA 135 136 136 135  K 4.5 4.3 4.0 3.6  CL 97* 101 102 99  CO2 27 27 26 30   GLUCOSE 146* 112* 100* 93  BUN 48* 47* 46* 46*  CREATININE 2.55* 2.26* 2.08* 2.01*  CALCIUM 8.9 8.6* 8.3* 8.1*  PROT 6.6  --   --   --   ALBUMIN 3.6  --   --   --   AST 29  --   --   --   ALT 23  --   --   --   ALKPHOS 82  --   --   --   BILITOT 1.0  --   --   --   GFRNONAA 22* 26* 28* 29*  ANIONGAP 11 8 8 6      Hematology Recent Labs  Lab 12/31/20 1630 01/01/21 0213  WBC 7.8 7.7  RBC 3.86* 3.59*  HGB 11.8* 10.8*  HCT 37.1* 33.7*  MCV 96.1 93.9  MCH 30.6 30.1  MCHC 31.8 32.0  RDW 17.4* 17.2*  PLT 125* 115*    BNP Recent Labs  Lab 12/31/20 1646  BNP 749.5*     DDimer No results for input(s): DDIMER in the last 168 hours.   Radiology    ECHOCARDIOGRAM COMPLETE  Result Date: 01/01/2021    ECHOCARDIOGRAM REPORT   Patient Name:   AWESOME JARED Date of Exam: 01/01/2021 Medical Rec #:  [id number]         Height:       60.0 in Accession #:    [id number]        Weight:       149.7 lb Date of Birth:  [birth year]         BSA:          1.650 m Patient Age:    37 years          BP:           117/70 mmHg Patient Gender: M                 HR:           99 bpm. Exam Location:  Inpatient Procedure: 2D Echo, Cardiac Doppler and Color Doppler Indications:    CHF  History:        Patient has no prior history of Echocardiogram examinations.                 CHF, CAD and LA myxoma, Prior CABG, Aortic Valve Disease,                  Arrythmias:Atrial Fibrillation, Signs/Symptoms:Shortness of                 Breath; Risk Factors:Hypertension. CKD.  Sonographer:    Dustin Flock Referring Phys: [id number] [place]  1. There appears to be global left ventricular mild hypokinesis, but there is moderate inferolateral wall hypokinesis. Left ventricular ejection fraction, by estimation, is 40 to 45%. The left ventricle has mildly decreased function. The left ventricle demonstrates regional wall motion abnormalities (see scoring diagram/findings for description). There is mild concentric left ventricular hypertrophy. Left ventricular diastolic function could not be evaluated.  2. Right ventricular systolic function is moderately reduced. The right ventricular size is mildly enlarged. There is moderately elevated pulmonary artery systolic pressure.  3. No left atrial mass is seen. Left atrial size was severely dilated.  4. Right atrial size was moderately dilated.  5. The mitral valve is degenerative. Mild mitral valve regurgitation. No evidence of mitral stenosis.  6. Tricuspid valve regurgitation is mild to moderate.  7. The aortic valve is tricuspid. There is mild calcification of the aortic valve. There is moderate thickening of the aortic valve. Aortic valve regurgitation is mild to moderate. Mild to moderate aortic valve sclerosis/calcification is present, without any evidence of aortic stenosis. Comparison(s): Prior images unable to be directly viewed, comparison made by report only. The left ventricular function is worsened. The right ventricular hypertrophy is worse. FINDINGS  Left Ventricle: There appears to be global left ventricular mild hypokinesis, but there is moderate inferolateral wall hypokinesis. Left ventricular ejection fraction, by estimation, is 40 to 45%. The left ventricle has mildly decreased function. The left ventricle  demonstrates regional wall motion abnormalities. The left ventricular internal cavity size  was normal in size. There is mild concentric left ventricular hypertrophy. Abnormal (paradoxical) septal motion consistent with post-operative status. Left ventricular diastolic function could not be evaluated due to atrial fibrillation. Left ventricular diastolic function could not be evaluated. Right Ventricle: The right ventricular size is mildly enlarged. No increase in right ventricular wall thickness. Right ventricular systolic function is moderately reduced. There is moderately elevated pulmonary artery systolic pressure. The tricuspid regurgitant velocity is 3.29 m/s, and with an assumed right atrial pressure of 5 mmHg, the estimated right ventricular systolic pressure is 44.0 mmHg. Left Atrium: No left atrial mass is seen. Left atrial size was severely dilated. Right Atrium: Right atrial size was moderately dilated. Pericardium: There is no evidence of pericardial effusion. Mitral Valve: The mitral valve is degenerative in appearance. There is mild thickening of the mitral valve leaflet(s). Mild to moderate mitral annular calcification. Mild mitral valve regurgitation, with centrally-directed jet. No evidence of mitral valve stenosis. Tricuspid Valve: The tricuspid valve is normal in structure. Tricuspid valve regurgitation is mild to moderate. Aortic Valve: The aortic valve is tricuspid. There is mild calcification of the aortic valve. There is moderate thickening of the aortic valve. Aortic valve regurgitation is mild to moderate. Aortic regurgitation PHT measures 492 msec. Mild to moderate aortic valve sclerosis/calcification is present, without any evidence of aortic stenosis. Aortic valve mean gradient measures 4.0 mmHg. Aortic valve peak gradient measures 8.2 mmHg. Aortic valve area, by VTI measures 3.35 cm. Pulmonic Valve: The pulmonic valve was not well visualized. Pulmonic valve regurgitation is not visualized. Aorta: The aortic root is normal in size and structure and the ascending aorta was not  well visualized. Venous: The inferior vena cava was not well visualized. IAS/Shunts: No atrial level shunt detected by color flow Doppler. There is no evidence of an atrial septal defect.  LEFT VENTRICLE PLAX 2D LVIDd:         4.60 cm  Diastology LVIDs:         3.20 cm  LV e' medial:    2.51 cm/s LV PW:         1.30 cm  LV E/e' medial:  29.2 LV IVS:        1.20 cm  LV e' lateral:   3.16 cm/s LVOT diam:     2.30 cm  LV E/e' lateral: 23.2 LV SV:         85 LV SV Index:   52 LVOT Area:     4.15 cm  RIGHT VENTRICLE RV Basal diam:  4.40 cm RV S prime:     6.24 cm/s TAPSE (M-mode): 1.6 cm LEFT ATRIUM             Index       RIGHT ATRIUM           Index LA diam:        5.00 cm 3.03 cm/m  RA Area:     24.30 cm LA Vol (A2C):   52.9 ml 32.05 ml/m RA Volume:   77.00 ml  46.66 ml/m LA Vol (A4C):   66.0 ml 39.99 ml/m LA Biplane Vol: 61.6 ml 37.33 ml/m  AORTIC VALVE AV Area (Vmax):    3.13 cm AV Area (Vmean):   3.27 cm AV Area (VTI):     3.35 cm AV Vmax:           143.50 cm/s AV Vmean:  93.100 cm/s AV VTI:            0.254 m AV Peak Grad:      8.2 mmHg AV Mean Grad:      4.0 mmHg LVOT Vmax:         108.00 cm/s LVOT Vmean:        73.200 cm/s LVOT VTI:          0.205 m LVOT/AV VTI ratio: 0.81 AI PHT:            492 msec  AORTA Ao Root diam: 5.40 cm MITRAL VALVE               TRICUSPID VALVE MV Area (PHT): 4.06 cm    TR Peak grad:   43.3 mmHg MV Decel Time: 187 msec    TR Vmax:        329.00 cm/s MV E velocity: 73.20 cm/s MV A velocity: 42.70 cm/s  SHUNTS MV E/A ratio:  1.71        Systemic VTI:  0.20 m                            Systemic Diam: 2.30 cm Sanda Klein MD Electronically signed by Sanda Klein MD Signature Date/Time: 01/01/2021/2:20:04 PM    Final     Cardiac Studies   Echocardiogram 01/01/2021: Impressions: 1. There appears to be global left ventricular mild hypokinesis, but  there is moderate inferolateral wall hypokinesis. Left ventricular  ejection fraction, by estimation, is 40 to 45%.  The left ventricle has  mildly decreased function. The left ventricle  demonstrates regional wall motion abnormalities (see scoring  diagram/findings for description). There is mild concentric left  ventricular hypertrophy. Left ventricular diastolic function could not be  evaluated.  2. Right ventricular systolic function is moderately reduced. The right  ventricular size is mildly enlarged. There is moderately elevated  pulmonary artery systolic pressure.  3. No left atrial mass is seen. Left atrial size was severely dilated.  4. Right atrial size was moderately dilated.  5. The mitral valve is degenerative. Mild mitral valve regurgitation. No  evidence of mitral stenosis.  6. Tricuspid valve regurgitation is mild to moderate.  7. The aortic valve is tricuspid. There is mild calcification of the  aortic valve. There is moderate thickening of the aortic valve. Aortic  valve regurgitation is mild to moderate. Mild to moderate aortic valve  sclerosis/calcification is present,  without any evidence of aortic stenosis.   Comparison(s): Prior images unable to be directly viewed, comparison made  by report only. The left ventricular function is worsened. The right  ventricular hypertrophy is worse.   Patient Profile     87 y.o. 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 Darl Householder.  Assessment & Plan    1. Acute hypoxic respiratory failure felt due to Acute on Chronic Diastolic CHF, also possible underlying pulmonary disease by CXR - Possibly triggered by recent decrease in Lasix dosing. - Chest x-ray showed progression of bilateral pulmonary densities which may represent likely worsening fibrosis or sequela of prior  inflammatory/infectious process. - Echo showed LVEF of 40-45% (down from 50-55% in 2016) with hypokinesis of inferolateral wall.  RV mildly enlarged with moderately reduced systolic function and moderately elevated PASP. Also showed mil MR, mild to moderate TR, and mild to moderate AI. - Still has significant lower extremity edema (left > right but patient states this is chronic). Patient on Coumadin so low suspicion for DVT. Suspect due to chronic venous insufficiency given SVG grafts were harvested from this leg. - Continue current dose of IV Lasix - will review with MD who has been monitoring patient daily - Continue Toprol-XL 25mg  daily - No ACE/ARB/ARNI due to renal function - Update TSH to exclude contribution of underlying thyroid abnormality - Will need eval for home O2 closer to DC  2. Possible Syncope vs Mechanical Fall with Head Strike - Patient fell when trying to plug in his iPAD, no prodromal symptoms, unclear if true syncope - Head CT negative - EKG showed no acute ischemic changes - Echo as above - No concerning arrhythmias noted on telemetry. He has chronic atrial fibrillation but rate controlled. No concerning ventricular arrhythmias or high-grade AV block - Await input from MD about whether or not outpatient monitor is needed but conservative approach may be appropriate  - Scalp lac care per IM  3. CAD s/p CABG in 2005 and Redo in 2008 - High-sensitivity troponin mildly elevated and flat at 104 >> 107. Not consistent with ACS, felt due to CHF as no anginal symptoms.  - No aspirin given need for Coumadin - Continued on home statin - Echo did show slight decrease in EF as well as new wall motion abnormality. However, he is not good candidate for cardiac catheterization given advanced age and CKD. Patient also would prefer not to have any invasive work-up so will treat medically  4. Permanent Atrial Fibrillation - Mostly rate controlled. - Continue low dose Toprol-XL as  above.  - CHAD2DS2-VASc = 5. Continue chronic anticoagulation with Coumadin per Pharmacy. - Needs ongoing follow-up as outpatient to continue to re-eval ongoing candidacy for [employer] at each OV  5. CKD Stage IV - Creatinine 2.55 on admission and has trended down/stable (2.01 today) - Continue to monitor closely with diuresis  6. Ascending Aorta Dilatation - Echo in 2016 showed ascending aorta of 68mm. Echo this admission showed normal aortic root but the ascending aorta was not well visualized. - Given advanced age and comorbid illnesses, he would not be a candidate for surgical repair therefore would not specifically trend further  7. Anemia - Hgb 11.8>10.8 - Per medicine team  8. Constipation - patient requesting stool softener, last BM yesterday - will defer to medical team for rx and monitoring  For questions or updates, please contact [place] Please consult [email or web address] for contact info under Cardiology/STEMI.  Signed, Charlie Pitter, PA-C 01/03/2021, 9:01 AM

## 2021-01-03 NOTE — Progress Notes (Signed)
Occupational Therapy Treatment Patient Details Name: [name] MRN: [id number] DOB: 11-22-1919 Today's Date: 01/03/2021    History of present illness Pt is a 87 y.o. male admitted from ALF on 12/31/20 with syncopal episode (+ LOC), fall and hitting head; pt reports similar episode 2 months prior, as well as worsening DOE and BLE edema. Workup revealed R posterior head lac post-staples, hypoxia, pulmonary edema, AKI. Head CT negative for acute injury. PMH includes afib on coumadin, HF, CKD IV.   OT comments  Patient seated in recliner, eager to participate in OT session.  Pt completing transfers with min guard fading to supervision, good hand placement and safety using rollator. Standing at sink with min guard to close supervision for grooming, then completing mobility in hallway with min guard using rollator.  Pt on 1 L upon entry Spo2 93%, RA briefly with desaturation to 86% and donned 2L for standing/walking activities; weaned back down to 1L upon exit with pt sitting in recliner.  Encouraged mobility with nursing as able.  Will follow.    Follow Up Recommendations  Home health OT;Supervision - Intermittent (assist for ADls and mobility)    Equipment Recommendations  3 in 1 bedside commode    Recommendations for Other Services      Precautions / Restrictions Precautions Precautions: Fall;Other (comment) Precaution Comments: Watch SpO2 Restrictions Weight Bearing Restrictions: No       Mobility Bed Mobility               General bed mobility comments: in chair upon arrival    Transfers Overall transfer level: Needs assistance Equipment used: 4-wheeled walker Transfers: Sit to/from Stand Sit to Stand: Min guard;Supervision         General transfer comment: min guar fading to supervision, good hand placement and safety    Balance Overall balance assessment: Needs assistance Sitting-balance support: Feet supported;No upper extremity supported Sitting  balance-Leahy Scale: Good     Standing balance support: Bilateral upper extremity supported;No upper extremity supported;During functional activity Standing balance-Leahy Scale: Fair Standing balance comment: No UE support during ADls min guard to supervision; relies on BUE support dynamically                           ADL either performed or assessed with clinical judgement   ADL Overall ADL's : Needs assistance/impaired     Grooming: Supervision/safety;Standing                   Toilet Transfer: Min guard;Ambulation (rollator)   Toileting- Water quality scientist and Hygiene: Supervision/safety;Sit to/from stand       Functional mobility during ADLs: Min guard;Supervision/safety (rollator) General ADL Comments: pt limited by weakness, decreased activity tolerance, impaired balance     Vision       Perception     Praxis      Cognition Arousal/Alertness: Awake/alert Behavior During Therapy: WFL for tasks assessed/performed Overall Cognitive Status: Within Functional Limits for tasks assessed                                 General Comments: WFL for simple tasks; requires some repetition due to [facility]        Exercises     Shoulder Instructions       General Comments SpO2 on 1L upon entry, desat to 86% on RA. Donned 2L for mobility in hallway.    Pertinent  Vitals/ Pain       Pain Assessment: No/denies pain  Home Living                                          Prior Functioning/Environment              Frequency  Min 2X/week        Progress Toward Goals  OT Goals(current goals can now be found in the care plan section)  Progress towards OT goals: Progressing toward goals  Acute Rehab OT Goals Patient Stated Goal: Return home OT Goal Formulation: With patient  Plan Discharge plan remains appropriate;Frequency remains appropriate    Co-evaluation                 AM-PAC OT "6 Clicks"  Daily Activity     Outcome Measure   Help from another person eating meals?: None Help from another person taking care of personal grooming?: A Little Help from another person toileting, which includes using toliet, bedpan, or urinal?: A Little Help from another person bathing (including washing, rinsing, drying)?: A Lot Help from another person to put on and taking off regular upper body clothing?: A Little Help from another person to put on and taking off regular lower body clothing?: A Lot 6 Click Score: 17    End of Session Equipment Utilized During Treatment: Other (comment);Oxygen (rollator)  OT Visit Diagnosis: Other abnormalities of gait and mobility (R26.89);Muscle weakness (generalized) (M62.81);History of falling (Z91.81)   Activity Tolerance Patient tolerated treatment well   Patient Left in chair;with call bell/phone within reach;with chair alarm set   Nurse Communication Mobility status        Time: 1856-3149 OT Time Calculation (min): 30 min  Charges: OT General Charges $OT Visit: 1 Visit OT Treatments $Self Care/Home Management : 23-37 mins  [place] Pager [phone] Office [phone]    Delight Stare 01/03/2021, 3:55 PM

## 2021-01-03 NOTE — Progress Notes (Signed)
ANTICOAGULATION CONSULT NOTE - Follow Up Consult  Pharmacy Consult for Warfarin Indication: atrial fibrillation  Allergies  Allergen Reactions  . Penicillins     Unknown    Patient Measurements: Height: 5' (152.4 cm) Weight: 67.8 kg (149 lb 7.6 oz) IBW/kg (Calculated) : 50  Vital Signs: Temp: 98.3 F (36.8 C) (05/27 0826) Temp Source: Oral (05/27 0826) BP: 105/66 (05/27 0528) Pulse Rate: 81 (05/27 0528)  Labs: Recent Labs    12/31/20 1630 12/31/20 1646 12/31/20 1846 01/01/21 0213 01/02/21 0301 01/03/21 0348  HGB 11.8*  --   --  10.8*  --   --   HCT 37.1*  --   --  33.7*  --   --   PLT 125*  --   --  115*  --   --   LABPROT 24.2*  --   --  24.4* 24.6* 27.7*  INR 2.2*  --   --  2.2* 2.2* 2.6*  CREATININE 2.55*  --   --  2.26* 2.08* 2.01*  TROPONINIHS  --  104* 107*  --   --   --     Estimated Creatinine Clearance: 16.6 mL/min (A) (by C-G formula based on SCr of 2.01 mg/dL (H)).  Assessment:  87 yr old male admitted 5/24 after syncope, fall and LOC. Noted to have hit the back of his head and has scalp laceration, stapled.  No bleeding per CT head.   Continues on Warfarin as PTA for atrial fibrillation.      INR remains therapeutic, 2.2 x 3 days and 2.6 today.  Thrombocytopenia. No bleeding noted.  Last CBC 5/25.    PTA warfarin regimen per facility MAR:  1 mg MWF, 1.5 mg TTSS (9 mg weekly), since 11/09/20.  Previously on 1.25 mg daily (8.75 mg weekly).  Goal of Therapy:  INR 2-3 Monitor platelets by anticoagulation protocol: Yes   Plan:  Continue PTA warfarin regimen: 1.5 mg TTSS, 1 mg MWF. - 1 mg today. Continue daily PT/INR for now.  q 72h CBC in am. Monitor for s/sx bleeding.  Arty Baumgartner, [place] 01/03/2021,8:44 AM

## 2021-01-03 NOTE — Progress Notes (Signed)
Physical Therapy Treatment Patient Details Name: [name] MRN: [id number] DOB: 1919-10-03 Today's Date: 01/03/2021    History of Present Illness Pt is a 87 y.o. male admitted from ALF on 12/31/20 with syncopal episode (+ LOC), fall and hitting head; pt reports similar episode 2 months prior, as well as worsening DOE and BLE edema. Workup revealed R posterior head lac post-staples, hypoxia, pulmonary edema, AKI. Head CT negative for acute injury. PMH includes afib on coumadin, HF, CKD IV.    PT Comments    Patient progressing well towards PT goals. Improved ambulation distance with Min guard assist and use of rollator for support. Reports feeling less SOB than prior session. SpO2 down to 86% on RA, donned 2L and able to maintain Sp02 >91% with activity. 2/4 DOE noted with activity. Weaned 02 down to 1L at rest in room to attempt with weaning supplemental oxygen. Encouraged walking with nursing as able. Will continue to follow and progress as tolerated.   Follow Up Recommendations  Home health PT;Supervision - Intermittent     Equipment Recommendations  None recommended by PT    Recommendations for Other Services       Precautions / Restrictions Precautions Precautions: Fall;Other (comment) Precaution Comments: Watch SpO2 Restrictions Weight Bearing Restrictions: No    Mobility  Bed Mobility               General bed mobility comments: Up in chair upon PT arrival.    Transfers Overall transfer level: Needs assistance Equipment used: 4-wheeled walker Transfers: Sit to/from Stand Sit to Stand: Min guard         General transfer comment: Min guard for safety. Stood from chair x1 with good demonstration of hand placement. Stood for 3 minutes to urinate prior to walking.  Ambulation/Gait Ambulation/Gait assistance: Min guard Gait Distance (Feet): 120 Feet Assistive device: 4-wheeled walker Gait Pattern/deviations: Step-through pattern;Decreased stride  length;Trunk flexed Gait velocity: Decreased   General Gait Details: Slow, steady gait with rollator and intermittent min guard for balance; DOE 2/4, SpO2 down to 86% on RA, donned 2L and able to maintain Sp02 >91% with activity.   Stairs             Wheelchair Mobility    Modified Rankin (Stroke Patients Only)       Balance Overall balance assessment: Needs assistance Sitting-balance support: Feet supported;No upper extremity supported Sitting balance-Leahy Scale: Good     Standing balance support: During functional activity Standing balance-Leahy Scale: Fair Standing balance comment: Can static stand without UE support; improved static/dynamic stability with UE support                            Cognition Arousal/Alertness: Awake/alert Behavior During Therapy: WFL for tasks assessed/performed Overall Cognitive Status: Within Functional Limits for tasks assessed                                 General Comments: WFL for simple tasks; requires some repetition due to [facility]      Exercises      General Comments General comments (skin integrity, edema, etc.): SpO2 down to 86% on RA, donned 2L and able to maintain Sp02 >91% with activity.      Pertinent Vitals/Pain Pain Assessment: No/denies pain    Home Living  Prior Function            PT Goals (current goals can now be found in the care plan section) Progress towards PT goals: Progressing toward goals    Frequency    Min 3X/week      PT Plan Current plan remains appropriate    Co-evaluation              AM-PAC PT "6 Clicks" Mobility   Outcome Measure  Help needed turning from your back to your side while in a flat bed without using bedrails?: None Help needed moving from lying on your back to sitting on the side of a flat bed without using bedrails?: None Help needed moving to and from a bed to a chair (including a wheelchair)?: A  Little Help needed standing up from a chair using your arms (e.g., wheelchair or bedside chair)?: A Little Help needed to walk in hospital room?: A Little Help needed climbing 3-5 steps with a railing? : A Lot 6 Click Score: 19    End of Session Equipment Utilized During Treatment: Gait belt;Oxygen Activity Tolerance: Patient tolerated treatment well Patient left: in chair;with call bell/phone within reach;with chair alarm set Nurse Communication: Mobility status PT Visit Diagnosis: Other abnormalities of gait and mobility (R26.89);Muscle weakness (generalized) (M62.81)     Time: 0263-7858 PT Time Calculation (min) (ACUTE ONLY): 25 min  Charges:  $Gait Training: 8-22 mins $Therapeutic Activity: 8-22 mins                     Marisa Severin, PT, DPT Acute Rehabilitation Services Pager [phone] Office [place] 01/03/2021, 12:44 PM

## 2021-01-03 NOTE — Progress Notes (Addendum)
Error

## 2021-01-03 NOTE — Progress Notes (Signed)
PROGRESS NOTE    [name]  KZS:[id number] DOB: [birth year] DOA: 12/31/2020 PCP: Merryl Hacker, No    Brief Narrative:  [name] is a 87 year old male with past medical history significant for paroxysmal atrial fibrillation on Coumadin, chronic diastolic congestive heart failure, CKD stage IV, anemia due to CKD, HLD, hypothyroidism, depression, anxiety who presented to the ED with concerns of syncope and fall.  Patient was apparently trying to plug in his iPad but suddenly lost consciousness and fell backwards hitting his head on a bedside table.  Patient sustained laceration to his right posterior scalp.  Patient reports increasing dyspnea on exertion for the past 2-3 months with worsening chronic lower extremity edema.  Recently had his Lasix dose decreased due to worsening creatinine level.  Similar episode roughly 2 months ago when he was sitting up in bed.  Denies dizziness or lightheadedness.  No chest pain or palpitations.  In the ED, temperature 97.7 F, HR 80, RR 20, BP 142/76, SPO2 86% on 4 L nasal cannula.  Sodium 135, potassium 4.5, chloride 97, CO2 27, glucose 146, BUN 48, creatinine 2.55.  BNP 749.5.  Troponin 104>107.  WBC 7.8, hemoglobin 11.8, platelets 125.  Cova-19 PCR negative.  Influenza A/B PCR negative.  CT head/C-spine with no acute intracranial pathology, age-related atrophy and chronic microvascular ischemic changes, no acute/traumatic cervical spine pathology, partially visualized bilateral pleural effusions and findings of pulmonary edema.  Chest x-ray with progression of bilateral pulmonary densities, likely worsening fibrosis or sequela of prior inflammatory/infectious process. Pelvis x-ray negative.  EDP consulted hospital service for further evaluation management of acute on chronic diastolic congestive heart failure.   Assessment & Plan:   Principal Problem:   Acute CHF (congestive heart failure) (HCC) Active Problems:   Syncope and collapse   Acute  respiratory failure with hypoxia (HCC)   Acute-on-chronic kidney injury ([place])   Scalp laceration   Unspecified atrial fibrillation (HCC)   HLD (hyperlipidemia)   Hypothyroidism   Acute hypoxic respiratory failure, POA Acute on chronic diastolic congestive heart failure Patient presenting to the ED with progressive shortness of breath, lower extremity edema and syncopal event with recent decrease in home diuretic regimen.  Patient was noted to have pulmonary edema on CT scanning with elevated BNP.  Patient hypoxic on presentation with SPO2 86% on 4 L nasal cannula.  TTE with LVEF 40-45%, mild LV hypokinesis, moderate inferior lateral wall hypokinesis, mild concentric LVH, RV function mildly reduced, RA moderately dilated, mild MR/TR. --Cardiology following, appreciate assistance --net negative 440mL past 24h and net negative 2.0L since admission --wt 68>67.9>67.8kg --Furosemide 40 mg IV q12h --Not on ACE inhibitor/ARB/ARNI due to renal dysfunction --Strict I's and O's Daily weights --Continue supplemental oxygen, maintain SPO2 greater than 92%, on 3L [place] w/ SpO2 99% --BMP daily --Home O2 screen at time of discharge  Syncope with loss of consciousness Suspect secondary to prolonged hypoxia from diastolic CHF as above.  TTE with mildly reduced LVEF, otherwise no significant valvular abnormalities.  No concerning arrhythmias or high-grade AV block noted on telemetry such far. --Continue to monitor for arrhythmias on telemetry --PT/OT recommend home health on discharge --Will likely need supplemental oxygen on discharge, O2 screen closer to dc  Acute renal failure on CKD stage IV Baseline creatinine 2.2 to 2.5.  On admission, creatinine elevated 2.55.  Etiology likely volume overload.   --Cr 2.55>2.26>2.08>2.01 --Continue IV diuresis as above --Avoid nephrotoxins, renal dose all medications --BMP daily  Posterior scalp laceration Following syncopal event at home,  patient sustained  posterior scalp laceration.  Underwent skin approximation by EDP with staples on 12/31/2020. --Will need staple removal 7 days (5/31)  Permanent atrial fibrillation --Metoprolol succinate 25 mg p.o. 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  Not a surgical candidate given his age and comorbidities.  Patient showed normal aortic root but ascending aorta was not well visualized.  Given his advanced age and comorbidities, not a candidate for surgical repair per cardiology.  Hyperlipidemia: Simvastatin 10 mg p.o. daily  Hypothyroidism: Levothyroxine 88 mcg p.o. daily  Depression/anxiety --Sertraline 25 mg p.o. twice daily --Diazepam 2.5 mg p.o. every 6 hours as needed anxiety  GERD: Continue PPI   DVT prophylaxis: coumadin   Code Status: DNR Family Communication: updated patient's daughter [name] via telephone this morning.  Disposition Plan:  Level of care: Telemetry Cardiac Status is: Inpatient  Remains inpatient appropriate because:Ongoing diagnostic testing needed not appropriate for outpatient work up, Unsafe d/c plan, IV treatments appropriate due to intensity of illness or inability to take PO and Inpatient level of care appropriate due to severity of illness   Dispo: The patient is from: [place]              Anticipated d/c is to: To be determined              Patient currently is not medically stable to d/c.   Difficult to place patient No    Consultants:   Cardiology  Procedures:   Scalp laceration repair with staples 12/31/2020  Antimicrobials:   None   Subjective: Patient seen examined at bedside, resting comfortably.  Sitting at edge of bed.  No family present in room this morning.  Continues reports improvement of shortness of breath and lower extremity edema.  Continues on supplemental oxygen.  Seen by  cardiology this morning with continued plan for IV diuresis today.  No other questions or concerns at this time. Denies headache, no fever/chills/night sweats, no dizziness, no visual changes, no chest pain, no abdominal pain, no weakness, no fatigue, no paresthesias.  No acute events overnight per nurse staff.  Objective: Vitals:   01/02/21 1953 01/03/21 0528 01/03/21 0826 01/03/21 0842  BP: 115/83 105/66  109/67  Pulse: 77 81  87  Resp: 18 20    Temp: 97.7 F (36.5 C) 98.2 F (36.8 C) 98.3 F (36.8 C)   TempSrc: Oral Oral Oral   SpO2: 100% 99%    Weight:  67.8 kg    Height:        Intake/Output Summary (Last 24 hours) at 01/03/2021 1034 Last data filed at 01/03/2021 0916 Gross per 24 hour  Intake 1200 ml  Output 1650 ml  Net -450 ml   Filed Weights   12/31/20 1648 01/01/21 0015 01/03/21 0528  Weight: 68 kg 67.9 kg 67.8 kg    Examination:  General exam: Appears calm and comfortable, elderly in appearance Respiratory system: Breath sounds slightly decreased bilateral bases with mild crackles, no wheezing, normal respiratory effort, on 3 L nasal cannula with SPO2 99% Cardiovascular system: S1 & S2 heard, irregularly irregular rhythm, normal rate. No JVD, murmurs, rubs, gallops or clicks.  1+ pitting edema bilateral lower extremities to mid shin Gastrointestinal system: Abdomen is nondistended, soft and nontender. No organomegaly or masses felt. Normal bowel sounds heard. Central nervous system: Alert and oriented. No focal neurological deficits. Extremities: Symmetric 5  x 5 power. Skin: No rashes, lesions or ulcers Psychiatry: Judgement and insight appear normal. Mood & affect appropriate.     Data Reviewed: I have personally reviewed following labs and imaging studies  CBC: Recent Labs  Lab 12/31/20 1630 01/01/21 0213  WBC 7.8 7.7  NEUTROABS 5.6  --   HGB 11.8* 10.8*  HCT 37.1* 33.7*  MCV 96.1 93.9  PLT 125* 676*   Basic Metabolic Panel: Recent Labs  Lab  12/31/20 1630 01/01/21 0213 01/02/21 0301 01/03/21 0348  NA 135 136 136 135  K 4.5 4.3 4.0 3.6  CL 97* 101 102 99  CO2 27 27 26 30   GLUCOSE 146* 112* 100* 93  BUN 48* 47* 46* 46*  CREATININE 2.55* 2.26* 2.08* 2.01*  CALCIUM 8.9 8.6* 8.3* 8.1*   GFR: Estimated Creatinine Clearance: 16.6 mL/min (A) (by C-G formula based on SCr of 2.01 mg/dL (H)). Liver Function Tests: Recent Labs  Lab 12/31/20 1630  AST 29  ALT 23  ALKPHOS 82  BILITOT 1.0  PROT 6.6  ALBUMIN 3.6   No results for input(s): LIPASE, AMYLASE in the last 168 hours. No results for input(s): AMMONIA in the last 168 hours. Coagulation Profile: Recent Labs  Lab 12/31/20 1630 01/01/21 0213 01/02/21 0301 01/03/21 0348  INR 2.2* 2.2* 2.2* 2.6*   Cardiac Enzymes: No results for input(s): CKTOTAL, CKMB, CKMBINDEX, TROPONINI in the last 168 hours. BNP (last 3 results) No results for input(s): PROBNP in the last 8760 hours. HbA1C: No results for input(s): HGBA1C in the last 72 hours. CBG: No results for input(s): GLUCAP in the last 168 hours. Lipid Profile: No results for input(s): CHOL, HDL, LDLCALC, TRIG, CHOLHDL, LDLDIRECT in the last 72 hours. Thyroid Function Tests: No results for input(s): TSH, T4TOTAL, FREET4, T3FREE, THYROIDAB in the last 72 hours. Anemia Panel: No results for input(s): VITAMINB12, FOLATE, FERRITIN, TIBC, IRON, RETICCTPCT in the last 72 hours. Sepsis Labs: No results for input(s): PROCALCITON, LATICACIDVEN in the last 168 hours.  Recent Results (from the past 240 hour(s))  Resp Panel by RT-PCR (Flu A&B, Covid) Nasopharyngeal Swab     Status: None   Collection Time: 12/31/20  4:46 PM   Specimen: Nasopharyngeal Swab; Nasopharyngeal(NP) swabs in vial transport medium  Result Value Ref Range Status   SARS Coronavirus 2 by RT PCR NEGATIVE NEGATIVE Final    Comment: (NOTE) SARS-CoV-2 target nucleic acids are NOT DETECTED.  The SARS-CoV-2 RNA is generally detectable in upper  respiratory specimens during the acute phase of infection. The lowest concentration of SARS-CoV-2 viral copies this assay can detect is 138 copies/mL. A negative result does not preclude SARS-Cov-2 infection and should not be used as the sole basis for treatment or other patient management decisions. A negative result may occur with  improper specimen collection/handling, submission of specimen other than nasopharyngeal swab, presence of viral mutation(s) within the areas targeted by this assay, and inadequate number of viral copies(<138 copies/mL). A negative result must be combined with clinical observations, patient history, and epidemiological information. The expected result is Negative.  Fact Sheet for Patients:  EntrepreneurPulse.com.au  Fact Sheet for Healthcare Providers:  IncredibleEmployment.be  This test is no t yet approved or cleared by the [place] FDA and  has been authorized for detection and/or diagnosis of SARS-CoV-2 by FDA under an Emergency Use Authorization (EUA). This EUA will remain  in effect (meaning this test can be used) for the duration of the COVID-19 declaration under Section 564(b)(1) of the Act, 21  U.S.C.section 360bbb-3(b)(1), unless the authorization is terminated  or revoked sooner.       Influenza A by PCR NEGATIVE NEGATIVE Final   Influenza B by PCR NEGATIVE NEGATIVE Final    Comment: (NOTE) The Xpert Xpress SARS-CoV-2/FLU/RSV plus assay is intended as an aid in the diagnosis of influenza from Nasopharyngeal swab specimens and should not be used as a sole basis for treatment. Nasal washings and aspirates are unacceptable for Xpert Xpress SARS-CoV-2/FLU/RSV testing.  Fact Sheet for Patients: EntrepreneurPulse.com.au  Fact Sheet for Healthcare Providers: IncredibleEmployment.be  This test is not yet approved or cleared by the [place] FDA and has been  authorized for detection and/or diagnosis of SARS-CoV-2 by FDA under an Emergency Use Authorization (EUA). This EUA will remain in effect (meaning this test can be used) for the duration of the COVID-19 declaration under Section 564(b)(1) of the Act, 21 U.S.C. section 360bbb-3(b)(1), unless the authorization is terminated or revoked.  Performed at [facility], [place] [address], [place],  [address]          Radiology Studies: ECHOCARDIOGRAM COMPLETE  Result Date: 01/01/2021    ECHOCARDIOGRAM REPORT   Patient Name:   [name] Date of Exam: 01/01/2021 Medical Rec #:  [id number]         Height:       60.0 in Accession #:    [id number]        Weight:       149.7 lb Date of Birth:  1920/07/08         BSA:          1.650 m Patient Age:    55 years          BP:           117/70 mmHg Patient Gender: M                 HR:           99 bpm. Exam Location:  Inpatient Procedure: 2D Echo, Cardiac Doppler and Color Doppler Indications:    CHF  History:        Patient has no prior history of Echocardiogram examinations.                 CHF, CAD and LA myxoma, Prior CABG, Aortic Valve Disease,                 Arrythmias:Atrial Fibrillation, Signs/Symptoms:Shortness of                 Breath; Risk Factors:Hypertension. CKD.  Sonographer:    Dustin Flock Referring Phys: [id number] [place]  1. There appears to be global left ventricular mild hypokinesis, but there is moderate inferolateral wall hypokinesis. Left ventricular ejection fraction, by estimation, is 40 to 45%. The left ventricle has mildly decreased function. The left ventricle demonstrates regional wall motion abnormalities (see scoring diagram/findings for description). There is mild concentric left ventricular hypertrophy. Left ventricular diastolic function could not be evaluated.  2. Right ventricular systolic function is moderately reduced. The right ventricular size is mildly enlarged. There is moderately elevated  pulmonary artery systolic pressure.  3. No left atrial mass is seen. Left atrial size was severely dilated.  4. Right atrial size was moderately dilated.  5. The mitral valve is degenerative. Mild mitral valve regurgitation. No evidence of mitral stenosis.  6. Tricuspid valve regurgitation is mild to moderate.  7. The aortic valve is tricuspid. There is mild  calcification of the aortic valve. There is moderate thickening of the aortic valve. Aortic valve regurgitation is mild to moderate. Mild to moderate aortic valve sclerosis/calcification is present, without any evidence of aortic stenosis. Comparison(s): Prior images unable to be directly viewed, comparison made by report only. The left ventricular function is worsened. The right ventricular hypertrophy is worse. FINDINGS  Left Ventricle: There appears to be global left ventricular mild hypokinesis, but there is moderate inferolateral wall hypokinesis. Left ventricular ejection fraction, by estimation, is 40 to 45%. The left ventricle has mildly decreased function. The left ventricle demonstrates regional wall motion abnormalities. The left ventricular internal cavity size was normal in size. There is mild concentric left ventricular hypertrophy. Abnormal (paradoxical) septal motion consistent with post-operative status. Left ventricular diastolic function could not be evaluated due to atrial fibrillation. Left ventricular diastolic function could not be evaluated. Right Ventricle: The right ventricular size is mildly enlarged. No increase in right ventricular wall thickness. Right ventricular systolic function is moderately reduced. There is moderately elevated pulmonary artery systolic pressure. The tricuspid regurgitant velocity is 3.29 m/s, and with an assumed right atrial pressure of 5 mmHg, the estimated right ventricular systolic pressure is 09.3 mmHg. Left Atrium: No left atrial mass is seen. Left atrial size was severely dilated. Right Atrium: Right  atrial size was moderately dilated. Pericardium: There is no evidence of pericardial effusion. Mitral Valve: The mitral valve is degenerative in appearance. There is mild thickening of the mitral valve leaflet(s). Mild to moderate mitral annular calcification. Mild mitral valve regurgitation, with centrally-directed jet. No evidence of mitral valve stenosis. Tricuspid Valve: The tricuspid valve is normal in structure. Tricuspid valve regurgitation is mild to moderate. Aortic Valve: The aortic valve is tricuspid. There is mild calcification of the aortic valve. There is moderate thickening of the aortic valve. Aortic valve regurgitation is mild to moderate. Aortic regurgitation PHT measures 492 msec. Mild to moderate aortic valve sclerosis/calcification is present, without any evidence of aortic stenosis. Aortic valve mean gradient measures 4.0 mmHg. Aortic valve peak gradient measures 8.2 mmHg. Aortic valve area, by VTI measures 3.35 cm. Pulmonic Valve: The pulmonic valve was not well visualized. Pulmonic valve regurgitation is not visualized. Aorta: The aortic root is normal in size and structure and the ascending aorta was not well visualized. Venous: The inferior vena cava was not well visualized. IAS/Shunts: No atrial level shunt detected by color flow Doppler. There is no evidence of an atrial septal defect.  LEFT VENTRICLE PLAX 2D LVIDd:         4.60 cm  Diastology LVIDs:         3.20 cm  LV e' medial:    2.51 cm/s LV PW:         1.30 cm  LV E/e' medial:  29.2 LV IVS:        1.20 cm  LV e' lateral:   3.16 cm/s LVOT diam:     2.30 cm  LV E/e' lateral: 23.2 LV SV:         85 LV SV Index:   52 LVOT Area:     4.15 cm  RIGHT VENTRICLE RV Basal diam:  4.40 cm RV S prime:     6.24 cm/s TAPSE (M-mode): 1.6 cm LEFT ATRIUM             Index       RIGHT ATRIUM           Index LA diam:  5.00 cm 3.03 cm/m  RA Area:     24.30 cm LA Vol (A2C):   52.9 ml 32.05 ml/m RA Volume:   77.00 ml  46.66 ml/m LA Vol (A4C):    66.0 ml 39.99 ml/m LA Biplane Vol: 61.6 ml 37.33 ml/m  AORTIC VALVE AV Area (Vmax):    3.13 cm AV Area (Vmean):   3.27 cm AV Area (VTI):     3.35 cm AV Vmax:           143.50 cm/s AV Vmean:          93.100 cm/s AV VTI:            0.254 m AV Peak Grad:      8.2 mmHg AV Mean Grad:      4.0 mmHg LVOT Vmax:         108.00 cm/s LVOT Vmean:        73.200 cm/s LVOT VTI:          0.205 m LVOT/AV VTI ratio: 0.81 AI PHT:            492 msec  AORTA Ao Root diam: 5.40 cm MITRAL VALVE               TRICUSPID VALVE MV Area (PHT): 4.06 cm    TR Peak grad:   43.3 mmHg MV Decel Time: 187 msec    TR Vmax:        329.00 cm/s MV E velocity: 73.20 cm/s MV A velocity: 42.70 cm/s  SHUNTS MV E/A ratio:  1.71        Systemic VTI:  0.20 m                            Systemic Diam: 2.30 cm Dani Gobble Croitoru MD Electronically signed by Sanda Klein MD Signature Date/Time: 01/01/2021/2:20:04 PM    Final         Scheduled Meds: . cholecalciferol  1,000 Units Oral Daily  . furosemide  40 mg Intravenous BID  . levothyroxine  88 mcg Oral QAC breakfast  . loratadine  10 mg Oral Daily  . metoprolol succinate  25 mg Oral Daily  . pantoprazole  40 mg Oral Daily  . sertraline  25 mg Oral BID  . simvastatin  10 mg Oral Daily  . warfarin  1.5 mg Oral Once per day on Sun Tue Thu Sat   And  . warfarin  1 mg Oral Once per day on Mon Wed Fri  . Warfarin - Pharmacist Dosing Inpatient   Does not apply q1600   Continuous Infusions:   LOS: 3 days    Time spent: 38 minutes spent on chart review, discussion with nursing staff, consultants, updating family and interview/physical exam; more than 50% of that time was spent in counseling and/or coordination of care.    Waylynn Benefiel J [place] DO [facility] Available via Epic secure chat 7am-7pm After these hours, please refer to coverage provider listed on [email or web address] 01/03/2021, 10:34 AM

## 2021-01-04 DIAGNOSIS — I5041 Acute combined systolic (congestive) and diastolic (congestive) heart failure: Secondary | ICD-10-CM | POA: Diagnosis not present

## 2021-01-04 DIAGNOSIS — E785 Hyperlipidemia, unspecified: Secondary | ICD-10-CM | POA: Diagnosis not present

## 2021-01-04 DIAGNOSIS — J9601 Acute respiratory failure with hypoxia: Secondary | ICD-10-CM | POA: Diagnosis not present

## 2021-01-04 DIAGNOSIS — N170 Acute kidney failure with tubular necrosis: Secondary | ICD-10-CM | POA: Diagnosis not present

## 2021-01-04 LAB — CBC
HCT: 32.2 % — ABNORMAL LOW (ref 39.0–52.0)
Hemoglobin: 10.3 g/dL — ABNORMAL LOW (ref 13.0–17.0)
MCH: 30.1 pg (ref 26.0–34.0)
MCHC: 32 g/dL (ref 30.0–36.0)
MCV: 94.2 fL (ref 80.0–100.0)
Platelets: 99 10*3/uL — ABNORMAL LOW (ref 150–400)
RBC: 3.42 MIL/uL — ABNORMAL LOW (ref 4.22–5.81)
RDW: 16.8 % — ABNORMAL HIGH (ref 11.5–15.5)
WBC: 6.9 10*3/uL (ref 4.0–10.5)
nRBC: 0 % (ref 0.0–0.2)

## 2021-01-04 LAB — BASIC METABOLIC PANEL
Anion gap: 7 (ref 5–15)
BUN: 45 mg/dL — ABNORMAL HIGH (ref 8–23)
CO2: 30 mmol/L (ref 22–32)
Calcium: 8.3 mg/dL — ABNORMAL LOW (ref 8.9–10.3)
Chloride: 97 mmol/L — ABNORMAL LOW (ref 98–111)
Creatinine, Ser: 1.83 mg/dL — ABNORMAL HIGH (ref 0.61–1.24)
GFR, Estimated: 33 mL/min — ABNORMAL LOW (ref >60)
Glucose, Bld: 102 mg/dL — ABNORMAL HIGH (ref 70–99)
Potassium: 3.7 mmol/L (ref 3.5–5.1)
Sodium: 134 mmol/L — ABNORMAL LOW (ref 135–145)

## 2021-01-04 LAB — PROTIME-INR
INR: 2.8 — ABNORMAL HIGH (ref 0.8–1.2)
Prothrombin Time: 29.3 seconds — ABNORMAL HIGH (ref 11.4–15.2)

## 2021-01-04 LAB — T4, FREE: Free T4: 1.11 ng/dL (ref 0.61–1.12)

## 2021-01-04 MED ORDER — WARFARIN SODIUM 1 MG PO TABS
1.0000 mg | ORAL_TABLET | Freq: Once | ORAL | Status: AC
  Administered 2021-01-04: 1 mg via ORAL
  Filled 2021-01-04: qty 1

## 2021-01-04 MED ORDER — SENNOSIDES-DOCUSATE SODIUM 8.6-50 MG PO TABS
2.0000 | ORAL_TABLET | Freq: Two times a day (BID) | ORAL | Status: DC
  Administered 2021-01-04 – 2021-01-05 (×4): 2 via ORAL
  Filled 2021-01-04 (×4): qty 2

## 2021-01-04 MED ORDER — LEVOTHYROXINE SODIUM 100 MCG PO TABS
100.0000 ug | ORAL_TABLET | Freq: Every day | ORAL | Status: DC
  Administered 2021-01-05 – 2021-01-08 (×4): 100 ug via ORAL
  Filled 2021-01-04 (×4): qty 1

## 2021-01-04 MED ORDER — MAGNESIUM CITRATE PO SOLN
1.0000 | Freq: Once | ORAL | Status: AC
  Administered 2021-01-04: 1 via ORAL
  Filled 2021-01-04: qty 296

## 2021-01-04 MED ORDER — FUROSEMIDE 40 MG PO TABS
40.0000 mg | ORAL_TABLET | Freq: Every day | ORAL | Status: DC
  Administered 2021-01-04 – 2021-01-06 (×3): 40 mg via ORAL
  Filled 2021-01-04 (×4): qty 1

## 2021-01-04 NOTE — Progress Notes (Addendum)
Progress Note  Patient Name: [name] Date of Encounter: 01/04/2021  [facility] Cardiologist: Freada Bergeron, MD   Subjective   Pt denies CP; still with some dyspnea on exertion.  Inpatient Medications    Scheduled Meds: . cholecalciferol  1,000 Units Oral Daily  . furosemide  40 mg Intravenous BID  . levothyroxine  88 mcg Oral QAC breakfast  . loratadine  10 mg Oral Daily  . mouth rinse  15 mL Mouth Rinse BID  . metoprolol succinate  25 mg Oral Daily  . pantoprazole  40 mg Oral Daily  . sertraline  25 mg Oral BID  . simvastatin  10 mg Oral Daily  . warfarin  1.5 mg Oral Once per day on Sun Tue Thu Sat   And  . warfarin  1 mg Oral Once per day on Mon Wed Fri  . Warfarin - Pharmacist Dosing Inpatient   Does not apply q1600   Continuous Infusions:  PRN Meds: acetaminophen, diazepam   Vital Signs    Vitals:   01/04/21 0244 01/04/21 0400 01/04/21 0500 01/04/21 0547  BP:    98/62  Pulse:  83 70 92  Resp:    15  Temp:      TempSrc:      SpO2:  95% 93% 96%  Weight: 67.3 kg     Height:        Intake/Output Summary (Last 24 hours) at 01/04/2021 0718 Last data filed at 01/04/2021 0238 Gross per 24 hour  Intake 840 ml  Output 1300 ml  Net -460 ml   Last 3 Weights 01/04/2021 01/03/2021 01/01/2021  Weight (lbs) 148 lb 5.9 oz 149 lb 7.6 oz 149 lb 11.1 oz  Weight (kg) 67.3 kg 67.8 kg 67.9 kg      Telemetry    Atrial fibrillation rate controlled- Personally Reviewed   Physical Exam   GEN: Elderly, NAD Neck: supple Cardiac: Irregular Respiratory: CTA GI: Soft, NT, ND MS: trace edema Neuro:  Grossly intact Psych: Normal affect   Labs    High Sensitivity Troponin:   Recent Labs  Lab 12/31/20 1646 12/31/20 1846  TROPONINIHS 104* 107*      Chemistry Recent Labs  Lab 12/31/20 1630 01/01/21 0213 01/02/21 0301 01/03/21 0348 01/04/21 0212  NA 135   < > 136 135 134*  K 4.5   < > 4.0 3.6 3.7  CL 97*   < > 102 99 97*  CO2 27   < > 26  30 30   GLUCOSE 146*   < > 100* 93 102*  BUN 48*   < > 46* 46* 45*  CREATININE 2.55*   < > 2.08* 2.01* 1.83*  CALCIUM 8.9   < > 8.3* 8.1* 8.3*  PROT 6.6  --   --   --   --   ALBUMIN 3.6  --   --   --   --   AST 29  --   --   --   --   ALT 23  --   --   --   --   ALKPHOS 82  --   --   --   --   BILITOT 1.0  --   --   --   --   GFRNONAA 22*   < > 28* 29* 33*  ANIONGAP 11   < > 8 6 7    < > = values in this interval not displayed.     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  Echocardiogram this admission shows ejection fraction 40 to 45%, mild left ventricular hypertrophy, mild right ventricular enlargement with moderate RV dysfunction, severe left atrial enlargement, moderate right atrial enlargement, mild mitral regurgitation, mild to moderate tricuspid regurgitation, mild to moderate aortic insufficiency.  Assessment & Plan    1 acute on chronic combined systolic/diastolic congestive heart failure-he does not appear to be markedly volume overloaded on examination. I/O-750.  We will continue Lasix 40 mg daily.  Follow renal function closely.  2 cardiomyopathy-we will continue beta-blocker.  Patient not a candidate for aggressive cardiac evaluation given chronic kidney disease and age.  3 permanent atrial fibrillation-patient's heart rate is controlled.  Continue beta-blocker at  present dose.  Continue anticoagulation with Coumadin.  4 valvular heart disease-conservative measures given patient's age.  5 coronary artery disease-continue statin.  No aspirin given need for Coumadin.  6 chronic stage IV kidney disease-creatinine has improved since admission with diuresis.  Continue to follow.  7 Interstitial lung disease-Per primary care.  For questions or updates, please contact [place] Please consult [email or web address] for contact info under    Signed, Kirk Ruths, MD  01/04/2021, 7:18 AM

## 2021-01-04 NOTE — Progress Notes (Addendum)
PROGRESS NOTE    [name]  AJG:[id number] DOB: 10-20-1919 DOA: 12/31/2020 PCP: Merryl Hacker, No    Brief Narrative:  [name] is a 87 year old male with past medical history significant for paroxysmal atrial fibrillation on Coumadin, chronic diastolic congestive heart failure, CKD stage IV, anemia due to CKD, HLD, hypothyroidism, depression, anxiety who presented to the ED with concerns of syncope and fall.  Patient was apparently trying to plug in his iPad but suddenly lost consciousness and fell backwards hitting his head on a bedside table.  Patient sustained laceration to his right posterior scalp.  Patient reports increasing dyspnea on exertion for the past 2-3 months with worsening chronic lower extremity edema.  Recently had his Lasix dose decreased due to worsening creatinine level.  Similar episode roughly 2 months ago when he was sitting up in bed.  Denies dizziness or lightheadedness.  No chest pain or palpitations.  In the ED, temperature 97.7 F, HR 80, RR 20, BP 142/76, SPO2 86% on 4 L nasal cannula.  Sodium 135, potassium 4.5, chloride 97, CO2 27, glucose 146, BUN 48, creatinine 2.55.  BNP 749.5.  Troponin 104>107.  WBC 7.8, hemoglobin 11.8, platelets 125.  Cova-19 PCR negative.  Influenza A/B PCR negative.  CT head/C-spine with no acute intracranial pathology, age-related atrophy and chronic microvascular ischemic changes, no acute/traumatic cervical spine pathology, partially visualized bilateral pleural effusions and findings of pulmonary edema.  Chest x-ray with progression of bilateral pulmonary densities, likely worsening fibrosis or sequela of prior inflammatory/infectious process. Pelvis x-ray negative.  EDP consulted hospital service for further evaluation management of acute on chronic diastolic congestive heart failure.   Assessment & Plan:   Principal Problem:   Acute CHF (congestive heart failure) (HCC) Active Problems:   Syncope and collapse   Acute  respiratory failure with hypoxia (HCC)   Acute-on-chronic kidney injury ([place])   Scalp laceration   Unspecified atrial fibrillation (HCC)   HLD (hyperlipidemia)   Hypothyroidism   Acute hypoxic respiratory failure, POA Acute on chronic diastolic congestive heart failure Patient presenting to the ED with progressive shortness of breath, lower extremity edema and syncopal event with recent decrease in home diuretic regimen.  Patient was noted to have pulmonary edema on CT scanning with elevated BNP.  Patient hypoxic on presentation with SPO2 86% on 4 L nasal cannula.  TTE with LVEF 40-45%, mild LV hypokinesis, moderate inferior lateral wall hypokinesis, mild concentric LVH, RV function mildly reduced, RA moderately dilated, mild MR/TR. --Cardiology following, appreciate assistance --net negative 417mL past 24h and net negative 2.4L since admission --wt 68>67.9>67.8kg --Furosemide IV transitioned to 40 mg PO daily today --Not on ACE inhibitor/ARB/ARNI due to renal dysfunction --Strict I's and O's Daily weights --Continue supplemental oxygen, maintain SPO2 greater than 92%, on 2L [place] w/ SpO2 96% --BMP daily --Home O2 screen at time of discharge  Syncope with loss of consciousness Suspect secondary to prolonged hypoxia from diastolic CHF as above.  TTE with mildly reduced LVEF, otherwise no significant valvular abnormalities.  No concerning arrhythmias or high-grade AV block noted on telemetry such far. --Continue to monitor for arrhythmias on telemetry --PT/OT recommend home health on discharge --Will likely need supplemental oxygen on discharge, O2 screen closer to dc  Acute renal failure on CKD stage IV Baseline creatinine 2.2 to 2.5.  On admission, creatinine elevated 2.55.  Etiology likely volume overload.   --Cr 2.55>2.26>2.08>2.01>1.83 --Continue lasix as above --Avoid nephrotoxins, renal dose all medications --BMP daily  Posterior scalp laceration Following syncopal  event at home,  patient sustained posterior scalp laceration.  Underwent skin approximation by EDP with staples on 12/31/2020. --Will need staple removal 7 days (5/31)  Hypothyroidism TSH 5.933, free T4 1.11.  --Will increase levothyroxine from 88 to 100 mcg p.o. daily -- Repeat TFTs 4-6 weeks  Permanent atrial fibrillation --Metoprolol succinate 25 mg p.o. daily --Continue Coumadin, pharmacy consulted for dosing/monitoring (INR 2.8 today w/ goal 2.0-3.0) --INR daily  Ascending aorta dilation Ascending aorta noted 45 mm on TTE 2016.  Not a surgical candidate given his age and comorbidities.  Patient showed normal aortic root but ascending aorta was not well visualized.  Given his advanced age and comorbidities, not a candidate for surgical repair per cardiology.  Hyperlipidemia: Simvastatin 10 mg p.o. daily  Hypothyroidism: Levothyroxine 88 mcg p.o. daily  Depression/anxiety --Sertraline 25 mg p.o. twice daily --Diazepam 2.5 mg p.o. every 6 hours as needed anxiety  GERD: Continue PPI  Constipation --Senokot 2 tabs p.o. twice daily --Magnesium citrate today   DVT prophylaxis: coumadin   Code Status: DNR Family Communication: updated patient's daughter [name] via telephone this afternoon.  Disposition Plan:  Level of care: Telemetry Cardiac Status is: Inpatient  Remains inpatient appropriate because:Ongoing diagnostic testing needed not appropriate for outpatient work up, Unsafe d/c plan, IV treatments appropriate due to intensity of illness or inability to take PO and Inpatient level of care appropriate due to severity of illness   Dispo: The patient is from: [facility]              Anticipated d/c is to: To be determined              Patient currently is not medically stable to d/c.   Difficult to place patient No    Consultants:   Cardiology  Procedures:   Scalp laceration repair with staples 12/31/2020  Antimicrobials:   None   Subjective: Patient seen examined at  bedside, resting comfortably.  Sitting at edge of bed.  No bowel movement past 2 days.  Seen by cardiology this morning with continued plan for transition from IV to oral furosemide.  No other questions or concerns at this time. Denies headache, no fever/chills/night sweats, no dizziness, no visual changes, no chest pain, no abdominal pain, no weakness, no fatigue, no paresthesias.  No acute events overnight per nurse staff.  Objective: Vitals:   01/04/21 0500 01/04/21 0547 01/04/21 0755 01/04/21 1149  BP:  98/62 124/68 120/76  Pulse: 70 92 (!) 110 81  Resp:  15 20 20   Temp:    97.9 F (36.6 C)  TempSrc:    Oral  SpO2: 93% 96%  100%  Weight:      Height:        Intake/Output Summary (Last 24 hours) at 01/04/2021 1412 Last data filed at 01/04/2021 0800 Gross per 24 hour  Intake 360 ml  Output 850 ml  Net -490 ml   Filed Weights   01/01/21 0015 01/03/21 0528 01/04/21 0244  Weight: 67.9 kg 67.8 kg 67.3 kg    Examination:  General exam: Appears calm and comfortable, elderly in appearance Respiratory system: Breath sounds slightly decreased bilateral bases with mild crackles, no wheezing, normal respiratory effort, on 2 L nasal cannula with SPO2 96% Cardiovascular system: S1 & S2 heard, irregularly irregular rhythm, normal rate. No JVD, murmurs, rubs, gallops or clicks.  1+ pitting edema bilateral lower extremities to mid shin Gastrointestinal system: Abdomen is nondistended, soft and nontender. No organomegaly or masses felt. Normal  bowel sounds heard. Central nervous system: Alert and oriented. No focal neurological deficits. Extremities: Symmetric 5 x 5 power. Skin: No rashes, lesions or ulcers Psychiatry: Judgement and insight appear normal. Mood & affect appropriate.     Data Reviewed: I have personally reviewed following labs and imaging studies  CBC: Recent Labs  Lab 12/31/20 1630 01/01/21 0213 01/04/21 0212  WBC 7.8 7.7 6.9  NEUTROABS 5.6  --   --   HGB 11.8* 10.8*  10.3*  HCT 37.1* 33.7* 32.2*  MCV 96.1 93.9 94.2  PLT 125* 115* 99*   Basic Metabolic Panel: Recent Labs  Lab 12/31/20 1630 01/01/21 0213 01/02/21 0301 01/03/21 0348 01/04/21 0212  NA 135 136 136 135 134*  K 4.5 4.3 4.0 3.6 3.7  CL 97* 101 102 99 97*  CO2 27 27 26 30 30   GLUCOSE 146* 112* 100* 93 102*  BUN 48* 47* 46* 46* 45*  CREATININE 2.55* 2.26* 2.08* 2.01* 1.83*  CALCIUM 8.9 8.6* 8.3* 8.1* 8.3*   GFR: Estimated Creatinine Clearance: 18.1 mL/min (A) (by C-G formula based on SCr of 1.83 mg/dL (H)). Liver Function Tests: Recent Labs  Lab 12/31/20 1630  AST 29  ALT 23  ALKPHOS 82  BILITOT 1.0  PROT 6.6  ALBUMIN 3.6   No results for input(s): LIPASE, AMYLASE in the last 168 hours. No results for input(s): AMMONIA in the last 168 hours. Coagulation Profile: Recent Labs  Lab 12/31/20 1630 01/01/21 0213 01/02/21 0301 01/03/21 0348 01/04/21 0212  INR 2.2* 2.2* 2.2* 2.6* 2.8*   Cardiac Enzymes: No results for input(s): CKTOTAL, CKMB, CKMBINDEX, TROPONINI in the last 168 hours. BNP (last 3 results) No results for input(s): PROBNP in the last 8760 hours. HbA1C: No results for input(s): HGBA1C in the last 72 hours. CBG: No results for input(s): GLUCAP in the last 168 hours. Lipid Profile: No results for input(s): CHOL, HDL, LDLCALC, TRIG, CHOLHDL, LDLDIRECT in the last 72 hours. Thyroid Function Tests: Recent Labs    01/03/21 1039 01/04/21 0717  TSH 5.933*  --   FREET4  --  1.11   Anemia Panel: No results for input(s): VITAMINB12, FOLATE, FERRITIN, TIBC, IRON, RETICCTPCT in the last 72 hours. Sepsis Labs: No results for input(s): PROCALCITON, LATICACIDVEN in the last 168 hours.  Recent Results (from the past 240 hour(s))  Resp Panel by RT-PCR (Flu A&B, Covid) Nasopharyngeal Swab     Status: None   Collection Time: 12/31/20  4:46 PM   Specimen: Nasopharyngeal Swab; Nasopharyngeal(NP) swabs in vial transport medium  Result Value Ref Range Status   SARS  Coronavirus 2 by RT PCR NEGATIVE NEGATIVE Final    Comment: (NOTE) SARS-CoV-2 target nucleic acids are NOT DETECTED.  The SARS-CoV-2 RNA is generally detectable in upper respiratory specimens during the acute phase of infection. The lowest concentration of SARS-CoV-2 viral copies this assay can detect is 138 copies/mL. A negative result does not preclude SARS-Cov-2 infection and should not be used as the sole basis for treatment or other patient management decisions. A negative result may occur with  improper specimen collection/handling, submission of specimen other than nasopharyngeal swab, presence of viral mutation(s) within the areas targeted by this assay, and inadequate number of viral copies(<138 copies/mL). A negative result must be combined with clinical observations, patient history, and epidemiological information. The expected result is Negative.  Fact Sheet for Patients:  EntrepreneurPulse.com.au  Fact Sheet for Healthcare Providers:  IncredibleEmployment.be  This test is no t yet approved or cleared by the [place]  States FDA and  has been authorized for detection and/or diagnosis of SARS-CoV-2 by FDA under an Emergency Use Authorization (EUA). This EUA will remain  in effect (meaning this test can be used) for the duration of the COVID-19 declaration under Section 564(b)(1) of the Act, 21 U.S.C.section 360bbb-3(b)(1), unless the authorization is terminated  or revoked sooner.       Influenza A by PCR NEGATIVE NEGATIVE Final   Influenza B by PCR NEGATIVE NEGATIVE Final    Comment: (NOTE) The Xpert Xpress SARS-CoV-2/FLU/RSV plus assay is intended as an aid in the diagnosis of influenza from Nasopharyngeal swab specimens and should not be used as a sole basis for treatment. Nasal washings and aspirates are unacceptable for Xpert Xpress SARS-CoV-2/FLU/RSV testing.  Fact Sheet for  Patients: EntrepreneurPulse.com.au  Fact Sheet for Healthcare Providers: IncredibleEmployment.be  This test is not yet approved or cleared by the [place] FDA and has been authorized for detection and/or diagnosis of SARS-CoV-2 by FDA under an Emergency Use Authorization (EUA). This EUA will remain in effect (meaning this test can be used) for the duration of the COVID-19 declaration under Section 564(b)(1) of the Act, 21 U.S.C. section 360bbb-3(b)(1), unless the authorization is terminated or revoked.  Performed at [facility], [place] [address], [place], [place] [address]          Radiology Studies: No results found.      Scheduled Meds: . cholecalciferol  1,000 Units Oral Daily  . furosemide  40 mg Oral Daily  . levothyroxine  88 mcg Oral QAC breakfast  . loratadine  10 mg Oral Daily  . mouth rinse  15 mL Mouth Rinse BID  . metoprolol succinate  25 mg Oral Daily  . pantoprazole  40 mg Oral Daily  . sertraline  25 mg Oral BID  . simvastatin  10 mg Oral Daily  . warfarin  1 mg Oral ONCE-1600  . Warfarin - Pharmacist Dosing Inpatient   Does not apply q1600   Continuous Infusions:   LOS: 4 days    Time spent: 36 minutes spent on chart review, discussion with nursing staff, consultants, updating family and interview/physical exam; more than 50% of that time was spent in counseling and/or coordination of care.    Zimir Kittleson J [place] DO [facility] Available via Epic secure chat 7am-7pm After these hours, please refer to coverage provider listed on [email or web address] 01/04/2021, 2:12 PM

## 2021-01-04 NOTE — Progress Notes (Signed)
ANTICOAGULATION CONSULT NOTE - Follow Up Consult  Pharmacy Consult for Warfarin Indication: atrial fibrillation  Allergies  Allergen Reactions  . Penicillins     Unknown    Patient Measurements: Height: 5' (152.4 cm) Weight: 67.3 kg (148 lb 5.9 oz) IBW/kg (Calculated) : 50  Vital Signs: BP: 124/68 (05/28 0755) Pulse Rate: 110 (05/28 0755)  Labs: Recent Labs    01/02/21 0301 01/03/21 0348 01/04/21 0212  HGB  --   --  10.3*  HCT  --   --  32.2*  PLT  --   --  99*  LABPROT 24.6* 27.7* 29.3*  INR 2.2* 2.6* 2.8*  CREATININE 2.08* 2.01* 1.83*    Estimated Creatinine Clearance: 18.1 mL/min (A) (by C-G formula based on SCr of 1.83 mg/dL (H)).  Assessment: 87 yr old male admitted 5/24 after syncope, fall and LOC. Noted to have hit the back of his head and has scalp laceration, stapled.  No bleeding per CT head. Continues on Warfarin as PTA for atrial fibrillation.    INR remains therapeutic at 2.8 but trending up. PLTs low. No bleeding noted.  PTA warfarin regimen per facility MAR: 1 mg MWF, 1.5 mg TTSS (9 mg weekly), since 11/09/20.  Previously on 1.25 mg daily (8.75 mg weekly).  Goal of Therapy:  INR 2-3 Monitor platelets by anticoagulation protocol: Yes   Plan:  Warfarin 1 mg x1 Daily PT/INR Monitor for s/sx bleeding.  Romilda Garret, PharmD PGY1 Acute Care Pharmacy Resident 01/04/2021 8:18 AM  Please check [email or web address] for unit specific pharmacy phone numbers.

## 2021-01-05 DIAGNOSIS — N170 Acute kidney failure with tubular necrosis: Secondary | ICD-10-CM | POA: Diagnosis not present

## 2021-01-05 DIAGNOSIS — J9601 Acute respiratory failure with hypoxia: Secondary | ICD-10-CM | POA: Diagnosis not present

## 2021-01-05 DIAGNOSIS — I5041 Acute combined systolic (congestive) and diastolic (congestive) heart failure: Secondary | ICD-10-CM | POA: Diagnosis not present

## 2021-01-05 DIAGNOSIS — E785 Hyperlipidemia, unspecified: Secondary | ICD-10-CM | POA: Diagnosis not present

## 2021-01-05 LAB — CBC
HCT: 31.4 % — ABNORMAL LOW (ref 39.0–52.0)
Hemoglobin: 9.9 g/dL — ABNORMAL LOW (ref 13.0–17.0)
MCH: 29.8 pg (ref 26.0–34.0)
MCHC: 31.5 g/dL (ref 30.0–36.0)
MCV: 94.6 fL (ref 80.0–100.0)
Platelets: 102 10*3/uL — ABNORMAL LOW (ref 150–400)
RBC: 3.32 MIL/uL — ABNORMAL LOW (ref 4.22–5.81)
RDW: 16.6 % — ABNORMAL HIGH (ref 11.5–15.5)
WBC: 6.1 10*3/uL (ref 4.0–10.5)
nRBC: 0 % (ref 0.0–0.2)

## 2021-01-05 LAB — BASIC METABOLIC PANEL
Anion gap: 6 (ref 5–15)
BUN: 45 mg/dL — ABNORMAL HIGH (ref 8–23)
CO2: 31 mmol/L (ref 22–32)
Calcium: 8.4 mg/dL — ABNORMAL LOW (ref 8.9–10.3)
Chloride: 98 mmol/L (ref 98–111)
Creatinine, Ser: 1.93 mg/dL — ABNORMAL HIGH (ref 0.61–1.24)
GFR, Estimated: 31 mL/min — ABNORMAL LOW (ref >60)
Glucose, Bld: 98 mg/dL (ref 70–99)
Potassium: 3.8 mmol/L (ref 3.5–5.1)
Sodium: 135 mmol/L (ref 135–145)

## 2021-01-05 LAB — PROTIME-INR
INR: 2.9 — ABNORMAL HIGH (ref 0.8–1.2)
Prothrombin Time: 30.3 seconds — ABNORMAL HIGH (ref 11.4–15.2)

## 2021-01-05 MED ORDER — WARFARIN SODIUM 1 MG PO TABS
1.0000 mg | ORAL_TABLET | Freq: Once | ORAL | Status: AC
  Administered 2021-01-05: 1 mg via ORAL
  Filled 2021-01-05: qty 1

## 2021-01-05 NOTE — Progress Notes (Signed)
PROGRESS NOTE    [name]  BWL:[id number] DOB: 1920/06/19 DOA: 12/31/2020 PCP: Merryl Hacker, No    Brief Narrative:  [name] is a 87 year old male with past medical history significant for paroxysmal atrial fibrillation on Coumadin, chronic diastolic congestive heart failure, CKD stage IV, anemia due to CKD, HLD, hypothyroidism, depression, anxiety who presented to the ED with concerns of syncope and fall.  Patient was apparently trying to plug in his iPad but suddenly lost consciousness and fell backwards hitting his head on a bedside table.  Patient sustained laceration to his right posterior scalp.  Patient reports increasing dyspnea on exertion for the past 2-3 months with worsening chronic lower extremity edema.  Recently had his Lasix dose decreased due to worsening creatinine level.  Similar episode roughly 2 months ago when he was sitting up in bed.  Denies dizziness or lightheadedness.  No chest pain or palpitations.  In the ED, temperature 97.7 F, HR 80, RR 20, BP 142/76, SPO2 86% on 4 L nasal cannula.  Sodium 135, potassium 4.5, chloride 97, CO2 27, glucose 146, BUN 48, creatinine 2.55.  BNP 749.5.  Troponin 104>107.  WBC 7.8, hemoglobin 11.8, platelets 125.  Cova-19 PCR negative.  Influenza A/B PCR negative.  CT head/C-spine with no acute intracranial pathology, age-related atrophy and chronic microvascular ischemic changes, no acute/traumatic cervical spine pathology, partially visualized bilateral pleural effusions and findings of pulmonary edema.  Chest x-ray with progression of bilateral pulmonary densities, likely worsening fibrosis or sequela of prior inflammatory/infectious process. Pelvis x-ray negative.  EDP consulted hospital service for further evaluation management of acute on chronic diastolic congestive heart failure.   Assessment & Plan:   Principal Problem:   Acute CHF (congestive heart failure) (HCC) Active Problems:   Syncope and collapse   Acute  respiratory failure with hypoxia (HCC)   Acute-on-chronic kidney injury ([place])   Scalp laceration   Unspecified atrial fibrillation (HCC)   HLD (hyperlipidemia)   Hypothyroidism   Acute hypoxic respiratory failure, POA Acute on chronic comdiastolic congestive heart failure Patient presenting to the ED with progressive shortness of breath, lower extremity edema and syncopal event with recent decrease in home diuretic regimen.  Patient was noted to have pulmonary edema on CT scanning with elevated BNP.  Patient hypoxic on presentation with SPO2 86% on 4 L nasal cannula.  TTE with LVEF 40-45%, mild LV hypokinesis, moderate inferior lateral wall hypokinesis, mild concentric LVH, RV function mildly reduced, RA moderately dilated, mild MR/TR. --Cardiology following, appreciate assistance --net negative 759mL past 24h and net negative 3.2L since admission --wt 68>67.9>67.8>67.3kg --Furosemide IV transitioned to 40 mg PO daily on 5/28 --Not on ACE inhibitor/ARB/ARNI due to renal dysfunction --Strict I's and O's Daily weights --Continue supplemental oxygen, maintain SPO2 greater than 92%, on 2L [place] w/ SpO2 96% --BMP daily --Patient desaturated to 85% while ambulating on room air today, recovered on 2 L nasal cannula.  Will need home O2.  TOC for home O2 needs  Syncope with loss of consciousness Suspect secondary to prolonged hypoxia from diastolic CHF as above.  TTE with mildly reduced LVEF, otherwise no significant valvular abnormalities.  No concerning arrhythmias or high-grade AV block noted on telemetry such far. --Continue to monitor for arrhythmias on telemetry --PT/OT recommend home health on discharge --Will  need supplemental oxygen on discharge  Acute renal failure on CKD stage IV Baseline creatinine 2.2 to 2.5.  On admission, creatinine elevated 2.55.  Etiology likely volume overload.   --Cr 2.55>2.26>2.08>2.01>1.83>1.93 --Continue lasix  as above --Avoid nephrotoxins, renal dose all  medications --BMP daily  Posterior scalp laceration Following syncopal event at home, patient sustained posterior scalp laceration.  Underwent skin approximation by EDP with staples on 12/31/2020. --Will need staple removal 7 days (5/31)  Hypothyroidism TSH 5.933, free T4 1.11.  --Increased levothyroxine from 88 to 100 mcg p.o. daily --Repeat TFTs 4-6 weeks  Permanent atrial fibrillation --Metoprolol succinate 25 mg p.o. daily --Continue Coumadin, pharmacy consulted for dosing/monitoring (INR 2.9 today w/ goal 2.0-3.0) --INR daily  Ascending aorta dilation Ascending aorta noted 45 mm on TTE 2016.  Not a surgical candidate given his age and comorbidities.  Patient showed normal aortic root but ascending aorta was not well visualized.  Given his advanced age and comorbidities, not a candidate for surgical repair per cardiology.  Hyperlipidemia: Simvastatin 10 mg p.o. daily  Depression/anxiety --Sertraline 25 mg p.o. twice daily --Diazepam 2.5 mg p.o. every 6 hours as needed anxiety  GERD: Continue PPI  Constipation Patient reports bowel movement yesterday after receiving magnesium citrate --Senokot 2 tabs p.o. twice daily   DVT prophylaxis: coumadin   Code Status: DNR Family Communication: updated patient's daughter [name] via telephone this afternoon.  Disposition Plan:  Level of care: Telemetry Cardiac Status is: Inpatient  Remains inpatient appropriate because:Ongoing diagnostic testing needed not appropriate for outpatient work up, Unsafe d/c plan, IV treatments appropriate due to intensity of illness or inability to take PO and Inpatient level of care appropriate due to severity of illness   Dispo: The patient is from: [place]              Anticipated d/c is to: To be determined              Patient currently is not medically stable to d/c.   Difficult to place patient No    Consultants:   Cardiology  Procedures:   Scalp laceration repair with  staples 12/31/2020  Antimicrobials:   None   Subjective: Patient seen examined at bedside, resting comfortably.  Sitting at edge of bed, eating breakfast.  No specific complaints this morning.  Patient reports shortness of breath while ambulating on room air, notable for desaturation to 85% with good recovery on 2 L nasal cannula.  Will need home O2 on discharge. Denies headache, no fever/chills/night sweats, no dizziness, no visual changes, no chest pain, no abdominal pain, no weakness, no fatigue, no paresthesias.  No acute events overnight per nurse staff.  Objective: Vitals:   01/04/21 2010 01/05/21 0515 01/05/21 0915 01/05/21 1213  BP: 123/64 134/90 121/70 120/89  Pulse: 92 (!) 107 97 93  Resp: 17 17 18 20   Temp: 98.7 F (37.1 C) 98 F (36.7 C)  (!) 97.5 F (36.4 C)  TempSrc: Oral Oral  Oral  SpO2: 99% 96% 97% 98%  Weight:  67.3 kg    Height:        Intake/Output Summary (Last 24 hours) at 01/05/2021 1339 Last data filed at 01/05/2021 1300 Gross per 24 hour  Intake 960 ml  Output 1551 ml  Net -591 ml   Filed Weights   01/03/21 0528 01/04/21 0244 01/05/21 0515  Weight: 67.8 kg 67.3 kg 67.3 kg    Examination:  General exam: Appears calm and comfortable, elderly in appearance Respiratory system: Breath sounds slightly decreased bilateral bases with mild crackles, no wheezing, normal respiratory effort, on 2 L nasal cannula with SPO2 96% Cardiovascular system: S1 & S2 heard, irregularly irregular rhythm, normal rate. No JVD, murmurs,  rubs, gallops or clicks.  1+ pitting edema bilateral lower extremities to mid shin Gastrointestinal system: Abdomen is nondistended, soft and nontender. No organomegaly or masses felt. Normal bowel sounds heard. Central nervous system: Alert and oriented. No focal neurological deficits. Extremities: Symmetric 5 x 5 power. Skin: No rashes, lesions or ulcers Psychiatry: Judgement and insight appear normal. Mood & affect appropriate.      Data Reviewed: I have personally reviewed following labs and imaging studies  CBC: Recent Labs  Lab 12/31/20 1630 01/01/21 0213 01/04/21 0212 01/05/21 0639  WBC 7.8 7.7 6.9 6.1  NEUTROABS 5.6  --   --   --   HGB 11.8* 10.8* 10.3* 9.9*  HCT 37.1* 33.7* 32.2* 31.4*  MCV 96.1 93.9 94.2 94.6  PLT 125* 115* 99* 893*   Basic Metabolic Panel: Recent Labs  Lab 01/01/21 0213 01/02/21 0301 01/03/21 0348 01/04/21 0212 01/05/21 0639  NA 136 136 135 134* 135  K 4.3 4.0 3.6 3.7 3.8  CL 101 102 99 97* 98  CO2 27 26 30 30 31   GLUCOSE 112* 100* 93 102* 98  BUN 47* 46* 46* 45* 45*  CREATININE 2.26* 2.08* 2.01* 1.83* 1.93*  CALCIUM 8.6* 8.3* 8.1* 8.3* 8.4*   GFR: Estimated Creatinine Clearance: 17.2 mL/min (A) (by C-G formula based on SCr of 1.93 mg/dL (H)). Liver Function Tests: Recent Labs  Lab 12/31/20 1630  AST 29  ALT 23  ALKPHOS 82  BILITOT 1.0  PROT 6.6  ALBUMIN 3.6   No results for input(s): LIPASE, AMYLASE in the last 168 hours. No results for input(s): AMMONIA in the last 168 hours. Coagulation Profile: Recent Labs  Lab 01/01/21 0213 01/02/21 0301 01/03/21 0348 01/04/21 0212 01/05/21 0639  INR 2.2* 2.2* 2.6* 2.8* 2.9*   Cardiac Enzymes: No results for input(s): CKTOTAL, CKMB, CKMBINDEX, TROPONINI in the last 168 hours. BNP (last 3 results) No results for input(s): PROBNP in the last 8760 hours. HbA1C: No results for input(s): HGBA1C in the last 72 hours. CBG: No results for input(s): GLUCAP in the last 168 hours. Lipid Profile: No results for input(s): CHOL, HDL, LDLCALC, TRIG, CHOLHDL, LDLDIRECT in the last 72 hours. Thyroid Function Tests: Recent Labs    01/03/21 1039 01/04/21 0717  TSH 5.933*  --   FREET4  --  1.11   Anemia Panel: No results for input(s): VITAMINB12, FOLATE, FERRITIN, TIBC, IRON, RETICCTPCT in the last 72 hours. Sepsis Labs: No results for input(s): PROCALCITON, LATICACIDVEN in the last 168 hours.  Recent Results  (from the past 240 hour(s))  Resp Panel by RT-PCR (Flu A&B, Covid) Nasopharyngeal Swab     Status: None   Collection Time: 12/31/20  4:46 PM   Specimen: Nasopharyngeal Swab; Nasopharyngeal(NP) swabs in vial transport medium  Result Value Ref Range Status   SARS Coronavirus 2 by RT PCR NEGATIVE NEGATIVE Final    Comment: (NOTE) SARS-CoV-2 target nucleic acids are NOT DETECTED.  The SARS-CoV-2 RNA is generally detectable in upper respiratory specimens during the acute phase of infection. The lowest concentration of SARS-CoV-2 viral copies this assay can detect is 138 copies/mL. A negative result does not preclude SARS-Cov-2 infection and should not be used as the sole basis for treatment or other patient management decisions. A negative result may occur with  improper specimen collection/handling, submission of specimen other than nasopharyngeal swab, presence of viral mutation(s) within the areas targeted by this assay, and inadequate number of viral copies(<138 copies/mL). A negative result must be combined with clinical observations,  patient history, and epidemiological information. The expected result is Negative.  Fact Sheet for Patients:  EntrepreneurPulse.com.au  Fact Sheet for Healthcare Providers:  IncredibleEmployment.be  This test is no t yet approved or cleared by the [place] FDA and  has been authorized for detection and/or diagnosis of SARS-CoV-2 by FDA under an Emergency Use Authorization (EUA). This EUA will remain  in effect (meaning this test can be used) for the duration of the COVID-19 declaration under Section 564(b)(1) of the Act, 21 U.S.C.section 360bbb-3(b)(1), unless the authorization is terminated  or revoked sooner.       Influenza A by PCR NEGATIVE NEGATIVE Final   Influenza B by PCR NEGATIVE NEGATIVE Final    Comment: (NOTE) The Xpert Xpress SARS-CoV-2/FLU/RSV plus assay is intended as an aid in the  diagnosis of influenza from Nasopharyngeal swab specimens and should not be used as a sole basis for treatment. Nasal washings and aspirates are unacceptable for Xpert Xpress SARS-CoV-2/FLU/RSV testing.  Fact Sheet for Patients: EntrepreneurPulse.com.au  Fact Sheet for Healthcare Providers: IncredibleEmployment.be  This test is not yet approved or cleared by the [place] FDA and has been authorized for detection and/or diagnosis of SARS-CoV-2 by FDA under an Emergency Use Authorization (EUA). This EUA will remain in effect (meaning this test can be used) for the duration of the COVID-19 declaration under Section 564(b)(1) of the Act, 21 U.S.C. section 360bbb-3(b)(1), unless the authorization is terminated or revoked.  Performed at [facility], [place] [address], [place], [place] [address]          Radiology Studies: No results found.      Scheduled Meds: . cholecalciferol  1,000 Units Oral Daily  . furosemide  40 mg Oral Daily  . levothyroxine  100 mcg Oral Q0600  . loratadine  10 mg Oral Daily  . mouth rinse  15 mL Mouth Rinse BID  . metoprolol succinate  25 mg Oral Daily  . pantoprazole  40 mg Oral Daily  . senna-docusate  2 tablet Oral BID  . sertraline  25 mg Oral BID  . simvastatin  10 mg Oral Daily  . warfarin  1 mg Oral ONCE-1600  . Warfarin - Pharmacist Dosing Inpatient   Does not apply q1600   Continuous Infusions:   LOS: 5 days    Time spent: 36 minutes spent on chart review, discussion with nursing staff, consultants, updating family and interview/physical exam; more than 50% of that time was spent in counseling and/or coordination of care.    Khyran Riera J [place] DO [facility] Available via Epic secure chat 7am-7pm After these hours, please refer to coverage provider listed on [email or web address] 01/05/2021, 1:39 PM

## 2021-01-05 NOTE — Progress Notes (Signed)
ANTICOAGULATION CONSULT NOTE - Follow Up Consult  Pharmacy Consult for Warfarin Indication: atrial fibrillation  Allergies  Allergen Reactions  . Penicillins     Unknown    Patient Measurements: Height: 5' (152.4 cm) Weight: 67.3 kg (148 lb 4.8 oz) IBW/kg (Calculated) : 50  Vital Signs: Temp: 98 F (36.7 C) (05/29 0515) Temp Source: Oral (05/29 0515) BP: 134/90 (05/29 0515) Pulse Rate: 107 (05/29 0515)  Labs: Recent Labs    01/03/21 0348 01/04/21 0212 01/05/21 0639  HGB  --  10.3* 9.9*  HCT  --  32.2* 31.4*  PLT  --  99* 102*  LABPROT 27.7* 29.3* 30.3*  INR 2.6* 2.8* 2.9*  CREATININE 2.01* 1.83* 1.93*    Estimated Creatinine Clearance: 17.2 mL/min (A) (by C-G formula based on SCr of 1.93 mg/dL (H)).  Assessment: 87 yr old male admitted 5/24 after syncope, fall and LOC. Noted to have hit the back of his head and has scalp laceration, stapled.  No bleeding per CT head. Continues on Warfarin as PTA for atrial fibrillation.    INR remains therapeutic at 2.9 but trending up. PLTs low but stable. No bleeding noted.  PTA warfarin regimen per facility MAR: 1 mg MWF, 1.5 mg TTSS (9 mg weekly), since 11/09/20.  Previously on 1.25 mg daily (8.75 mg weekly).  Goal of Therapy:  INR 2-3 Monitor platelets by anticoagulation protocol: Yes   Plan:  Warfarin 1 mg x1 Daily PT/INR Monitor for s/sx bleeding.  Romilda Garret, PharmD PGY1 Acute Care Pharmacy Resident 01/05/2021 8:13 AM  Please check [email or web address] for unit specific pharmacy phone numbers.

## 2021-01-05 NOTE — Progress Notes (Signed)
SATURATION QUALIFICATIONS: (This note is used to comply with regulatory documentation for home oxygen)  Patient Saturations on Room Air at Rest = 91%  Patient Saturations on Room Air while Ambulating = 85%  Patient Saturations on 2 Liters of oxygen while Ambulating = 90%  Please briefly explain why patient needs home oxygen: pt ambulated 48ft before stating was feeling short of breath. Pt was placed on 2l [place] while ambulating with oxygen saturation at 90%. Pt is currently sitting in bed on 2L [place] at 94% oxygen saturation.

## 2021-01-06 DIAGNOSIS — I5041 Acute combined systolic (congestive) and diastolic (congestive) heart failure: Secondary | ICD-10-CM | POA: Diagnosis not present

## 2021-01-06 DIAGNOSIS — E039 Hypothyroidism, unspecified: Secondary | ICD-10-CM | POA: Diagnosis not present

## 2021-01-06 DIAGNOSIS — N170 Acute kidney failure with tubular necrosis: Secondary | ICD-10-CM | POA: Diagnosis not present

## 2021-01-06 DIAGNOSIS — J9601 Acute respiratory failure with hypoxia: Secondary | ICD-10-CM | POA: Diagnosis not present

## 2021-01-06 LAB — BASIC METABOLIC PANEL
Anion gap: 6 (ref 5–15)
BUN: 47 mg/dL — ABNORMAL HIGH (ref 8–23)
CO2: 32 mmol/L (ref 22–32)
Calcium: 8.6 mg/dL — ABNORMAL LOW (ref 8.9–10.3)
Chloride: 96 mmol/L — ABNORMAL LOW (ref 98–111)
Creatinine, Ser: 1.96 mg/dL — ABNORMAL HIGH (ref 0.61–1.24)
GFR, Estimated: 30 mL/min — ABNORMAL LOW (ref >60)
Glucose, Bld: 103 mg/dL — ABNORMAL HIGH (ref 70–99)
Potassium: 4 mmol/L (ref 3.5–5.1)
Sodium: 134 mmol/L — ABNORMAL LOW (ref 135–145)

## 2021-01-06 LAB — PROTIME-INR
INR: 2.7 — ABNORMAL HIGH (ref 0.8–1.2)
Prothrombin Time: 29 seconds — ABNORMAL HIGH (ref 11.4–15.2)

## 2021-01-06 LAB — RESP PANEL BY RT-PCR (FLU A&B, COVID) ARPGX2
Influenza A by PCR: NEGATIVE
Influenza B by PCR: NEGATIVE
SARS Coronavirus 2 by RT PCR: NEGATIVE

## 2021-01-06 LAB — SARS CORONAVIRUS 2 (TAT 6-24 HRS): SARS Coronavirus 2: POSITIVE — AB

## 2021-01-06 MED ORDER — WARFARIN SODIUM 1 MG PO TABS
1.0000 mg | ORAL_TABLET | Freq: Once | ORAL | Status: AC
  Administered 2021-01-06: 1 mg via ORAL
  Filled 2021-01-06: qty 1

## 2021-01-06 MED ORDER — SENNOSIDES-DOCUSATE SODIUM 8.6-50 MG PO TABS
1.0000 | ORAL_TABLET | Freq: Every evening | ORAL | Status: DC | PRN
  Administered 2021-01-07: 1 via ORAL
  Filled 2021-01-06 (×2): qty 1

## 2021-01-06 NOTE — Progress Notes (Signed)
PROGRESS NOTE    [name]  HCW:[id number] DOB: September 06, 1919 DOA: 12/31/2020 PCP: Merryl Hacker, No    Brief Narrative:  [name] is a 87 year old male with past medical history significant for paroxysmal atrial fibrillation on Coumadin, chronic diastolic congestive heart failure, CKD stage IV, anemia due to CKD, HLD, hypothyroidism, depression, anxiety who presented to the ED with concerns of syncope and fall.  Patient was apparently trying to plug in his iPad but suddenly lost consciousness and fell backwards hitting his head on a bedside table.  Patient sustained laceration to his right posterior scalp.  Patient reports increasing dyspnea on exertion for the past 2-3 months with worsening chronic lower extremity edema.  Recently had his Lasix dose decreased due to worsening creatinine level.  Similar episode roughly 2 months ago when he was sitting up in bed.  Denies dizziness or lightheadedness.  No chest pain or palpitations.  In the ED, temperature 97.7 F, HR 80, RR 20, BP 142/76, SPO2 86% on 4 L nasal cannula.  Sodium 135, potassium 4.5, chloride 97, CO2 27, glucose 146, BUN 48, creatinine 2.55.  BNP 749.5.  Troponin 104>107.  WBC 7.8, hemoglobin 11.8, platelets 125.  Cova-19 PCR negative.  Influenza A/B PCR negative.  CT head/C-spine with no acute intracranial pathology, age-related atrophy and chronic microvascular ischemic changes, no acute/traumatic cervical spine pathology, partially visualized bilateral pleural effusions and findings of pulmonary edema.  Chest x-ray with progression of bilateral pulmonary densities, likely worsening fibrosis or sequela of prior inflammatory/infectious process. Pelvis x-ray negative.  EDP consulted hospital service for further evaluation management of acute on chronic diastolic congestive heart failure.   Assessment & Plan:   Principal Problem:   Acute CHF (congestive heart failure) (HCC) Active Problems:   Syncope and collapse   Acute  respiratory failure with hypoxia (HCC)   Acute-on-chronic kidney injury ([place])   Scalp laceration   Unspecified atrial fibrillation (HCC)   HLD (hyperlipidemia)   Hypothyroidism   Acute hypoxic respiratory failure, POA Acute on chronic comdiastolic congestive heart failure Patient presenting to the ED with progressive shortness of breath, lower extremity edema and syncopal event with recent decrease in home diuretic regimen.  Patient was noted to have pulmonary edema on CT scanning with elevated BNP.  Patient hypoxic on presentation with SPO2 86% on 4 L nasal cannula.  TTE with LVEF 40-45%, mild LV hypokinesis, moderate inferior lateral wall hypokinesis, mild concentric LVH, RV function mildly reduced, RA moderately dilated, mild MR/TR. --Cardiology following, appreciate assistance --net negative 367mL past 24h and net negative 3.5L since admission --wt 68>67.9>67.8>67.3kg --Furosemide IV transitioned to 40 mg PO daily on 5/28 --Not on ACE inhibitor/ARB/ARNI due to renal dysfunction --Strict I's and O's Daily weights --Continue supplemental oxygen, maintain SPO2 greater than 92%, on 2L [place] w/ SpO2 96% --BMP daily --Patient desaturated to 85% while ambulating on room air, recovered on 2 L nasal cannula.  Will need home O2.  TOC for home O2 needs  Syncope with loss of consciousness Suspect secondary to prolonged hypoxia from diastolic CHF as above.  TTE with mildly reduced LVEF, otherwise no significant valvular abnormalities.  No concerning arrhythmias or high-grade AV block noted on telemetry such far. --Continue to monitor for arrhythmias on telemetry --PT/OT recommend home health on discharge --Will  need supplemental oxygen on discharge  Acute renal failure on CKD stage IV Baseline creatinine 2.2 to 2.5.  On admission, creatinine elevated 2.55.  Etiology likely volume overload.   --Cr 2.55>2.26>2.08>2.01>1.83>1.93>1.96 --Continue lasix as  above --Avoid nephrotoxins, renal dose all  medications --BMP daily  Posterior scalp laceration Following syncopal event at home, patient sustained posterior scalp laceration.  Underwent skin approximation by EDP with staples on 12/31/2020. --Will need staple removal 7 days (5/31)  Hypothyroidism TSH 5.933, free T4 1.11.  --Increased levothyroxine from 88 to 100 mcg p.o. daily --Repeat TFTs 4-6 weeks  Permanent atrial fibrillation --Metoprolol succinate 25 mg p.o. daily --Continue Coumadin, pharmacy consulted for dosing/monitoring (INR 2.7 today w/ goal 2.0-3.0) --INR daily  Ascending aorta dilation Ascending aorta noted 45 mm on TTE 2016.  Not a surgical candidate given his age and comorbidities.  Patient showed normal aortic root but ascending aorta was not well visualized.  Given his advanced age and comorbidities, not a candidate for surgical repair per cardiology.  Hyperlipidemia: Simvastatin 10 mg p.o. daily  Depression/anxiety --Sertraline 25 mg p.o. twice daily --Diazepam 2.5 mg p.o. every 6 hours as needed anxiety  GERD: Continue PPI  Constipation: Resolved Patient reports bowel movement yesterday after receiving magnesium citrate --Senokot prn   DVT prophylaxis: coumadin   Code Status: DNR Family Communication: updated patient's daughter [name] via telephone this morning.  Disposition Plan:  Level of care: Telemetry Cardiac Status is: Inpatient  Remains inpatient appropriate because:Ongoing diagnostic testing needed not appropriate for outpatient work up, Unsafe d/c plan, IV treatments appropriate due to intensity of illness or inability to take PO and Inpatient level of care appropriate due to severity of illness   Dispo: The patient is from: [place]              Anticipated d/c is to: To be determined              Patient currently is not medically stable to d/c.   Difficult to place patient No    Consultants:   Cardiology  Procedures:   Scalp laceration repair with staples  12/31/2020  Antimicrobials:   None   Subjective: Patient seen examined at bedside, resting comfortably.  Sitting at edge of bed, eating breakfast.  No specific complaints this morning. Denies headache, no fever/chills/night sweats, no dizziness, no visual changes, no chest pain, no abdominal pain, no weakness, no fatigue, no paresthesias.  No acute events overnight per nurse staff.  Objective: Vitals:   01/05/21 2143 01/06/21 0500 01/06/21 0602 01/06/21 0830  BP: (!) 114/57  114/60 114/63  Pulse: 90  96 90  Resp: 18  16 18   Temp: 98.2 F (36.8 C)  98 F (36.7 C) 98.3 F (36.8 C)  TempSrc: Oral  Oral Oral  SpO2: 98%  96% 98%  Weight:  67.1 kg    Height:        Intake/Output Summary (Last 24 hours) at 01/06/2021 1007 Last data filed at 01/06/2021 0819 Gross per 24 hour  Intake 720 ml  Output 1351 ml  Net -631 ml   Filed Weights   01/04/21 0244 01/05/21 0515 01/06/21 0500  Weight: 67.3 kg 67.3 kg 67.1 kg    Examination:  General exam: Appears calm and comfortable, elderly in appearance Respiratory system: Breath sounds slightly decreased bilateral bases with mild crackles, no wheezing, normal respiratory effort, on 2 L nasal cannula with SPO2 96% Cardiovascular system: S1 & S2 heard, irregularly irregular rhythm, normal rate. No JVD, murmurs, rubs, gallops or clicks.  1+ pitting edema bilateral lower extremities to mid shin Gastrointestinal system: Abdomen is nondistended, soft and nontender. No organomegaly or masses felt. Normal bowel sounds heard. Central nervous system: Alert and  oriented. No focal neurological deficits. Extremities: Symmetric 5 x 5 power. Skin: No rashes, lesions or ulcers Psychiatry: Judgement and insight appear normal. Mood & affect appropriate.     Data Reviewed: I have personally reviewed following labs and imaging studies  CBC: Recent Labs  Lab 12/31/20 1630 01/01/21 0213 01/04/21 0212 01/05/21 0639  WBC 7.8 7.7 6.9 6.1  NEUTROABS 5.6   --   --   --   HGB 11.8* 10.8* 10.3* 9.9*  HCT 37.1* 33.7* 32.2* 31.4*  MCV 96.1 93.9 94.2 94.6  PLT 125* 115* 99* 191*   Basic Metabolic Panel: Recent Labs  Lab 01/02/21 0301 01/03/21 0348 01/04/21 0212 01/05/21 0639 01/06/21 0215  NA 136 135 134* 135 134*  K 4.0 3.6 3.7 3.8 4.0  CL 102 99 97* 98 96*  CO2 26 30 30 31  32  GLUCOSE 100* 93 102* 98 103*  BUN 46* 46* 45* 45* 47*  CREATININE 2.08* 2.01* 1.83* 1.93* 1.96*  CALCIUM 8.3* 8.1* 8.3* 8.4* 8.6*   GFR: Estimated Creatinine Clearance: 16.9 mL/min (A) (by C-G formula based on SCr of 1.96 mg/dL (H)). Liver Function Tests: Recent Labs  Lab 12/31/20 1630  AST 29  ALT 23  ALKPHOS 82  BILITOT 1.0  PROT 6.6  ALBUMIN 3.6   No results for input(s): LIPASE, AMYLASE in the last 168 hours. No results for input(s): AMMONIA in the last 168 hours. Coagulation Profile: Recent Labs  Lab 01/02/21 0301 01/03/21 0348 01/04/21 0212 01/05/21 0639 01/06/21 0215  INR 2.2* 2.6* 2.8* 2.9* 2.7*   Cardiac Enzymes: No results for input(s): CKTOTAL, CKMB, CKMBINDEX, TROPONINI in the last 168 hours. BNP (last 3 results) No results for input(s): PROBNP in the last 8760 hours. HbA1C: No results for input(s): HGBA1C in the last 72 hours. CBG: No results for input(s): GLUCAP in the last 168 hours. Lipid Profile: No results for input(s): CHOL, HDL, LDLCALC, TRIG, CHOLHDL, LDLDIRECT in the last 72 hours. Thyroid Function Tests: Recent Labs    01/03/21 1039 01/04/21 0717  TSH 5.933*  --   FREET4  --  1.11   Anemia Panel: No results for input(s): VITAMINB12, FOLATE, FERRITIN, TIBC, IRON, RETICCTPCT in the last 72 hours. Sepsis Labs: No results for input(s): PROCALCITON, LATICACIDVEN in the last 168 hours.  Recent Results (from the past 240 hour(s))  Resp Panel by RT-PCR (Flu A&B, Covid) Nasopharyngeal Swab     Status: None   Collection Time: 12/31/20  4:46 PM   Specimen: Nasopharyngeal Swab; Nasopharyngeal(NP) swabs in vial  transport medium  Result Value Ref Range Status   SARS Coronavirus 2 by RT PCR NEGATIVE NEGATIVE Final    Comment: (NOTE) SARS-CoV-2 target nucleic acids are NOT DETECTED.  The SARS-CoV-2 RNA is generally detectable in upper respiratory specimens during the acute phase of infection. The lowest concentration of SARS-CoV-2 viral copies this assay can detect is 138 copies/mL. A negative result does not preclude SARS-Cov-2 infection and should not be used as the sole basis for treatment or other patient management decisions. A negative result may occur with  improper specimen collection/handling, submission of specimen other than nasopharyngeal swab, presence of viral mutation(s) within the areas targeted by this assay, and inadequate number of viral copies(<138 copies/mL). A negative result must be combined with clinical observations, patient history, and epidemiological information. The expected result is Negative.  Fact Sheet for Patients:  EntrepreneurPulse.com.au  Fact Sheet for Healthcare Providers:  IncredibleEmployment.be  This test is no t yet approved or cleared by  the [employer] and  has been authorized for detection and/or diagnosis of SARS-CoV-2 by FDA under an Emergency Use Authorization (EUA). This EUA will remain  in effect (meaning this test can be used) for the duration of the COVID-19 declaration under Section 564(b)(1) of the Act, 21 U.S.C.section 360bbb-3(b)(1), unless the authorization is terminated  or revoked sooner.       Influenza A by PCR NEGATIVE NEGATIVE Final   Influenza B by PCR NEGATIVE NEGATIVE Final    Comment: (NOTE) The Xpert Xpress SARS-CoV-2/FLU/RSV plus assay is intended as an aid in the diagnosis of influenza from Nasopharyngeal swab specimens and should not be used as a sole basis for treatment. Nasal washings and aspirates are unacceptable for Xpert Xpress SARS-CoV-2/FLU/RSV testing.  Fact  Sheet for Patients: EntrepreneurPulse.com.au  Fact Sheet for Healthcare Providers: IncredibleEmployment.be  This test is not yet approved or cleared by the [place] FDA and has been authorized for detection and/or diagnosis of SARS-CoV-2 by FDA under an Emergency Use Authorization (EUA). This EUA will remain in effect (meaning this test can be used) for the duration of the COVID-19 declaration under Section 564(b)(1) of the Act, 21 U.S.C. section 360bbb-3(b)(1), unless the authorization is terminated or revoked.  Performed at [facility], [place] [address], [place], [place] [address]          Radiology Studies: No results found.      Scheduled Meds: . cholecalciferol  1,000 Units Oral Daily  . furosemide  40 mg Oral Daily  . levothyroxine  100 mcg Oral Q0600  . loratadine  10 mg Oral Daily  . mouth rinse  15 mL Mouth Rinse BID  . metoprolol succinate  25 mg Oral Daily  . pantoprazole  40 mg Oral Daily  . sertraline  25 mg Oral BID  . simvastatin  10 mg Oral Daily  . warfarin  1 mg Oral ONCE-1600  . Warfarin - Pharmacist Dosing Inpatient   Does not apply q1600   Continuous Infusions:   LOS: 6 days    Time spent: 35 minutes spent on chart review, discussion with nursing staff, consultants, updating family and interview/physical exam; more than 50% of that time was spent in counseling and/or coordination of care.    Deano Tomaszewski J [place] DO [facility] Available via Epic secure chat 7am-7pm After these hours, please refer to coverage provider listed on [email or web address] 01/06/2021, 10:07 AM

## 2021-01-06 NOTE — Plan of Care (Signed)
  Problem: Education: Goal: Ability to demonstrate management of disease process will improve Outcome: Progressing Goal: Ability to verbalize understanding of medication therapies will improve Outcome: Progressing   Problem: Cardiac: Goal: Ability to achieve and maintain adequate cardiopulmonary perfusion will improve Outcome: Progressing   

## 2021-01-06 NOTE — Progress Notes (Signed)
ANTICOAGULATION CONSULT NOTE - Follow Up Consult  Pharmacy Consult for Warfarin Indication: atrial fibrillation  Allergies  Allergen Reactions  . Penicillins     Unknown    Patient Measurements: Height: 5' (152.4 cm) Weight: 67.1 kg (147 lb 14.9 oz) IBW/kg (Calculated) : 50  Vital Signs: Temp: 98 F (36.7 C) (05/30 0602) Temp Source: Oral (05/30 0602) BP: 114/60 (05/30 0602) Pulse Rate: 96 (05/30 0602)  Labs: Recent Labs    01/04/21 0212 01/05/21 0639 01/06/21 0215  HGB 10.3* 9.9*  --   HCT 32.2* 31.4*  --   PLT 99* 102*  --   LABPROT 29.3* 30.3* 29.0*  INR 2.8* 2.9* 2.7*  CREATININE 1.83* 1.93* 1.96*    Estimated Creatinine Clearance: 16.9 mL/min (A) (by C-G formula based on SCr of 1.96 mg/dL (H)).  Assessment: 87 yr old male admitted 5/24 after syncope, fall and LOC. Noted to have hit the back of his head and has scalp laceration, stapled.  No bleeding per CT head. Continues on Warfarin as PTA for atrial fibrillation.    PTA warfarin regimen per facility MAR: 1 mg MWF, 1.5 mg TTSS (9 mg weekly), since 11/09/20.  Previously on 1.25 mg daily (8.75 mg weekly).  INR remains therapeutic at 2.7, stable. PLTs low but stable. No bleeding noted.  Goal of Therapy:  INR 2-3 Monitor platelets by anticoagulation protocol: Yes   Plan:  Warfarin 1 mg x1 Daily PT/INR Monitor for s/sx bleeding.  Norina Buzzard, PharmD PGY1 Pharmacy Resident 01/06/2021 8:22 AM  Please check [email or web address] for unit specific pharmacy phone numbers.

## 2021-01-06 NOTE — Progress Notes (Signed)
Progress Note  Patient Name: [name] Date of Encounter: 01/06/2021  [facility] Cardiologist: Freada Bergeron, MD   Subjective   87 yo with hx of atrial fib,   chronic combined CHF, CKD , CAD  Admitted with worsening CHF  Echo on Jan 01, 2021 showed EF 40-45%.  Unable to assess diastolic funciton Mild MR, mild - mod TR,   Feeling better, still gets short of breath with walking .    Inpatient Medications    Scheduled Meds: . cholecalciferol  1,000 Units Oral Daily  . furosemide  40 mg Oral Daily  . levothyroxine  100 mcg Oral Q0600  . loratadine  10 mg Oral Daily  . mouth rinse  15 mL Mouth Rinse BID  . metoprolol succinate  25 mg Oral Daily  . pantoprazole  40 mg Oral Daily  . sertraline  25 mg Oral BID  . simvastatin  10 mg Oral Daily  . Warfarin - Pharmacist Dosing Inpatient   Does not apply q1600   Continuous Infusions:  PRN Meds: acetaminophen, diazepam, senna-docusate   Vital Signs    Vitals:   01/05/21 1616 01/05/21 2143 01/06/21 0500 01/06/21 0602  BP: (!) 106/57 (!) 114/57  114/60  Pulse: 63 90  96  Resp: 20 18  16   Temp: 98 F (36.7 C) 98.2 F (36.8 C)  98 F (36.7 C)  TempSrc: Oral Oral  Oral  SpO2: 100% 98%  96%  Weight:   67.1 kg   Height:        Intake/Output Summary (Last 24 hours) at 01/06/2021 0817 Last data filed at 01/06/2021 0743 Gross per 24 hour  Intake 600 ml  Output 1351 ml  Net -751 ml   Last 3 Weights 01/06/2021 01/05/2021 01/04/2021  Weight (lbs) 147 lb 14.9 oz 148 lb 4.8 oz 148 lb 5.9 oz  Weight (kg) 67.1 kg 67.268 kg 67.3 kg      Telemetry    Atrial fib  - Personally Reviewed  ECG     - Personally Reviewed  Physical Exam   GEN: elderly , frail, NAD    Neck: No JVD Cardiac:  irreg. Irreg.  Soft systolic murmur   Respiratory:  a few scattered rales  GI: Soft, nontender, non-distended  MS: 2+ lower leg edema  Neuro:  Nonfocal  Psych: Normal affect   Labs    High Sensitivity Troponin:    Recent Labs  Lab 12/31/20 1646 12/31/20 1846  TROPONINIHS 104* 107*      Chemistry Recent Labs  Lab 12/31/20 1630 01/01/21 0213 01/04/21 0212 01/05/21 0639 01/06/21 0215  NA 135   < > 134* 135 134*  K 4.5   < > 3.7 3.8 4.0  CL 97*   < > 97* 98 96*  CO2 27   < > 30 31 32  GLUCOSE 146*   < > 102* 98 103*  BUN 48*   < > 45* 45* 47*  CREATININE 2.55*   < > 1.83* 1.93* 1.96*  CALCIUM 8.9   < > 8.3* 8.4* 8.6*  PROT 6.6  --   --   --   --   ALBUMIN 3.6  --   --   --   --   AST 29  --   --   --   --   ALT 23  --   --   --   --   ALKPHOS 82  --   --   --   --  BILITOT 1.0  --   --   --   --   GFRNONAA 22*   < > 33* 31* 30*  ANIONGAP 11   < > 7 6 6    < > = values in this interval not displayed.     Hematology Recent Labs  Lab 01/01/21 0213 01/04/21 0212 01/05/21 0639  WBC 7.7 6.9 6.1  RBC 3.59* 3.42* 3.32*  HGB 10.8* 10.3* 9.9*  HCT 33.7* 32.2* 31.4*  MCV 93.9 94.2 94.6  MCH 30.1 30.1 29.8  MCHC 32.0 32.0 31.5  RDW 17.2* 16.8* 16.6*  PLT 115* 99* 102*    BNP Recent Labs  Lab 12/31/20 1646  BNP 749.5*     DDimer No results for input(s): DDIMER in the last 168 hours.   Radiology    No results found.  Cardiac Studies     Patient Profile     87 y.o. male admitted with acute on chronic CHF    Assessment & Plan    1.  Acute on chronic combined systolic and diastolic congestive heart failure: I agree that he does not appear to be very volume overloaded.  He does have some leg edema but I suspect this is more due to dependent edema. I/O are -3.5 liters so far this admission  Cont lasix   Hopefully home soon   2.  Atrial fib:   Cont coumadin, rate is controlled  3.   CAD :  No angina  4. CKD:    Creatinine is 1.96 today .   5.  Hypothyroidism:  Plan per primary team       For questions or updates, please contact [place] Please consult [email or web address] for contact info under        Signed, Mertie Moores, MD  01/06/2021, 8:17 AM

## 2021-01-06 NOTE — Plan of Care (Signed)
  Problem: Activity: Goal: Capacity to carry out activities will improve Outcome: Progressing   Problem: Cardiac: Goal: Ability to achieve and maintain adequate cardiopulmonary perfusion will improve Outcome: Progressing   

## 2021-01-06 NOTE — Progress Notes (Signed)
Occupational Therapy Treatment Patient Details Name: [name] MRN: [id number] DOB: 03-23-1920 Today's Date: 01/06/2021    History of present illness Pt is a 87 y.o. male admitted from ALF on 12/31/20 with syncopal episode (+ LOC), fall and hitting head; pt reports similar episode 2 months prior, as well as worsening DOE and BLE edema. Workup revealed R posterior head lac post-staples, hypoxia, pulmonary edema, AKI. Head CT negative for acute injury. PMH includes afib on coumadin, HF, CKD IV.   OT comments  Patient supine in bed and agreeable to OT. Completing bed mobility with supervision, transfers using rollator with min assist from EOB and BSC to power up and steady.  Pt reports having extra elevated commode and elevating EOB at home compared to here, therefore needing less assistance.  Patient requires min assist for toileting after BM for hygiene, reports able to complete seated at home but setup is different.  Supervision for grooming at sink.  Reports he can have assistance for ADLs and mobility at home.  Will follow acutely, HH remains appropriate.    Follow Up Recommendations  Home health OT;Supervision - Intermittent    Equipment Recommendations  3 in 1 bedside commode    Recommendations for Other Services      Precautions / Restrictions Precautions Precautions: Fall;Other (comment) Precaution Comments: Watch SpO2 Restrictions Weight Bearing Restrictions: No       Mobility Bed Mobility Overal bed mobility: Needs Assistance Bed Mobility: Sit to Supine     Supine to sit: Supervision     General bed mobility comments: no assist required, HOB elevated    Transfers Overall transfer level: Needs assistance Equipment used: 4-wheeled walker Transfers: Sit to/from Stand Sit to Stand: Min assist         General transfer comment: min assist to power up from EOB and BSC    Balance Overall balance assessment: Needs assistance Sitting-balance support: Feet  supported;No upper extremity supported Sitting balance-Leahy Scale: Good     Standing balance support: Bilateral upper extremity supported;No upper extremity supported;During functional activity Standing balance-Leahy Scale: Fair Standing balance comment: grooming at sink with supervision, dynamically requires BUE support                           ADL either performed or assessed with clinical judgement   ADL Overall ADL's : Needs assistance/impaired     Grooming: Supervision/safety;Standing;Wash/dry hands               Lower Body Dressing: Moderate assistance;Sit to/from stand Lower Body Dressing Details (indicate cue type and reason): able to pull socks up but requires assist to don, min assist sit to stand Toilet Transfer: Minimal assistance;Ambulation;BSC (rollator) Toilet Transfer Details (indicate cue type and reason): reports using raised toilet seat on top of handicapped commode at home Toileting- Clothing Manipulation and Hygiene: Minimal assistance;Sit to/from stand Toileting - Clothing Manipulation Details (indicate cue type and reason): for hygiene     Functional mobility during ADLs: Min guard (rollator) General ADL Comments: pt limited by weakness, decreased activity tolerance, impaired balance     Vision   Vision Assessment?: No apparent visual deficits   Perception     Praxis      Cognition Arousal/Alertness: Awake/alert Behavior During Therapy: WFL for tasks assessed/performed Overall Cognitive Status: Within Functional Limits for tasks assessed  Exercises     Shoulder Instructions       General Comments SpO2 on 2L 94% or greater    Pertinent Vitals/ Pain       Pain Assessment: No/denies pain  Home Living                                          Prior Functioning/Environment              Frequency  Min 2X/week        Progress Toward  Goals  OT Goals(current goals can now be found in the care plan section)  Progress towards OT goals: Progressing toward goals  Acute Rehab OT Goals Patient Stated Goal: Return home OT Goal Formulation: With patient  Plan Discharge plan remains appropriate;Frequency remains appropriate    Co-evaluation                 AM-PAC OT "6 Clicks" Daily Activity     Outcome Measure   Help from another person eating meals?: None Help from another person taking care of personal grooming?: A Little Help from another person toileting, which includes using toliet, bedpan, or urinal?: A Little Help from another person bathing (including washing, rinsing, drying)?: A Lot Help from another person to put on and taking off regular upper body clothing?: A Little Help from another person to put on and taking off regular lower body clothing?: A Lot 6 Click Score: 17    End of Session Equipment Utilized During Treatment: Other (comment);Oxygen (rollator)  OT Visit Diagnosis: Other abnormalities of gait and mobility (R26.89);Muscle weakness (generalized) (M62.81);History of falling (Z91.81)   Activity Tolerance Patient tolerated treatment well   Patient Left in chair;with call bell/phone within reach;with chair alarm set   Nurse Communication Mobility status        Time: 1448-1856 OT Time Calculation (min): 30 min  Charges: OT General Charges $OT Visit: 1 Visit OT Treatments $Self Care/Home Management : 23-37 mins  Jolaine Artist, OT Acute Rehabilitation Services Pager [phone] Office [place] 01/06/2021, 2:35 PM

## 2021-01-07 DIAGNOSIS — I5041 Acute combined systolic (congestive) and diastolic (congestive) heart failure: Secondary | ICD-10-CM | POA: Diagnosis not present

## 2021-01-07 LAB — BASIC METABOLIC PANEL
Anion gap: 9 (ref 5–15)
BUN: 46 mg/dL — ABNORMAL HIGH (ref 8–23)
CO2: 30 mmol/L (ref 22–32)
Calcium: 8.5 mg/dL — ABNORMAL LOW (ref 8.9–10.3)
Chloride: 95 mmol/L — ABNORMAL LOW (ref 98–111)
Creatinine, Ser: 1.76 mg/dL — ABNORMAL HIGH (ref 0.61–1.24)
GFR, Estimated: 35 mL/min — ABNORMAL LOW (ref >60)
Glucose, Bld: 96 mg/dL (ref 70–99)
Potassium: 4 mmol/L (ref 3.5–5.1)
Sodium: 134 mmol/L — ABNORMAL LOW (ref 135–145)

## 2021-01-07 LAB — PROTIME-INR
INR: 2.3 — ABNORMAL HIGH (ref 0.8–1.2)
Prothrombin Time: 25.3 seconds — ABNORMAL HIGH (ref 11.4–15.2)

## 2021-01-07 MED ORDER — WARFARIN SODIUM 1 MG PO TABS
1.5000 mg | ORAL_TABLET | Freq: Once | ORAL | Status: AC
  Administered 2021-01-07: 1.5 mg via ORAL
  Filled 2021-01-07: qty 1

## 2021-01-07 MED ORDER — FUROSEMIDE 80 MG PO TABS
80.0000 mg | ORAL_TABLET | Freq: Every day | ORAL | Status: DC
  Administered 2021-01-07 – 2021-01-08 (×2): 80 mg via ORAL
  Filled 2021-01-07 (×2): qty 1

## 2021-01-07 NOTE — Progress Notes (Signed)
PROGRESS NOTE    [name]  EZM:[id number] DOB: [birth year] DOA: 12/31/2020 PCP: Merryl Hacker, No    Brief Narrative:  [name] is a 87 year old male with past medical history significant for paroxysmal atrial fibrillation on Coumadin, chronic diastolic congestive heart failure, CKD stage IV, anemia due to CKD, HLD, hypothyroidism, depression, anxiety who presented to the ED with concerns of syncope and fall.  Patient was apparently trying to plug in his iPad but suddenly lost consciousness and fell backwards hitting his head on a bedside table.  Patient sustained laceration to his right posterior scalp.  Patient reports increasing dyspnea on exertion for the past 2-3 months with worsening chronic lower extremity edema.  Recently had his Lasix dose decreased due to worsening creatinine level.  Similar episode roughly 2 months ago when he was sitting up in bed.  Denies dizziness or lightheadedness.  No chest pain or palpitations.  Assessment & Plan:   Principal Problem:   Acute CHF (congestive heart failure) (HCC) Active Problems:   Syncope and collapse   Acute respiratory failure with hypoxia (HCC)   Acute-on-chronic kidney injury ([place])   Scalp laceration   Unspecified atrial fibrillation (HCC)   HLD (hyperlipidemia)   Hypothyroidism  Acute hypoxic respiratory failure, POA Acute on chronic diastolic congestive heart failure - BNP, bibasilar effusions, and BLE edema consistent with HF exacerbation - Hypoxic on presentation with SPO2 86% on 4 L nasal cannula.   - TTE with LVEF 40-45%, mild LV hypokinesis, moderate inferior lateral wall hypokinesis, mild concentric LVH, RV function mildly reduced - Cardiology signing off, recommendations for: follow up with APP 2 to 4 weeks following discharge. Follow-up Dr. Johney Frame 3 months --Furosemide back to p.o. dosing 80 mg per cardiology --Not on ACE inhibitor/ARB/ARNI due to renal dysfunction --Continue supplemental oxygen, maintain SPO2  greater than 92%, on 2L [place] w/ SpO2 96% -will likely need supplemental oxygen at discharge given ongoing failure of oxygen walk screen while on room air  Syncope with loss of consciousness - Suspect secondary to prolonged hypoxia from diastolic CHF as above.   - TTE as above.  No concerning arrhythmias or block noted on telemetry. --Continue to monitor for arrhythmias on telemetry --PT/OT recommend home health on discharge  Incidentally positive COVID swab 1 out of 3 -Patient COVID-negative at intake, PCR yesterday initially positive but subsequent confirmatory testing remains negative -Discussed with ID, follow for 24 hours, repeat swab tomorrow to ensure truly negative before discharging back to facility  Acute renal failure on CKD stage IV, resolved Baseline creatinine 2.2 to 2.5.  On admission, creatinine elevated 2.55.  Etiology likely volume overload.    Posterior scalp laceration -Following syncopal event at home, patient sustained posterior scalp laceration.  Underwent skin approximation by EDP with staples on 12/31/2020. -Staples to be removed this afternoon.  Hypothyroidism TSH 5.933, free T4: 1.11.  --Increased levothyroxine from 88 to 100 mcg p.o. daily --Repeat thyroid function test in 4-6 weeks with PCP  Permanent atrial fibrillation --Metoprolol succinate 25 mg p.o. daily --Continue Coumadin, pharmacy consulted for dosing/monitoring (goal 2.0-3.0) --INR daily  Ascending aorta dilation - Ascending aorta noted 45 mm on TTE 2016. Patient showed normal aortic root but ascending aorta was not well visualized.  Given his advanced age and comorbidities, not a candidate for surgical repair per cardiology.  Hyperlipidemia: Simvastatin 10 mg p.o. daily  Depression/anxiety - Sertraline 25 mg p.o. twice daily - Diazepam 2.5 mg p.o. every 6 hours as needed anxiety  GERD: Continue PPI  Constipation: Resolved - Patient reports bowel movement yesterday after receiving magnesium  citrate - Senokot prn   DVT prophylaxis: coumadin   Code Status: DNR Family Communication: Family previously updated, no answer on phone today  Disposition Plan:  Level of care: Telemetry Cardiac Status is: Inpatient  Remains inpatient appropriate because:Ongoing diagnostic testing needed not appropriate for outpatient work up, Unsafe d/c plan, IV treatments appropriate due to intensity of illness or inability to take PO and Inpatient level of care appropriate due to severity of illness   Dispo: The patient is from: [place]              Anticipated d/c is to: To be determined likely back to ALF at friend's home, indeterminate COVID testing delaying patient's discharge              Patient currently is not medically stable to d/c.   Difficult to place patient No  Consultants:   Cardiology  Procedures:   Scalp laceration repair with staples 12/31/2020; staple removal 01/07/2021  Antimicrobials:   None  Subjective: No acute issues or events overnight denies nausea vomiting diarrhea constipation headache fevers or chills.  Somewhat dyspneic with exertion but generally improving from previous.  Objective: Vitals:   01/07/21 0100 01/07/21 0200 01/07/21 0300 01/07/21 0535  BP:    (!) 116/48  Pulse:      Resp:    20  Temp:    (!) 96.8 F (36 C)  TempSrc:    Oral  SpO2: 98% 98% 100% 99%  Weight:    67.8 kg  Height:        Intake/Output Summary (Last 24 hours) at 01/07/2021 0705 Last data filed at 01/07/2021 0550 Gross per 24 hour  Intake 840 ml  Output 900 ml  Net -60 ml   Filed Weights   01/05/21 0515 01/06/21 0500 01/07/21 0535  Weight: 67.3 kg 67.1 kg 67.8 kg    Examination:  General:  Pleasantly resting in bed, No acute distress. HEENT:  Normocephalic atraumatic.  Sclerae nonicteric, noninjected.  Extraocular movements intact bilaterally. Neck:  Without mass or deformity.  Trachea is midline. Lungs:  Clear to auscultate bilaterally without rhonchi,  wheeze, or rales. Heart:  Regular rate and rhythm.  Without murmurs, rubs, or gallops. Abdomen:  Soft, nontender, nondistended.  Without guarding or rebound. Extremities: Without cyanosis, clubbing, 1+ pitting edema bilateral lower extremities, or obvious deformity. Vascular:  Dorsalis pedis and posterior tibial pulses palpable bilaterally. Skin:  Warm and dry, no erythema, no ulcerations.   Data Reviewed: I have personally reviewed following labs and imaging studies  CBC: Recent Labs  Lab 12/31/20 1630 01/01/21 0213 01/04/21 0212 01/05/21 0639  WBC 7.8 7.7 6.9 6.1  NEUTROABS 5.6  --   --   --   HGB 11.8* 10.8* 10.3* 9.9*  HCT 37.1* 33.7* 32.2* 31.4*  MCV 96.1 93.9 94.2 94.6  PLT 125* 115* 99* 400*   Basic Metabolic Panel: Recent Labs  Lab 01/03/21 0348 01/04/21 0212 01/05/21 0639 01/06/21 0215 01/07/21 0332  NA 135 134* 135 134* 134*  K 3.6 3.7 3.8 4.0 4.0  CL 99 97* 98 96* 95*  CO2 30 30 31  32 30  GLUCOSE 93 102* 98 103* 96  BUN 46* 45* 45* 47* 46*  CREATININE 2.01* 1.83* 1.93* 1.96* 1.76*  CALCIUM 8.1* 8.3* 8.4* 8.6* 8.5*   GFR: Estimated Creatinine Clearance: 18.9 mL/min (A) (by C-G formula based on SCr of 1.76 mg/dL (H)). Liver Function Tests: Recent  Labs  Lab 12/31/20 1630  AST 29  ALT 23  ALKPHOS 82  BILITOT 1.0  PROT 6.6  ALBUMIN 3.6   No results for input(s): LIPASE, AMYLASE in the last 168 hours. No results for input(s): AMMONIA in the last 168 hours. Coagulation Profile: Recent Labs  Lab 01/03/21 0348 01/04/21 0212 01/05/21 0639 01/06/21 0215 01/07/21 0332  INR 2.6* 2.8* 2.9* 2.7* 2.3*   Cardiac Enzymes: No results for input(s): CKTOTAL, CKMB, CKMBINDEX, TROPONINI in the last 168 hours. BNP (last 3 results) No results for input(s): PROBNP in the last 8760 hours. HbA1C: No results for input(s): HGBA1C in the last 72 hours. CBG: No results for input(s): GLUCAP in the last 168 hours. Lipid Profile: No results for input(s): CHOL, HDL,  LDLCALC, TRIG, CHOLHDL, LDLDIRECT in the last 72 hours. Thyroid Function Tests: Recent Labs    01/04/21 0717  FREET4 1.11   Anemia Panel: No results for input(s): VITAMINB12, FOLATE, FERRITIN, TIBC, IRON, RETICCTPCT in the last 72 hours. Sepsis Labs: No results for input(s): PROCALCITON, LATICACIDVEN in the last 168 hours.  Recent Results (from the past 240 hour(s))  Resp Panel by RT-PCR (Flu A&B, Covid) Nasopharyngeal Swab     Status: None   Collection Time: 12/31/20  4:46 PM   Specimen: Nasopharyngeal Swab; Nasopharyngeal(NP) swabs in vial transport medium  Result Value Ref Range Status   SARS Coronavirus 2 by RT PCR NEGATIVE NEGATIVE Final    Comment: (NOTE) SARS-CoV-2 target nucleic acids are NOT DETECTED.  The SARS-CoV-2 RNA is generally detectable in upper respiratory specimens during the acute phase of infection. The lowest concentration of SARS-CoV-2 viral copies this assay can detect is 138 copies/mL. A negative result does not preclude SARS-Cov-2 infection and should not be used as the sole basis for treatment or other patient management decisions. A negative result may occur with  improper specimen collection/handling, submission of specimen other than nasopharyngeal swab, presence of viral mutation(s) within the areas targeted by this assay, and inadequate number of viral copies(<138 copies/mL). A negative result must be combined with clinical observations, patient history, and epidemiological information. The expected result is Negative.  Fact Sheet for Patients:  EntrepreneurPulse.com.au  Fact Sheet for Healthcare Providers:  IncredibleEmployment.be  This test is no t yet approved or cleared by the [place] FDA and  has been authorized for detection and/or diagnosis of SARS-CoV-2 by FDA under an Emergency Use Authorization (EUA). This EUA will remain  in effect (meaning this test can be used) for the duration of  the COVID-19 declaration under Section 564(b)(1) of the Act, 21 U.S.C.section 360bbb-3(b)(1), unless the authorization is terminated  or revoked sooner.       Influenza A by PCR NEGATIVE NEGATIVE Final   Influenza B by PCR NEGATIVE NEGATIVE Final    Comment: (NOTE) The Xpert Xpress SARS-CoV-2/FLU/RSV plus assay is intended as an aid in the diagnosis of influenza from Nasopharyngeal swab specimens and should not be used as a sole basis for treatment. Nasal washings and aspirates are unacceptable for Xpert Xpress SARS-CoV-2/FLU/RSV testing.  Fact Sheet for Patients: EntrepreneurPulse.com.au  Fact Sheet for Healthcare Providers: IncredibleEmployment.be  This test is not yet approved or cleared by the [place] FDA and has been authorized for detection and/or diagnosis of SARS-CoV-2 by FDA under an Emergency Use Authorization (EUA). This EUA will remain in effect (meaning this test can be used) for the duration of the COVID-19 declaration under Section 564(b)(1) of the Act, 21 U.S.C. section 360bbb-3(b)(1), unless the authorization  is terminated or revoked.  Performed at [facility], [place] [address], [place], [place] [address]   SARS CORONAVIRUS 2 (TAT 6-24 HRS) Nasopharyngeal Nasopharyngeal Swab     Status: Abnormal   Collection Time: 01/06/21 10:11 AM   Specimen: Nasopharyngeal Swab  Result Value Ref Range Status   SARS Coronavirus 2 POSITIVE (A) NEGATIVE Final    Comment: (NOTE) SARS-CoV-2 target nucleic acids are DETECTED.  The SARS-CoV-2 RNA is generally detectable in upper and lower respiratory specimens during the acute phase of infection. Positive results are indicative of the presence of SARS-CoV-2 RNA. Clinical correlation with patient history and other diagnostic information is  necessary to determine patient infection status. Positive results do not rule out bacterial infection or co-infection with other viruses.  The  expected result is Negative.  Fact Sheet for Patients: SugarRoll.be  Fact Sheet for Healthcare Providers: https://www.woods-mathews.com/  This test is not yet approved or cleared by the [place] FDA and  has been authorized for detection and/or diagnosis of SARS-CoV-2 by FDA under an Emergency Use Authorization (EUA). This EUA will remain  in effect (meaning this test can be used) for the duration of the COVID-19 declaration under Section 564(b)(1) of the Act, 21 U. S.C. section 360bbb-3(b)(1), unless the authorization is terminated or revoked sooner.   Performed at [facility], [place] [address] [place], [place] [address]   Resp Panel by RT-PCR (Flu A&B, Covid) Nasopharyngeal Swab     Status: None   Collection Time: 01/06/21  6:32 PM   Specimen: Nasopharyngeal Swab; Nasopharyngeal(NP) swabs in vial transport medium  Result Value Ref Range Status   SARS Coronavirus 2 by RT PCR NEGATIVE NEGATIVE Final    Comment: (NOTE) SARS-CoV-2 target nucleic acids are NOT DETECTED.  The SARS-CoV-2 RNA is generally detectable in upper respiratory specimens during the acute phase of infection. The lowest concentration of SARS-CoV-2 viral copies this assay can detect is 138 copies/mL. A negative result does not preclude SARS-Cov-2 infection and should not be used as the sole basis for treatment or other patient management decisions. A negative result may occur with  improper specimen collection/handling, submission of specimen other than nasopharyngeal swab, presence of viral mutation(s) within the areas targeted by this assay, and inadequate number of viral copies(<138 copies/mL). A negative result must be combined with clinical observations, patient history, and epidemiological information. The expected result is Negative.  Fact Sheet for Patients:  EntrepreneurPulse.com.au  Fact Sheet for Healthcare Providers:   IncredibleEmployment.be  This test is no t yet approved or cleared by the [place] FDA and  has been authorized for detection and/or diagnosis of SARS-CoV-2 by FDA under an Emergency Use Authorization (EUA). This EUA will remain  in effect (meaning this test can be used) for the duration of the COVID-19 declaration under Section 564(b)(1) of the Act, 21 U.S.C.section 360bbb-3(b)(1), unless the authorization is terminated  or revoked sooner.       Influenza A by PCR NEGATIVE NEGATIVE Final   Influenza B by PCR NEGATIVE NEGATIVE Final    Comment: (NOTE) The Xpert Xpress SARS-CoV-2/FLU/RSV plus assay is intended as an aid in the diagnosis of influenza from Nasopharyngeal swab specimens and should not be used as a sole basis for treatment. Nasal washings and aspirates are unacceptable for Xpert Xpress SARS-CoV-2/FLU/RSV testing.  Fact Sheet for Patients: EntrepreneurPulse.com.au  Fact Sheet for Healthcare Providers: IncredibleEmployment.be  This test is not yet approved or cleared by the [place] FDA and has been  authorized for detection and/or diagnosis of SARS-CoV-2 by FDA under an Emergency Use Authorization (EUA). This EUA will remain in effect (meaning this test can be used) for the duration of the COVID-19 declaration under Section 564(b)(1) of the Act, 21 U.S.C. section 360bbb-3(b)(1), unless the authorization is terminated or revoked.  Performed at [facility], [place] [address], [place], [place] [address]          Radiology Studies: No results found.      Scheduled Meds: . cholecalciferol  1,000 Units Oral Daily  . furosemide  40 mg Oral Daily  . levothyroxine  100 mcg Oral Q0600  . loratadine  10 mg Oral Daily  . mouth rinse  15 mL Mouth Rinse BID  . metoprolol succinate  25 mg Oral Daily  . pantoprazole  40 mg Oral Daily  . sertraline  25 mg Oral BID  . simvastatin  10 mg Oral Daily   . Warfarin - Pharmacist Dosing Inpatient   Does not apply q1600   Continuous Infusions:   LOS: 7 days    Time spent: 8 minutes   Little Ishikawa, DO [facility]  Pager: secure chat 7am-7pm  After these hours, please refer to coverage provider listed on [email or web address] 01/07/2021, 7:05 AM

## 2021-01-07 NOTE — Progress Notes (Signed)
Per infection prevention RN, Sherlynn Stalls, Dr. Baxter Flattery will be contacted in regards to the patient's COVID status. Per IP, keep the patient on precautions until Dr. Baxter Flattery investigates.

## 2021-01-07 NOTE — Progress Notes (Signed)
Physical Therapy Treatment Patient Details Name: [name] MRN: [id number] DOB: 14-Oct-1919 Today's Date: 01/07/2021    History of Present Illness Pt is a 87 y.o. male admitted from ALF on 12/31/20 with syncopal episode (+ LOC), fall and hitting head; pt reports similar episode 2 months prior, as well as worsening DOE and BLE edema. Workup revealed R posterior head lac post-staples, hypoxia, pulmonary edema, AKI. Head CT negative for acute injury. PMH includes afib on coumadin, HF, CKD IV.    PT Comments    Patient reports not feeling great today with regards to SOB and weakness compared to yesterday. Tolerated there ex sitting in chair and functional activities re: sit to stand repetitions for strengthening and marching in place. Pt had just walked with nursing prior to PT arrival for an 02 sat test. Sp02 remained >94% on 2L/min 02 [place] with activity. Encouraged there ex and HEP of LEs daily. Will continue to follow and progress as tolerated.   Follow Up Recommendations  Home health PT;Supervision - Intermittent     Equipment Recommendations  None recommended by PT    Recommendations for Other Services       Precautions / Restrictions Precautions Precautions: Fall;Other (comment) Precaution Comments: Watch SpO2 Restrictions Weight Bearing Restrictions: No    Mobility  Bed Mobility               General bed mobility comments: Up in chair upon PT arrival.    Transfers Overall transfer level: Needs assistance Equipment used: Rolling walker (2 wheeled) Transfers: Sit to/from Stand Sit to Stand: Min assist         General transfer comment: Min A to power to standing with cues for anterior weight shift and hand placement. Stood from chair x5 for strengthening.  Ambulation/Gait             General Gait Details: Deferred as pt just walked with nursing for 02 sat test.   Stairs             Wheelchair Mobility    Modified Rankin (Stroke Patients  Only)       Balance Overall balance assessment: Needs assistance Sitting-balance support: Feet supported;No upper extremity supported Sitting balance-Leahy Scale: Good Sitting balance - Comments: Prefers back rest after some time due to fatigue.   Standing balance support: During functional activity Standing balance-Leahy Scale: Poor Standing balance comment: Requires UE support in standing, marching in place x10.                            Cognition Arousal/Alertness: Awake/alert Behavior During Therapy: WFL for tasks assessed/performed Overall Cognitive Status: Within Functional Limits for tasks assessed                                 General Comments: WFL for simple tasks; requires some repetition due to [facility]      Exercises General Exercises - Lower Extremity Long Arc Quad: AROM;Both;10 reps;Seated Hip ABduction/ADduction: AROM;Both;15 reps;Seated (x2) Heel Raises: AROM;Both;15 reps;Seated (x2)    General Comments General comments (skin integrity, edema, etc.): Sp02 remained >94% with activity on 2L/min 02 [place].      Pertinent Vitals/Pain Pain Assessment: No/denies pain    Home Living                      Prior Function  PT Goals (current goals can now be found in the care plan section) Progress towards PT goals: Progressing toward goals    Frequency    Min 3X/week      PT Plan Current plan remains appropriate    Co-evaluation              AM-PAC PT "6 Clicks" Mobility   Outcome Measure  Help needed turning from your back to your side while in a flat bed without using bedrails?: None Help needed moving from lying on your back to sitting on the side of a flat bed without using bedrails?: None Help needed moving to and from a bed to a chair (including a wheelchair)?: A Little Help needed standing up from a chair using your arms (e.g., wheelchair or bedside chair)?: A Little Help needed to walk in  hospital room?: A Little Help needed climbing 3-5 steps with a railing? : A Lot 6 Click Score: 19    End of Session Equipment Utilized During Treatment: Gait belt;Oxygen Activity Tolerance: Patient tolerated treatment well Patient left: in chair;with call bell/phone within reach Nurse Communication: Mobility status PT Visit Diagnosis: Other abnormalities of gait and mobility (R26.89);Muscle weakness (generalized) (M62.81)     Time: 0017-4944 PT Time Calculation (min) (ACUTE ONLY): 24 min  Charges:  $Therapeutic Exercise: 8-22 mins $Therapeutic Activity: 8-22 mins                     Marisa Severin, PT, DPT Acute Rehabilitation Services Pager [phone] Office [place] A Sabra Heck 01/07/2021, 3:06 PM

## 2021-01-07 NOTE — Progress Notes (Signed)
SATURATION QUALIFICATIONS: (This note is used to comply with regulatory documentation for home oxygen)  Patient Saturations on Room Air at Rest = 86%  Patient Saturations on Room Air while Ambulating = 82%  Patient Saturations on 2 Liters of oxygen while Ambulating = 91%  Pt oxygen saturation while sitting on room air at 86%, reports feeling SOB, did not tolerate well. O2 saturation greatly improved on 2L nasal cannula to 91% while walking and 94% resting.

## 2021-01-07 NOTE — Progress Notes (Signed)
Progress Note  Patient Name: [name] Date of Encounter: 01/07/2021  [facility] Cardiologist: Freada Bergeron, MD   Subjective   No CP; some DOE  Inpatient Medications    Scheduled Meds: . cholecalciferol  1,000 Units Oral Daily  . furosemide  40 mg Oral Daily  . levothyroxine  100 mcg Oral Q0600  . loratadine  10 mg Oral Daily  . mouth rinse  15 mL Mouth Rinse BID  . metoprolol succinate  25 mg Oral Daily  . pantoprazole  40 mg Oral Daily  . sertraline  25 mg Oral BID  . simvastatin  10 mg Oral Daily  . Warfarin - Pharmacist Dosing Inpatient   Does not apply q1600   Continuous Infusions:  PRN Meds: acetaminophen, diazepam, senna-docusate   Vital Signs    Vitals:   01/07/21 0100 01/07/21 0200 01/07/21 0300 01/07/21 0535  BP:    (!) 116/48  Pulse:      Resp:    20  Temp:    (!) 96.8 F (36 C)  TempSrc:    Oral  SpO2: 98% 98% 100% 99%  Weight:    67.8 kg  Height:        Intake/Output Summary (Last 24 hours) at 01/07/2021 0823 Last data filed at 01/07/2021 0550 Gross per 24 hour  Intake 480 ml  Output 500 ml  Net -20 ml   Last 3 Weights 01/07/2021 01/06/2021 01/05/2021  Weight (lbs) 149 lb 6.4 oz 147 lb 14.9 oz 148 lb 4.8 oz  Weight (kg) 67.767 kg 67.1 kg 67.268 kg      Telemetry    Atrial fibrillation rate controlled- Personally Reviewed   Physical Exam   GEN: WD NAD Neck: supple, no JVD Cardiac: Irregular, no murmur Respiratory: diminished BS GI: Soft, NT, ND, no masses MS: trace edema Neuro:  No focal findings Psych: Normal affect   Labs    High Sensitivity Troponin:   Recent Labs  Lab 12/31/20 1646 12/31/20 1846  TROPONINIHS 104* 107*      Chemistry Recent Labs  Lab 12/31/20 1630 01/01/21 0213 01/05/21 0639 01/06/21 0215 01/07/21 0332  NA 135   < > 135 134* 134*  K 4.5   < > 3.8 4.0 4.0  CL 97*   < > 98 96* 95*  CO2 27   < > 31 32 30  GLUCOSE 146*   < > 98 103* 96  BUN 48*   < > 45* 47* 46*  CREATININE  2.55*   < > 1.93* 1.96* 1.76*  CALCIUM 8.9   < > 8.4* 8.6* 8.5*  PROT 6.6  --   --   --   --   ALBUMIN 3.6  --   --   --   --   AST 29  --   --   --   --   ALT 23  --   --   --   --   ALKPHOS 82  --   --   --   --   BILITOT 1.0  --   --   --   --   GFRNONAA 22*   < > 31* 30* 35*  ANIONGAP 11   < > 6 6 9    < > = values in this interval not displayed.     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  Echocardiogram this admission shows ejection fraction 40 to 45%, mild left ventricular hypertrophy, mild right ventricular enlargement with moderate RV dysfunction, severe left atrial enlargement, moderate right atrial enlargement, mild mitral regurgitation, mild to moderate tricuspid regurgitation, mild to moderate aortic insufficiency.  Assessment & Plan    1 acute on chronic combined systolic/diastolic congestive heart failure-patient appears to be euvolemic.  We will change Lasix to 80 mg daily which appears to be his home dose.  He will need to bmet 1 week following discharge.  2 cardiomyopathy-we will continue beta-blocker.  Patient not a candidate for aggressive cardiac evaluation given chronic kidney disease and age.  3 permanent atrial fibrillation-patient's heart rate is controlled.  Continue beta-blocker at present dose.  Continue anticoagulation with Coumadin.  4 valvular heart  disease-conservative measures given patient's age.  5 coronary artery disease-continue statin.  No aspirin given need for Coumadin.  6 chronic stage IV kidney disease-creatinine has improved since admission with diuresis.  Continue to follow.  7 Interstitial lung disease-Per primary care.  Cardiology will sign off.  Would arrange follow-up with APP 2 to 4 weeks following discharge.  Follow-up Dr. Johney Frame 3 months.  Bmet 1 week.  Please call with questions.  For questions or updates, please contact [place] Please consult [email or web address] for contact info under    Signed, Kirk Ruths, MD  01/07/2021, 8:23 AM

## 2021-01-07 NOTE — Progress Notes (Signed)
ANTICOAGULATION CONSULT NOTE - Follow Up Consult  Pharmacy Consult for Warfarin Indication: atrial fibrillation  Allergies  Allergen Reactions  . Penicillins     Unknown    Patient Measurements: Height: 5' (152.4 cm) Weight: 67.8 kg (149 lb 6.4 oz) (scale B) IBW/kg (Calculated) : 50  Vital Signs: Temp: 96.8 F (36 C) (05/31 0535) Temp Source: Oral (05/31 0535) BP: 116/48 (05/31 0535)  Labs: Recent Labs    01/05/21 0639 01/06/21 0215 01/07/21 0332  HGB 9.9*  --   --   HCT 31.4*  --   --   PLT 102*  --   --   LABPROT 30.3* 29.0* 25.3*  INR 2.9* 2.7* 2.3*  CREATININE 1.93* 1.96* 1.76*    Estimated Creatinine Clearance: 18.9 mL/min (A) (by C-G formula based on SCr of 1.76 mg/dL (H)).  Assessment: 87 yr old male admitted 5/24 after syncope, fall and LOC. Noted to have hit the back of his head and has scalp laceration, stapled.  No bleeding per CT head. Continues on Warfarin as PTA for atrial fibrillation.    PTA warfarin regimen per facility MAR: 1 mg MWF, 1.5 mg TTSS (9 mg weekly), since 11/09/20.  Previously on 1.25 mg daily (8.75 mg weekly).  INR remains therapeutic at 2.3  Goal of Therapy:  INR 2-3 Monitor platelets by anticoagulation protocol: Yes   Plan:  Warfarin 1.5 mg po  x1 Daily PT/INR Monitor for s/sx bleeding.  Discharge on home dose - > 1 mg MWF, 1.5 mg TTSS  Thank you Anette Guarneri, PharmD 01/07/2021 8:46 AM  Please check [email or web address] for unit specific pharmacy phone numbers.

## 2021-01-08 DIAGNOSIS — I5033 Acute on chronic diastolic (congestive) heart failure: Secondary | ICD-10-CM

## 2021-01-08 DIAGNOSIS — J9601 Acute respiratory failure with hypoxia: Secondary | ICD-10-CM | POA: Diagnosis not present

## 2021-01-08 DIAGNOSIS — I5041 Acute combined systolic (congestive) and diastolic (congestive) heart failure: Secondary | ICD-10-CM | POA: Diagnosis not present

## 2021-01-08 DIAGNOSIS — E039 Hypothyroidism, unspecified: Secondary | ICD-10-CM | POA: Diagnosis not present

## 2021-01-08 LAB — BASIC METABOLIC PANEL
Anion gap: 10 (ref 5–15)
BUN: 48 mg/dL — ABNORMAL HIGH (ref 8–23)
CO2: 30 mmol/L (ref 22–32)
Calcium: 8.7 mg/dL — ABNORMAL LOW (ref 8.9–10.3)
Chloride: 93 mmol/L — ABNORMAL LOW (ref 98–111)
Creatinine, Ser: 1.91 mg/dL — ABNORMAL HIGH (ref 0.61–1.24)
GFR, Estimated: 31 mL/min — ABNORMAL LOW (ref >60)
Glucose, Bld: 102 mg/dL — ABNORMAL HIGH (ref 70–99)
Potassium: 4.1 mmol/L (ref 3.5–5.1)
Sodium: 133 mmol/L — ABNORMAL LOW (ref 135–145)

## 2021-01-08 LAB — CBC
HCT: 32.6 % — ABNORMAL LOW (ref 39.0–52.0)
Hemoglobin: 10.4 g/dL — ABNORMAL LOW (ref 13.0–17.0)
MCH: 30.3 pg (ref 26.0–34.0)
MCHC: 31.9 g/dL (ref 30.0–36.0)
MCV: 95 fL (ref 80.0–100.0)
Platelets: UNDETERMINED 10*3/uL (ref 150–400)
RBC: 3.43 MIL/uL — ABNORMAL LOW (ref 4.22–5.81)
RDW: 16.3 % — ABNORMAL HIGH (ref 11.5–15.5)
WBC: 6.4 10*3/uL (ref 4.0–10.5)
nRBC: 0 % (ref 0.0–0.2)

## 2021-01-08 LAB — RESP PANEL BY RT-PCR (FLU A&B, COVID) ARPGX2
Influenza A by PCR: NEGATIVE
Influenza B by PCR: NEGATIVE
SARS Coronavirus 2 by RT PCR: NEGATIVE

## 2021-01-08 LAB — PROTIME-INR
INR: 2.4 — ABNORMAL HIGH (ref 0.8–1.2)
Prothrombin Time: 26 seconds — ABNORMAL HIGH (ref 11.4–15.2)

## 2021-01-08 MED ORDER — DIAZEPAM 5 MG PO TABS: 2.5000 mg | ORAL_TABLET | Freq: Four times a day (QID) | ORAL | 0 refills | Status: DC | PRN

## 2021-01-08 MED ORDER — WARFARIN SODIUM 1 MG PO TABS
1.0000 mg | ORAL_TABLET | ORAL | Status: DC
  Filled 2021-01-08: qty 1

## 2021-01-08 MED ORDER — WARFARIN SODIUM 1 MG PO TABS: 1.5000 mg | ORAL_TABLET | ORAL | Status: DC

## 2021-01-08 MED ORDER — FUROSEMIDE 80 MG PO TABS: 80.0000 mg | ORAL_TABLET | Freq: Every day | ORAL | 1 refills | Status: DC

## 2021-01-08 NOTE — NC FL2 (Addendum)
[place] LEVEL OF CARE SCREENING TOOL     IDENTIFICATION  Patient Name: [name] Birthdate: March 07, 1920 Sex: male Admission Date (Current Location): 12/31/2020  [facility] and [place] Number:  Herbalist and Address:  [facility] [place]. [facility], [place] [address], [place], [place] [address]      Provider Number: [id number]  Attending Physician Name and Address:  Shemiah Rosch, Silver Huguenin, MD  Relative Name and Phone Number:  Crecencio Mc [phone]    Current Level of Care: Hospital Recommended Level of Care: [place] Prior Approval Number:    Date Approved/Denied:   PASRR Number:    Discharge Plan: Other (Comment) (ALF)    Current Diagnoses: Patient Active Problem List   Diagnosis Date Noted  . Acute CHF (congestive heart failure) ([place]) 12/31/2020  . Syncope and collapse 12/31/2020  . Acute respiratory failure with hypoxia ([place]) 12/31/2020  . Acute-on-chronic kidney injury ([place]) 12/31/2020  . Scalp laceration 12/31/2020  . Unspecified atrial fibrillation ([place]) 12/31/2020  . HLD (hyperlipidemia) 12/31/2020  . Hypothyroidism 12/31/2020    Orientation RESPIRATION BLADDER Height & Weight     Self,Time,Situation,Place  O2 (2L [place]) Incontinent Weight: 150 lb (68 kg) Height:  5' (152.4 cm)  BEHAVIORAL SYMPTOMS/MOOD NEUROLOGICAL BOWEL NUTRITION STATUS      Continent Diet (Regular diet) No salt added  AMBULATORY STATUS COMMUNICATION OF NEEDS Skin   Limited Assist Verbally Laceration; right head (Gauze, Dry, PRN)                     Personal Care Assistance Level of Assistance  Bathing,Feeding,Dressing Bathing Assistance: Limited assistance Feeding assistance: Independent Dressing Assistance: Limited assistance     Functional Limitations Info  Sight,Hearing,Speech Sight Info: Adequate Hearing Info: Impaired Speech Info: Adequate    SPECIAL CARE FACTORS FREQUENCY  PT (By licensed PT),OT (By licensed OT)      PT Frequency: 3x/week OT Frequency: 3x/week            Contractures Contractures Info: Not present    Additional Factors Info  Code Status,Allergies Code Status Info: DNR Allergies Info: Penicillins             Discharge Medications: TAKE these medications       acetaminophen 325 MG tablet Commonly known as: TYLENOL Take 650 mg by mouth 2 (two) times daily.   diazepam 5 MG tablet Commonly known as: VALIUM Take 0.5 tablets (2.5 mg total) by mouth every 6 (six) hours as needed for anxiety.   furosemide 80 MG tablet Commonly known as: LASIX Take 1 tablet (80 mg total) by mouth daily. Start taking on: January 09, 2021 What changed:   medication strength  how much to take  when to take this  additional instructions   levothyroxine 88 MCG tablet Commonly known as: SYNTHROID Take 88 mcg by mouth daily before breakfast.   loratadine 10 MG tablet Commonly known as: CLARITIN Take 10 mg by mouth daily.   metoprolol succinate 25 MG 24 hr tablet Commonly known as: TOPROL-XL Take 25 mg by mouth daily.   omeprazole 20 MG capsule Commonly known as: PRILOSEC Take 20 mg by mouth daily.   sertraline 25 MG tablet Commonly known as: ZOLOFT Take 25 mg by mouth in the morning and at bedtime.   simvastatin 10 MG tablet Commonly known as: ZOCOR Take 10 mg by mouth daily.   Vitamin D3 250 MCG (10000 UT) Tabs Take 1 tablet by mouth daily.   warfarin  1 MG tablet Commonly known as: COUMADIN Take 1-1.5 mg by mouth See admin instructions. Take 1 mg tablet by mouth on Mon,Wed, Friday, then take 1.5 mg tablet by mouth on Sun,Tue,Thu,Sat      Relevant Imaging Results:  Relevant Lab Results:   Additional Information SSN [id number]  [place], [place]

## 2021-01-08 NOTE — NC FL2 (Deleted)
[place] LEVEL OF CARE SCREENING TOOL     IDENTIFICATION  Patient Name: [name] Birthdate: [birth year] Sex: male Admission Date (Current Location): 12/31/2020  [facility] and [place] Number:  Herbalist and Address:  [facility] [place]. [facility] [place] [address] [place], [place] [address]      Provider Number: [id number]  Attending Physician Name and Address:  Elgergawy, Silver Huguenin, MD  Relative Name and Phone Number:  Crecencio Mc [phone]    Current Level of Care: Hospital Recommended Level of Care: [place] Prior Approval Number:    Date Approved/Denied:   PASRR Number:    Discharge Plan: Other (Comment) (ALF)    Current Diagnoses: Patient Active Problem List   Diagnosis Date Noted  . Acute CHF (congestive heart failure) ([place]) 12/31/2020  . Syncope and collapse 12/31/2020  . Acute respiratory failure with hypoxia ([place]) 12/31/2020  . Acute-on-chronic kidney injury ([place]) 12/31/2020  . Scalp laceration 12/31/2020  . Unspecified atrial fibrillation ([place]) 12/31/2020  . HLD (hyperlipidemia) 12/31/2020  . Hypothyroidism 12/31/2020    Orientation RESPIRATION BLADDER Height & Weight     Self,Time,Situation,Place  O2 (2L [place]) Incontinent Weight: 150 lb (68 kg) Height:  5' (152.4 cm)  BEHAVIORAL SYMPTOMS/MOOD NEUROLOGICAL BOWEL NUTRITION STATUS      Continent Diet (Regular diet)  AMBULATORY STATUS COMMUNICATION OF NEEDS Skin   Limited Assist Verbally Surgical wounds                       Personal Care Assistance Level of Assistance  Bathing,Feeding,Dressing Bathing Assistance: Limited assistance Feeding assistance: Independent Dressing Assistance: Limited assistance     Functional Limitations Info  Sight,Hearing,Speech Sight Info: Adequate Hearing Info: Impaired Speech Info: Adequate    SPECIAL CARE FACTORS FREQUENCY  PT (By licensed PT),OT (By licensed OT)     PT Frequency: 3x/week OT  Frequency: 3x/week            Contractures Contractures Info: Not present    Additional Factors Info  Code Status,Allergies Code Status Info: DNR Allergies Info: Penicillins           Current Medications (01/08/2021):  This is the current hospital active medication list Current Facility-Administered Medications  Medication Dose Route Frequency Provider Last Rate Last Admin  . acetaminophen (TYLENOL) tablet 650 mg  650 mg Oral Q6H PRN Vianne Bulls, MD   650 mg at 01/07/21 2115  . cholecalciferol (VITAMIN D3) tablet 1,000 Units  1,000 Units Oral Daily Tu, Ching T, DO   1,000 Units at 01/08/21 0836  . diazepam (VALIUM) tablet 2.5 mg  2.5 mg Oral Q6H PRN Tu, Ching T, DO   2.5 mg at 01/07/21 2115  . furosemide (LASIX) tablet 80 mg  80 mg Oral Daily Lelon Perla, MD   80 mg at 01/08/21 0836  . levothyroxine (SYNTHROID) tablet 100 mcg  100 mcg Oral Q0600 [place] Eric J, DO   100 mcg at 01/08/21 0548  . loratadine (CLARITIN) tablet 10 mg  10 mg Oral Daily Tu, Ching T, DO   10 mg at 01/08/21 0836  . MEDLINE mouth rinse  15 mL Mouth Rinse BID [place] Eric J, DO   15 mL at 01/08/21 1107  . metoprolol succinate (TOPROL-XL) 24 hr tablet 25 mg  25 mg Oral Daily Tu, Ching T, DO   25 mg at 01/08/21 0836  . pantoprazole (PROTONIX) EC tablet 40 mg  40 mg Oral Daily Tu, Ching  T, DO   40 mg at 01/08/21 0836  . senna-docusate (Senokot-S) tablet 1 tablet  1 tablet Oral QHS PRN [place] Eric J, DO   1 tablet at 01/07/21 2114  . sertraline (ZOLOFT) tablet 25 mg  25 mg Oral BID Tu, Ching T, DO   25 mg at 01/08/21 0836  . simvastatin (ZOCOR) tablet 10 mg  10 mg Oral Daily Tu, Ching T, DO   10 mg at 01/08/21 0836  . warfarin (COUMADIN) tablet 1 mg  1 mg Oral Once per day on Mon Wed Fri Elgergawy, Silver Huguenin, MD       And  . Derrill Memo ON 01/09/2021] warfarin (COUMADIN) tablet 1.5 mg  1.5 mg Oral Once per day on Sun Tue Thu Sat Elgergawy, Silver Huguenin, MD      . Warfarin - Pharmacist Dosing Inpatient   Does not  apply q1600 [place] [facility]   Given at 01/07/21 1755     Discharge Medications: Please see discharge summary for a list of discharge medications.  Relevant Imaging Results:  Relevant Lab Results:   Additional Information SSN [id number]  Reece Agar, [place]

## 2021-01-08 NOTE — Care Management Important Message (Signed)
Important Message  Patient Details  Name: [name] MRN: [id number] Date of Birth: [birth year]   Medicare Important Message Given:  Yes     Shelda Altes 01/08/2021, 9:48 AM

## 2021-01-08 NOTE — Progress Notes (Signed)
SATURATION QUALIFICATIONS: (This note is used to comply with regulatory documentation for home oxygen)  Patient Saturations on Room Air at Rest = 91%  Patient Saturations on Room Air while Ambulating = 85%  Patient Saturations on 2 Liters of oxygen while Ambulating = 94%  Please briefly explain why patient needs home oxygen: 

## 2021-01-08 NOTE — TOC Progression Note (Signed)
Transition of Care [facility]) - Progression Note    Patient Details  Name: CARROL BONDAR MRN: [id number] Date of Birth: 1919/12/02  [facility] of [facility] CM/SW Contact  Reece Agar, [place] Phone Number: 01/08/2021, 12:27 PM  Clinical Narrative:    3838: CSW spoke with pt daughter about DC plans for today, she stated her brother in law would pick pt up around 2pm. CSW contacted [facility] to update facility as well.        Expected Discharge Plan and Services           Expected Discharge Date: 01/08/21                                     Social Determinants of Health (SDOH) Interventions    Readmission Risk Interventions No flowsheet data found.

## 2021-01-08 NOTE — Progress Notes (Signed)
Physical Therapy Treatment Patient Details Name: [name] MRN: [id number] DOB: 1920/03/30 Today's Date: 01/08/2021    History of Present Illness Pt is a 87 y.o. male admitted from ALF on 12/31/20 with syncopal episode (+ LOC), fall and hitting head; pt reports similar episode 2 months prior, as well as worsening DOE and BLE edema. Workup revealed R posterior head lac post-staples, hypoxia, pulmonary edema, AKI. Head CT negative for acute injury. PMH includes afib on coumadin, HF, CKD IV.    PT Comments    Pt tolerates treatment well, ambulating for increased distances this session. Pt continues to require the use of supplemental oxygen with mobility and reports fatigue and SOB intermittently when ambulating. Pt expresses increased confidence in mobility and is eager to discharge home. Pt will continue to benefit from acute PT POC to further improve activity tolerance and to aide in a complete return to his prior level of function.  Follow Up Recommendations  Home health PT;Supervision - Intermittent     Equipment Recommendations  None recommended by PT    Recommendations for Other Services       Precautions / Restrictions Precautions Precautions: Fall;Other (comment) Precaution Comments: Watch SpO2 Restrictions Weight Bearing Restrictions: No    Mobility  Bed Mobility               General bed mobility comments: pt received on bedside commode and left seated in recliner    Transfers Overall transfer level: Needs assistance Equipment used: 4-wheeled walker;None Transfers: Sit to/from [facility] Sit to Stand: Min assist Stand pivot transfers: Min guard       General transfer comment: minA due to posterior lean when without 4 wheeled walker, minA to stabilize 4 wheeled walker during other sit to stand transfers  Ambulation/Gait Ambulation/Gait assistance: Min guard;Supervision (minG progressing to close supervision) Gait Distance (Feet): 140  Feet (additional trial of 80') Assistive device: 4-wheeled walker Gait Pattern/deviations: Step-through pattern Gait velocity: reduced Gait velocity interpretation: 1.31 - 2.62 ft/sec, indicative of limited community ambulator General Gait Details: pt with slowed step-through gait, reduced stride length, increased time to turn   Chief Strategy Officer    Modified Rankin (Stroke Patients Only)       Balance Overall balance assessment: Needs assistance Sitting-balance support: No upper extremity supported;Feet supported Sitting balance-Leahy Scale: Good     Standing balance support: Single extremity supported;Bilateral upper extremity supported Standing balance-Leahy Scale: Poor Standing balance comment: reliant on UE support of 4 wheeled walker                            Cognition Arousal/Alertness: Awake/alert Behavior During Therapy: WFL for tasks assessed/performed Overall Cognitive Status: Within Functional Limits for tasks assessed                                        Exercises      General Comments General comments (skin integrity, edema, etc.): pt on 1L [place] at rest upon PT arrival, PT weans pt to RA with pt desaturating to 85% with mobility, pt recovers to mid-90s on 2L [place] with mobility, weaned to 1L  at rest      Pertinent Vitals/Pain Pain Assessment: No/denies pain    Home Living  Prior Function            PT Goals (current goals can now be found in the care plan section) Acute Rehab PT Goals Patient Stated Goal: Return home Progress towards PT goals: Progressing toward goals    Frequency    Min 3X/week      PT Plan Current plan remains appropriate    Co-evaluation              AM-PAC PT "6 Clicks" Mobility   Outcome Measure  Help needed turning from your back to your side while in a flat bed without using bedrails?: None Help needed moving from lying  on your back to sitting on the side of a flat bed without using bedrails?: None Help needed moving to and from a bed to a chair (including a wheelchair)?: A Little Help needed standing up from a chair using your arms (e.g., wheelchair or bedside chair)?: A Little Help needed to walk in hospital room?: A Little Help needed climbing 3-5 steps with a railing? : A Lot 6 Click Score: 19    End of Session Equipment Utilized During Treatment: Oxygen Activity Tolerance: Patient tolerated treatment well Patient left: in chair;with call bell/phone within reach Nurse Communication: Mobility status PT Visit Diagnosis: Other abnormalities of gait and mobility (R26.89);Muscle weakness (generalized) (M62.81)     Time: 5035-4656 PT Time Calculation (min) (ACUTE ONLY): 27 min  Charges:  $Gait Training: 8-22 mins $Therapeutic Activity: 8-22 mins                     Zenaida Niece, PT, DPT Acute Rehabilitation Pager: [phone]    Zenaida Niece 01/08/2021, 1:51 PM

## 2021-01-08 NOTE — Progress Notes (Signed)
D/C instructions given and reviewed with pt and son. Tele and IV removed, tolerated well. Son at bedside to transport.

## 2021-01-08 NOTE — Plan of Care (Signed)

## 2021-01-08 NOTE — Progress Notes (Signed)
ANTICOAGULATION CONSULT NOTE - Follow Up Consult  Pharmacy Consult for Warfarin Indication: atrial fibrillation  Allergies  Allergen Reactions  . Penicillins     Unknown    Patient Measurements: Height: 5' (152.4 cm) Weight: 68 kg (150 lb) IBW/kg (Calculated) : 50  Vital Signs: Temp: 98.5 F (36.9 C) (06/01 0357) Temp Source: Oral (06/01 0357) BP: 114/53 (06/01 0357) Pulse Rate: 80 (06/01 0357)  Labs: Recent Labs    01/06/21 0215 01/07/21 0332 01/08/21 0454  HGB  --   --  10.4*  HCT  --   --  32.6*  PLT  --   --  PLATELET CLUMPS NOTED ON SMEAR, UNABLE TO ESTIMATE  LABPROT 29.0* 25.3* 26.0*  INR 2.7* 2.3* 2.4*  CREATININE 1.96* 1.76* 1.91*    Estimated Creatinine Clearance: 17.5 mL/min (A) (by C-G formula based on SCr of 1.91 mg/dL (H)).  Assessment: 87 yr old male admitted 5/24 after syncope, fall and LOC. Noted to have hit the back of his head and has scalp laceration, stapled.  No bleeding per CT head. Continues on Warfarin as PTA for atrial fibrillation.    PTA warfarin regimen per facility MAR: 1 mg MWF, 1.5 mg TTSS (9 mg weekly), since 11/09/20.   INR remains therapeutic at 2.4  Goal of Therapy:  INR 2-3 Monitor platelets by anticoagulation protocol: Yes   Plan:  Restart home dose 1 mg MWF, 1.5 mg TTSS Daily PT/INR Monitor for s/sx bleeding.  Thank you Anette Guarneri, PharmD 01/08/2021 8:12 AM  Please check [email or web address] for unit specific pharmacy phone numbers.

## 2021-01-08 NOTE — TOC Transition Note (Signed)
Transition of Care [facility]) - CM/SW Discharge Note   Patient Details  Name: [name] MRN: [id number] Date of Birth: 10-Feb-1920  [facility] CM/SW Contact:  Tresa Endo Phone Number: 01/08/2021, 12:09 PM   Clinical Narrative:    Patient will DC to: [facility] Anticipated DC date: 01/08/2021 Family notified: PT Daughter Transport by: Son in [place]   Per MD patient ready for DC to [facility]. RN to call report prior to discharge [phone]). RN, patient, patient's family, and facility notified of DC. Discharge Summary and FL2 sent to facility. DC packet on chart.   CSW will sign off for now as social work intervention is no longer needed. Please consult [place] again if new needs arise.            Patient Goals and CMS Choice        Discharge Placement                       Discharge Plan and Services                                     Social Determinants of Health (SDOH) Interventions     Readmission Risk Interventions No flowsheet data found.

## 2021-01-08 NOTE — Discharge Summary (Addendum)
Physician Discharge Summary  [name]:[id number] DOB: Aug 22, 1919 DOA: 12/31/2020  PCP: Merryl Hacker, No  Admit date: 12/31/2020 Discharge date: 01/08/2021  Admitted From: [facility] [place] Disposition:  [facility], [facility] [place]  Recommendations for Outpatient Follow-up:  1. Follow up with PCP in 3 days 2. Please obtain BMP/CBC in one week 3. Please continue to dose warfarin and adjust dosing as needed 4. Please to continue to encourage use incentive spirometer, and wean oxygen as tolerated  Home Health:YES Equipment/Devices:oxygen  Discharge Condition:Stable CODE STATUS:DNR Diet recommendation: Heart Healthy  Brief/Interim Summary:  [name] is a 87 year old male with past medical history significant for paroxysmal atrial fibrillation on Coumadin, chronic diastolic congestive heart failure, CKD stage IV, anemia due to CKD, HLD, hypothyroidism, depression, anxiety who presented to the ED with concerns of syncope and fall.  Patient was apparently trying to plug in his iPad but suddenly lost consciousness and fell backwards hitting his head on a bedside table.  Patient sustained laceration to his right posterior scalp.  Patient reports increasing dyspnea on exertion for the past 2-3 months with worsening chronic lower extremity edema.  Recently had his Lasix dose decreased due to worsening creatinine level.  Similar episode roughly 2 months ago when he was sitting up in bed.  Denies dizziness or lightheadedness.  No chest pain or palpitations   Acute hypoxic respiratory failure, POA Acute on chronic diastolic congestive heart failure - BNP, bibasilar effusions, and BLE edema consistent with HF exacerbation - Hypoxic on presentation with SPO2 86% on 4 L nasal cannula.   - TTE with LVEF 40-45%, mild LV hypokinesis, moderate inferior lateral wall hypokinesis, mild concentric LVH, RV function mildly reduced - Cardiology signing off, recommendations for: follow up with APP 2 to  4 weeks following discharge. Follow-up Dr. Johney Frame 3 months --Furosemide back to p.o. dosing 80 mg per cardiology --Not on ACE inhibitor/ARB/ARNI due to renal dysfunction --Continue supplemental oxygen, maintain SPO2 greater than 92%, on 2L [place], patient is hypoxic 87% on room air, requiring oxygen 2 L via nasal cannula on discharge  Syncope with loss of consciousness - Suspect secondary to prolonged hypoxia from diastolic CHF as above.   - TTE as above.  No concerning arrhythmias or block noted on telemetry. --PT/OT recommend home health on discharge  Incidentally positive COVID swab 1 out of 4 most likely related to lab error -Patient COVID-negative at intake, PCR 5/30 initially positive but subsequent confirmatory testing remains negative, repeat PCR this morning is negative as well, previous MD discussed with ID, recommendation to repeat swab x2, 24 hours after initial positive test, both of them came back negative, so most likely the initial positive test was related to an error, no need for isolation.  As he is having two negative COVID-19 test 24 hours apart  Acute renal failure on CKD stage IV, resolved Baseline creatinine 1.8.  On admission, creatinine elevated 2.55.  Etiology likely volume overload.    Creatinine is 1.9 on discharge.  Posterior scalp laceration -Following syncopal event at home, patient sustained posterior scalp laceration.  Underwent skin approximation by EDP with staples on 12/31/2020. -Staples to be removed 5/31, continue with local wound care and cleaning  Hypothyroidism TSH 5.933, free T4: 1.11.  --Increased levothyroxine from 88 to 100 mcg p.o. daily --Repeat thyroid function test in 4-6 weeks with PCP  Permanent atrial fibrillation --Metoprolol succinate 25 mg p.o. daily --Continue Coumadin, pharmacy consulted for dosing/monitoring (goal 2.0-3.0)  Ascending aorta dilation - Ascending aorta  noted 45 mm on TTE 2016. Patient showed normal aortic root  but ascending aorta was not well visualized.  Given his advanced age and comorbidities, not a candidate for surgical repair per cardiology.  Hyperlipidemia: Simvastatin 10 mg p.o. daily  Depression/anxiety - Sertraline 25 mg p.o. twice daily - Diazepam 2.5 mg p.o. every 6 hours as needed anxiety  GERD: Continue PPI  Constipation: Resolved - Patient reports bowel movement yesterday after receiving magnesium citrate - Senokot prn   Discharge Diagnoses:  Principal Problem:   Acute CHF (congestive heart failure) (HCC) Active Problems:   Syncope and collapse   Acute respiratory failure with hypoxia (HCC)   Acute-on-chronic kidney injury ([place])   Scalp laceration   Unspecified atrial fibrillation (HCC)   HLD (hyperlipidemia)   Hypothyroidism    Discharge Instructions  Discharge Instructions    Diet - low sodium heart healthy   Complete by: As directed    Discharge wound care:   Complete by: As directed    Please clean scalp woaund daily with water, and apply dressing daily   Increase activity slowly   Complete by: As directed      Allergies as of 01/08/2021      Reactions   Penicillins    Unknown      Medication List    TAKE these medications   acetaminophen 325 MG tablet Commonly known as: TYLENOL Take 650 mg by mouth 2 (two) times daily.   diazepam 5 MG tablet Commonly known as: VALIUM Take 0.5 tablets (2.5 mg total) by mouth every 6 (six) hours as needed for anxiety.   furosemide 80 MG tablet Commonly known as: LASIX Take 1 tablet (80 mg total) by mouth daily. Start taking on: January 09, 2021 What changed:   medication strength  how much to take  when to take this  additional instructions   levothyroxine 88 MCG tablet Commonly known as: SYNTHROID Take 88 mcg by mouth daily before breakfast.   loratadine 10 MG tablet Commonly known as: CLARITIN Take 10 mg by mouth daily.   metoprolol succinate 25 MG 24 hr tablet Commonly known as:  TOPROL-XL Take 25 mg by mouth daily.   omeprazole 20 MG capsule Commonly known as: PRILOSEC Take 20 mg by mouth daily.   sertraline 25 MG tablet Commonly known as: ZOLOFT Take 25 mg by mouth in the morning and at bedtime.   simvastatin 10 MG tablet Commonly known as: ZOCOR Take 10 mg by mouth daily.   Vitamin D3 250 MCG (10000 UT) Tabs Take 1 tablet by mouth daily.   warfarin 1 MG tablet Commonly known as: COUMADIN Take 1-1.5 mg by mouth See admin instructions. Take 1 mg tablet by mouth on Mon,Wed, Friday, then take 1.5 mg tablet by mouth on Sun,Tue,Thu,Sat            Durable Medical Equipment  (From admission, onward)         Start     Ordered   01/08/21 1151  For home use only DME oxygen  Once       Question Answer Comment  Length of Need 6 Months   Mode or (Route) Nasal cannula   Liters per Minute 2   Frequency Continuous (stationary and portable oxygen unit needed)   Oxygen conserving device Yes   Oxygen delivery system Gas      01/08/21 1150   01/05/21 1344  For home use only DME oxygen  Once       Question Answer Comment  Length of Need Lifetime   Mode or (Route) Nasal cannula   Liters per Minute 2   Frequency Continuous (stationary and portable oxygen unit needed)   Oxygen conserving device Yes   Oxygen delivery system Gas      01/05/21 1343   01/01/21 1249  For home use only DME 3 n 1  Once        01/01/21 1248           Discharge Care Instructions  (From admission, onward)         Start     Ordered   01/08/21 0000  Discharge wound care:       Comments: Please clean scalp woaund daily with water, and apply dressing daily   01/08/21 1155          Follow-up Information    Freada Bergeron, MD Follow up on 02/03/2021.   Specialties: Cardiology, Radiology Why: at 9:15 AM with her Nurse Practitioner- Donnetta Hail information: [address] [place]  [address] [phone]              Allergies   Allergen Reactions  . Penicillins     Unknown    Consultations:  Cardiology   Procedures/Studies: CT Head Wo Contrast  Result Date: 12/31/2020 CLINICAL DATA:  87 year old male with head trauma. EXAM: CT HEAD WITHOUT CONTRAST CT CERVICAL SPINE WITHOUT CONTRAST TECHNIQUE: Multidetector CT imaging of the head and cervical spine was performed following the standard protocol without intravenous contrast. Multiplanar CT image reconstructions of the cervical spine were also generated. COMPARISON:  None. FINDINGS: CT HEAD FINDINGS Brain: Mild age-related atrophy and chronic microvascular ischemic changes. There is no acute intracranial hemorrhage. No mass effect or midline shift. No extra-axial fluid collection. Vascular: No hyperdense vessel or unexpected calcification. Skull: Normal. Negative for fracture or focal lesion. Sinuses/Orbits: No acute finding. Other: Right posterior parietal scalp laceration. No large hematoma. CT CERVICAL SPINE FINDINGS Alignment: No acute subluxation. Skull base and vertebrae: No acute fracture. Osteopenia. Soft tissues and spinal canal: No prevertebral fluid or swelling. No visible canal hematoma. Disc levels: Multilevel degenerative changes with disc space narrowing and endplate irregularity. Upper chest: Partially visualized bilateral pleural effusions. There is diffuse interstitial and interlobular thickening, likely edema. Clinical correlation is recommended. Other: Bilateral carotid bulb calcified plaques. IMPRESSION: 1. No acute intracranial pathology. Age-related atrophy and chronic microvascular ischemic changes. 2. No acute/traumatic cervical spine pathology. Multilevel degenerative changes. 3. Partially visualized bilateral pleural effusions and findings of pulmonary edema. Clinical correlation is recommended. Electronically Signed   By: Anner Crete M.D.   On: 12/31/2020 17:14   CT Cervical Spine Wo Contrast  Result Date: 12/31/2020 CLINICAL DATA:   87 year old male with head trauma. EXAM: CT HEAD WITHOUT CONTRAST CT CERVICAL SPINE WITHOUT CONTRAST TECHNIQUE: Multidetector CT imaging of the head and cervical spine was performed following the standard protocol without intravenous contrast. Multiplanar CT image reconstructions of the cervical spine were also generated. COMPARISON:  None. FINDINGS: CT HEAD FINDINGS Brain: Mild age-related atrophy and chronic microvascular ischemic changes. There is no acute intracranial hemorrhage. No mass effect or midline shift. No extra-axial fluid collection. Vascular: No hyperdense vessel or unexpected calcification. Skull: Normal. Negative for fracture or focal lesion. Sinuses/Orbits: No acute finding. Other: Right posterior parietal scalp laceration. No large hematoma. CT CERVICAL SPINE FINDINGS Alignment: No acute subluxation. Skull base and vertebrae: No acute fracture. Osteopenia. Soft tissues and spinal canal: No prevertebral fluid or swelling. No visible canal hematoma.  Disc levels: Multilevel degenerative changes with disc space narrowing and endplate irregularity. Upper chest: Partially visualized bilateral pleural effusions. There is diffuse interstitial and interlobular thickening, likely edema. Clinical correlation is recommended. Other: Bilateral carotid bulb calcified plaques. IMPRESSION: 1. No acute intracranial pathology. Age-related atrophy and chronic microvascular ischemic changes. 2. No acute/traumatic cervical spine pathology. Multilevel degenerative changes. 3. Partially visualized bilateral pleural effusions and findings of pulmonary edema. Clinical correlation is recommended. Electronically Signed   By: Anner Crete M.D.   On: 12/31/2020 17:14   DG Pelvis Portable  Result Date: 12/31/2020 CLINICAL DATA:  Fall. EXAM: PORTABLE PELVIS 1-2 VIEWS COMPARISON:  None. FINDINGS: There is no evidence of pelvic fracture or diastasis. No pelvic bone lesions are seen. IMPRESSION: Negative. Electronically  Signed   By: Marijo Conception M.D.   On: 12/31/2020 16:48   DG Chest Port 1 View  Result Date: 12/31/2020 CLINICAL DATA:  87 year old male with fall. EXAM: PORTABLE CHEST 1 VIEW COMPARISON:  Chest radiograph dated 04/17/2020. FINDINGS: Shallow inspiration. Bilateral interstitial coarsening and hazy airspace densities primarily involving the mid to lower lung field and subpleural region of the lungs, progressed since the prior radiograph and may represent progression of underlying fibrosis or sequela of prior inflammatory/infectious process including prior COVID-19 infection. Clinical correlation is recommended. There is no pleural effusion pneumothorax. Stable cardiac silhouette. Median sternotomy wires and CABG vascular clips. No acute osseous pathology. IMPRESSION: Progression of bilateral pulmonary densities, likely worsening fibrosis or sequela of prior inflammatory/infectious process. Electronically Signed   By: Anner Crete M.D.   On: 12/31/2020 16:48   ECHOCARDIOGRAM COMPLETE  Result Date: 01/01/2021    ECHOCARDIOGRAM REPORT   Patient Name:   [name] Date of Exam: 01/01/2021 Medical Rec #:  [id number]         Height:       60.0 in Accession #:    [id number]        Weight:       149.7 lb Date of Birth:  03/24/1920         BSA:          1.650 m Patient Age:    40 years          BP:           117/70 mmHg Patient Gender: M                 HR:           99 bpm. Exam Location:  Inpatient Procedure: 2D Echo, Cardiac Doppler and Color Doppler Indications:    CHF  History:        Patient has no prior history of Echocardiogram examinations.                 CHF, CAD and LA myxoma, Prior CABG, Aortic Valve Disease,                 Arrythmias:Atrial Fibrillation, Signs/Symptoms:Shortness of                 Breath; Risk Factors:Hypertension. CKD.  Sonographer:    Dustin Flock Referring Phys: [id number] [place]  1. There appears to be global left ventricular mild hypokinesis, but there is  moderate inferolateral wall hypokinesis. Left ventricular ejection fraction, by estimation, is 40 to 45%. The left ventricle has mildly decreased function. The left ventricle demonstrates regional wall motion abnormalities (see scoring diagram/findings for description). There is mild concentric left ventricular hypertrophy. Left ventricular  diastolic function could not be evaluated.  2. Right ventricular systolic function is moderately reduced. The right ventricular size is mildly enlarged. There is moderately elevated pulmonary artery systolic pressure.  3. No left atrial mass is seen. Left atrial size was severely dilated.  4. Right atrial size was moderately dilated.  5. The mitral valve is degenerative. Mild mitral valve regurgitation. No evidence of mitral stenosis.  6. Tricuspid valve regurgitation is mild to moderate.  7. The aortic valve is tricuspid. There is mild calcification of the aortic valve. There is moderate thickening of the aortic valve. Aortic valve regurgitation is mild to moderate. Mild to moderate aortic valve sclerosis/calcification is present, without any evidence of aortic stenosis. Comparison(s): Prior images unable to be directly viewed, comparison made by report only. The left ventricular function is worsened. The right ventricular hypertrophy is worse. FINDINGS  Left Ventricle: There appears to be global left ventricular mild hypokinesis, but there is moderate inferolateral wall hypokinesis. Left ventricular ejection fraction, by estimation, is 40 to 45%. The left ventricle has mildly decreased function. The left ventricle demonstrates regional wall motion abnormalities. The left ventricular internal cavity size was normal in size. There is mild concentric left ventricular hypertrophy. Abnormal (paradoxical) septal motion consistent with post-operative status. Left ventricular diastolic function could not be evaluated due to atrial fibrillation. Left ventricular diastolic function could  not be evaluated. Right Ventricle: The right ventricular size is mildly enlarged. No increase in right ventricular wall thickness. Right ventricular systolic function is moderately reduced. There is moderately elevated pulmonary artery systolic pressure. The tricuspid regurgitant velocity is 3.29 m/s, and with an assumed right atrial pressure of 5 mmHg, the estimated right ventricular systolic pressure is 01.6 mmHg. Left Atrium: No left atrial mass is seen. Left atrial size was severely dilated. Right Atrium: Right atrial size was moderately dilated. Pericardium: There is no evidence of pericardial effusion. Mitral Valve: The mitral valve is degenerative in appearance. There is mild thickening of the mitral valve leaflet(s). Mild to moderate mitral annular calcification. Mild mitral valve regurgitation, with centrally-directed jet. No evidence of mitral valve stenosis. Tricuspid Valve: The tricuspid valve is normal in structure. Tricuspid valve regurgitation is mild to moderate. Aortic Valve: The aortic valve is tricuspid. There is mild calcification of the aortic valve. There is moderate thickening of the aortic valve. Aortic valve regurgitation is mild to moderate. Aortic regurgitation PHT measures 492 msec. Mild to moderate aortic valve sclerosis/calcification is present, without any evidence of aortic stenosis. Aortic valve mean gradient measures 4.0 mmHg. Aortic valve peak gradient measures 8.2 mmHg. Aortic valve area, by VTI measures 3.35 cm. Pulmonic Valve: The pulmonic valve was not well visualized. Pulmonic valve regurgitation is not visualized. Aorta: The aortic root is normal in size and structure and the ascending aorta was not well visualized. Venous: The inferior vena cava was not well visualized. IAS/Shunts: No atrial level shunt detected by color flow Doppler. There is no evidence of an atrial septal defect.  LEFT VENTRICLE PLAX 2D LVIDd:         4.60 cm  Diastology LVIDs:         3.20 cm  LV e'  medial:    2.51 cm/s LV PW:         1.30 cm  LV E/e' medial:  29.2 LV IVS:        1.20 cm  LV e' lateral:   3.16 cm/s LVOT diam:     2.30 cm  LV E/e' lateral: 23.2 LV  SV:         85 LV SV Index:   52 LVOT Area:     4.15 cm  RIGHT VENTRICLE RV Basal diam:  4.40 cm RV S prime:     6.24 cm/s TAPSE (M-mode): 1.6 cm LEFT ATRIUM             Index       RIGHT ATRIUM           Index LA diam:        5.00 cm 3.03 cm/m  RA Area:     24.30 cm LA Vol (A2C):   52.9 ml 32.05 ml/m RA Volume:   77.00 ml  46.66 ml/m LA Vol (A4C):   66.0 ml 39.99 ml/m LA Biplane Vol: 61.6 ml 37.33 ml/m  AORTIC VALVE AV Area (Vmax):    3.13 cm AV Area (Vmean):   3.27 cm AV Area (VTI):     3.35 cm AV Vmax:           143.50 cm/s AV Vmean:          93.100 cm/s AV VTI:            0.254 m AV Peak Grad:      8.2 mmHg AV Mean Grad:      4.0 mmHg LVOT Vmax:         108.00 cm/s LVOT Vmean:        73.200 cm/s LVOT VTI:          0.205 m LVOT/AV VTI ratio: 0.81 AI PHT:            492 msec  AORTA Ao Root diam: 5.40 cm MITRAL VALVE               TRICUSPID VALVE MV Area (PHT): 4.06 cm    TR Peak grad:   43.3 mmHg MV Decel Time: 187 msec    TR Vmax:        329.00 cm/s MV E velocity: 73.20 cm/s MV A velocity: 42.70 cm/s  SHUNTS MV E/A ratio:  1.71        Systemic VTI:  0.20 m                            Systemic Diam: 2.30 cm Mihai Croitoru MD Electronically signed by Sanda Klein MD Signature Date/Time: 01/01/2021/2:20:04 PM    Final    Subjective:   Discharge Exam: Vitals:   01/08/21 0357 01/08/21 0800  BP: (!) 114/53 127/82  Pulse: 80 100  Resp: 18 20  Temp: 98.5 F (36.9 C)   SpO2: 98% 100%   Vitals:   01/07/21 1952 01/08/21 0357 01/08/21 0515 01/08/21 0800  BP: 104/69 (!) 114/53  127/82  Pulse: 82 80  100  Resp: 18 18  20   Temp: 98.4 F (36.9 C) 98.5 F (36.9 C)    TempSrc: Oral Oral    SpO2: 100% 98%  100%  Weight:   68 kg   Height:        General: Pt is alert, awake, not in acute distress, frail and deconditioned scalp  wound with no staples present, dried blood present, with no active oozing or discharge. Cardiovascular: RRR, S1/S2 +, no rubs, no gallops Respiratory: CTA bilaterally, no wheezing, no rhonchi Abdominal: Soft, NT, ND, bowel sounds + Extremities: no edema, no cyanosis    The results of significant diagnostics from this hospitalization (including imaging, microbiology, ancillary and laboratory) are  listed below for reference.     Microbiology: Recent Results (from the past 240 hour(s))  Resp Panel by RT-PCR (Flu A&B, Covid) Nasopharyngeal Swab     Status: None   Collection Time: 12/31/20  4:46 PM   Specimen: Nasopharyngeal Swab; Nasopharyngeal(NP) swabs in vial transport medium  Result Value Ref Range Status   SARS Coronavirus 2 by RT PCR NEGATIVE NEGATIVE Final    Comment: (NOTE) SARS-CoV-2 target nucleic acids are NOT DETECTED.  The SARS-CoV-2 RNA is generally detectable in upper respiratory specimens during the acute phase of infection. The lowest concentration of SARS-CoV-2 viral copies this assay can detect is 138 copies/mL. A negative result does not preclude SARS-Cov-2 infection and should not be used as the sole basis for treatment or other patient management decisions. A negative result may occur with  improper specimen collection/handling, submission of specimen other than nasopharyngeal swab, presence of viral mutation(s) within the areas targeted by this assay, and inadequate number of viral copies(<138 copies/mL). A negative result must be combined with clinical observations, patient history, and epidemiological information. The expected result is Negative.  Fact Sheet for Patients:  EntrepreneurPulse.com.au  Fact Sheet for Healthcare Providers:  IncredibleEmployment.be  This test is no t yet approved or cleared by the [place] FDA and  has been authorized for detection and/or diagnosis of SARS-CoV-2 by FDA under an Emergency  Use Authorization (EUA). This EUA will remain  in effect (meaning this test can be used) for the duration of the COVID-19 declaration under Section 564(b)(1) of the Act, 21 U.S.C.section 360bbb-3(b)(1), unless the authorization is terminated  or revoked sooner.       Influenza A by PCR NEGATIVE NEGATIVE Final   Influenza B by PCR NEGATIVE NEGATIVE Final    Comment: (NOTE) The Xpert Xpress SARS-CoV-2/FLU/RSV plus assay is intended as an aid in the diagnosis of influenza from Nasopharyngeal swab specimens and should not be used as a sole basis for treatment. Nasal washings and aspirates are unacceptable for Xpert Xpress SARS-CoV-2/FLU/RSV testing.  Fact Sheet for Patients: EntrepreneurPulse.com.au  Fact Sheet for Healthcare Providers: IncredibleEmployment.be  This test is not yet approved or cleared by the [place] FDA and has been authorized for detection and/or diagnosis of SARS-CoV-2 by FDA under an Emergency Use Authorization (EUA). This EUA will remain in effect (meaning this test can be used) for the duration of the COVID-19 declaration under Section 564(b)(1) of the Act, 21 U.S.C. section 360bbb-3(b)(1), unless the authorization is terminated or revoked.  Performed at [facility], [place] [address], [place], [place] [address]   SARS CORONAVIRUS 2 (TAT 6-24 HRS) Nasopharyngeal Nasopharyngeal Swab     Status: Abnormal   Collection Time: 01/06/21 10:11 AM   Specimen: Nasopharyngeal Swab  Result Value Ref Range Status   SARS Coronavirus 2 POSITIVE (A) NEGATIVE Final    Comment: (NOTE) SARS-CoV-2 target nucleic acids are DETECTED.  The SARS-CoV-2 RNA is generally detectable in upper and lower respiratory specimens during the acute phase of infection. Positive results are indicative of the presence of SARS-CoV-2 RNA. Clinical correlation with patient history and other diagnostic information is  necessary to determine patient  infection status. Positive results do not rule out bacterial infection or co-infection with other viruses.  The expected result is Negative.  Fact Sheet for Patients: SugarRoll.be  Fact Sheet for Healthcare Providers: https://www.woods-mathews.com/  This test is not yet approved or cleared by the [place] FDA and  has been authorized for detection and/or diagnosis of SARS-CoV-2  by FDA under an Emergency Use Authorization (EUA). This EUA will remain  in effect (meaning this test can be used) for the duration of the COVID-19 declaration under Section 564(b)(1) of the Act, 21 U. S.C. section 360bbb-3(b)(1), unless the authorization is terminated or revoked sooner.   Performed at [facility], [place] [address], [place], [place] [address]   Resp Panel by RT-PCR (Flu A&B, Covid) Nasopharyngeal Swab     Status: None   Collection Time: 01/06/21  6:32 PM   Specimen: Nasopharyngeal Swab; Nasopharyngeal(NP) swabs in vial transport medium  Result Value Ref Range Status   SARS Coronavirus 2 by RT PCR NEGATIVE NEGATIVE Final    Comment: (NOTE) SARS-CoV-2 target nucleic acids are NOT DETECTED.  The SARS-CoV-2 RNA is generally detectable in upper respiratory specimens during the acute phase of infection. The lowest concentration of SARS-CoV-2 viral copies this assay can detect is 138 copies/mL. A negative result does not preclude SARS-Cov-2 infection and should not be used as the sole basis for treatment or other patient management decisions. A negative result may occur with  improper specimen collection/handling, submission of specimen other than nasopharyngeal swab, presence of viral mutation(s) within the areas targeted by this assay, and inadequate number of viral copies(<138 copies/mL). A negative result must be combined with clinical observations, patient history, and epidemiological information. The expected result is Negative.  Fact  Sheet for Patients:  EntrepreneurPulse.com.au  Fact Sheet for Healthcare Providers:  IncredibleEmployment.be  This test is no t yet approved or cleared by the [place] FDA and  has been authorized for detection and/or diagnosis of SARS-CoV-2 by FDA under an Emergency Use Authorization (EUA). This EUA will remain  in effect (meaning this test can be used) for the duration of the COVID-19 declaration under Section 564(b)(1) of the Act, 21 U.S.C.section 360bbb-3(b)(1), unless the authorization is terminated  or revoked sooner.       Influenza A by PCR NEGATIVE NEGATIVE Final   Influenza B by PCR NEGATIVE NEGATIVE Final    Comment: (NOTE) The Xpert Xpress SARS-CoV-2/FLU/RSV plus assay is intended as an aid in the diagnosis of influenza from Nasopharyngeal swab specimens and should not be used as a sole basis for treatment. Nasal washings and aspirates are unacceptable for Xpert Xpress SARS-CoV-2/FLU/RSV testing.  Fact Sheet for Patients: EntrepreneurPulse.com.au  Fact Sheet for Healthcare Providers: IncredibleEmployment.be  This test is not yet approved or cleared by the [place] FDA and has been authorized for detection and/or diagnosis of SARS-CoV-2 by FDA under an Emergency Use Authorization (EUA). This EUA will remain in effect (meaning this test can be used) for the duration of the COVID-19 declaration under Section 564(b)(1) of the Act, 21 U.S.C. section 360bbb-3(b)(1), unless the authorization is terminated or revoked.  Performed at [facility], [place] [address], [place], [place] [address]   Resp Panel by RT-PCR (Flu A&B, Covid) Nasopharyngeal Swab     Status: None   Collection Time: 01/08/21  8:50 AM   Specimen: Nasopharyngeal Swab; Nasopharyngeal(NP) swabs in vial transport medium  Result Value Ref Range Status   SARS Coronavirus 2 by RT PCR NEGATIVE NEGATIVE Final    Comment:  (NOTE) SARS-CoV-2 target nucleic acids are NOT DETECTED.  The SARS-CoV-2 RNA is generally detectable in upper respiratory specimens during the acute phase of infection. The lowest concentration of SARS-CoV-2 viral copies this assay can detect is 138 copies/mL. A negative result does not preclude SARS-Cov-2 infection and should not be used as the  sole basis for treatment or other patient management decisions. A negative result may occur with  improper specimen collection/handling, submission of specimen other than nasopharyngeal swab, presence of viral mutation(s) within the areas targeted by this assay, and inadequate number of viral copies(<138 copies/mL). A negative result must be combined with clinical observations, patient history, and epidemiological information. The expected result is Negative.  Fact Sheet for Patients:  EntrepreneurPulse.com.au  Fact Sheet for Healthcare Providers:  IncredibleEmployment.be  This test is no t yet approved or cleared by the [place] FDA and  has been authorized for detection and/or diagnosis of SARS-CoV-2 by FDA under an Emergency Use Authorization (EUA). This EUA will remain  in effect (meaning this test can be used) for the duration of the COVID-19 declaration under Section 564(b)(1) of the Act, 21 U.S.C.section 360bbb-3(b)(1), unless the authorization is terminated  or revoked sooner.       Influenza A by PCR NEGATIVE NEGATIVE Final   Influenza B by PCR NEGATIVE NEGATIVE Final    Comment: (NOTE) The Xpert Xpress SARS-CoV-2/FLU/RSV plus assay is intended as an aid in the diagnosis of influenza from Nasopharyngeal swab specimens and should not be used as a sole basis for treatment. Nasal washings and aspirates are unacceptable for Xpert Xpress SARS-CoV-2/FLU/RSV testing.  Fact Sheet for Patients: EntrepreneurPulse.com.au  Fact Sheet for Healthcare  Providers: IncredibleEmployment.be  This test is not yet approved or cleared by the [place] FDA and has been authorized for detection and/or diagnosis of SARS-CoV-2 by FDA under an Emergency Use Authorization (EUA). This EUA will remain in effect (meaning this test can be used) for the duration of the COVID-19 declaration under Section 564(b)(1) of the Act, 21 U.S.C. section 360bbb-3(b)(1), unless the authorization is terminated or revoked.  Performed at [facility], [place] [address] [place], [place] [address]      Labs: BNP (last 3 results) Recent Labs    12/31/20 1646  BNP 790.2*   Basic Metabolic Panel: Recent Labs  Lab 01/04/21 0212 01/05/21 0639 01/06/21 0215 01/07/21 0332 01/08/21 0454  NA 134* 135 134* 134* 133*  K 3.7 3.8 4.0 4.0 4.1  CL 97* 98 96* 95* 93*  CO2 30 31 32 30 30  GLUCOSE 102* 98 103* 96 102*  BUN 45* 45* 47* 46* 48*  CREATININE 1.83* 1.93* 1.96* 1.76* 1.91*  CALCIUM 8.3* 8.4* 8.6* 8.5* 8.7*   Liver Function Tests: No results for input(s): AST, ALT, ALKPHOS, BILITOT, PROT, ALBUMIN in the last 168 hours. No results for input(s): LIPASE, AMYLASE in the last 168 hours. No results for input(s): AMMONIA in the last 168 hours. CBC: Recent Labs  Lab 01/04/21 0212 01/05/21 0639 01/08/21 0454  WBC 6.9 6.1 6.4  HGB 10.3* 9.9* 10.4*  HCT 32.2* 31.4* 32.6*  MCV 94.2 94.6 95.0  PLT 99* 102* PLATELET CLUMPS NOTED ON SMEAR, UNABLE TO ESTIMATE   Cardiac Enzymes: No results for input(s): CKTOTAL, CKMB, CKMBINDEX, TROPONINI in the last 168 hours. BNP: Invalid input(s): POCBNP CBG: No results for input(s): GLUCAP in the last 168 hours. D-Dimer No results for input(s): DDIMER in the last 72 hours. Hgb A1c No results for input(s): HGBA1C in the last 72 hours. Lipid Profile No results for input(s): CHOL, HDL, LDLCALC, TRIG, CHOLHDL, LDLDIRECT in the last 72 hours. Thyroid function studies No results for input(s): TSH,  T4TOTAL, T3FREE, THYROIDAB in the last 72 hours.  Invalid input(s): FREET3 Anemia work up No results for input(s): VITAMINB12, FOLATE, FERRITIN, TIBC, IRON, RETICCTPCT  in the last 72 hours. Urinalysis No results found for: COLORURINE, APPEARANCEUR, [place], [place], GLUCOSEU, [place], [place], [place], PROTEINUR, UROBILINOGEN, NITRITE, LEUKOCYTESUR Sepsis Labs Invalid input(s): PROCALCITONIN,  WBC,  LACTICIDVEN Microbiology Recent Results (from the past 240 hour(s))  Resp Panel by RT-PCR (Flu A&B, Covid) Nasopharyngeal Swab     Status: None   Collection Time: 12/31/20  4:46 PM   Specimen: Nasopharyngeal Swab; Nasopharyngeal(NP) swabs in vial transport medium  Result Value Ref Range Status   SARS Coronavirus 2 by RT PCR NEGATIVE NEGATIVE Final    Comment: (NOTE) SARS-CoV-2 target nucleic acids are NOT DETECTED.  The SARS-CoV-2 RNA is generally detectable in upper respiratory specimens during the acute phase of infection. The lowest concentration of SARS-CoV-2 viral copies this assay can detect is 138 copies/mL. A negative result does not preclude SARS-Cov-2 infection and should not be used as the sole basis for treatment or other patient management decisions. A negative result may occur with  improper specimen collection/handling, submission of specimen other than nasopharyngeal swab, presence of viral mutation(s) within the areas targeted by this assay, and inadequate number of viral copies(<138 copies/mL). A negative result must be combined with clinical observations, patient history, and epidemiological information. The expected result is Negative.  Fact Sheet for Patients:  EntrepreneurPulse.com.au  Fact Sheet for Healthcare Providers:  IncredibleEmployment.be  This test is no t yet approved or cleared by the [place] FDA and  has been authorized for detection and/or diagnosis of SARS-CoV-2 by FDA under an Emergency Use  Authorization (EUA). This EUA will remain  in effect (meaning this test can be used) for the duration of the COVID-19 declaration under Section 564(b)(1) of the Act, 21 U.S.C.section 360bbb-3(b)(1), unless the authorization is terminated  or revoked sooner.       Influenza A by PCR NEGATIVE NEGATIVE Final   Influenza B by PCR NEGATIVE NEGATIVE Final    Comment: (NOTE) The Xpert Xpress SARS-CoV-2/FLU/RSV plus assay is intended as an aid in the diagnosis of influenza from Nasopharyngeal swab specimens and should not be used as a sole basis for treatment. Nasal washings and aspirates are unacceptable for Xpert Xpress SARS-CoV-2/FLU/RSV testing.  Fact Sheet for Patients: EntrepreneurPulse.com.au  Fact Sheet for Healthcare Providers: IncredibleEmployment.be  This test is not yet approved or cleared by the [place] FDA and has been authorized for detection and/or diagnosis of SARS-CoV-2 by FDA under an Emergency Use Authorization (EUA). This EUA will remain in effect (meaning this test can be used) for the duration of the COVID-19 declaration under Section 564(b)(1) of the Act, 21 U.S.C. section 360bbb-3(b)(1), unless the authorization is terminated or revoked.  Performed at [facility], [place] [address], [place], [place] [address]   SARS CORONAVIRUS 2 (TAT 6-24 HRS) Nasopharyngeal Nasopharyngeal Swab     Status: Abnormal   Collection Time: 01/06/21 10:11 AM   Specimen: Nasopharyngeal Swab  Result Value Ref Range Status   SARS Coronavirus 2 POSITIVE (A) NEGATIVE Final    Comment: (NOTE) SARS-CoV-2 target nucleic acids are DETECTED.  The SARS-CoV-2 RNA is generally detectable in upper and lower respiratory specimens during the acute phase of infection. Positive results are indicative of the presence of SARS-CoV-2 RNA. Clinical correlation with patient history and other diagnostic information is  necessary to determine patient  infection status. Positive results do not rule out bacterial infection or co-infection with other viruses.  The expected result is Negative.  Fact Sheet for Patients: SugarRoll.be  Fact Sheet for Healthcare Providers: https://www.woods-mathews.com/  This  test is not yet approved or cleared by the [place] and  has been authorized for detection and/or diagnosis of SARS-CoV-2 by FDA under an Emergency Use Authorization (EUA). This EUA will remain  in effect (meaning this test can be used) for the duration of the COVID-19 declaration under Section 564(b)(1) of the Act, 21 U. S.C. section 360bbb-3(b)(1), unless the authorization is terminated or revoked sooner.   Performed at [facility], [place] [address] [place], [place] [address]   Resp Panel by RT-PCR (Flu A&B, Covid) Nasopharyngeal Swab     Status: None   Collection Time: 01/06/21  6:32 PM   Specimen: Nasopharyngeal Swab; Nasopharyngeal(NP) swabs in vial transport medium  Result Value Ref Range Status   SARS Coronavirus 2 by RT PCR NEGATIVE NEGATIVE Final    Comment: (NOTE) SARS-CoV-2 target nucleic acids are NOT DETECTED.  The SARS-CoV-2 RNA is generally detectable in upper respiratory specimens during the acute phase of infection. The lowest concentration of SARS-CoV-2 viral copies this assay can detect is 138 copies/mL. A negative result does not preclude SARS-Cov-2 infection and should not be used as the sole basis for treatment or other patient management decisions. A negative result may occur with  improper specimen collection/handling, submission of specimen other than nasopharyngeal swab, presence of viral mutation(s) within the areas targeted by this assay, and inadequate number of viral copies(<138 copies/mL). A negative result must be combined with clinical observations, patient history, and epidemiological information. The expected result is Negative.  Fact  Sheet for Patients:  EntrepreneurPulse.com.au  Fact Sheet for Healthcare Providers:  IncredibleEmployment.be  This test is no t yet approved or cleared by the [place] FDA and  has been authorized for detection and/or diagnosis of SARS-CoV-2 by FDA under an Emergency Use Authorization (EUA). This EUA will remain  in effect (meaning this test can be used) for the duration of the COVID-19 declaration under Section 564(b)(1) of the Act, 21 U.S.C.section 360bbb-3(b)(1), unless the authorization is terminated  or revoked sooner.       Influenza A by PCR NEGATIVE NEGATIVE Final   Influenza B by PCR NEGATIVE NEGATIVE Final    Comment: (NOTE) The Xpert Xpress SARS-CoV-2/FLU/RSV plus assay is intended as an aid in the diagnosis of influenza from Nasopharyngeal swab specimens and should not be used as a sole basis for treatment. Nasal washings and aspirates are unacceptable for Xpert Xpress SARS-CoV-2/FLU/RSV testing.  Fact Sheet for Patients: EntrepreneurPulse.com.au  Fact Sheet for Healthcare Providers: IncredibleEmployment.be  This test is not yet approved or cleared by the [place] FDA and has been authorized for detection and/or diagnosis of SARS-CoV-2 by FDA under an Emergency Use Authorization (EUA). This EUA will remain in effect (meaning this test can be used) for the duration of the COVID-19 declaration under Section 564(b)(1) of the Act, 21 U.S.C. section 360bbb-3(b)(1), unless the authorization is terminated or revoked.  Performed at [facility], [place] [address], [place], [place] [address]   Resp Panel by RT-PCR (Flu A&B, Covid) Nasopharyngeal Swab     Status: None   Collection Time: 01/08/21  8:50 AM   Specimen: Nasopharyngeal Swab; Nasopharyngeal(NP) swabs in vial transport medium  Result Value Ref Range Status   SARS Coronavirus 2 by RT PCR NEGATIVE NEGATIVE Final    Comment:  (NOTE) SARS-CoV-2 target nucleic acids are NOT DETECTED.  The SARS-CoV-2 RNA is generally detectable in upper respiratory specimens during the acute phase of infection. The lowest concentration of SARS-CoV-2 viral  copies this assay can detect is 138 copies/mL. A negative result does not preclude SARS-Cov-2 infection and should not be used as the sole basis for treatment or other patient management decisions. A negative result may occur with  improper specimen collection/handling, submission of specimen other than nasopharyngeal swab, presence of viral mutation(s) within the areas targeted by this assay, and inadequate number of viral copies(<138 copies/mL). A negative result must be combined with clinical observations, patient history, and epidemiological information. The expected result is Negative.  Fact Sheet for Patients:  EntrepreneurPulse.com.au  Fact Sheet for Healthcare Providers:  IncredibleEmployment.be  This test is no t yet approved or cleared by the [place] FDA and  has been authorized for detection and/or diagnosis of SARS-CoV-2 by FDA under an Emergency Use Authorization (EUA). This EUA will remain  in effect (meaning this test can be used) for the duration of the COVID-19 declaration under Section 564(b)(1) of the Act, 21 U.S.C.section 360bbb-3(b)(1), unless the authorization is terminated  or revoked sooner.       Influenza A by PCR NEGATIVE NEGATIVE Final   Influenza B by PCR NEGATIVE NEGATIVE Final    Comment: (NOTE) The Xpert Xpress SARS-CoV-2/FLU/RSV plus assay is intended as an aid in the diagnosis of influenza from Nasopharyngeal swab specimens and should not be used as a sole basis for treatment. Nasal washings and aspirates are unacceptable for Xpert Xpress SARS-CoV-2/FLU/RSV testing.  Fact Sheet for Patients: EntrepreneurPulse.com.au  Fact Sheet for Healthcare  Providers: IncredibleEmployment.be  This test is not yet approved or cleared by the [place] FDA and has been authorized for detection and/or diagnosis of SARS-CoV-2 by FDA under an Emergency Use Authorization (EUA). This EUA will remain in effect (meaning this test can be used) for the duration of the COVID-19 declaration under Section 564(b)(1) of the Act, 21 U.S.C. section 360bbb-3(b)(1), unless the authorization is terminated or revoked.  Performed at [facility], [place] [address], [place], [place] [address]      Time coordinating discharge: Over 30 minutes  SIGNED:   Phillips Climes, MD  [facility] 01/08/2021, 1:10 PM Pager   If 7PM-7AM, please contact night-coverage [email or web address] Password [email or web address]

## 2021-01-08 NOTE — Discharge Instructions (Signed)
Follow with Primary MD /ALF physician in 3 days  Get CBC, CMP, 2 view Chest X ray checked  by Primary MD next visit.    Activity: As tolerated with Full fall precautions use walker/cane & assistance as needed   Disposition ALF   Diet: Heart Healthy , with feeding assistance and aspiration precautions.  For Heart failure patients - Check your Weight same time everyday, if you gain over 2 pounds, or you develop in leg swelling, experience more shortness of breath or chest pain, call your Primary MD immediately. Follow Cardiac Low Salt Diet and 1.5 lit/day fluid restriction.   On your next visit with your primary care physician please Get Medicines reviewed and adjusted.   Please request your Prim.MD to go over all Hospital Tests and Procedure/Radiological results at the follow up, please get all Hospital records sent to your Prim MD by signing hospital release before you go home.   If you experience worsening of your admission symptoms, develop shortness of breath, life threatening emergency, suicidal or homicidal thoughts you must seek medical attention immediately by calling 911 or calling your MD immediately  if symptoms less severe.  You Must read complete instructions/literature along with all the possible adverse reactions/side effects for all the Medicines you take and that have been prescribed to you. Take any new Medicines after you have completely understood and accpet all the possible adverse reactions/side effects.   Do not drive, operating heavy machinery, perform activities at heights, swimming or participation in water activities or provide baby sitting services if your were admitted for syncope or siezures until you have seen by Primary MD or a Neurologist and advised to do so again.  Do not drive when taking Pain medications.    Do not take more than prescribed Pain, Sleep and Anxiety Medications  Special Instructions: If you have smoked or chewed Tobacco  in the last  2 yrs please stop smoking, stop any regular Alcohol  and or any Recreational drug use.  Wear Seat belts while driving.   Please note  You were cared for by a hospitalist during your hospital stay. If you have any questions about your discharge medications or the care you received while you were in the hospital after you are discharged, you can call the unit and asked to speak with the hospitalist on call if the hospitalist that took care of you is not available. Once you are discharged, your primary care physician will handle any further medical issues. Please note that NO REFILLS for any discharge medications will be authorized once you are discharged, as it is imperative that you return to your primary care physician (or establish a relationship with a primary care physician if you do not have one) for your aftercare needs so that they can reassess your need for medications and monitor your lab values.

## 2021-01-09 ENCOUNTER — Encounter: Payer: Self-pay | Admitting: Orthopedic Surgery

## 2021-01-09 ENCOUNTER — Encounter: Payer: Self-pay | Admitting: Nurse Practitioner

## 2021-01-09 ENCOUNTER — Non-Acute Institutional Stay: Payer: Medicare Other | Admitting: Nurse Practitioner

## 2021-01-09 DIAGNOSIS — R2681 Unsteadiness on feet: Secondary | ICD-10-CM | POA: Diagnosis not present

## 2021-01-09 DIAGNOSIS — R2689 Other abnormalities of gait and mobility: Secondary | ICD-10-CM | POA: Diagnosis not present

## 2021-01-09 DIAGNOSIS — K5901 Slow transit constipation: Secondary | ICD-10-CM | POA: Diagnosis not present

## 2021-01-09 DIAGNOSIS — R601 Generalized edema: Secondary | ICD-10-CM | POA: Diagnosis not present

## 2021-01-09 DIAGNOSIS — R41841 Cognitive communication deficit: Secondary | ICD-10-CM | POA: Diagnosis not present

## 2021-01-09 DIAGNOSIS — K219 Gastro-esophageal reflux disease without esophagitis: Secondary | ICD-10-CM | POA: Diagnosis not present

## 2021-01-09 DIAGNOSIS — R319 Hematuria, unspecified: Secondary | ICD-10-CM | POA: Diagnosis not present

## 2021-01-09 DIAGNOSIS — I5032 Chronic diastolic (congestive) heart failure: Secondary | ICD-10-CM | POA: Diagnosis not present

## 2021-01-09 DIAGNOSIS — E8779 Other fluid overload: Secondary | ICD-10-CM | POA: Diagnosis not present

## 2021-01-09 DIAGNOSIS — E871 Hypo-osmolality and hyponatremia: Secondary | ICD-10-CM | POA: Insufficient documentation

## 2021-01-09 DIAGNOSIS — D638 Anemia in other chronic diseases classified elsewhere: Secondary | ICD-10-CM | POA: Diagnosis not present

## 2021-01-09 DIAGNOSIS — M6281 Muscle weakness (generalized): Secondary | ICD-10-CM | POA: Diagnosis not present

## 2021-01-09 DIAGNOSIS — N1832 Chronic kidney disease, stage 3b: Secondary | ICD-10-CM | POA: Diagnosis not present

## 2021-01-09 DIAGNOSIS — E039 Hypothyroidism, unspecified: Secondary | ICD-10-CM | POA: Diagnosis not present

## 2021-01-09 DIAGNOSIS — I5021 Acute systolic (congestive) heart failure: Secondary | ICD-10-CM | POA: Diagnosis not present

## 2021-01-09 DIAGNOSIS — I4821 Permanent atrial fibrillation: Secondary | ICD-10-CM | POA: Diagnosis not present

## 2021-01-09 DIAGNOSIS — R1311 Dysphagia, oral phase: Secondary | ICD-10-CM | POA: Diagnosis not present

## 2021-01-09 DIAGNOSIS — F411 Generalized anxiety disorder: Secondary | ICD-10-CM

## 2021-01-09 DIAGNOSIS — I4891 Unspecified atrial fibrillation: Secondary | ICD-10-CM

## 2021-01-09 DIAGNOSIS — E785 Hyperlipidemia, unspecified: Secondary | ICD-10-CM | POA: Diagnosis not present

## 2021-01-09 DIAGNOSIS — N184 Chronic kidney disease, stage 4 (severe): Secondary | ICD-10-CM

## 2021-01-09 NOTE — Assessment & Plan Note (Signed)
Prn Senokot is available to him

## 2021-01-09 NOTE — Assessment & Plan Note (Signed)
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

## 2021-01-09 NOTE — Assessment & Plan Note (Signed)
Continue Simvastatin. 

## 2021-01-09 NOTE — Assessment & Plan Note (Signed)
Heart rate is in control, on Coumadin, Metoprolol, update PT/INR

## 2021-01-09 NOTE — Assessment & Plan Note (Signed)
Levothyroxine was increased to 143mcg qd in hospital, TSH 5.933, Free T4 1.11 in hospital 01/03/21

## 2021-01-09 NOTE — Assessment & Plan Note (Signed)
stable, takes Omeprazole

## 2021-01-09 NOTE — Assessment & Plan Note (Signed)
Bun/creat 48/1.91, eGFR 31

## 2021-01-09 NOTE — Assessment & Plan Note (Signed)
Na 133 01/08/21, pending BMP
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
## 2021-01-09 NOTE — Assessment & Plan Note (Signed)
up Furosemide to 80mg  qd, f/u BMP, wean off O2, bibasilar rales, DOE, needs watching weight. LVEF 40-45%. F/u Cardiology. Not on ACEI/ARB/ARNI 2nd to CKD.  Weight daily ac breakfast x3 then 2x/wk.

## 2021-01-09 NOTE — Assessment & Plan Note (Signed)
takes Sertraline, prn Diazepam

## 2021-01-13 ENCOUNTER — Non-Acute Institutional Stay: Payer: Medicare Other | Admitting: Orthopedic Surgery

## 2021-01-13 ENCOUNTER — Encounter: Payer: Self-pay | Admitting: Orthopedic Surgery

## 2021-01-13 DIAGNOSIS — I4821 Permanent atrial fibrillation: Secondary | ICD-10-CM | POA: Diagnosis not present

## 2021-01-13 DIAGNOSIS — R1311 Dysphagia, oral phase: Secondary | ICD-10-CM | POA: Diagnosis not present

## 2021-01-13 DIAGNOSIS — M6281 Muscle weakness (generalized): Secondary | ICD-10-CM | POA: Diagnosis not present

## 2021-01-13 DIAGNOSIS — R2689 Other abnormalities of gait and mobility: Secondary | ICD-10-CM | POA: Diagnosis not present

## 2021-01-13 DIAGNOSIS — R2681 Unsteadiness on feet: Secondary | ICD-10-CM | POA: Diagnosis not present

## 2021-01-13 DIAGNOSIS — N4889 Other specified disorders of penis: Secondary | ICD-10-CM

## 2021-01-13 DIAGNOSIS — R41841 Cognitive communication deficit: Secondary | ICD-10-CM | POA: Diagnosis not present
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
## 2021-01-14 DIAGNOSIS — R41841 Cognitive communication deficit: Secondary | ICD-10-CM | POA: Diagnosis not present

## 2021-01-14 DIAGNOSIS — M6281 Muscle weakness (generalized): Secondary | ICD-10-CM | POA: Diagnosis not present

## 2021-01-14 DIAGNOSIS — R2689 Other abnormalities of gait and mobility: Secondary | ICD-10-CM | POA: Diagnosis not present

## 2021-01-14 DIAGNOSIS — R1311 Dysphagia, oral phase: Secondary | ICD-10-CM | POA: Diagnosis not present

## 2021-01-14 DIAGNOSIS — I4821 Permanent atrial fibrillation: Secondary | ICD-10-CM | POA: Diagnosis not present

## 2021-01-14 DIAGNOSIS — R2681 Unsteadiness on feet: Secondary | ICD-10-CM | POA: Diagnosis not present

## 2021-01-15 DIAGNOSIS — R1311 Dysphagia, oral phase: Secondary | ICD-10-CM | POA: Diagnosis not present

## 2021-01-15 DIAGNOSIS — Z23 Encounter for immunization: Secondary | ICD-10-CM | POA: Diagnosis not present

## 2021-01-15 DIAGNOSIS — I4821 Permanent atrial fibrillation: Secondary | ICD-10-CM | POA: Diagnosis not present

## 2021-01-15 DIAGNOSIS — M6281 Muscle weakness (generalized): Secondary | ICD-10-CM | POA: Diagnosis not present

## 2021-01-15 DIAGNOSIS — R2689 Other abnormalities of gait and mobility: Secondary | ICD-10-CM | POA: Diagnosis not present

## 2021-01-15 DIAGNOSIS — R41841 Cognitive communication deficit: Secondary | ICD-10-CM | POA: Diagnosis not present

## 2021-01-15 DIAGNOSIS — R2681 Unsteadiness on feet: Secondary | ICD-10-CM | POA: Diagnosis not present

## 2021-01-16 DIAGNOSIS — R1311 Dysphagia, oral phase: Secondary | ICD-10-CM | POA: Diagnosis not present

## 2021-01-16 DIAGNOSIS — R41841 Cognitive communication deficit: Secondary | ICD-10-CM | POA: Diagnosis not present

## 2021-01-16 DIAGNOSIS — R2681 Unsteadiness on feet: Secondary | ICD-10-CM | POA: Diagnosis not present

## 2021-01-16 DIAGNOSIS — I4821 Permanent atrial fibrillation: Secondary | ICD-10-CM | POA: Diagnosis not present

## 2021-01-16 DIAGNOSIS — M6281 Muscle weakness (generalized): Secondary | ICD-10-CM | POA: Diagnosis not present

## 2021-01-16 DIAGNOSIS — R2689 Other abnormalities of gait and mobility: Secondary | ICD-10-CM | POA: Diagnosis not present

## 2021-01-17 DIAGNOSIS — M6281 Muscle weakness (generalized): Secondary | ICD-10-CM | POA: Diagnosis not present

## 2021-01-17 DIAGNOSIS — R1311 Dysphagia, oral phase: Secondary | ICD-10-CM | POA: Diagnosis not present

## 2021-01-17 DIAGNOSIS — R2689 Other abnormalities of gait and mobility: Secondary | ICD-10-CM | POA: Diagnosis not present

## 2021-01-17 DIAGNOSIS — R41841 Cognitive communication deficit: Secondary | ICD-10-CM | POA: Diagnosis not present

## 2021-01-17 DIAGNOSIS — I4821 Permanent atrial fibrillation: Secondary | ICD-10-CM | POA: Diagnosis not present

## 2021-01-17 DIAGNOSIS — R2681 Unsteadiness on feet: Secondary | ICD-10-CM | POA: Diagnosis not present

## 2021-01-20 ENCOUNTER — Non-Acute Institutional Stay: Payer: Medicare Other | Admitting: Orthopedic Surgery

## 2021-01-20 ENCOUNTER — Encounter: Payer: Self-pay | Admitting: Orthopedic Surgery

## 2021-01-20 DIAGNOSIS — N481 Balanitis: Secondary | ICD-10-CM

## 2021-01-20 DIAGNOSIS — I4821 Permanent atrial fibrillation: Secondary | ICD-10-CM | POA: Diagnosis not present

## 2021-01-20 DIAGNOSIS — R2681 Unsteadiness on feet: Secondary | ICD-10-CM | POA: Diagnosis not present

## 2021-01-20 DIAGNOSIS — R2689 Other abnormalities of gait and mobility: Secondary | ICD-10-CM | POA: Diagnosis not present

## 2021-01-20 DIAGNOSIS — R1311 Dysphagia, oral phase: Secondary | ICD-10-CM | POA: Diagnosis not present

## 2021-01-20 DIAGNOSIS — M6281 Muscle weakness (generalized): Secondary | ICD-10-CM | POA: Diagnosis not present

## 2021-01-20 DIAGNOSIS — R41841 Cognitive communication deficit: Secondary | ICD-10-CM | POA: Diagnosis not present
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
## 2021-01-22 DIAGNOSIS — R41841 Cognitive communication deficit: Secondary | ICD-10-CM | POA: Diagnosis not present

## 2021-01-22 DIAGNOSIS — M6281 Muscle weakness (generalized): Secondary | ICD-10-CM | POA: Diagnosis not present

## 2021-01-22 DIAGNOSIS — R1311 Dysphagia, oral phase: Secondary | ICD-10-CM | POA: Diagnosis not present

## 2021-01-22 DIAGNOSIS — R2681 Unsteadiness on feet: Secondary | ICD-10-CM | POA: Diagnosis not present

## 2021-01-22 DIAGNOSIS — I4821 Permanent atrial fibrillation: Secondary | ICD-10-CM | POA: Diagnosis not present

## 2021-01-22 DIAGNOSIS — R2689 Other abnormalities of gait and mobility: Secondary | ICD-10-CM | POA: Diagnosis not present

## 2021-01-23 DIAGNOSIS — I4821 Permanent atrial fibrillation: Secondary | ICD-10-CM | POA: Diagnosis not present

## 2021-01-23 DIAGNOSIS — R41841 Cognitive communication deficit: Secondary | ICD-10-CM | POA: Diagnosis not present

## 2021-01-23 DIAGNOSIS — M6281 Muscle weakness (generalized): Secondary | ICD-10-CM | POA: Diagnosis not present

## 2021-01-23 DIAGNOSIS — R2689 Other abnormalities of gait and mobility: Secondary | ICD-10-CM | POA: Diagnosis not present

## 2021-01-23 DIAGNOSIS — R1311 Dysphagia, oral phase: Secondary | ICD-10-CM | POA: Diagnosis not present

## 2021-01-23 DIAGNOSIS — R2681 Unsteadiness on feet: Secondary | ICD-10-CM | POA: Diagnosis not present

## 2021-01-24 DIAGNOSIS — R41841 Cognitive communication deficit: Secondary | ICD-10-CM | POA: Diagnosis not present

## 2021-01-24 DIAGNOSIS — R1311 Dysphagia, oral phase: Secondary | ICD-10-CM | POA: Diagnosis not present

## 2021-01-24 DIAGNOSIS — M6281 Muscle weakness (generalized): Secondary | ICD-10-CM | POA: Diagnosis not present

## 2021-01-24 DIAGNOSIS — I4821 Permanent atrial fibrillation: Secondary | ICD-10-CM | POA: Diagnosis not present

## 2021-01-24 DIAGNOSIS — R2689 Other abnormalities of gait and mobility: Secondary | ICD-10-CM | POA: Diagnosis not present

## 2021-01-24 DIAGNOSIS — R2681 Unsteadiness on feet: Secondary | ICD-10-CM | POA: Diagnosis not present

## 2021-01-27 ENCOUNTER — Encounter (HOSPITAL_COMMUNITY): Payer: Self-pay

## 2021-01-27 ENCOUNTER — Non-Acute Institutional Stay: Payer: Medicare Other | Admitting: Orthopedic Surgery

## 2021-01-27 ENCOUNTER — Inpatient Hospital Stay (HOSPITAL_COMMUNITY)
Admission: EM | Admit: 2021-01-27 | Discharge: 2021-02-01 | DRG: 291 | Disposition: A | Discharge status: Nursing Home (Includes ICF) | Payer: Medicare Other | Attending: Internal Medicine | Admitting: Internal Medicine

## 2021-01-27 ENCOUNTER — Encounter: Payer: Self-pay | Admitting: Orthopedic Surgery

## 2021-01-27 ENCOUNTER — Emergency Department (HOSPITAL_COMMUNITY): Payer: Medicare Other

## 2021-01-27 ENCOUNTER — Telehealth: Payer: Self-pay | Admitting: Adult Health

## 2021-01-27 DIAGNOSIS — F32A Depression, unspecified: Secondary | ICD-10-CM | POA: Diagnosis present

## 2021-01-27 DIAGNOSIS — D631 Anemia in chronic kidney disease: Secondary | ICD-10-CM | POA: Diagnosis present

## 2021-01-27 DIAGNOSIS — I248 Other forms of acute ischemic heart disease: Secondary | ICD-10-CM | POA: Diagnosis present

## 2021-01-27 DIAGNOSIS — N179 Acute kidney failure, unspecified: Secondary | ICD-10-CM | POA: Diagnosis present

## 2021-01-27 DIAGNOSIS — I4821 Permanent atrial fibrillation: Secondary | ICD-10-CM | POA: Diagnosis present

## 2021-01-27 DIAGNOSIS — N3289 Other specified disorders of bladder: Secondary | ICD-10-CM | POA: Diagnosis not present

## 2021-01-27 DIAGNOSIS — R609 Edema, unspecified: Secondary | ICD-10-CM

## 2021-01-27 DIAGNOSIS — I130 Hypertensive heart and chronic kidney disease with heart failure and stage 1 through stage 4 chronic kidney disease, or unspecified chronic kidney disease: Principal | ICD-10-CM | POA: Diagnosis present

## 2021-01-27 DIAGNOSIS — J302 Other seasonal allergic rhinitis: Secondary | ICD-10-CM | POA: Diagnosis present

## 2021-01-27 DIAGNOSIS — E871 Hypo-osmolality and hyponatremia: Secondary | ICD-10-CM | POA: Diagnosis present

## 2021-01-27 DIAGNOSIS — R269 Unspecified abnormalities of gait and mobility: Secondary | ICD-10-CM | POA: Diagnosis present

## 2021-01-27 DIAGNOSIS — I509 Heart failure, unspecified: Secondary | ICD-10-CM | POA: Diagnosis not present

## 2021-01-27 DIAGNOSIS — E785 Hyperlipidemia, unspecified: Secondary | ICD-10-CM | POA: Diagnosis present

## 2021-01-27 DIAGNOSIS — I071 Rheumatic tricuspid insufficiency: Secondary | ICD-10-CM | POA: Diagnosis present

## 2021-01-27 DIAGNOSIS — K219 Gastro-esophageal reflux disease without esophagitis: Secondary | ICD-10-CM | POA: Diagnosis present

## 2021-01-27 DIAGNOSIS — Z951 Presence of aortocoronary bypass graft: Secondary | ICD-10-CM

## 2021-01-27 DIAGNOSIS — I4891 Unspecified atrial fibrillation: Secondary | ICD-10-CM | POA: Diagnosis not present

## 2021-01-27 DIAGNOSIS — F411 Generalized anxiety disorder: Secondary | ICD-10-CM | POA: Diagnosis not present

## 2021-01-27 DIAGNOSIS — T502X5A Adverse effect of carbonic-anhydrase inhibitors, benzothiadiazides and other diuretics, initial encounter: Secondary | ICD-10-CM | POA: Diagnosis present

## 2021-01-27 DIAGNOSIS — I110 Hypertensive heart disease with heart failure: Secondary | ICD-10-CM | POA: Diagnosis not present

## 2021-01-27 DIAGNOSIS — R339 Retention of urine, unspecified: Secondary | ICD-10-CM | POA: Diagnosis not present

## 2021-01-27 DIAGNOSIS — Z79899 Other long term (current) drug therapy: Secondary | ICD-10-CM

## 2021-01-27 DIAGNOSIS — R2681 Unsteadiness on feet: Secondary | ICD-10-CM | POA: Diagnosis not present

## 2021-01-27 DIAGNOSIS — Z66 Do not resuscitate: Secondary | ICD-10-CM | POA: Diagnosis present

## 2021-01-27 DIAGNOSIS — R600 Localized edema: Secondary | ICD-10-CM | POA: Diagnosis not present

## 2021-01-27 DIAGNOSIS — Z79890 Hormone replacement therapy (postmenopausal): Secondary | ICD-10-CM

## 2021-01-27 DIAGNOSIS — E8770 Fluid overload, unspecified: Secondary | ICD-10-CM | POA: Diagnosis not present

## 2021-01-27 DIAGNOSIS — J8410 Pulmonary fibrosis, unspecified: Secondary | ICD-10-CM | POA: Diagnosis present

## 2021-01-27 DIAGNOSIS — Z9181 History of falling: Secondary | ICD-10-CM

## 2021-01-27 DIAGNOSIS — N1832 Chronic kidney disease, stage 3b: Secondary | ICD-10-CM | POA: Diagnosis present

## 2021-01-27 DIAGNOSIS — N281 Cyst of kidney, acquired: Secondary | ICD-10-CM | POA: Diagnosis not present

## 2021-01-27 DIAGNOSIS — I5023 Acute on chronic systolic (congestive) heart failure: Secondary | ICD-10-CM | POA: Diagnosis present

## 2021-01-27 DIAGNOSIS — R310 Gross hematuria: Secondary | ICD-10-CM | POA: Diagnosis present

## 2021-01-27 DIAGNOSIS — E8779 Other fluid overload: Secondary | ICD-10-CM | POA: Diagnosis not present

## 2021-01-27 DIAGNOSIS — I2510 Atherosclerotic heart disease of native coronary artery without angina pectoris: Secondary | ICD-10-CM | POA: Diagnosis present

## 2021-01-27 DIAGNOSIS — R1311 Dysphagia, oral phase: Secondary | ICD-10-CM | POA: Diagnosis not present

## 2021-01-27 DIAGNOSIS — R053 Chronic cough: Secondary | ICD-10-CM | POA: Diagnosis present

## 2021-01-27 DIAGNOSIS — F419 Anxiety disorder, unspecified: Secondary | ICD-10-CM | POA: Diagnosis present

## 2021-01-27 DIAGNOSIS — I5032 Chronic diastolic (congestive) heart failure: Secondary | ICD-10-CM

## 2021-01-27 DIAGNOSIS — M7989 Other specified soft tissue disorders: Secondary | ICD-10-CM | POA: Diagnosis not present

## 2021-01-27 DIAGNOSIS — N184 Chronic kidney disease, stage 4 (severe): Secondary | ICD-10-CM | POA: Diagnosis not present

## 2021-01-27 DIAGNOSIS — N4889 Other specified disorders of penis: Secondary | ICD-10-CM | POA: Diagnosis present

## 2021-01-27 DIAGNOSIS — I739 Peripheral vascular disease, unspecified: Secondary | ICD-10-CM | POA: Diagnosis present

## 2021-01-27 DIAGNOSIS — K5900 Constipation, unspecified: Secondary | ICD-10-CM | POA: Diagnosis not present

## 2021-01-27 DIAGNOSIS — Z7901 Long term (current) use of anticoagulants: Secondary | ICD-10-CM

## 2021-01-27 DIAGNOSIS — I252 Old myocardial infarction: Secondary | ICD-10-CM

## 2021-01-27 DIAGNOSIS — E7800 Pure hypercholesterolemia, unspecified: Secondary | ICD-10-CM | POA: Diagnosis present

## 2021-01-27 DIAGNOSIS — D638 Anemia in other chronic diseases classified elsewhere: Secondary | ICD-10-CM

## 2021-01-27 DIAGNOSIS — N481 Balanitis: Secondary | ICD-10-CM

## 2021-01-27 DIAGNOSIS — Z888 Allergy status to other drugs, medicaments and biological substances status: Secondary | ICD-10-CM

## 2021-01-27 DIAGNOSIS — Z20822 Contact with and (suspected) exposure to covid-19: Secondary | ICD-10-CM | POA: Diagnosis present

## 2021-01-27 DIAGNOSIS — E039 Hypothyroidism, unspecified: Secondary | ICD-10-CM | POA: Diagnosis present

## 2021-01-27 DIAGNOSIS — R2689 Other abnormalities of gait and mobility: Secondary | ICD-10-CM | POA: Diagnosis not present

## 2021-01-27 DIAGNOSIS — Z880 Allergy status to penicillin: Secondary | ICD-10-CM

## 2021-01-27 DIAGNOSIS — Z881 Allergy status to other antibiotic agents status: Secondary | ICD-10-CM

## 2021-01-27 DIAGNOSIS — R011 Cardiac murmur, unspecified: Secondary | ICD-10-CM | POA: Diagnosis present

## 2021-01-27 DIAGNOSIS — I517 Cardiomegaly: Secondary | ICD-10-CM | POA: Diagnosis not present

## 2021-01-27 DIAGNOSIS — M6281 Muscle weakness (generalized): Secondary | ICD-10-CM | POA: Diagnosis not present

## 2021-01-27 DIAGNOSIS — N368 Other specified disorders of urethra: Secondary | ICD-10-CM | POA: Diagnosis present

## 2021-01-27 DIAGNOSIS — R0602 Shortness of breath: Secondary | ICD-10-CM | POA: Diagnosis not present

## 2021-01-27 DIAGNOSIS — R41841 Cognitive communication deficit: Secondary | ICD-10-CM | POA: Diagnosis not present

## 2021-01-27 LAB — URINALYSIS, ROUTINE W REFLEX MICROSCOPIC
Bilirubin Urine: NEGATIVE
Glucose, UA: NEGATIVE mg/dL
Hgb urine dipstick: NEGATIVE
Ketones, ur: NEGATIVE mg/dL
Leukocytes,Ua: NEGATIVE
Nitrite: NEGATIVE
Protein, ur: NEGATIVE mg/dL
Specific Gravity, Urine: 1.011 (ref 1.005–1.030)
pH: 5 (ref 5.0–8.0)

## 2021-01-27 LAB — CBC
HCT: 36.7 % — ABNORMAL LOW (ref 39.0–52.0)
Hemoglobin: 11.7 g/dL — ABNORMAL LOW (ref 13.0–17.0)
MCH: 30.2 pg (ref 26.0–34.0)
MCHC: 31.9 g/dL (ref 30.0–36.0)
MCV: 94.8 fL (ref 80.0–100.0)
Platelets: 168 10*3/uL (ref 150–400)
RBC: 3.87 MIL/uL — ABNORMAL LOW (ref 4.22–5.81)
RDW: 15.6 % — ABNORMAL HIGH (ref 11.5–15.5)
WBC: 8.3 10*3/uL (ref 4.0–10.5)
nRBC: 0 % (ref 0.0–0.2)

## 2021-01-27 LAB — COMPREHENSIVE METABOLIC PANEL
ALT: 14 U/L (ref 0–44)
AST: 24 U/L (ref 15–41)
Albumin: 3.4 g/dL — ABNORMAL LOW (ref 3.5–5.0)
Alkaline Phosphatase: 159 U/L — ABNORMAL HIGH (ref 38–126)
Anion gap: 10 (ref 5–15)
BUN: 50 mg/dL — ABNORMAL HIGH (ref 8–23)
CO2: 24 mmol/L (ref 22–32)
Calcium: 8.7 mg/dL — ABNORMAL LOW (ref 8.9–10.3)
Chloride: 94 mmol/L — ABNORMAL LOW (ref 98–111)
Creatinine, Ser: 2.15 mg/dL — ABNORMAL HIGH (ref 0.61–1.24)
GFR, Estimated: 27 mL/min — ABNORMAL LOW (ref >60)
Glucose, Bld: 124 mg/dL — ABNORMAL HIGH (ref 70–99)
Potassium: 4.3 mmol/L (ref 3.5–5.1)
Sodium: 128 mmol/L — ABNORMAL LOW (ref 135–145)
Total Bilirubin: 0.8 mg/dL (ref 0.3–1.2)
Total Protein: 6.7 g/dL (ref 6.5–8.1)

## 2021-01-27 LAB — PROTIME-INR
INR: 2.3 — ABNORMAL HIGH (ref 0.8–1.2)
Prothrombin Time: 25.1 seconds — ABNORMAL HIGH (ref 11.4–15.2)

## 2021-01-27 LAB — MAGNESIUM: Magnesium: 2.9 mg/dL — ABNORMAL HIGH (ref 1.7–2.4)

## 2021-01-27 LAB — BRAIN NATRIURETIC PEPTIDE: B Natriuretic Peptide: 1003.6 pg/mL — ABNORMAL HIGH (ref 0.0–100.0)

## 2021-01-27 LAB — TROPONIN I (HIGH SENSITIVITY): Troponin I (High Sensitivity): 134 ng/L (ref <18)

## 2021-01-27 MED ORDER — FUROSEMIDE 10 MG/ML IJ SOLN
80.0000 mg | Freq: Once | INTRAMUSCULAR | Status: DC
  Filled 2021-01-27: qty 8

## 2021-01-27 MED ORDER — ASPIRIN EC 325 MG PO TBEC
325.0000 mg | DELAYED_RELEASE_TABLET | Freq: Once | ORAL | Status: AC
  Administered 2021-01-28: 325 mg via ORAL
  Filled 2021-01-27: qty 1

## 2021-01-27 MED ORDER — ENOXAPARIN SODIUM 30 MG/0.3ML IJ SOSY: 30.0000 mg | PREFILLED_SYRINGE | INTRAMUSCULAR | Status: DC

## 2021-01-27 MED ORDER — FUROSEMIDE 40 MG PO TABS: 40.0000 mg | ORAL_TABLET | ORAL | 3 refills | Status: DC
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
## 2021-01-27 NOTE — Telephone Encounter (Signed)
Nurse from [facility] called to report that this resident has significant penile swelling. The penis is at least 6 times the normal size by her account but the scrotal area is within normal limits. He is not able to void and has been given increased lasix due to edema. The nurse states she is not able to catheterize him due to swelling and is requesting an order to send him to the ER for further evaluation. Order given.

## 2021-01-27 NOTE — ED Triage Notes (Signed)
Pt comes via [facility] from [facility], pt has increased lasix today from 40mg  to 80mg ,, pt is having penile swelling and leg swelling. Rhonchi in all lobes, denies SOB
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
## 2021-01-28 ENCOUNTER — Other Ambulatory Visit: Payer: Self-pay

## 2021-01-28 ENCOUNTER — Encounter (HOSPITAL_COMMUNITY): Payer: Self-pay | Admitting: Internal Medicine

## 2021-01-28 DIAGNOSIS — I5023 Acute on chronic systolic (congestive) heart failure: Secondary | ICD-10-CM

## 2021-01-28 LAB — RENAL FUNCTION PANEL
Albumin: 3.3 g/dL — ABNORMAL LOW (ref 3.5–5.0)
Anion gap: 12 (ref 5–15)
BUN: 50 mg/dL — ABNORMAL HIGH (ref 8–23)
CO2: 28 mmol/L (ref 22–32)
Calcium: 8.8 mg/dL — ABNORMAL LOW (ref 8.9–10.3)
Chloride: 91 mmol/L — ABNORMAL LOW (ref 98–111)
Creatinine, Ser: 2.07 mg/dL — ABNORMAL HIGH (ref 0.61–1.24)
GFR, Estimated: 28 mL/min — ABNORMAL LOW (ref >60)
Glucose, Bld: 110 mg/dL — ABNORMAL HIGH (ref 70–99)
Phosphorus: 3.6 mg/dL (ref 2.5–4.6)
Potassium: 3.9 mmol/L (ref 3.5–5.1)
Sodium: 131 mmol/L — ABNORMAL LOW (ref 135–145)

## 2021-01-28 LAB — TROPONIN I (HIGH SENSITIVITY)
Troponin I (High Sensitivity): 125 ng/L (ref <18)
Troponin I (High Sensitivity): 141 ng/L (ref <18)

## 2021-01-28 LAB — CBC
HCT: 34.8 % — ABNORMAL LOW (ref 39.0–52.0)
Hemoglobin: 11.1 g/dL — ABNORMAL LOW (ref 13.0–17.0)
MCH: 30.3 pg (ref 26.0–34.0)
MCHC: 31.9 g/dL (ref 30.0–36.0)
MCV: 95.1 fL (ref 80.0–100.0)
Platelets: 175 10*3/uL (ref 150–400)
RBC: 3.66 MIL/uL — ABNORMAL LOW (ref 4.22–5.81)
RDW: 15.5 % (ref 11.5–15.5)
WBC: 8.9 10*3/uL (ref 4.0–10.5)
nRBC: 0 % (ref 0.0–0.2)

## 2021-01-28 LAB — PROTIME-INR
INR: 2.4 — ABNORMAL HIGH (ref 0.8–1.2)
Prothrombin Time: 26.3 seconds — ABNORMAL HIGH (ref 11.4–15.2)

## 2021-01-28 LAB — MAGNESIUM: Magnesium: 2.6 mg/dL — ABNORMAL HIGH (ref 1.7–2.4)

## 2021-01-28 MED ORDER — ACETAMINOPHEN 325 MG PO TABS: 650.0000 mg | ORAL_TABLET | Freq: Four times a day (QID) | ORAL | Status: DC | PRN

## 2021-01-28 MED ORDER — WARFARIN SODIUM 1 MG PO TABS
1.5000 mg | ORAL_TABLET | ORAL | Status: DC
  Administered 2021-01-28 – 2021-02-01 (×3): 1.5 mg via ORAL
  Filled 2021-01-28 (×3): qty 1

## 2021-01-28 MED ORDER — POLYETHYLENE GLYCOL 3350 17 G PO PACK
17.0000 g | PACK | Freq: Every day | ORAL | Status: DC | PRN
  Administered 2021-01-29 – 2021-01-31 (×3): 17 g via ORAL
  Filled 2021-01-28 (×3): qty 1

## 2021-01-28 MED ORDER — FUROSEMIDE 10 MG/ML IJ SOLN
20.0000 mg | Freq: Four times a day (QID) | INTRAMUSCULAR | Status: DC
  Administered 2021-01-28: 20 mg via INTRAVENOUS
  Filled 2021-01-28: qty 2

## 2021-01-28 MED ORDER — WARFARIN - PHARMACIST DOSING INPATIENT: Freq: Every day | Status: DC

## 2021-01-28 MED ORDER — LEVOTHYROXINE SODIUM 88 MCG PO TABS
88.0000 ug | ORAL_TABLET | Freq: Every day | ORAL | Status: DC
  Administered 2021-01-28 – 2021-01-29 (×2): 88 ug via ORAL
  Filled 2021-01-28 (×2): qty 1

## 2021-01-28 MED ORDER — VITAMIN D 25 MCG (1000 UNIT) PO TABS
1000.0000 [IU] | ORAL_TABLET | Freq: Every day | ORAL | Status: DC
  Administered 2021-01-28 – 2021-02-01 (×5): 1000 [IU] via ORAL
  Filled 2021-01-28 (×5): qty 1

## 2021-01-28 MED ORDER — WARFARIN SODIUM 1 MG PO TABS
1.0000 mg | ORAL_TABLET | ORAL | Status: DC
  Administered 2021-01-29 – 2021-01-31 (×2): 1 mg via ORAL
  Filled 2021-01-28 (×2): qty 1

## 2021-01-28 MED ORDER — FUROSEMIDE 10 MG/ML IJ SOLN: 20.0000 mg | Freq: Four times a day (QID) | INTRAMUSCULAR | Status: DC

## 2021-01-28 MED ORDER — FUROSEMIDE 10 MG/ML IJ SOLN
40.0000 mg | Freq: Two times a day (BID) | INTRAMUSCULAR | Status: DC
  Administered 2021-01-28 – 2021-01-29 (×4): 40 mg via INTRAVENOUS
  Filled 2021-01-28 (×4): qty 4

## 2021-01-28 MED ORDER — FUROSEMIDE 10 MG/ML IJ SOLN
80.0000 mg | Freq: Once | INTRAMUSCULAR | Status: AC
  Administered 2021-01-28: 80 mg via INTRAVENOUS

## 2021-01-28 MED ORDER — MELATONIN 3 MG PO TABS: 3.0000 mg | ORAL_TABLET | Freq: Every evening | ORAL | Status: DC | PRN

## 2021-01-28 MED ORDER — ONDANSETRON HCL 4 MG/2ML IJ SOLN: 4.0000 mg | Freq: Four times a day (QID) | INTRAMUSCULAR | Status: DC | PRN

## 2021-01-28 MED ORDER — SIMVASTATIN 20 MG PO TABS
10.0000 mg | ORAL_TABLET | Freq: Every day | ORAL | Status: DC
  Administered 2021-01-28 – 2021-02-01 (×5): 10 mg via ORAL
  Filled 2021-01-28 (×5): qty 1

## 2021-01-28 MED ORDER — PANTOPRAZOLE SODIUM 40 MG PO TBEC
40.0000 mg | DELAYED_RELEASE_TABLET | Freq: Every day | ORAL | Status: DC
  Administered 2021-01-28 – 2021-02-01 (×5): 40 mg via ORAL
  Filled 2021-01-28 (×5): qty 1

## 2021-01-28 MED ORDER — SERTRALINE HCL 50 MG PO TABS
25.0000 mg | ORAL_TABLET | Freq: Two times a day (BID) | ORAL | Status: DC
  Administered 2021-01-28 – 2021-02-01 (×10): 25 mg via ORAL
  Filled 2021-01-28 (×11): qty 1

## 2021-01-28 MED ORDER — DIAZEPAM 5 MG PO TABS
2.5000 mg | ORAL_TABLET | Freq: Four times a day (QID) | ORAL | Status: DC | PRN
  Administered 2021-01-30: 2.5 mg via ORAL
  Filled 2021-01-28: qty 1

## 2021-01-28 NOTE — ED Notes (Signed)
Pt bladder scan resulted in 166 ml

## 2021-01-28 NOTE — ED Notes (Signed)
Gave patient his breakfast tray patient is sitting up eating with call bell in reach

## 2021-01-28 NOTE — ED Notes (Signed)
Notified provider pt voided 475 ml. Bladder scanned revealed 167 ml post void.

## 2021-01-28 NOTE — ED Notes (Signed)
Breakfast Orders placed 

## 2021-01-28 NOTE — ED Notes (Signed)
Notified provider pt was bladder scanned. Reveal 368 ml. Verifying placement of foley.

## 2021-01-28 NOTE — ED Notes (Signed)
Pt voided 475 ml of urine.
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
## 2021-01-28 NOTE — ED Notes (Signed)
Provider has put a hold on the foley. She recommends to monitor urine output.

## 2021-01-28 NOTE — ED Notes (Signed)
Bladder scan 1st one was >318 ml and 2nd was 368 ml

## 2021-01-28 NOTE — Progress Notes (Signed)
ANTICOAGULATION CONSULT NOTE - Initial Consult  Pharmacy Consult for Warfarin  Indication: atrial fibrillation  Allergies  Allergen Reactions   Clindamycin/Lincomycin    Penicillins Rash    Vital Signs: BP: 137/90 (06/21 0600) Pulse Rate: 79 (06/21 0600)  Labs: Recent Labs    01/27/21 0055 01/27/21 2218 01/28/21 0237 01/28/21 0436  HGB  --  11.7*  --  11.1*  HCT  --  36.7*  --  34.8*  PLT  --  168  --  175  LABPROT  --  25.1*  --  26.3*  INR  --  2.3*  --  2.4*  CREATININE  --  2.15*  --  2.07*  TROPONINIHS 125* 134* 141*  --     Estimated Creatinine Clearance: 17.2 mL/min (A) (by C-G formula based on SCr of 2.07 mg/dL (H)).  Assessment: 87 y/o M presents to the ED with volume overload, on warfarin PTA for afib, INR is therapeutic at 2.3, Hgb 11.7, Scr 2.15.   Warfarin PTA dosing: 1 mg Mon/Wed/Fri, 1.5 mg Tues/Thurs/Sat/Sun  INR 2.4 today, no s/sx of bleeding  Goal of Therapy:  INR 2-3 Monitor platelets by anticoagulation protocol: Yes   Plan:  Cont warfarin PTA dosing Daily PT/INR Monitor for bleeding  Joetta Manners, PharmD, BCCCP Emergency Medicine Clinical Pharmacist  Please check AMION for all [place] phone numbers After 10:00 PM, call [place]

## 2021-01-28 NOTE — Evaluation (Signed)
Occupational Therapy Evaluation Patient Details Name: [name] MRN: [id number] DOB: 03/09/1920 Today's Date: 01/28/2021    History of Present Illness Pt is a 87 y/o male presenting on 6/20 from [facility] home due to worsening bilateral LE edema. Found in volume overload due to acute on chronic systolic CHF superimposed by worsening renal function. PMH includes: CHF, afib on Coumadin, pulmonary fibrosis, CKD 3B, HTN, MI, PNA, PVD.   Clinical Impression   PTA patient modified independent with ADLs (assist from ALF staff for showers, and using AE for LB dressing), using rollator for mobility. Admitted for above and presenting with decreased activity tolerance, LB edema, and generalized weakness.  Patient currently requires min guard for transfers and in room mobility using RW, up to mod assist for LB ADLs.  Pt on RA with VSS, HR ranged from 80-90. Based on performance today, believe he will benefit from continued OT services while admitted and after dc at [facility] level to optimize independence and safety with ADLs and mobility.      Follow Up Recommendations  Home health OT;Supervision/Assistance - 24 hour (inital 24/7 support)    Equipment Recommendations  3 in 1 bedside commode    Recommendations for Other Services       Precautions / Restrictions Precautions Precautions: Fall Restrictions Weight Bearing Restrictions: No      Mobility Bed Mobility Overal bed mobility: Needs Assistance Bed Mobility: Supine to Sit;Sit to Supine     Supine to sit: Min guard Sit to supine: Min guard   General bed mobility comments: for line mgmt and safety, increased time with HOB elevated    Transfers Overall transfer level: Needs assistance Equipment used: Rolling walker (2 wheeled) Transfers: Sit to/from Stand Sit to Stand: Min guard         General transfer comment: min guard for safety and balance from elevated EOB    Balance Overall balance assessment: Needs  assistance Sitting-balance support: No upper extremity supported;Feet supported Sitting balance-Leahy Scale: Good     Standing balance support: Bilateral upper extremity supported;During functional activity Standing balance-Leahy Scale: Poor Standing balance comment: reliant on UE support                           ADL either performed or assessed with clinical judgement   ADL Overall ADL's : Needs assistance/impaired     Grooming: Set up;Sitting   Upper Body Bathing: Set up;Sitting   Lower Body Bathing: Moderate assistance;Sit to/from stand   Upper Body Dressing : Supervision/safety;Sitting   Lower Body Dressing: Moderate assistance;Sit to/from stand Lower Body Dressing Details (indicate cue type and reason): requires assist for socks, min guard sit to stand Toilet Transfer: Min guard;Ambulation;RW Toilet Transfer Details (indicate cue type and reason): simulated in room Toileting- Clothing Manipulation and Hygiene: Minimal assistance;Sit to/from stand       Functional mobility during ADLs: Min guard;Rolling walker General ADL Comments: pt limited by decreased activity tolerance and LB edema     Vision   Vision Assessment?: No apparent visual deficits     Perception     Praxis      Pertinent Vitals/Pain Pain Assessment: No/denies pain     Hand Dominance Right   Extremity/Trunk Assessment Upper Extremity Assessment Upper Extremity Assessment: Generalized weakness   Lower Extremity Assessment Lower Extremity Assessment: Defer to PT evaluation   Cervical / Trunk Assessment Cervical / Trunk Assessment: Kyphotic   Communication Communication Communication: HOH   Cognition Arousal/Alertness:  Awake/alert Behavior During Therapy: WFL for tasks assessed/performed Overall Cognitive Status: Within Functional Limits for tasks assessed                                 General Comments: age related decreased STM, otherwise WFL   General  Comments  pt on RA with VSS    Exercises     Shoulder Instructions      Home Living Family/patient expects to be discharged to:: Assisted living                             Home Equipment: Walker - 4 wheels;Grab bars - toilet;Shower seat - built in;Hand held shower head;Grab bars - tub/shower;Electric scooter   Additional Comments: Resides at [facility], wife lives across the hall      Prior Functioning/Environment Level of Independence: Needs assistance  Gait / Transfers Assistance Needed: uses rollator for short distances, scooter for longer distances ADL's / Homemaking Assistance Needed: reports independent with ADLs, sometimes wife assist with LB dressing; 2 showers a week with assist from staff; toilets independently; assist with meals and meds from staff   Comments: reports using AE for LB dressing, recent fall (forgetting to lock rollator brakes)        OT Problem List: Decreased strength;Decreased activity tolerance;Impaired balance (sitting and/or standing);Decreased safety awareness;Decreased knowledge of use of DME or AE;Decreased knowledge of precautions;Increased edema      OT Treatment/Interventions: Self-care/ADL training;DME and/or AE instruction;Energy conservation;Therapeutic activities;Patient/family education;Balance training    OT Goals(Current goals can be found in the care plan section) Acute Rehab OT Goals Patient Stated Goal: Return home OT Goal Formulation: With patient Time For Goal Achievement: 02/11/21 Potential to Achieve Goals: Good  OT Frequency: Min 2X/week   Barriers to D/C:            Co-evaluation              AM-PAC OT "6 Clicks" Daily Activity     Outcome Measure Help from another person eating meals?: Total (NPO) Help from another person taking care of personal grooming?: A Little Help from another person toileting, which includes using toliet, bedpan, or urinal?: A Little Help from another person bathing  (including washing, rinsing, drying)?: A Lot Help from another person to put on and taking off regular upper body clothing?: A Little Help from another person to put on and taking off regular lower body clothing?: A Lot 6 Click Score: 14   End of Session Equipment Utilized During Treatment: Rolling walker Nurse Communication: Mobility status  Activity Tolerance: Patient tolerated treatment well Patient left: in bed;with call bell/phone within reach;with bed alarm set;with family/visitor present  OT Visit Diagnosis: Other abnormalities of gait and mobility (R26.89);Muscle weakness (generalized) (M62.81);History of falling (Z91.81)                Time: 9702-6378 OT Time Calculation (min): 23 min Charges:  OT General Charges $OT Visit: 1 Visit OT Evaluation $OT Eval Moderate Complexity: 1 Mod OT Treatments $Self Care/Home Management : 8-22 mins  Jolaine Artist, OT Acute Rehabilitation Services Pager [phone] Office [phone]   Delight Stare 01/28/2021, 1:57 PM

## 2021-01-28 NOTE — ED Notes (Signed)
Pt refusing blood draw viva venipuncture. Pt only wants blood pulled from IV line.

## 2021-01-28 NOTE — Progress Notes (Signed)
PROGRESS NOTE    TRAI ELLS  IOX:[id number] DOB: 05/03/1920 DOA: 01/27/2021 PCP: [name], [name], NP   Brief Narrative:  [name] is a 87 y.o. male with medical history significant for chronic systolic CHF, permanent atrial fibrillation on Coumadin, pulmonary fibrosis, GERD, chronic constipation, hypothyroidism, anemia of chronic disease, hyperlipidemia, CKD 3B, who [facility] to worsening bilateral lower extremity edema up to his groins.  Associated with orthopnea.  Admits to intermittent nonproductive cough which is chronic.  He denies any chest pain, dyspnea or palpitations.  Denies any constitutional symptoms.  Denies excessive use of salt in his diet.  Has been compliant with his medications including diuretics, p.o. Lasix 80 mg daily.   Was brought into the ED for further evaluation.  While in the ED he was found to have 3+ pitting edema all the way up to his perineal region with concern for volume overload.  Received 1 dose of IV Lasix 80 mg x 1.  TRH, hospitalist team, was asked to admit.     Assessment & Plan:   Active Problems:   Volume overload  Anasarca/volume overload secondary to acute systolic heart failure exacerbation Cannot rule out medication or dietary noncompliance  Questionable urinary outlet obstruction versus worsening renal function  - Presented with 13 pound weight gain since discharge on 01/08/2021 - Continue aggressive IV diuretics, monitor I's and O's - Foley order placed if patient has ongoing obstructive issues but is making appropriate urine today with low post void residual volumes - Patient denies any history of obstructive uropathy but does have notable penile edema today - Last 2D echo 01/01/2021 revealed LVEF 40 to 45%, severely dilated left atrial size, moderately dilated right atrial size.  Tricuspid valve regurgitation mild to moderate.   AKI on CKD 3B vs progressive kidney disease Baseline creatinine appears to  be 1.9 with GFR of 31. Presented with creatinine of 2.15 with GFR of 27. Continue aggressive diuresis as above given possibility of cardiorenal syndrome Urinary obstruction also possibility for worsening creatinine but appears to be voiding today without issue  Elevated troponin, suspect demand ischemia in the setting of volume overload and chronic kidney disease Troponin minimally elevated 130/140 and flat Denies any anginal symptoms,diaphoresis nausea vomiting or neck chest arm pain EKG reassuringly without ST changes  Hypervolemic hyponatremia In the setting of above, follow with diuresis   Permanent A. fib on Coumadin INR at goal Coumadin ongoing, pharmacy to dose   Chronic anxiety/depression Resume home regimen   Pulmonary fibrosis Personally reviewed chest x-ray done on admission, showing bilateral pulmonary infiltrates similar to previous, suspected secondary to known pulmonary fibrosis Not hypoxic with O2 saturation in the mid 90s on room air.  Hypothyroidism Last TSH was 5.933 on 01/03/2021 Resume home regimen  Hyperlipidemia/GERD/ Resume home regimen.   Ambulatory dysfunction with history of fall Self-reported a fall days after discharge from previous admission, hitting his chest wall PT OT assessment Fall precautions TOC to assist with DC planning.   DVT prophylaxis: Coumadin Code Status: DNR Family Communication: Son at bedside  Status is: Inpatient  Dispo: The patient is from: Facility              Anticipated d/c is to: Facility              Anticipated d/c date is: 48 to 72 hours              Patient currently not medically stable for  discharge  Consultants:  None  Procedures:  None  Antimicrobials:  None indicated  Subjective: No acute issues or events overnight, clinically patient feels well, states he is swelling and edema is already improving but not yet back to baseline.  Otherwise in good spirits denies nausea vomiting diarrhea  constipation headache fevers chills chest pain or shortness of breath.  Objective: Vitals:   01/28/21 0530 01/28/21 0600 01/28/21 0700 01/28/21 0715  BP: 115/73 137/90 (!) 112/58 120/63  Pulse: 79 79 61 65  Resp: (!) 22 15 (!) 21 17  SpO2: 93% 98% 94% 95%    Intake/Output Summary (Last 24 hours) at 01/28/2021 0816 Last data filed at 01/28/2021 6269 Gross per 24 hour  Intake --  Output 300 ml  Net -300 ml   There were no vitals filed for this visit.  Examination:  General:  Pleasantly resting in bed, No acute distress. HEENT:  Normocephalic atraumatic.  Sclerae nonicteric, noninjected.  Extraocular movements intact bilaterally. Neck:  Without mass or deformity.  Trachea is midline. Lungs:  Clear to auscultate bilaterally without rhonchi, wheeze, or rales. Heart:  Regular rate and rhythm.  Without murmurs, rubs, or gallops. Abdomen:  Soft, nontender, nondistended.  Without guarding or rebound. GU: Penile edema, moderate without obstruction, no notable scrotal edema Extremities: 3+ pitting bilateral lower extremities to the proximal thigh Vascular:  Dorsalis pedis and posterior tibial pulses diminished bilaterally. Skin:  Warm and dry, no erythema, no ulcerations.  Data Reviewed: I have personally reviewed following labs and imaging studies  CBC: Recent Labs  Lab 01/27/21 2218 01/28/21 0436  WBC 8.3 8.9  HGB 11.7* 11.1*  HCT 36.7* 34.8*  MCV 94.8 95.1  PLT 168 485   Basic Metabolic Panel: Recent Labs  Lab 01/27/21 2218 01/28/21 0436  NA 128* 131*  K 4.3 3.9  CL 94* 91*  CO2 24 28  GLUCOSE 124* 110*  BUN 50* 50*  CREATININE 2.15* 2.07*  CALCIUM 8.7* 8.8*  MG 2.9* 2.6*  PHOS  --  3.6   GFR: Estimated Creatinine Clearance: 17.2 mL/min (A) (by C-G formula based on SCr of 2.07 mg/dL (H)). Liver Function Tests: Recent Labs  Lab 01/27/21 2218 01/28/21 0436  AST 24  --   ALT 14  --   ALKPHOS 159*  --   BILITOT 0.8  --   PROT 6.7  --   ALBUMIN 3.4* 3.3*    No results for input(s): LIPASE, AMYLASE in the last 168 hours. No results for input(s): AMMONIA in the last 168 hours. Coagulation Profile: Recent Labs  Lab 01/27/21 2218 01/28/21 0436  INR 2.3* 2.4*   Cardiac Enzymes: No results for input(s): CKTOTAL, CKMB, CKMBINDEX, TROPONINI in the last 168 hours. BNP (last 3 results) No results for input(s): PROBNP in the last 8760 hours. HbA1C: No results for input(s): HGBA1C in the last 72 hours. CBG: No results for input(s): GLUCAP in the last 168 hours. Lipid Profile: No results for input(s): CHOL, HDL, LDLCALC, TRIG, CHOLHDL, LDLDIRECT in the last 72 hours. Thyroid Function Tests: No results for input(s): TSH, T4TOTAL, FREET4, T3FREE, THYROIDAB in the last 72 hours. Anemia Panel: No results for input(s): VITAMINB12, FOLATE, FERRITIN, TIBC, IRON, RETICCTPCT in the last 72 hours. Sepsis Labs: No results for input(s): PROCALCITON, LATICACIDVEN in the last 168 hours.  No results found for this or any previous visit (from the past 240 hour(s)).   Radiology Studies: DG Chest Port 1 View  Result Date: 01/27/2021 CLINICAL DATA:  Shortness of breath  EXAM: PORTABLE CHEST 1 VIEW COMPARISON:  12/31/2020, CT 03/07/2014 FINDINGS: Post sternotomy changes. Peripheral and basilar reticular and ground-glass opacity, likely due to pulmonary fibrosis. No acute consolidation or pleural effusion. Stable cardiomegaly. No pneumothorax. IMPRESSION: No active disease. Similar bilateral peripheral and basilar reticular and ground-glass opacity likely due to chronic fibro interstitial disease. Cardiomegaly. Electronically Signed   By: Donavan Foil M.D.   On: 01/27/2021 22:13    Scheduled Meds:  cholecalciferol  1,000 Units Oral Daily   furosemide  20 mg Intravenous Q6H   levothyroxine  88 mcg Oral QAC breakfast   pantoprazole  40 mg Oral Daily   sertraline  25 mg Oral BID   simvastatin  10 mg Oral Daily   [START ON 01/29/2021] warfarin  1 mg Oral Once per  day on Mon Wed Fri   warfarin  1.5 mg Oral Once per day on Sun Tue Thu Sat   Warfarin - Pharmacist Dosing Inpatient   Does not apply q1600    LOS: 1 day   Time spent: 45min  Deitrich Steve C Rourke Mcquitty, DO [facility]  If 7PM-7AM, please contact night-coverage [email or web address]  01/28/2021, 8:16 AM

## 2021-01-29 ENCOUNTER — Inpatient Hospital Stay (HOSPITAL_COMMUNITY): Payer: Medicare Other

## 2021-01-29 DIAGNOSIS — R339 Retention of urine, unspecified: Secondary | ICD-10-CM

## 2021-01-29 DIAGNOSIS — R609 Edema, unspecified: Secondary | ICD-10-CM

## 2021-01-29 DIAGNOSIS — E8779 Other fluid overload: Secondary | ICD-10-CM

## 2021-01-29 DIAGNOSIS — I509 Heart failure, unspecified: Secondary | ICD-10-CM

## 2021-01-29 LAB — BASIC METABOLIC PANEL
Anion gap: 10 (ref 5–15)
BUN: 48 mg/dL — ABNORMAL HIGH (ref 8–23)
CO2: 28 mmol/L (ref 22–32)
Calcium: 8.5 mg/dL — ABNORMAL LOW (ref 8.9–10.3)
Chloride: 93 mmol/L — ABNORMAL LOW (ref 98–111)
Creatinine, Ser: 1.99 mg/dL — ABNORMAL HIGH (ref 0.61–1.24)
GFR, Estimated: 30 mL/min — ABNORMAL LOW (ref >60)
Glucose, Bld: 105 mg/dL — ABNORMAL HIGH (ref 70–99)
Potassium: 3.7 mmol/L (ref 3.5–5.1)
Sodium: 131 mmol/L — ABNORMAL LOW (ref 135–145)

## 2021-01-29 LAB — URINALYSIS, MICROSCOPIC (REFLEX)
Bacteria, UA: NONE SEEN
RBC / HPF: >50 RBC/hpf (ref 0–5)
Squamous Epithelial / HPF: NONE SEEN (ref 0–5)

## 2021-01-29 LAB — CBC
HCT: 30.7 % — ABNORMAL LOW (ref 39.0–52.0)
Hemoglobin: 9.9 g/dL — ABNORMAL LOW (ref 13.0–17.0)
MCH: 30 pg (ref 26.0–34.0)
MCHC: 32.2 g/dL (ref 30.0–36.0)
MCV: 93 fL (ref 80.0–100.0)
Platelets: 148 10*3/uL — ABNORMAL LOW (ref 150–400)
RBC: 3.3 MIL/uL — ABNORMAL LOW (ref 4.22–5.81)
RDW: 15.3 % (ref 11.5–15.5)
WBC: 6.5 10*3/uL (ref 4.0–10.5)
nRBC: 0 % (ref 0.0–0.2)

## 2021-01-29 LAB — URINALYSIS, ROUTINE W REFLEX MICROSCOPIC
Bilirubin Urine: NEGATIVE
Glucose, UA: NEGATIVE mg/dL
Ketones, ur: NEGATIVE mg/dL
Nitrite: NEGATIVE
Protein, ur: 100 mg/dL — AB
Specific Gravity, Urine: 1.01 (ref 1.005–1.030)
pH: 6.5 (ref 5.0–8.0)

## 2021-01-29 LAB — PROTIME-INR
INR: 2.5 — ABNORMAL HIGH (ref 0.8–1.2)
Prothrombin Time: 27 seconds — ABNORMAL HIGH (ref 11.4–15.2)

## 2021-01-29 MED ORDER — CEPHALEXIN 250 MG PO CAPS
500.0000 mg | ORAL_CAPSULE | Freq: Three times a day (TID) | ORAL | Status: DC
  Administered 2021-01-29 – 2021-01-30 (×2): 500 mg via ORAL
  Filled 2021-01-29 (×2): qty 2

## 2021-01-29 NOTE — Progress Notes (Signed)
Patient had some bleeding from Urethra. Approximate quarter sized clot passed. MD will be notified.

## 2021-01-29 NOTE — Evaluation (Signed)
Physical Therapy Evaluation Patient Details Name: [name] MRN: [id number] DOB: November 17, 1919 Today's Date: 01/29/2021   History of Present Illness  Pt is a 87 y/o male admitted from [facility] on 01/27/21 due to worsening BLE edema. Workup for volume overload due to acute on chronic systolic CHF superimposed by worsening renal function. PMH includes CHF, afib on Coumadin, pulmonary fibrosis, CKD 3B, HTN, MI, PNA, PVD.   Clinical Impression  Pt presents with an overall decrease in functional mobility secondary to above. PTA, pt resident at [facility] where he lives across the hall from his wife; pt mod indep mobilizing with rollator and scooter, staff and/or family assists with ADLs as needed. Educ on precautions, positioning, therex, and importance of mobility. Today, pt able to initiate transfer and gait training with rollator and min guard; pt demonstrates good awareness of activity pacing. Pt would benefit from continued acute PT services to maximize functional mobility and independence prior to d/c with HHPT services.   SpO2 >/91% on RA    Follow Up Recommendations Home health PT;Supervision - Intermittent    Equipment Recommendations  None recommended by PT    Recommendations for Other Services       Precautions / Restrictions Precautions Precautions: Fall;Other (comment) Precaution Comments: Pt with bleeding from penis 6/22 - may benefit from briefs/pad Restrictions Weight Bearing Restrictions: No      Mobility  Bed Mobility Overal bed mobility: Modified Independent Bed Mobility: Supine to Sit     Supine to sit: Modified independent (Device/Increase time);HOB elevated     General bed mobility comments: mod indep with use of bed rail    Transfers Overall transfer level: Needs assistance Equipment used: 4-wheeled walker Transfers: Sit to/from Stand Sit to Stand: Min guard;From elevated surface         General transfer comment: verbal cues to lock  rollator brakes, min guard for balance; increased time and effort  Ambulation/Gait Ambulation/Gait assistance: Min guard Gait Distance (Feet): 180 Feet Assistive device: 4-wheeled walker Gait Pattern/deviations: Step-through pattern;Decreased stride length;Trunk flexed Gait velocity: Decreased   General Gait Details: Slow, steady gait with rollator and min guard for balance; 1x standing rest break secondary to SOB  Stairs            Wheelchair Mobility    Modified Rankin (Stroke Patients Only)       Balance Overall balance assessment: Needs assistance Sitting-balance support: No upper extremity supported;Feet supported Sitting balance-Leahy Scale: Good     Standing balance support: Bilateral upper extremity supported;During functional activity Standing balance-Leahy Scale: Fair Standing balance comment: Can static stand without UE support; static and dynamic stability improved with rollator                             Pertinent Vitals/Pain Pain Assessment: No/denies pain    Home Living Family/patient expects to be discharged to:: Assisted living               Home Equipment: Walker - 4 wheels;Grab bars - toilet;Shower seat - built in;Hand held shower head;Grab bars - tub/shower;Electric scooter Additional Comments: Resides at [facility], wife lives across the hall    Prior Function Level of Independence: Needs assistance   Gait / Transfers Assistance Needed: uses rollator for short distances, scooter for longer distances  ADL's / Homemaking Assistance Needed: reports independent with ADLs, sometimes wife assist with LB dressing; 2 showers a week with assist from staff; toilets  independently; assist with meals and meds from staff  Comments: reports using AE for LB dressing, recent fall (forgetting to lock rollator brakes)     Hand Dominance   Dominant Hand: Right    Extremity/Trunk Assessment   Upper Extremity Assessment Upper  Extremity Assessment: Generalized weakness    Lower Extremity Assessment Lower Extremity Assessment: Generalized weakness (bilateral lower leg/foot swelling)    Cervical / Trunk Assessment Cervical / Trunk Assessment: Kyphotic  Communication   Communication: HOH  Cognition Arousal/Alertness: Awake/alert Behavior During Therapy: WFL for tasks assessed/performed Overall Cognitive Status: Within Functional Limits for tasks assessed                                 General Comments: age related decreased STM, otherwise [facility]      General Comments General comments (skin integrity, edema, etc.): SpO2 >/91% on RA    Exercises     Assessment/Plan    PT Assessment Patient needs continued PT services  PT Problem List Decreased strength;Decreased activity tolerance;Decreased balance;Decreased mobility;Cardiopulmonary status limiting activity       PT Treatment Interventions DME instruction;Gait training;Functional mobility training;Therapeutic activities;Therapeutic exercise;Balance training;Patient/family education;Wheelchair mobility training    PT Goals (Current goals can be found in the Care Plan section)  Acute Rehab PT Goals Patient Stated Goal: Return home PT Goal Formulation: With patient Time For Goal Achievement: 02/12/21 Potential to Achieve Goals: Good    Frequency Min 3X/week   Barriers to discharge        Co-evaluation               AM-PAC PT "6 Clicks" Mobility  Outcome Measure Help needed turning from your back to your side while in a flat bed without using bedrails?: None Help needed moving from lying on your back to sitting on the side of a flat bed without using bedrails?: A Little Help needed moving to and from a bed to a chair (including a wheelchair)?: A Little Help needed standing up from a chair using your arms (e.g., wheelchair or bedside chair)?: A Little Help needed to walk in hospital room?: A Little Help needed climbing 3-5  steps with a railing? : A Lot 6 Click Score: 18    End of Session Equipment Utilized During Treatment: Gait belt Activity Tolerance: Patient tolerated treatment well Patient left: in chair;with call bell/phone within reach;with chair alarm set;with SCD's reapplied Nurse Communication: Mobility status PT Visit Diagnosis: Other abnormalities of gait and mobility (R26.89);Muscle weakness (generalized) (M62.81)    Time: 7124-5809 PT Time Calculation (min) (ACUTE ONLY): 20 min   Charges:   PT Evaluation $PT Eval Moderate Complexity: [place], PT, DPT Acute Rehabilitation Services  Pager [phone] Office [place] 01/29/2021, 10:32 AM

## 2021-01-29 NOTE — Progress Notes (Signed)
[facility] for Warfarin  Indication: atrial fibrillation  Allergies  Allergen Reactions   Ambien [Zolpidem] Other (See Comments)    unknown   Clindamycin/Lincomycin Other (See Comments)    unknown   Penicillins Rash    Vital Signs: Temp: 98.2 F (36.8 C) (06/22 0352) Temp Source: Oral (06/22 0352) BP: 113/65 (06/22 0742) Pulse Rate: 86 (06/22 0742)  Labs: Recent Labs    01/27/21 0055 01/27/21 2218 01/27/21 2218 01/28/21 0237 01/28/21 0436 01/29/21 0300  HGB  --  11.7*   < >  --  11.1* 9.9*  HCT  --  36.7*  --   --  34.8* 30.7*  PLT  --  168  --   --  175 148*  LABPROT  --  25.1*  --   --  26.3* 27.0*  INR  --  2.3*  --   --  2.4* 2.5*  CREATININE  --  2.15*  --   --  2.07* 1.99*  TROPONINIHS 125* 134*  --  141*  --   --    < > = values in this interval not displayed.    Estimated Creatinine Clearance: 18.8 mL/min (A) (by C-G formula based on SCr of 1.99 mg/dL (H)).  Assessment: 87 y/o M presents to the ED with volume overload, on warfarin PTA for afib, INR is therapeutic at 2.3 on admission  Warfarin PTA dosing: 1 mg Mon/Wed/Fri, 1.5 mg Tues/Thurs/Sat/Sun  INR 2.5 today  Goal of Therapy:  INR 2-3 Monitor platelets by anticoagulation protocol: Yes   Plan:  Cont warfarin PTA dosing Daily PT/INR Monitor for bleeding  Thank you Anette Guarneri, PharmD  Please check AMION for all [place] phone numbers After 10:00 PM, call [place]

## 2021-01-29 NOTE — Progress Notes (Addendum)
PROGRESS NOTE    [name]  YQM:[id number] DOB: 02/19/1920 DOA: 01/27/2021 PCP: [name], [name], NP   Brief Narrative:  HPI on 01/27/2021 by Dr. Ian [name] is a 87 y.o. male with medical history significant for chronic systolic CHF, permanent atrial fibrillation on Coumadin, pulmonary fibrosis, GERD, chronic constipation, hypothyroidism, anemia of chronic disease, hyperlipidemia, CKD 3B, who presented to [facility] due to worsening bilateral lower extremity edema up to his groins.  Associated with orthopnea.  Admits to intermittent nonproductive cough which is chronic.  He denies any chest pain, dyspnea or palpitations.  Denies any constitutional symptoms.  Denies excessive use of salt in his diet.  Has been compliant with his medications including diuretics, p.o. Lasix 80 mg daily.   Was brought into the ED for further evaluation.  While in the ED he was found to have 3+ pitting edema all the way up to his perineal region with concern for volume overload.  Received 1 dose of IV Lasix 80 mg [name] 1.  TRH, hospitalist team, was asked to admit.    Interim history Patient admitted with anasarca/volume overload with CHF exacerbation and placed on IV Lasix.  Patient did have elevated troponin which was likely demand ischemia.  This morning however, patient did develop urethral blood clots.  Discussed with urology, and recommended outpatient follow-up if no issues with urination. Assessment & Plan   Acute systolic CHF exacerbation with volume overload and anasarca -Possibly secondary to medication or dietary noncompliance -Patient did present with shortness of breath and notably physically appear to be volume overloaded -Chest [name]-ray unremarkable for infection however upon review of the chest [name]-ray, does appear to have vascular congestion -BNP on admission 1003.6 -Continue IV Lasix, 40 mg IV twice daily -Echocardiogram 01/01/2021 showed an EF of 40 to 45%, severely dilated  left atrial size, moderately dilated right atrial size.  Tricuspid valve regurgitation mild to moderate. -Continue to monitor daily weights, intake and output  Acute kidney injury on chronic kidney disease, stage IIIb -Baseline creatinine appears to be 1.9 -GFR on admission 2.15, GFR 27 -Creatinine today 1.99 -Continue to monitor carefully given IV Lasix -Pending bladder scan  Urethral bleeding/hematuria -Patient with noted frank hematuria and blood clots from the urethra today -Discussed with Dr. Matilde [name], urology, recommended obtaining UA and urine culture, placing patient on an antibiotic, with outpatient follow-up.  If patient has difficulty with urination, will formally consult. -Patient currently denies any difficulty with urination or hesitancy or pain -Bladder scan pending -Patient is on Coumadin, will monitor H&H closely  Elevated troponin -Suspect demand ischemia in the setting of CHF exacerbation and chronic kidney disease -Troponin elevation flat, 130->140 -Currently denies any chest pain, diuresis, nausea, neck or arm pain -EKG without ST changes  Hypervolemic hyponatremia -Likely secondary to diuresis, currently 131, -will continue to monitor BMP  Chronic normocytic anemia -Hemoglobin currently 9.9, baseline hemoglobin approximately 10-11 -Will continue to monitor H&H closely  Permanent atrial fibrillation -Currently on Coumadin, INR appears to be at goal.  Pharmacy dosing  Chronic anxiety/depression -Continue Zoloft, Valium as needed  Pulmonary fibrosis -Chest [name]-ray on admission noted bilateral pulmonary infiltrates, similar to previous -Currently patient on room air and maintaining oxygen saturations in the 90s  Hypothyroidism -TSH 5.933 on 01/03/2021 -Continue home regimen  Hyperlipidemia -Continue statin  GERD -Continue PPI  Ambulatory dysfunction with history of fall -Patient fell several weeks ago and noted to have wound on head -PT and OT  evaluations  pending -Continue fall precautions  DVT Prophylaxis  Coumadin  Code Status: DNR  Family Communication: None at bedside  Disposition Plan:  Status is: Inpatient  Remains inpatient appropriate because:Persistent severe electrolyte disturbances, IV treatments appropriate due to intensity of illness or inability to take PO, and Inpatient level of care appropriate due to severity of illness  Dispo:  Patient From: [place]  Planned Disposition: [place]  Medically stable for discharge: No     Consultants Urology, Dr. Matilde [name], via phone  Procedures  None  Antibiotics   Anti-infectives (From admission, onward)    None       Subjective:   [name] seen and examined today.  Feels her leg swelling has improved.  Does not feel that his legs are back to baseline.  Has some shortness of breath with movement otherwise fine at rest.  Denies chest pain, abdominal pain, nausea or vomiting, diarrhea or constipation, dizziness or headache.  Noticed blood clots when urinating however states it is painless and has not had issues with urination.   Objective:   Vitals:   01/29/21 0013 01/29/21 0352 01/29/21 0742 01/29/21 0757  BP: 111/84 102/60 113/65   Pulse: 89 86 86   Resp: 18 19 (!) 23   Temp: 98.3 F (36.8 C) 98.2 F (36.8 C) 97.9 F (36.6 C)   TempSrc: Oral Oral Oral   SpO2: 97% 92% 90% 92%  Weight:  71.8 kg    Height:        Intake/Output Summary (Last 24 hours) at 01/29/2021 1008 Last data filed at 01/29/2021 0330 Gross per 24 hour  Intake 120 ml  Output 1750 ml  Net -1630 ml   Filed Weights   01/28/21 1001 01/29/21 0352  Weight: 73.7 kg 71.8 kg    Exam General: Well developed, well nourished, elderly, NAD, appears stated age 24: NCAT, mucous membranes moist.  Neck: Supple Cardiovascular: S1 S2 auscultated, RRR Respiratory: Clear to auscultation bilaterally, no wheezing Abdomen: Soft, nontender,  nondistended, + bowel sounds GU: Penile edema with frank blood from urethral orifice Extremities: warm dry without cyanosis clubbing. +++LE edema Bilaterally Neuro: AAOx3, nonfocal Skin: Without rashes exudates or nodules Psych: Appropriate mood and affect, pleasant    Data Reviewed: I have personally reviewed following labs and imaging studies  CBC: Recent Labs  Lab 01/27/21 2218 01/28/21 0436 01/29/21 0300  WBC 8.3 8.9 6.5  HGB 11.7* 11.1* 9.9*  HCT 36.7* 34.8* 30.7*  MCV 94.8 95.1 93.0  PLT 168 175 782*   Basic Metabolic Panel: Recent Labs  Lab 01/27/21 2218 01/28/21 0436 01/29/21 0300  NA 128* 131* 131*  K 4.3 3.9 3.7  CL 94* 91* 93*  CO2 24 28 28   GLUCOSE 124* 110* 105*  BUN 50* 50* 48*  CREATININE 2.15* 2.07* 1.99*  CALCIUM 8.7* 8.8* 8.5*  MG 2.9* 2.6*  --   PHOS  --  3.6  --    GFR: Estimated Creatinine Clearance: 18.8 mL/min (A) (by C-G formula based on SCr of 1.99 mg/dL (H)). Liver Function Tests: Recent Labs  Lab 01/27/21 2218 01/28/21 0436  AST 24  --   ALT 14  --   ALKPHOS 159*  --   BILITOT 0.8  --   PROT 6.7  --   ALBUMIN 3.4* 3.3*   No results for input(s): LIPASE, AMYLASE in the last 168 hours. No results for input(s): AMMONIA in the last 168 hours. Coagulation Profile: Recent Labs  Lab 01/27/21 2218 01/28/21 0436  01/29/21 0300  INR 2.3* 2.4* 2.5*   Cardiac Enzymes: No results for input(s): CKTOTAL, CKMB, CKMBINDEX, TROPONINI in the last 168 hours. BNP (last 3 results) No results for input(s): PROBNP in the last 8760 hours. HbA1C: No results for input(s): HGBA1C in the last 72 hours. CBG: No results for input(s): GLUCAP in the last 168 hours. Lipid Profile: No results for input(s): CHOL, HDL, LDLCALC, TRIG, CHOLHDL, LDLDIRECT in the last 72 hours. Thyroid Function Tests: No results for input(s): TSH, T4TOTAL, FREET4, T3FREE, THYROIDAB in the last 72 hours. Anemia Panel: No results for input(s): VITAMINB12, FOLATE, FERRITIN,  TIBC, IRON, RETICCTPCT in the last 72 hours. Urine analysis:    Component Value Date/Time   COLORURINE YELLOW 01/27/2021 2202   APPEARANCEUR HAZY (A) 01/27/2021 2202   LABSPEC 1.011 01/27/2021 2202   PHURINE 5.0 01/27/2021 2202   GLUCOSEU NEGATIVE 01/27/2021 2202   HGBUR NEGATIVE 01/27/2021 2202   BILIRUBINUR NEGATIVE 01/27/2021 2202   KETONESUR NEGATIVE 01/27/2021 2202   PROTEINUR NEGATIVE 01/27/2021 2202   UROBILINOGEN 0.2 03/27/2010 2223   NITRITE NEGATIVE 01/27/2021 2202   LEUKOCYTESUR NEGATIVE 01/27/2021 2202   Sepsis Labs: @LABRCNTIP (procalcitonin:4,lacticidven:4)  )No results found for this or any previous visit (from the past 240 hour(s)).    Radiology Studies: DG Chest Port 1 View  Result Date: 01/27/2021 CLINICAL DATA:  Shortness of breath EXAM: PORTABLE CHEST 1 VIEW COMPARISON:  12/31/2020, CT 03/07/2014 FINDINGS: Post sternotomy changes. Peripheral and basilar reticular and ground-glass opacity, likely due to pulmonary fibrosis. No acute consolidation or pleural effusion. Stable cardiomegaly. No pneumothorax. IMPRESSION: No active disease. Similar bilateral peripheral and basilar reticular and ground-glass opacity likely due to chronic fibro interstitial disease. Cardiomegaly. Electronically Signed   By: Donavan Foil M.D.   On: 01/27/2021 22:13     Scheduled Meds:  cholecalciferol  1,000 Units Oral Daily   furosemide  40 mg Intravenous Q12H   levothyroxine  88 mcg Oral QAC breakfast   pantoprazole  40 mg Oral Daily   sertraline  25 mg Oral BID   simvastatin  10 mg Oral Daily   warfarin  1 mg Oral Once per day on Mon Wed Fri   warfarin  1.5 mg Oral Once per day on Sun Tue Thu Sat   Warfarin - Pharmacist Dosing Inpatient   Does not apply q1600   Continuous Infusions:   LOS: 2 days   Time Spent in minutes   45 minutes  Artemio Dobie D.O. on 01/29/2021 at 10:08 AM  Between 7am to 7pm - Please see pager noted on [email or web address]  After 7pm go to  [email or web address]  And look for the night coverage person covering for me after hours  [facility] Office  [phone]

## 2021-01-29 NOTE — Progress Notes (Signed)
Occupational Therapy Treatment Patient Details Name: [name] MRN: [id number] DOB: 1919/12/20 Today's Date: 01/29/2021    History of present illness Pt is a 87 y/o male admitted from [facility] on 01/27/21 due to worsening BLE edema. Workup for volume overload due to acute on chronic systolic CHF superimposed by worsening renal function. PMH includes CHF, afib on Coumadin, pulmonary fibrosis, CKD 3B, HTN, MI, PNA, PVD.   OT comments  Pt progressing well with OT goals. Pt able to continue to ambulate at a min guard level using a RW. Pt completed toileting and grooming this session and continues to demonstrate motivation, as well as no decrease in weakness. Pt activity tolerance is still limited, however pt is able to take short standing breaks then continue with his tasks. Acute OT will continue to follow to ensure pt safety in working towards his goals.    Follow Up Recommendations  Home health OT;Supervision/Assistance - 24 hour    Equipment Recommendations  3 in 1 bedside commode    Recommendations for Other Services      Precautions / Restrictions Precautions Precautions: Fall;Other (comment) Precaution Comments: Pt with bleeding from penis 6/22 - may benefit from briefs/pad Restrictions Weight Bearing Restrictions: No       Mobility Bed Mobility               General bed mobility comments: On BSC on arrival, in recliner on exit.    Transfers Overall transfer level: Needs assistance Equipment used: Rolling walker (2 wheeled) Transfers: Sit to/from Stand Sit to Stand: Min guard;From elevated surface         General transfer comment: Min guard to steady    Balance Overall balance assessment: Mild deficits observed, not formally tested                                         ADL either performed or assessed with clinical judgement   ADL Overall ADL's : Needs assistance/impaired     Grooming: Wash/dry hands;Standing Grooming  Details (indicate cue type and reason): At sink     Lower Body Bathing: Moderate assistance;Sit to/from stand Lower Body Bathing Details (indicate cue type and reason): completed on [name]         Toilet Transfer: Min guard;Ambulation;RW Toilet Transfer Details (indicate cue type and reason): BSC on other side of room Toileting- Clothing Manipulation and Hygiene: Minimal assistance;Sit to/from stand Toileting - Clothing Manipulation Details (indicate cue type and reason): for hygiene     Functional mobility during ADLs: Min guard;Rolling walker General ADL Comments: Pt limited by decreased activity tolerance and weakness.     Vision       Perception     Praxis      Cognition Arousal/Alertness: Awake/alert Behavior During Therapy: WFL for tasks assessed/performed Overall Cognitive Status: Within Functional Limits for tasks assessed                                 General Comments: age related decreased STM, otherwise [name]        Exercises     Shoulder Instructions       General Comments VSS on RA    Pertinent Vitals/ Pain       Pain Assessment: No/denies pain  Home Living  Prior Functioning/Environment              Frequency  Min 2X/week        Progress Toward Goals  OT Goals(current goals can now be found in the care plan section)  Progress towards OT goals: Progressing toward goals  Acute Rehab OT Goals Patient Stated Goal: Return home OT Goal Formulation: With patient Time For Goal Achievement: 02/11/21 Potential to Achieve Goals: Good ADL Goals Pt Will Perform Grooming: with modified independence;standing;sitting Pt Will Perform Lower Body Dressing: with modified independence;sit to/from stand Pt Will Transfer to Toilet: with modified independence;ambulating;bedside commode Pt Will Perform Toileting - Clothing Manipulation and hygiene: with modified independence;sit  to/from stand Additional ADL Goal #1: Pt will verbalize 3 fall prevention techniques to optimize safety with ADL routine.  Plan Discharge plan remains appropriate;Frequency remains appropriate    Co-evaluation                 AM-PAC OT "6 Clicks" Daily Activity     Outcome Measure   Help from another person eating meals?: None Help from another person taking care of personal grooming?: A Little Help from another person toileting, which includes using toliet, bedpan, or urinal?: A Little Help from another person bathing (including washing, rinsing, drying)?: A Lot Help from another person to put on and taking off regular upper body clothing?: None Help from another person to put on and taking off regular lower body clothing?: A Little 6 Click Score: 19    End of Session Equipment Utilized During Treatment: Rolling walker  OT Visit Diagnosis: Other abnormalities of gait and mobility (R26.89);Muscle weakness (generalized) (M62.81);History of falling (Z91.81)   Activity Tolerance Patient tolerated treatment well   Patient Left in chair;with call bell/phone within reach;with chair alarm set   Nurse Communication Mobility status (Pt passing large blood clot on BSC)        Time: 1660-6301 OT Time Calculation (min): 20 min  Charges: OT General Charges $OT Visit: 1 Visit OT Treatments $Self Care/Home Management : 8-22 mins  [name] H., OTR/L Acute Rehabilitation   [name] [name] 01/29/2021, 5:29 PM
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
## 2021-01-30 DIAGNOSIS — K5900 Constipation, unspecified: Secondary | ICD-10-CM

## 2021-01-30 LAB — BASIC METABOLIC PANEL
Anion gap: 9 (ref 5–15)
BUN: 44 mg/dL — ABNORMAL HIGH (ref 8–23)
CO2: 30 mmol/L (ref 22–32)
Calcium: 8.7 mg/dL — ABNORMAL LOW (ref 8.9–10.3)
Chloride: 93 mmol/L — ABNORMAL LOW (ref 98–111)
Creatinine, Ser: 2.07 mg/dL — ABNORMAL HIGH (ref 0.61–1.24)
GFR, Estimated: 28 mL/min — ABNORMAL LOW (ref >60)
Glucose, Bld: 108 mg/dL — ABNORMAL HIGH (ref 70–99)
Potassium: 3.7 mmol/L (ref 3.5–5.1)
Sodium: 132 mmol/L — ABNORMAL LOW (ref 135–145)

## 2021-01-30 LAB — CBC
HCT: 31.4 % — ABNORMAL LOW (ref 39.0–52.0)
Hemoglobin: 10.2 g/dL — ABNORMAL LOW (ref 13.0–17.0)
MCH: 30.1 pg (ref 26.0–34.0)
MCHC: 32.5 g/dL (ref 30.0–36.0)
MCV: 92.6 fL (ref 80.0–100.0)
Platelets: 141 10*3/uL — ABNORMAL LOW (ref 150–400)
RBC: 3.39 MIL/uL — ABNORMAL LOW (ref 4.22–5.81)
RDW: 15.4 % (ref 11.5–15.5)
WBC: 7.5 10*3/uL (ref 4.0–10.5)
nRBC: 0 % (ref 0.0–0.2)

## 2021-01-30 LAB — URINE CULTURE: Culture: NO GROWTH

## 2021-01-30 LAB — PROTIME-INR
INR: 2.4 — ABNORMAL HIGH (ref 0.8–1.2)
Prothrombin Time: 26 seconds — ABNORMAL HIGH (ref 11.4–15.2)

## 2021-01-30 MED ORDER — LEVOTHYROXINE SODIUM 88 MCG PO TABS
88.0000 ug | ORAL_TABLET | Freq: Every day | ORAL | Status: DC
  Administered 2021-01-30 – 2021-02-01 (×3): 88 ug via ORAL
  Filled 2021-01-30 (×3): qty 1

## 2021-01-30 MED ORDER — SENNOSIDES-DOCUSATE SODIUM 8.6-50 MG PO TABS
1.0000 | ORAL_TABLET | Freq: Every day | ORAL | Status: DC
  Administered 2021-01-30 – 2021-01-31 (×2): 1 via ORAL
  Filled 2021-01-30 (×2): qty 1

## 2021-01-30 MED ORDER — FUROSEMIDE 10 MG/ML IJ SOLN
40.0000 mg | Freq: Two times a day (BID) | INTRAMUSCULAR | Status: DC
  Administered 2021-01-30 – 2021-01-31 (×3): 40 mg via INTRAVENOUS
  Filled 2021-01-30 (×3): qty 4

## 2021-01-30 MED ORDER — CEPHALEXIN 250 MG PO CAPS
500.0000 mg | ORAL_CAPSULE | Freq: Two times a day (BID) | ORAL | Status: DC
  Administered 2021-01-30 – 2021-02-01 (×4): 500 mg via ORAL
  Filled 2021-01-30 (×4): qty 2

## 2021-01-30 NOTE — Care Management Important Message (Signed)
Important Message  Patient Details  Name: [name] MRN: [id number] Date of Birth: [birth year]   Medicare Important Message Given:  Yes     [name] 01/30/2021, 2:35 PM

## 2021-01-30 NOTE — TOC Progression Note (Signed)
Transition of Care [facility]) - Progression Note    Patient Details  Name: [name] MRN: [id number] Date of Birth: 19-Jan-1920  Transition of Care [facility] CM/SW Contact  Zenon Mayo, RN Phone Number: 01/30/2021, 3:56 PM  Clinical Narrative:    Patient is from [facility], per pt rec HHPT/HHOT. Will need to be requested on FL2.        Expected Discharge Plan and Services                                                 Social Determinants of Health (SDOH) Interventions    Readmission Risk Interventions No flowsheet data found.

## 2021-01-30 NOTE — Progress Notes (Signed)
[place] for Warfarin  Indication: atrial fibrillation  Allergies  Allergen Reactions   Ambien [Zolpidem] Other (See Comments)    unknown   Clindamycin/Lincomycin Other (See Comments)    unknown   Penicillins Rash    Vital Signs: Temp: 97.9 F (36.6 C) (06/23 0728) Temp Source: Oral (06/23 0728) BP: 105/58 (06/23 0728) Pulse Rate: 100 (06/23 0728)  Labs: Recent Labs    01/27/21 2218 01/28/21 0237 01/28/21 0436 01/29/21 0300 01/30/21 0312  HGB 11.7*  --  11.1* 9.9* 10.2*  HCT 36.7*  --  34.8* 30.7* 31.4*  PLT 168  --  175 148* 141*  LABPROT 25.1*  --  26.3* 27.0* 26.0*  INR 2.3*  --  2.4* 2.5* 2.4*  CREATININE 2.15*  --  2.07* 1.99* 2.07*  TROPONINIHS 134* 141*  --   --   --     Estimated Creatinine Clearance: 18.1 mL/min (A) (by C-G formula based on SCr of 2.07 mg/dL (H)).  Assessment: 87 y/o M presents to the ED with volume overload, on warfarin PTA for afib, INR is therapeutic at 2.3 on admission  Warfarin PTA dosing: 1 mg Mon/Wed/Fri, 1.5 mg Tues/Thurs/Sat/Sun  INR therapeutic  Ongoing hematuria, HgB stable  Goal of Therapy:  INR 2-3 Monitor platelets by anticoagulation protocol: Yes   Plan:  Cont warfarin PTA dosing Daily PT/INR ? Stop wwarfarin  Thank you Anette Guarneri, PharmD  Please check AMION for all [place] phone numbers After 10:00 PM, call [place] [phone]

## 2021-01-30 NOTE — Consult Note (Signed)
   [facility] Inpatient Consult   01/30/2021  FIELDING MAULT 1920-07-06 [id number]  [place] [facility] [ACO] Patient: Medicare CMS DCE  Primary Care Provider:  [name], [name], NP is listed to have post hospital TOC follow up calls and appointments.  Patient screened for less than 30 days re-hospitalization with noted high risk score for unplanned readmission risk and to assess for potential [place] Management service needs for post hospital transition.  Review of patient's medical record reveals patient is from [facility] facility and PT/OT is recommending home health.   Plan:  No current [facility] care management needs assessed as patient .    For questions contact:   Natividad Brood, RN BSN [facility] Liaison  [phone] business mobile phone Toll free office [phone]  Fax number: [phone] [email or web address]

## 2021-01-30 NOTE — Progress Notes (Signed)
TRH night shift.  The nursing staff reported that the patient is constipated and requested a stool softener.  Senokot-S one tablet orally nightly ordered.  Tennis Must, MD.

## 2021-01-30 NOTE — Progress Notes (Signed)
PROGRESS NOTE    [name]  XFG:[id number] DOB: February 21, 1920 DOA: 01/27/2021 PCP: [name], [name], NP   Brief Narrative:  HPI on 01/27/2021 by Dr. Ian Malkin is a 87 y.o. male with medical history significant for chronic systolic CHF, permanent atrial fibrillation on Coumadin, pulmonary fibrosis, GERD, chronic constipation, hypothyroidism, anemia of chronic disease, hyperlipidemia, CKD 3B, who presented to [facility] ED [facility] due to worsening bilateral lower extremity edema up to his groins.  Associated with orthopnea.  Admits to intermittent nonproductive cough which is chronic.  He denies any chest pain, dyspnea or palpitations.  Denies any constitutional symptoms.  Denies excessive use of salt in his diet.  Has been compliant with his medications including diuretics, p.o. Lasix 80 mg daily.   Was brought into the ED for further evaluation.  While in the ED he was found to have 3+ pitting edema all the way up to his perineal region with concern for volume overload.  Received 1 dose of IV Lasix 80 mg x 1.  TRH, hospitalist team, was asked to admit.    Interim history Patient admitted with anasarca/volume overload with CHF exacerbation and placed on IV Lasix.  Patient did have elevated troponin which was likely demand ischemia.  This morning however, patient did develop urethral blood clots.  Discussed with urology, and recommended outpatient follow-up if no issues with urination. Assessment & Plan   Acute systolic CHF exacerbation with volume overload and anasarca -Possibly secondary to medication or dietary noncompliance -Patient did present with shortness of breath and notably physically appear to be volume overloaded -Chest x-ray unremarkable for infection however upon review of the chest x-ray, does appear to have vascular congestion -BNP on admission 1003.6 -Continue IV Lasix, 40 mg IV twice daily -Echocardiogram 01/01/2021 showed an EF of 40 to 45%, severely dilated  left atrial size, moderately dilated right atrial size.  Tricuspid valve regurgitation mild to moderate. -Continue to monitor daily weights, intake and output  Acute kidney injury on chronic kidney disease, stage IIIb -Baseline creatinine appears to be 1.9 -GFR on admission 2.15, GFR 27 -Creatinine today 2.07 -Continue to monitor carefully given IV Lasix  Urethral bleeding/hematuria -Patient with noted frank hematuria and blood clots from the urethra today -Discussed with Dr. Matilde Sprang, urology, recommended obtaining UA and urine culture, placing patient on an antibiotic, with outpatient follow-up.  If patient has difficulty with urination, will formally consult. -Patient currently denies any difficulty with urination or hesitancy or pain -Per patient has resolved.  -UA unremarkable for infection, but started on keflex by urology  -Urine culture shows no growth  -Patient is on Coumadin, will monitor H&H closely- hemoglobin stable 10.2  Elevated troponin -Suspect demand ischemia in the setting of CHF exacerbation and chronic kidney disease -Troponin elevation flat, 130->140 -Currently denies any chest pain, diuresis, nausea, neck or arm pain -EKG without ST changes  Hypervolemic hyponatremia -Likely secondary to diuresis, currently 131, -will continue to monitor BMP  Chronic normocytic anemia -Hemoglobin currently 10.2, baseline hemoglobin approximately 10-11 -Will continue to monitor H&H closely  Permanent atrial fibrillation -Currently on Coumadin, INR appears to be at goal.  Pharmacy dosing  Chronic anxiety/depression -Continue Zoloft, Valium as needed  Pulmonary fibrosis -Chest x-ray on admission noted bilateral pulmonary infiltrates, similar to previous -Currently patient on room air and maintaining oxygen saturations in the 90s  Hypothyroidism -TSH 5.933 on 01/03/2021 -Continue home regimen  Hyperlipidemia -Continue statin  GERD -Continue PPI  Ambulatory  dysfunction with  history of fall -Patient fell several weeks ago and noted to have wound on head -PT and OT recommended HH -Continue fall precautions  DVT Prophylaxis  Coumadin  Code Status: DNR  Family Communication: None at bedside  Disposition Plan:  Status is: Inpatient  Remains inpatient appropriate because:Persistent severe electrolyte disturbances, IV treatments appropriate due to intensity of illness or inability to take PO, and Inpatient level of care appropriate due to severity of illness  Dispo:  Patient From: [place]  Planned Disposition: [place]  Medically stable for discharge: No     Consultants Urology, Dr. Matilde Sprang  Procedures  None  Antibiotics   Anti-infectives (From admission, onward)    Start     Dose/Rate Route Frequency Ordered Stop   01/30/21 2200  cephALEXin (KEFLEX) capsule 500 mg        500 mg Oral Every 12 hours 01/30/21 0802     01/29/21 2000  cephALEXin (KEFLEX) capsule 500 mg  Status:  Discontinued        500 mg Oral Every 8 hours 01/29/21 1742 01/30/21 0802       Subjective:   [name] seen and examined today.  Feels leg swelling has improved.  Denies current chest pain, abdominal pain, nausea vomiting, diarrhea constipation, dizziness or headache.  Has not noticed any further blood clots while urinating.   Objective:   Vitals:   01/30/21 0200 01/30/21 0341 01/30/21 0728 01/30/21 1137  BP:   (!) 105/58 126/61  Pulse:   100 100  Resp: 20  15 19   Temp:  97.8 F (36.6 C) 97.9 F (36.6 C) (!) 97.4 F (36.3 C)  TempSrc:  Oral Oral Oral  SpO2: 92%  91% 94%  Weight:  71.8 kg    Height:        Intake/Output Summary (Last 24 hours) at 01/30/2021 1336 Last data filed at 01/30/2021 1318 Gross per 24 hour  Intake 1200 ml  Output 2550 ml  Net -1350 ml    Filed Weights   01/28/21 1001 01/29/21 0352 01/30/21 0341  Weight: 73.7 kg 71.8 kg 71.8 kg   Exam General: Well developed, well  nourished, elderly, NAD HEENT: NCAT, mucous membranes moist.  Cardiovascular: S1 S2 auscultated, irregular Respiratory: Diminished breath sounds but clear, no wheezing Abdomen: Soft, nontender, nondistended, + bowel sounds Extremities: warm dry without cyanosis clubbing. + LE edema bilaterally Neuro: AAOx3, nonfocal Skin: Bruising noted on patient's left lower back and side Psych: appropriate mood and affect, pleasant   Data Reviewed: I have personally reviewed following labs and imaging studies  CBC: Recent Labs  Lab 01/27/21 2218 01/28/21 0436 01/29/21 0300 01/30/21 0312  WBC 8.3 8.9 6.5 7.5  HGB 11.7* 11.1* 9.9* 10.2*  HCT 36.7* 34.8* 30.7* 31.4*  MCV 94.8 95.1 93.0 92.6  PLT 168 175 148* 141*    Basic Metabolic Panel: Recent Labs  Lab 01/27/21 2218 01/28/21 0436 01/29/21 0300 01/30/21 0312  NA 128* 131* 131* 132*  K 4.3 3.9 3.7 3.7  CL 94* 91* 93* 93*  CO2 24 28 28 30   GLUCOSE 124* 110* 105* 108*  BUN 50* 50* 48* 44*  CREATININE 2.15* 2.07* 1.99* 2.07*  CALCIUM 8.7* 8.8* 8.5* 8.7*  MG 2.9* 2.6*  --   --   PHOS  --  3.6  --   --     GFR: Estimated Creatinine Clearance: 18.1 mL/min (A) (by C-G formula based on SCr of 2.07 mg/dL (H)). Liver Function Tests: Recent Labs  Lab 01/27/21 2218 01/28/21 0436  AST 24  --   ALT 14  --   ALKPHOS 159*  --   BILITOT 0.8  --   PROT 6.7  --   ALBUMIN 3.4* 3.3*    No results for input(s): LIPASE, AMYLASE in the last 168 hours. No results for input(s): AMMONIA in the last 168 hours. Coagulation Profile: Recent Labs  Lab 01/27/21 2218 01/28/21 0436 01/29/21 0300 01/30/21 0312  INR 2.3* 2.4* 2.5* 2.4*    Cardiac Enzymes: No results for input(s): CKTOTAL, CKMB, CKMBINDEX, TROPONINI in the last 168 hours. BNP (last 3 results) No results for input(s): PROBNP in the last 8760 hours. HbA1C: No results for input(s): HGBA1C in the last 72 hours. CBG: No results for input(s): GLUCAP in the last 168 hours. Lipid  Profile: No results for input(s): CHOL, HDL, LDLCALC, TRIG, CHOLHDL, LDLDIRECT in the last 72 hours. Thyroid Function Tests: No results for input(s): TSH, T4TOTAL, FREET4, T3FREE, THYROIDAB in the last 72 hours. Anemia Panel: No results for input(s): VITAMINB12, FOLATE, FERRITIN, TIBC, IRON, RETICCTPCT in the last 72 hours. Urine analysis:    Component Value Date/Time   COLORURINE RED (A) 01/29/2021 1007   APPEARANCEUR CLOUDY (A) 01/29/2021 1007   LABSPEC 1.010 01/29/2021 1007   PHURINE 6.5 01/29/2021 1007   GLUCOSEU NEGATIVE 01/29/2021 1007   HGBUR LARGE (A) 01/29/2021 1007   BILIRUBINUR NEGATIVE 01/29/2021 1007   KETONESUR NEGATIVE 01/29/2021 1007   PROTEINUR 100 (A) 01/29/2021 1007   UROBILINOGEN 0.2 03/27/2010 2223   NITRITE NEGATIVE 01/29/2021 1007   LEUKOCYTESUR TRACE (A) 01/29/2021 1007   Sepsis Labs: @LABRCNTIP (procalcitonin:4,lacticidven:4)  ) Recent Results (from the past 240 hour(s))  Culture, Urine     Status: None   Collection Time: 01/29/21 10:48 AM   Specimen: Urine, Random  Result Value Ref Range Status   Specimen Description URINE, RANDOM  Final   Special Requests NONE  Final   Culture   Final    NO GROWTH Performed at [facility], [place] [address], [place], [place] [address]    Report Status 01/30/2021 FINAL  Final      Radiology Studies: US RENAL  Result Date: 01/29/2021 CLINICAL DATA:  87 year old with urinary retention. EXAM: RENAL / URINARY TRACT ULTRASOUND COMPLETE COMPARISON:  None. FINDINGS: Right Kidney: Renal measurements: 9.5 x 4.8 x 4.1 cm = volume: 97 mL. Increased renal parenchymal echogenicity with thinning of the renal cortex. There is a 1.1 cm simple cyst in the upper kidney. No hydronephrosis. No solid lesion. No evidence of stone. Left Kidney: Renal measurements: 10.0 x 4.4 x 3.5 cm = volume: 80 mL. Technically limited evaluation due to shadowing bowel gas. There is increased renal parenchymal echogenicity and mild thinning of the  renal cortex. No hydronephrosis. No visualized focal lesion or stone. Bladder: Partially distended. No evidence of bladder wall thickening. There may be minimal layering debris. Ureteral jets are not seen. Other: None. IMPRESSION: 1. Partially distended urinary bladder with questionable bladder debris. No definite wall thickening. 2. Thinning of the renal parenchyma and increased echogenicity consistent with chronic medical renal disease. Electronically Signed   By: Keith Rake M.D.   On: 01/29/2021 15:59     Scheduled Meds:  cephALEXin  500 mg Oral Q12H   cholecalciferol  1,000 Units Oral Daily   furosemide  40 mg Intravenous BID   levothyroxine  88 mcg Oral Q0600   pantoprazole  40 mg Oral Daily   sertraline  25 mg Oral BID  simvastatin  10 mg Oral Daily   warfarin  1 mg Oral Once per day on Mon Wed Fri   warfarin  1.5 mg Oral Once per day on Sun Tue Thu Sat   Warfarin - Pharmacist Dosing Inpatient   Does not apply q1600   Continuous Infusions:   LOS: 3 days   Time Spent in minutes   45 minutes  Tanis Burnley D.O. on 01/30/2021 at 1:36 PM  Between 7am to 7pm - Please see pager noted on [email or web address]  After 7pm go to [email or web address]  And look for the night coverage person covering for me after hours  [facility] Office  [phone]

## 2021-01-31 ENCOUNTER — Telehealth: Payer: Self-pay | Admitting: Cardiovascular Disease

## 2021-01-31 DIAGNOSIS — E871 Hypo-osmolality and hyponatremia: Secondary | ICD-10-CM

## 2021-01-31 LAB — CBC
HCT: 32.1 % — ABNORMAL LOW (ref 39.0–52.0)
Hemoglobin: 10.4 g/dL — ABNORMAL LOW (ref 13.0–17.0)
MCH: 30.1 pg (ref 26.0–34.0)
MCHC: 32.4 g/dL (ref 30.0–36.0)
MCV: 93 fL (ref 80.0–100.0)
Platelets: 157 10*3/uL (ref 150–400)
RBC: 3.45 MIL/uL — ABNORMAL LOW (ref 4.22–5.81)
RDW: 15.1 % (ref 11.5–15.5)
WBC: 8.3 10*3/uL (ref 4.0–10.5)
nRBC: 0 % (ref 0.0–0.2)

## 2021-01-31 LAB — BASIC METABOLIC PANEL
Anion gap: 7 (ref 5–15)
BUN: 44 mg/dL — ABNORMAL HIGH (ref 8–23)
CO2: 32 mmol/L (ref 22–32)
Calcium: 8.4 mg/dL — ABNORMAL LOW (ref 8.9–10.3)
Chloride: 92 mmol/L — ABNORMAL LOW (ref 98–111)
Creatinine, Ser: 2.01 mg/dL — ABNORMAL HIGH (ref 0.61–1.24)
GFR, Estimated: 29 mL/min — ABNORMAL LOW (ref >60)
Glucose, Bld: 105 mg/dL — ABNORMAL HIGH (ref 70–99)
Potassium: 3.7 mmol/L (ref 3.5–5.1)
Sodium: 131 mmol/L — ABNORMAL LOW (ref 135–145)

## 2021-01-31 LAB — PROTIME-INR
INR: 2.3 — ABNORMAL HIGH (ref 0.8–1.2)
Prothrombin Time: 25.1 seconds — ABNORMAL HIGH (ref 11.4–15.2)

## 2021-01-31 MED ORDER — FUROSEMIDE 80 MG PO TABS
80.0000 mg | ORAL_TABLET | Freq: Every day | ORAL | Status: DC
  Administered 2021-01-31 – 2021-02-01 (×2): 80 mg via ORAL
  Filled 2021-01-31 (×2): qty 1

## 2021-01-31 MED ORDER — BISACODYL 5 MG PO TBEC
5.0000 mg | DELAYED_RELEASE_TABLET | Freq: Every day | ORAL | Status: DC | PRN
  Administered 2021-01-31: 5 mg via ORAL
  Filled 2021-01-31 (×2): qty 1

## 2021-01-31 NOTE — Telephone Encounter (Signed)
Called daughter [name] back (OK per DPR).  I informed her to call back once her father is released from the hospital to schedule fu appointment with cardiology.

## 2021-01-31 NOTE — Progress Notes (Signed)
Physical Therapy Treatment Patient Details Name: [name] MRN: [id number] DOB: [birth year] Today's Date: 01/31/2021    History of Present Illness Pt is a 87 y/o male admitted from [facility] on 01/27/21 due to worsening BLE edema. Workup for volume overload due to acute on chronic systolic CHF superimposed by worsening renal function. PMH includes CHF, afib on Coumadin, pulmonary fibrosis, CKD 3B, HTN, MI, PNA, PVD.   PT Comments    Pt progressing well with mobility. Today's session focused on transfer and gait training; pt requires minA to stand from low bed height, ambulating with rollator at supervision-level. Pt demonstrates good awareness of activity pacing; requires intermittent standing rest breaks with hallway ambulation. Pt remains motivated to participate and hopeful for d/c home soon. Recommend follow-up with HHPT services at ALF to maximize functional mobility and independence.    Follow Up Recommendations  Home health PT;Supervision - Intermittent     Equipment Recommendations  None recommended by PT    Recommendations for Other Services       Precautions / Restrictions Precautions Precautions: Fall Restrictions Weight Bearing Restrictions: No    Mobility  Bed Mobility               General bed mobility comments: Received sitting at EOB    Transfers Overall transfer level: Needs assistance Equipment used: 4-wheeled walker Transfers: Sit to/from Stand Sit to Stand: Min assist         General transfer comment: MinA for trunk elevation standing from low bed height, pt with good awareness to lock rollator brakes; reports bed height taller at home  Ambulation/Gait Ambulation/Gait assistance: Supervision Gait Distance (Feet): 260 Feet Assistive device: 4-wheeled walker Gait Pattern/deviations: Step-through pattern;Decreased stride length;Trunk flexed Gait velocity: Decreased   General Gait Details: Slow, steady gait with rollator and  supervision due to fall risk; 3x standing rest breaks secondary to SOB (pt attributes to wearing face mask)   Stairs             Wheelchair Mobility    Modified Rankin (Stroke Patients Only)       Balance Overall balance assessment: Needs assistance Sitting-balance support: No upper extremity supported;Feet supported Sitting balance-Leahy Scale: Good     Standing balance support: Bilateral upper extremity supported;During functional activity Standing balance-Leahy Scale: Fair Standing balance comment: Can static stand without UE support; static and dynamic stability improved with rollator                            Cognition Arousal/Alertness: Awake/alert Behavior During Therapy: WFL for tasks assessed/performed Overall Cognitive Status: Within Functional Limits for tasks assessed                                 General Comments: age related decreased STM, otherwise [facility]      Exercises      General Comments General comments (skin integrity, edema, etc.): SpO2 >/92% on RA      Pertinent Vitals/Pain Pain Assessment: No/denies pain    Home Living                      Prior Function            PT Goals (current goals can now be found in the care plan section) Progress towards PT goals: Progressing toward goals    Frequency    Min 3X/week  PT Plan Current plan remains appropriate    Co-evaluation              AM-PAC PT "6 Clicks" Mobility   Outcome Measure  Help needed turning from your back to your side while in a flat bed without using bedrails?: None Help needed moving from lying on your back to sitting on the side of a flat bed without using bedrails?: A Little Help needed moving to and from a bed to a chair (including a wheelchair)?: A Little Help needed standing up from a chair using your arms (e.g., wheelchair or bedside chair)?: A Little Help needed to walk in hospital room?: A Little Help  needed climbing 3-5 steps with a railing? : A Lot 6 Click Score: 18    End of Session Equipment Utilized During Treatment: Gait belt Activity Tolerance: Patient tolerated treatment well Patient left: in chair;with call bell/phone within reach;with chair alarm set Nurse Communication: Mobility status PT Visit Diagnosis: Other abnormalities of gait and mobility (R26.89);Muscle weakness (generalized) (M62.81)     Time: 6301-6010 PT Time Calculation (min) (ACUTE ONLY): 21 min  Charges:  $Gait Training: 8-22 mins                     Mabeline Caras, PT, DPT Acute Rehabilitation Services  Pager [phone] Office [place] 01/31/2021, 12:59 PM

## 2021-01-31 NOTE — Progress Notes (Signed)
PROGRESS NOTE    [name]  YQI:[id number] DOB: 09-07-1919 DOA: 01/27/2021 PCP: [name], [name], NP   Brief Narrative:  HPI on 01/27/2021 by Dr. Ian Malkin is a 87 y.o. male with medical history significant for chronic systolic CHF, permanent atrial fibrillation on Coumadin, pulmonary fibrosis, GERD, chronic constipation, hypothyroidism, anemia of chronic disease, hyperlipidemia, CKD 3B, who presented to [facility] ED from friends home due to worsening bilateral lower extremity edema up to his groins.  Associated with orthopnea.  Admits to intermittent nonproductive cough which is chronic.  He denies any chest pain, dyspnea or palpitations.  Denies any constitutional symptoms.  Denies excessive use of salt in his diet.  Has been compliant with his medications including diuretics, p.o. Lasix 80 mg daily.   Was brought into the ED for further evaluation.  While in the ED he was found to have 3+ pitting edema all the way up to his perineal region with concern for volume overload.  Received 1 dose of IV Lasix 80 mg x 1.  TRH, hospitalist team, was asked to admit.    Interim history Patient admitted with anasarca/volume overload with CHF exacerbation and placed on IV Lasix.  Patient did have elevated troponin which was likely demand ischemia.  This morning however, patient did develop urethral blood clots.  Discussed with urology, and recommended outpatient follow-up if no issues with urination. Assessment & Plan   Acute systolic CHF exacerbation with volume overload and anasarca -Possibly secondary to medication or dietary noncompliance -Patient did present with shortness of breath and notably physically appear to be volume overloaded -Chest x-ray unremarkable for infection however upon review of the chest x-ray, does appear to have vascular congestion -BNP on admission 1003.6 -Was placed on IV Lasix, 40 mg IV twice daily- will transition to lasix 80mg  daily -Echocardiogram 01/01/2021  showed an EF of 40 to 45%, severely dilated left atrial size, moderately dilated right atrial size.  Tricuspid valve regurgitation mild to moderate. -Continue to monitor daily weights, intake and output  Acute kidney injury on chronic kidney disease, stage IIIb -Baseline creatinine appears to be 1.9 -GFR on admission 2.15, GFR 27 -Creatinine today 2.01 -Continue to monitor carefully given diuresis   Urethral bleeding/hematuria -Patient with noted frank hematuria and blood clots from the urethra today -Discussed with Dr. Matilde Sprang, urology, recommended obtaining UA and urine culture, placing patient on an antibiotic, with outpatient follow-up.  If patient has difficulty with urination, will formally consult. -Patient currently denies any difficulty with urination or hesitancy or pain -Per patient has resolved.  -UA unremarkable for infection, but started on keflex by urology  -Urine culture shows no growth  -Patient is on Coumadin, will monitor H&H closely- hemoglobin stable 10.4  Elevated troponin -Suspect demand ischemia in the setting of CHF exacerbation and chronic kidney disease -Troponin elevation flat, 130->140 -Currently denies any chest pain, diuresis, nausea, neck or arm pain -EKG without ST changes  Hypervolemic hyponatremia -Likely secondary to diuresis, currently 131 -will continue to monitor BMP  Chronic normocytic anemia -Hemoglobin currently 10.2, baseline hemoglobin approximately 10-11 -Will continue to monitor H&H closely  Permanent atrial fibrillation -Currently on Coumadin, INR appears to be at goal.  Pharmacy dosing  Chronic anxiety/depression -Continue Zoloft, Valium as needed  Pulmonary fibrosis -Chest x-ray on admission noted bilateral pulmonary infiltrates, similar to previous -Currently patient on room air and maintaining oxygen saturations in the 90s  Hypothyroidism -TSH 5.933 on 01/03/2021 -Continue home regimen  Hyperlipidemia -Continue  statin  GERD -Continue PPI  Ambulatory dysfunction with history of fall -Patient fell several weeks ago and noted to have wound on head -PT and OT recommended HH -Continue fall precautions  DVT Prophylaxis  Coumadin  Code Status: DNR  Family Communication: None at bedside  Disposition Plan:  Status is: Inpatient  Remains inpatient appropriate because:Persistent severe electrolyte disturbances, IV treatments appropriate due to intensity of illness or inability to take PO, and Inpatient level of care appropriate due to severity of illness  Dispo:  Patient From: [place]  Planned Disposition: [place]  Medically stable for discharge: No     Consultants Urology, Dr. Matilde Sprang  Procedures  None  Antibiotics   Anti-infectives (From admission, onward)    Start     Dose/Rate Route Frequency Ordered Stop   01/30/21 2200  cephALEXin (KEFLEX) capsule 500 mg        500 mg Oral Every 12 hours 01/30/21 0802     01/29/21 2000  cephALEXin (KEFLEX) capsule 500 mg  Status:  Discontinued        500 mg Oral Every 8 hours 01/29/21 1742 01/30/21 0802       Subjective:   [name] seen and examined today.  Feels leg swelling has improved. Denies current shortness of breath, chest pain, abdominal pain, nausea or vomiting, diarrhea, dizziness or headache.  Complains of constipation.  Denies further blood clots while urinating.  Feels his penile swelling has improved.   Objective:   Vitals:   01/31/21 0400 01/31/21 0448 01/31/21 0500 01/31/21 1147  BP:  121/77  111/72  Pulse:  90  100  Resp: 20 19 16 17   Temp:  97.7 F (36.5 C)  98 F (36.7 C)  TempSrc:  Oral  Oral  SpO2: 93% 93% 93% 93%  Weight:      Height:        Intake/Output Summary (Last 24 hours) at 01/31/2021 1449 Last data filed at 01/31/2021 1340 Gross per 24 hour  Intake 240 ml  Output 1470 ml  Net -1230 ml    Filed Weights   01/29/21 0352 01/30/21 0341 01/31/21 0300   Weight: 71.8 kg 71.8 kg 71.7 kg   Exam General: Well developed, well nourished, elderly, NAD HEENT: NCAT, mucous membranes moist.  Cardiovascular: S1 S2 auscultated, irregular Respiratory: Diminished breath sounds but clear, no wheezing Abdomen: Soft, nontender, nondistended, + bowel sounds Extremities: warm dry without cyanosis clubbing. + LE edema bilaterally Neuro: AAOx3, nonfocal Skin: Bruising noted on patient's left lower back and side Psych: appropriate mood and affect, pleasant   Data Reviewed: I have personally reviewed following labs and imaging studies  CBC: Recent Labs  Lab 01/27/21 2218 01/28/21 0436 01/29/21 0300 01/30/21 0312 01/31/21 0315  WBC 8.3 8.9 6.5 7.5 8.3  HGB 11.7* 11.1* 9.9* 10.2* 10.4*  HCT 36.7* 34.8* 30.7* 31.4* 32.1*  MCV 94.8 95.1 93.0 92.6 93.0  PLT 168 175 148* 141* 130    Basic Metabolic Panel: Recent Labs  Lab 01/27/21 2218 01/28/21 0436 01/29/21 0300 01/30/21 0312 01/31/21 0315  NA 128* 131* 131* 132* 131*  K 4.3 3.9 3.7 3.7 3.7  CL 94* 91* 93* 93* 92*  CO2 24 28 28 30  32  GLUCOSE 124* 110* 105* 108* 105*  BUN 50* 50* 48* 44* 44*  CREATININE 2.15* 2.07* 1.99* 2.07* 2.01*  CALCIUM 8.7* 8.8* 8.5* 8.7* 8.4*  MG 2.9* 2.6*  --   --   --   PHOS  --  3.6  --   --   --  GFR: Estimated Creatinine Clearance: 18.6 mL/min (A) (by C-G formula based on SCr of 2.01 mg/dL (H)). Liver Function Tests: Recent Labs  Lab 01/27/21 2218 01/28/21 0436  AST 24  --   ALT 14  --   ALKPHOS 159*  --   BILITOT 0.8  --   PROT 6.7  --   ALBUMIN 3.4* 3.3*    No results for input(s): LIPASE, AMYLASE in the last 168 hours. No results for input(s): AMMONIA in the last 168 hours. Coagulation Profile: Recent Labs  Lab 01/27/21 2218 01/28/21 0436 01/29/21 0300 01/30/21 0312 01/31/21 0315  INR 2.3* 2.4* 2.5* 2.4* 2.3*    Cardiac Enzymes: No results for input(s): CKTOTAL, CKMB, CKMBINDEX, TROPONINI in the last 168 hours. BNP (last 3  results) No results for input(s): PROBNP in the last 8760 hours. HbA1C: No results for input(s): HGBA1C in the last 72 hours. CBG: No results for input(s): GLUCAP in the last 168 hours. Lipid Profile: No results for input(s): CHOL, HDL, LDLCALC, TRIG, CHOLHDL, LDLDIRECT in the last 72 hours. Thyroid Function Tests: No results for input(s): TSH, T4TOTAL, FREET4, T3FREE, THYROIDAB in the last 72 hours. Anemia Panel: No results for input(s): VITAMINB12, FOLATE, FERRITIN, TIBC, IRON, RETICCTPCT in the last 72 hours. Urine analysis:    Component Value Date/Time   COLORURINE RED (A) 01/29/2021 1007   APPEARANCEUR CLOUDY (A) 01/29/2021 1007   LABSPEC 1.010 01/29/2021 1007   PHURINE 6.5 01/29/2021 1007   GLUCOSEU NEGATIVE 01/29/2021 1007   HGBUR LARGE (A) 01/29/2021 1007   BILIRUBINUR NEGATIVE 01/29/2021 1007   KETONESUR NEGATIVE 01/29/2021 1007   PROTEINUR 100 (A) 01/29/2021 1007   UROBILINOGEN 0.2 03/27/2010 2223   NITRITE NEGATIVE 01/29/2021 1007   LEUKOCYTESUR TRACE (A) 01/29/2021 1007   Sepsis Labs: @LABRCNTIP (procalcitonin:4,lacticidven:4)  ) Recent Results (from the past 240 hour(s))  Culture, Urine     Status: None   Collection Time: 01/29/21 10:48 AM   Specimen: Urine, Random  Result Value Ref Range Status   Specimen Description URINE, RANDOM  Final   Special Requests NONE  Final   Culture   Final    NO GROWTH Performed at [facility], [place] [address], [place], [place] [address]    Report Status 01/30/2021 FINAL  Final       Radiology Studies: No results found.   Scheduled Meds:  cephALEXin  500 mg Oral Q12H   cholecalciferol  1,000 Units Oral Daily   furosemide  80 mg Oral Daily   levothyroxine  88 mcg Oral Q0600   pantoprazole  40 mg Oral Daily   senna-docusate  1 tablet Oral QHS   sertraline  25 mg Oral BID   simvastatin  10 mg Oral Daily   warfarin  1 mg Oral Once per day on Mon Wed Fri   warfarin  1.5 mg Oral Once per day on Sun Tue Thu Sat    Warfarin - Pharmacist Dosing Inpatient   Does not apply q1600   Continuous Infusions:   LOS: 4 days   Time Spent in minutes   45 minutes  Vong Garringer D.O. on 01/31/2021 at 2:49 PM  Between 7am to 7pm - Please see pager noted on [email or web address]  After 7pm go to [email or web address]  And look for the night coverage person covering for me after hours  [facility] Office  [phone]

## 2021-01-31 NOTE — Progress Notes (Signed)
[place] for Warfarin  Indication: atrial fibrillation  Allergies  Allergen Reactions   Ambien [Zolpidem] Other (See Comments)    unknown   Clindamycin/Lincomycin Other (See Comments)    unknown   Penicillins Rash    Vital Signs: Temp: 97.7 F (36.5 C) (06/24 0448) Temp Source: Oral (06/24 0448) BP: 121/77 (06/24 0448) Pulse Rate: 90 (06/24 0448)  Labs: Recent Labs    01/29/21 0300 01/30/21 0312 01/31/21 0315  HGB 9.9* 10.2* 10.4*  HCT 30.7* 31.4* 32.1*  PLT 148* 141* 157  LABPROT 27.0* 26.0* 25.1*  INR 2.5* 2.4* 2.3*  CREATININE 1.99* 2.07* 2.01*    Estimated Creatinine Clearance: 18.6 mL/min (A) (by C-G formula based on SCr of 2.01 mg/dL (H)).  Assessment: 87 y/o M presents to the ED with volume overload, on warfarin PTA for afib, INR is therapeutic at 2.3 on admission.  Warfarin PTA dosing: 1 mg Mon/Wed/Fri, 1.5 mg Tues/Thurs/Sat/Sun  INR remains therapeutic at 2.3. Hgb 10.4, plt 157. No further hematuria since yesterday - continue to monitor.   Goal of Therapy:  INR 2-3 Monitor platelets by anticoagulation protocol: Yes   Plan:  Cont warfarin PTA dosing - due for 1 mg tonight  Daily PT/INR  Thank you Antonietta Jewel, PharmD, BCCCP Clinical Pharmacist  Phone: [phone] 01/31/2021 7:55 AM  Please check AMION for all [place] phone numbers After 10:00 PM, call [place] [phone]

## 2021-01-31 NOTE — Telephone Encounter (Signed)
     Pt's daughter calling, she canceled appt on Monday because pt is in the hospital and wasn't sure when will be sent home,  she wanted to know if the pt needs to r/s appt, if pt need to f/u with heart doctor

## 2021-02-01 DIAGNOSIS — N1832 Chronic kidney disease, stage 3b: Secondary | ICD-10-CM

## 2021-02-01 DIAGNOSIS — N179 Acute kidney failure, unspecified: Secondary | ICD-10-CM

## 2021-02-01 LAB — BASIC METABOLIC PANEL
Anion gap: 8 (ref 5–15)
BUN: 43 mg/dL — ABNORMAL HIGH (ref 8–23)
CO2: 31 mmol/L (ref 22–32)
Calcium: 8.7 mg/dL — ABNORMAL LOW (ref 8.9–10.3)
Chloride: 92 mmol/L — ABNORMAL LOW (ref 98–111)
Creatinine, Ser: 1.97 mg/dL — ABNORMAL HIGH (ref 0.61–1.24)
GFR, Estimated: 30 mL/min — ABNORMAL LOW (ref >60)
Glucose, Bld: 112 mg/dL — ABNORMAL HIGH (ref 70–99)
Potassium: 3.6 mmol/L (ref 3.5–5.1)
Sodium: 131 mmol/L — ABNORMAL LOW (ref 135–145)

## 2021-02-01 LAB — SARS CORONAVIRUS 2 (TAT 6-24 HRS): SARS Coronavirus 2: NEGATIVE

## 2021-02-01 LAB — PROTIME-INR
INR: 2.5 — ABNORMAL HIGH (ref 0.8–1.2)
Prothrombin Time: 26.9 seconds — ABNORMAL HIGH (ref 11.4–15.2)

## 2021-02-01 LAB — RESP PANEL BY RT-PCR (FLU A&B, COVID) ARPGX2
Influenza A by PCR: NEGATIVE
Influenza B by PCR: NEGATIVE
SARS Coronavirus 2 by RT PCR: NEGATIVE

## 2021-02-01 MED ORDER — BISACODYL 10 MG RE SUPP
10.0000 mg | Freq: Once | RECTAL | Status: AC
  Administered 2021-02-01: 10 mg via RECTAL
  Filled 2021-02-01: qty 1

## 2021-02-01 MED ORDER — CEPHALEXIN 500 MG PO CAPS: 500.0000 mg | ORAL_CAPSULE | Freq: Two times a day (BID) | ORAL | 0 refills | Status: AC

## 2021-02-01 NOTE — Plan of Care (Signed)
  Problem: Education: Goal: Knowledge of General Education information will improve Description Including pain rating scale, medication(s)/side effects and non-pharmacologic comfort measures Outcome: Progressing   Problem: Health Behavior/Discharge Planning: Goal: Ability to manage health-related needs will improve Outcome: Progressing   

## 2021-02-01 NOTE — NC FL2 (Signed)
[place] LEVEL OF CARE SCREENING TOOL     IDENTIFICATION  Patient Name: [name] Birthdate: 13-May-1920 Sex: male Admission Date (Current Location): 01/27/2021  [facility] and [place] Number:      Facility and Address:         Provider Number:    Attending Physician Name and Address:  Cristal Ford, DO  Relative Name and Phone Number:       Current Level of Care:   Recommended Level of Care:   Prior Approval Number:    Date Approved/Denied:   PASRR Number:    Discharge Plan:      Current Diagnoses: Patient Active Problem List   Diagnosis Date Noted   Urinary retention    Hypervolemia due to congestive heart failure ([place]) 01/27/2021   Hyponatremia 01/09/2021   Acute CHF (congestive heart failure) ([place]) 12/31/2020   Syncope and collapse 12/31/2020   Acute respiratory failure with hypoxia ([place]) 12/31/2020   Acute-on-chronic kidney injury ([place]) 12/31/2020   Scalp laceration 12/31/2020   Unspecified atrial fibrillation ([place]) 12/31/2020   HLD (hyperlipidemia) 12/31/2020   Hypothyroidism 12/31/2020   Long term (current) use of anticoagulants 04/18/2020   Slow transit constipation 03/12/2020   Chronic disease anemia 03/12/2020   Generalized weakness 03/12/2020   History of GI bleed 11/29/2017   Gastroesophageal reflux disease 11/29/2017   GAD (generalized anxiety disorder) 11/29/2017   Hypothyroidism 11/29/2017   Chronic kidney disease 06/08/2017   Encounter for power mobility device assessment 04/15/2017   CAD (coronary artery disease) 11/06/2014   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   Tooth loose 12/26/2013   Peripheral edema 11/22/2012   Chronic diastolic heart failure ([place]) 07/13/2011   Healthcare-associated pneumonia 07/03/2011   Foot pain 07/03/2011   GIB (gastrointestinal bleeding) 07/01/2011   Hypotension (arterial) 07/01/2011   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/01/2011   ASCVD (arteriosclerotic  cardiovascular disease) 07/01/2011   ARF (acute renal failure) ([place]) 07/01/2011   Atrial fibrillation ([place]) 11/10/2010    Orientation RESPIRATION BLADDER Height & Weight        Normal Continent Weight: 157 lb 6.4 oz (71.4 kg) Height:  5\' 4"  (162.6 cm)  BEHAVIORAL SYMPTOMS/MOOD NEUROLOGICAL BOWEL NUTRITION STATUS        Diet (Heart Healthy)  AMBULATORY STATUS COMMUNICATION OF NEEDS Skin                               Personal Care Assistance Level of Assistance              Functional Limitations Info             SPECIAL CARE FACTORS FREQUENCY                       Contractures      Additional Factors Info      Allergies Info: Ambien (Zolpidem)   Clindamycin/lincomycin   Penicillins           Discharge Medications: acetaminophen 325 MG tablet Commonly known as: TYLENOL Take 650 mg by mouth 2 (two) times daily.    cephALEXin 500 MG capsule Commonly known as: KEFLEX Take 1 capsule (500 mg total) by mouth every 12 (twelve) hours for 4 days.    clotrimazole 1 % cream Commonly known as: LOTRIMIN Apply 1 application topically 2 (two) times daily.    diazepam 5 MG tablet Commonly known as: VALIUM Take 0.5 tablets (2.5 mg total)  by mouth every 6 (six) hours as needed for anxiety.    furosemide 80 MG tablet Commonly known as: LASIX Take 1 tablet (80 mg total) by mouth daily. What changed: Another medication with the same name was removed. Continue taking this medication, and follow the directions you see here.    levothyroxine 88 MCG tablet Commonly known as: SYNTHROID Take 88 mcg by mouth daily before breakfast.    loratadine 10 MG tablet Commonly known as: CLARITIN Take 10 mg by mouth daily.    magnesium hydroxide 400 MG/5ML suspension Commonly known as: MILK OF MAGNESIA Take 30 mLs by mouth daily as needed for mild constipation.    metoprolol succinate 25 MG 24 hr tablet Commonly known as: TOPROL-XL Take 25 mg by mouth daily.     omeprazole 20 MG capsule Commonly known as: PRILOSEC Take 20 mg by mouth daily.    sertraline 25 MG tablet Commonly known as: ZOLOFT Take 25 mg by mouth 2 (two) times daily.    simvastatin 10 MG tablet Commonly known as: ZOCOR Take 10 mg by mouth daily.    Vitamin D3 25 MCG tablet Commonly known as: Vitamin D Take 1,000 Units by mouth daily.    warfarin 1 MG tablet Commonly known as: COUMADIN Take 1-1.5 mg by mouth See admin instructions. Take 1mg  every MWF, and 1.5mg  on the other days.     Relevant Imaging Results:  Relevant Lab Results:   Additional Information    Charlotte Brafford B Jahn Franchini, LCSWA

## 2021-02-01 NOTE — Progress Notes (Signed)
[place] [facility] for Warfarin  Indication: atrial fibrillation  Allergies  Allergen Reactions   Ambien [Zolpidem] Other (See Comments)    unknown   Clindamycin/Lincomycin Other (See Comments)    unknown   Penicillins Rash    Vital Signs: Temp: 98 F (36.7 C) (06/25 0400) Temp Source: Oral (06/25 0400) BP: 127/81 (06/25 0400) Pulse Rate: 84 (06/25 0400)  Labs: Recent Labs    01/30/21 0312 01/31/21 0315 02/01/21 0310  HGB 10.2* 10.4*  --   HCT 31.4* 32.1*  --   PLT 141* 157  --   LABPROT 26.0* 25.1* 26.9*  INR 2.4* 2.3* 2.5*  CREATININE 2.07* 2.01* 1.97*    Estimated Creatinine Clearance: 19 mL/min (A) (by C-G formula based on SCr of 1.97 mg/dL (H)).  Assessment: 87 y/o M presents to the ED with volume overload, on warfarin PTA for afib, INR is therapeutic at 2.3 on admission.  Warfarin PTA dosing: 1 mg Mon/Wed/Fri, 1.5 mg Tues/Thurs/Sat/Sun  INR remains therapeutic at 2.5. Hgb 10.4, plt 157. No further hematuria since yesterday - continue to monitor.   Goal of Therapy:  INR 2-3 Monitor platelets by anticoagulation protocol: Yes   Plan:  Cont warfarin PTA dosing - due for 1.5 mg tonight  Daily PT/INR  Thank you [employer], Pharm.D., BCPS Clinical Pharmacist Clinical phone for 02/01/2021 from 7:30-3:00 is [phone].  **Pharmacist phone directory can be found on [email or web address] listed under [place].  02/01/2021 10:04 AM

## 2021-02-01 NOTE — TOC Transition Note (Signed)
Transition of Care [facility] [place]) - CM/SW Discharge Note   Patient Details  Name: [name] MRN: [id number] Date of Birth: [birth year]  Transition of [facility] CM/SW Contact:  L[address]ta [address], [place] Phone Number: 02/01/2021, 1:47 PM   Clinical Narrative:    Patient will DC to: [place] date: 02/01/21 Family notified: Crecencio Mc (daughter) Transport by: Crecencio Mc   Per MD patient ready for DC to . RN to call report prior to discharge [phone] ext [phone], ask for Kim. RN, patient, patient's family, and facility notified of DC. Discharge Summary and FL2 sent to facility. DC packet on chart. [name] (pt's daughter) will transport pt.   CSW will sign off for now as social work intervention is no longer needed. Please consult [place] again if new needs arise.           Patient Goals and CMS Choice        Discharge Placement                       Discharge Plan and Services                                     Social Determinants of Health (SDOH) Interventions     Readmission Risk Interventions No flowsheet data found.

## 2021-02-01 NOTE — Progress Notes (Signed)
Pt d/c back to SNF, daughter to transport. Report given to [employer]. Covid result negative, copy placed in d/c packet.

## 2021-02-01 NOTE — Discharge Summary (Signed)
Physician Discharge Summary  [name]:[id number] DOB: 08-Jul-1920 DOA: 01/27/2021  PCP: [name], [name], NP  Admit date: 01/27/2021 Discharge date: 02/01/2021  Time spent: 45 minutes  Recommendations for Outpatient Follow-up:  Patient will be [facility].  Patient will need to follow up with primary care provider within one week of discharge, repeat BMP.  Patient should continue medications as prescribed.  Patient should follow a heart healthy diet.    Discharge Diagnoses:  Acute systolic CHF exacerbation with volume overload and anasarca Acute kidney injury on chronic kidney disease, stage IIIb Urethral bleeding/hematuria Elevated troponin Hypervolemic hyponatremia Chronic normocytic anemia Permanent atrial fibrillation Chronic anxiety/depression Pulmonary fibrosis Hypothyroidism Hyperlipidemia GERD Ambulatory dysfunction with history of fall  Discharge Condition: Stable  Diet recommendation: Heart healthy  Filed Weights   01/30/21 0341 01/31/21 0300 02/01/21 0029  Weight: 71.8 kg 71.7 kg 71.4 kg    History of present illness:  on 01/27/2021 by Dr. Ian Malkin is a 87 y.o. male with medical history significant for chronic systolic CHF, permanent atrial fibrillation on Coumadin, pulmonary fibrosis, GERD, chronic constipation, hypothyroidism, anemia of chronic disease, hyperlipidemia, CKD 3B, who [facility] due to worsening bilateral lower extremity edema up to his groins.  Associated with orthopnea.  Admits to intermittent nonproductive cough which is chronic.  He denies any chest pain, dyspnea or palpitations.  Denies any constitutional symptoms.  Denies excessive use of salt in his diet.  Has been compliant with his medications including diuretics, p.o. Lasix 80 mg daily.   Was brought into the ED for further evaluation.  While in the ED he was found to  have 3+ pitting edema all the way up to his perineal region with concern for volume overload.  Received 1 dose of IV Lasix 80 mg x 1.  TRH, hospitalist team, was asked to admit.  Hospital Course:  Acute systolic CHF exacerbation with volume overload and anasarca -Possibly secondary to medication or dietary noncompliance -Patient did present with shortness of breath and notably physically appear to be volume overloaded -Chest x-ray unremarkable for infection however upon review of the chest x-ray, does appear to have vascular congestion -BNP on admission 1003.6 -Was placed on IV Lasix, 40 mg IV twice daily- transitioned to lasix 80mg  daily -Echocardiogram 01/01/2021 showed an EF of 40 to 45%, severely dilated left atrial size, moderately dilated right atrial size.  Tricuspid valve regurgitation mild to moderate. -Continue to monitor daily weights, intake and output   Acute kidney injury on chronic kidney disease, stage IIIb -Baseline creatinine appears to be 1.9 -GFR on admission 2.15, GFR 27 -Creatinine today 1.97 -Repeat BMP in one week   Urethral bleeding/hematuria -Patient with noted frank hematuria and blood clots from the urethra today -Discussed with Dr. Matilde Sprang, urology, recommended obtaining UA and urine culture, placing patient on an antibiotic, with outpatient follow-up.  If patient has difficulty with urination, will formally consult. -Patient currently denies any difficulty with urination or hesitancy or pain -Per patient has resolved. -UA unremarkable for infection, but started on keflex by urology -Urine culture shows no growth -Patient is on Coumadin, will monitor H&H closely- hemoglobin stable 10.4   Elevated troponin -Suspect demand ischemia in the setting of CHF exacerbation and chronic kidney disease -Troponin elevation flat, 130->140 -Currently denies any chest pain, diuresis, nausea, neck or arm pain -EKG without ST changes   Hypervolemic hyponatremia -Likely  secondary  to diuresis, currently 131 -Repeat BMP in one week   Chronic normocytic anemia -Hemoglobin currently 10.4, baseline hemoglobin approximately 10-11   Permanent atrial fibrillation -Currently on Coumadin, INR appears to be at goal.  Pharmacy dosing   Chronic anxiety/depression -Continue Zoloft, Valium as needed   Pulmonary fibrosis -Chest x-ray on admission noted bilateral pulmonary infiltrates, similar to previous -Currently patient on room air and maintaining oxygen saturations in the 90s   Hypothyroidism -TSH 5.933 on 01/03/2021 -Continue home regimen   Hyperlipidemia -Continue statin   GERD -Continue PPI   Ambulatory dysfunction with history of fall -Patient fell several weeks ago and noted to have wound on head -PT and OT recommended HH -Continue fall precautions  Consultants Urology, Dr. Matilde Sprang   Procedures None    Discharge Exam: Vitals:   02/01/21 0400 02/01/21 1049  BP: 127/81 111/65  Pulse: 84 92  Resp: 18 (!) 21  Temp: 98 F (36.7 C) 97.8 F (36.6 C)  SpO2: 96% 97%    General: Well developed, well nourished, elderly, NAD, appears younger than stated age HEENT: NCAT, mucous membranes moist. Cardiovascular: S1 S2 auscultated, irregular Respiratory: Clear to auscultation bilaterally with equal chest rise Abdomen: Soft, nontender, nondistended, + bowel sounds Extremities: warm dry without cyanosis clubbing.  LE edema bilaterally Neuro: AAOx3, nonfocal Skin: Without rashes exudates or nodules, ecchymosis noted on left lower back and side as well as chest Psych: pleasant, appropriate mood and affect  Discharge Instructions Discharge Instructions     Diet - low sodium heart healthy   Complete by: As directed    Discharge instructions   Complete by: As directed    Patient will be [facility].  Patient will need to follow up with primary care provider within one  week of discharge, repeat BMP.  Patient should continue medications as prescribed.  Patient should follow a heart healthy diet.   Increase activity slowly   Complete by: As directed    No wound care   Complete by: As directed       Allergies as of 02/01/2021       Reactions   Ambien [zolpidem] Other (See Comments)   unknown   Clindamycin/lincomycin Other (See Comments)   unknown   Penicillins Rash        Medication List     TAKE these medications    acetaminophen 325 MG tablet Commonly known as: TYLENOL Take 650 mg by mouth 2 (two) times daily.   cephALEXin 500 MG capsule Commonly known as: KEFLEX Take 1 capsule (500 mg total) by mouth every 12 (twelve) hours for 4 days.   clotrimazole 1 % cream Commonly known as: LOTRIMIN Apply 1 application topically 2 (two) times daily.   diazepam 5 MG tablet Commonly known as: VALIUM Take 0.5 tablets (2.5 mg total) by mouth every 6 (six) hours as needed for anxiety.   furosemide 80 MG tablet Commonly known as: LASIX Take 1 tablet (80 mg total) by mouth daily. What changed: Another medication with the same name was removed. Continue taking this medication, and follow the directions you see here.   levothyroxine 88 MCG tablet Commonly known as: SYNTHROID Take 88 mcg by mouth daily before breakfast.   loratadine 10 MG tablet Commonly known as: CLARITIN Take 10 mg by mouth daily.   magnesium hydroxide 400 MG/5ML suspension Commonly known as: MILK OF MAGNESIA Take 30 mLs by mouth daily as needed for mild constipation.  metoprolol succinate 25 MG 24 hr tablet Commonly known as: TOPROL-XL Take 25 mg by mouth daily.   omeprazole 20 MG capsule Commonly known as: PRILOSEC Take 20 mg by mouth daily.   sertraline 25 MG tablet Commonly known as: ZOLOFT Take 25 mg by mouth 2 (two) times daily.   simvastatin 10 MG tablet Commonly known as: ZOCOR Take 10 mg by mouth daily.   Vitamin D3 25 MCG tablet Commonly known as:  Vitamin D Take 1,000 Units by mouth daily.   warfarin 1 MG tablet Commonly known as: COUMADIN Take 1-1.5 mg by mouth See admin instructions. Take 1mg  every MWF, and 1.5mg  on the other days.       Allergies  Allergen Reactions   Ambien [Zolpidem] Other (See Comments)    unknown   Clindamycin/Lincomycin Other (See Comments)    unknown   Penicillins Rash    Follow-up Information     [name], [name], NP. Schedule an appointment as soon as possible for a visit in 1 week(s).   Specialty: Internal Medicine Why: Hospital follow up Contact information: [address] [address] [phone]         Freada Bergeron, MD .   Specialties: Cardiology, Radiology Contact information: [phone] [address] [place] [address] [phone]                  The results of significant diagnostics from this hospitalization (including imaging, microbiology, ancillary and laboratory) are listed below for reference.    Significant Diagnostic Studies: US RENAL  Result Date: 01/29/2021 CLINICAL DATA:  87 year old with urinary retention. EXAM: RENAL / URINARY TRACT ULTRASOUND COMPLETE COMPARISON:  None. FINDINGS: Right Kidney: Renal measurements: 9.5 x 4.8 x 4.1 cm = volume: 97 mL. Increased renal parenchymal echogenicity with thinning of the renal cortex. There is a 1.1 cm simple cyst in the upper kidney. No hydronephrosis. No solid lesion. No evidence of stone. Left Kidney: Renal measurements: 10.0 x 4.4 x 3.5 cm = volume: 80 mL. Technically limited evaluation due to shadowing bowel gas. There is increased renal parenchymal echogenicity and mild thinning of the renal cortex. No hydronephrosis. No visualized focal lesion or stone. Bladder: Partially distended. No evidence of bladder wall thickening. There may be minimal layering debris. Ureteral jets are not seen. Other: None. IMPRESSION: 1. Partially distended urinary bladder with questionable bladder debris. No  definite wall thickening. 2. Thinning of the renal parenchyma and increased echogenicity consistent with chronic medical renal disease. Electronically Signed   By: Keith Rake M.D.   On: 01/29/2021 15:59   DG Chest Port 1 View  Result Date: 01/27/2021 CLINICAL DATA:  Shortness of breath EXAM: PORTABLE CHEST 1 VIEW COMPARISON:  12/31/2020, CT 03/07/2014 FINDINGS: Post sternotomy changes. Peripheral and basilar reticular and ground-glass opacity, likely due to pulmonary fibrosis. No acute consolidation or pleural effusion. Stable cardiomegaly. No pneumothorax. IMPRESSION: No active disease. Similar bilateral peripheral and basilar reticular and ground-glass opacity likely due to chronic fibro interstitial disease. Cardiomegaly. Electronically Signed   By: Donavan Foil M.D.   On: 01/27/2021 22:13    Microbiology: Recent Results (from the past 240 hour(s))  Culture, Urine     Status: None   Collection Time: 01/29/21 10:48 AM   Specimen: Urine, Random  Result Value Ref Range Status   Specimen Description URINE, RANDOM  Final   Special Requests NONE  Final   Culture   Final    NO GROWTH Performed at [facility]  [facility] Lab, [place] [address], [place], [place] [address]    Report Status 01/30/2021 FINAL  Final     Labs: Basic Metabolic Panel: Recent Labs  Lab 01/27/21 2218 01/28/21 0436 01/29/21 0300 01/30/21 0312 01/31/21 0315 02/01/21 0310  NA 128* 131* 131* 132* 131* 131*  K 4.3 3.9 3.7 3.7 3.7 3.6  CL 94* 91* 93* 93* 92* 92*  CO2 24 28 28 30  32 31  GLUCOSE 124* 110* 105* 108* 105* 112*  BUN 50* 50* 48* 44* 44* 43*  CREATININE 2.15* 2.07* 1.99* 2.07* 2.01* 1.97*  CALCIUM 8.7* 8.8* 8.5* 8.7* 8.4* 8.7*  MG 2.9* 2.6*  --   --   --   --   PHOS  --  3.6  --   --   --   --    Liver Function Tests: Recent Labs  Lab 01/27/21 2218 01/28/21 0436  AST 24  --   ALT 14  --   ALKPHOS 159*  --   BILITOT 0.8  --   PROT 6.7  --   ALBUMIN 3.4* 3.3*   No results for input(s): LIPASE,  AMYLASE in the last 168 hours. No results for input(s): AMMONIA in the last 168 hours. CBC: Recent Labs  Lab 01/27/21 2218 01/28/21 0436 01/29/21 0300 01/30/21 0312 01/31/21 0315  WBC 8.3 8.9 6.5 7.5 8.3  HGB 11.7* 11.1* 9.9* 10.2* 10.4*  HCT 36.7* 34.8* 30.7* 31.4* 32.1*  MCV 94.8 95.1 93.0 92.6 93.0  PLT 168 175 148* 141* 157   Cardiac Enzymes: No results for input(s): CKTOTAL, CKMB, CKMBINDEX, TROPONINI in the last 168 hours. BNP: BNP (last 3 results) Recent Labs    12/31/20 1646 01/27/21 2218  BNP 749.5* 1,003.6*    ProBNP (last 3 results) No results for input(s): PROBNP in the last 8760 hours.  CBG: No results for input(s): GLUCAP in the last 168 hours.     Signed:  Cristal Ford  [facility] 02/01/2021, 10:59 AM

## 2021-02-01 NOTE — Progress Notes (Signed)
Occupational Therapy Treatment Patient Details Name: [name] MRN: [id number] DOB: Jan 08, 1920 Today's Date: 02/01/2021    History of present illness Pt is a 87 y/o male admitted from [facility]21 due to worsening BLE edema. Workup for volume overload due to acute on chronic systolic CHF superimposed by worsening renal function. PMH includes CHF, afib on Coumadin, pulmonary fibrosis, CKD 3B, HTN, MI, PNA, PVD.   OT comments  Pt. Was cooperative during treatment session. Pt. States he is weak from being in the hospital. Pt. Increased with his sit to stand from bed and commode to min guard assist. Pt. Would benefit from continued OT in hospital and home health at [place].   Follow Up Recommendations  Home health OT;Supervision/Assistance - 24 hour    Equipment Recommendations  3 in 1 bedside commode    Recommendations for Other Services      Precautions / Restrictions Precautions Precautions: Fall Restrictions Weight Bearing Restrictions: No       Mobility Bed Mobility                    Transfers Overall transfer level: Needs assistance     Sit to Stand: Min guard (cues for proper hand placement) Stand pivot transfers: Min guard            Balance     Sitting balance-Leahy Scale: Good       Standing balance-Leahy Scale: Fair                             ADL either performed or assessed with clinical judgement   ADL Overall ADL's : Needs assistance/impaired     Grooming: Wash/dry hands;Standing           Upper Body Dressing : Supervision/safety;Sitting   Lower Body Dressing: Moderate assistance;Sit to/from stand   Toilet Transfer: Min guard;Ambulation;RW   Toileting- Water quality scientist and Hygiene: Min guard;Sit to/from stand       Functional mobility during ADLs: Min guard;Rolling walker General ADL Comments: Pt. states he does have assist at alf if needed.     Vision   Vision Assessment?: No apparent  visual deficits   Perception     Praxis      Cognition Arousal/Alertness: Awake/alert Behavior During Therapy: WFL for tasks assessed/performed Overall Cognitive Status: Within Functional Limits for tasks assessed                                 General Comments: age related decreased STM, otherwise [facility]        Exercises     Shoulder Instructions       General Comments      Pertinent Vitals/ Pain       Pain Assessment: 0-10 Pain Score: 2  Pain Location: stomach Pain Descriptors / Indicators: Aching Pain Intervention(s): Limited activity within patient's tolerance (Pt. states he is constipated and they are going to give him something for it.)  Home Living                                          Prior Functioning/Environment              Frequency  Min 2X/week        Progress Toward Goals  OT  Goals(current goals can now be found in the care plan section)  Progress towards OT goals: Progressing toward goals  Acute Rehab OT Goals Patient Stated Goal: Return home OT Goal Formulation: With patient Time For Goal Achievement: 02/11/21 Potential to Achieve Goals: Good ADL Goals Pt Will Perform Grooming: with modified independence;standing;sitting Pt Will Perform Lower Body Dressing: with modified independence;sit to/from stand Pt Will Transfer to Toilet: with modified independence;ambulating;bedside commode Pt Will Perform Toileting - Clothing Manipulation and hygiene: with modified independence;sit to/from stand Additional ADL Goal #1: Pt will verbalize 3 fall prevention techniques to optimize safety with ADL routine.  Plan Discharge plan remains appropriate;Frequency remains appropriate    Co-evaluation                 AM-PAC OT "6 Clicks" Daily Activity     Outcome Measure   Help from another person eating meals?: None Help from another person taking care of personal grooming?: A Little Help from another person  toileting, which includes using toliet, bedpan, or urinal?: A Little Help from another person bathing (including washing, rinsing, drying)?: A Lot Help from another person to put on and taking off regular upper body clothing?: None Help from another person to put on and taking off regular lower body clothing?: A Little 6 Click Score: 19    End of Session Equipment Utilized During Treatment: Rolling walker  OT Visit Diagnosis: Other abnormalities of gait and mobility (R26.89);Muscle weakness (generalized) (M62.81);History of falling (Z91.81)   Activity Tolerance Patient tolerated treatment well   Patient Left in chair;with call bell/phone within reach;with chair alarm set   Nurse Communication  (ok therapy)        Time: 7342-8768 OT Time Calculation (min): 25 min  Charges: OT General Charges $OT Visit: 1 Visit OT Treatments $Self Care/Home Management : 23-37 mins  Reece Packer OT/L    Esterlene Atiyeh 02/01/2021, 9:29 AM

## 2021-02-03 ENCOUNTER — Ambulatory Visit: Payer: Medicare Other | Admitting: Family

## 2021-02-03 ENCOUNTER — Encounter: Payer: Self-pay | Admitting: Orthopedic Surgery

## 2021-02-03 ENCOUNTER — Non-Acute Institutional Stay: Payer: Medicare Other | Admitting: Orthopedic Surgery

## 2021-02-03 DIAGNOSIS — I4821 Permanent atrial fibrillation: Secondary | ICD-10-CM | POA: Diagnosis not present

## 2021-02-03 DIAGNOSIS — K219 Gastro-esophageal reflux disease without esophagitis: Secondary | ICD-10-CM | POA: Diagnosis not present

## 2021-02-03 DIAGNOSIS — R1311 Dysphagia, oral phase: Secondary | ICD-10-CM | POA: Diagnosis not present

## 2021-02-03 DIAGNOSIS — N184 Chronic kidney disease, stage 4 (severe): Secondary | ICD-10-CM | POA: Diagnosis not present

## 2021-02-03 DIAGNOSIS — R41841 Cognitive communication deficit: Secondary | ICD-10-CM | POA: Diagnosis not present

## 2021-02-03 DIAGNOSIS — M6281 Muscle weakness (generalized): Secondary | ICD-10-CM | POA: Diagnosis not present

## 2021-02-03 DIAGNOSIS — I5032 Chronic diastolic (congestive) heart failure: Secondary | ICD-10-CM

## 2021-02-03 DIAGNOSIS — R2689 Other abnormalities of gait and mobility: Secondary | ICD-10-CM | POA: Diagnosis not present

## 2021-02-03 DIAGNOSIS — E039 Hypothyroidism, unspecified: Secondary | ICD-10-CM | POA: Diagnosis not present

## 2021-02-03 DIAGNOSIS — D638 Anemia in other chronic diseases classified elsewhere: Secondary | ICD-10-CM

## 2021-02-03 DIAGNOSIS — I480 Paroxysmal atrial fibrillation: Secondary | ICD-10-CM

## 2021-02-03 DIAGNOSIS — F411 Generalized anxiety disorder: Secondary | ICD-10-CM | POA: Diagnosis not present

## 2021-02-03 DIAGNOSIS — R2681 Unsteadiness on feet: Secondary | ICD-10-CM | POA: Diagnosis not present
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
## 2021-02-04 ENCOUNTER — Other Ambulatory Visit: Payer: Self-pay

## 2021-02-04 ENCOUNTER — Encounter: Payer: Medicare Other | Admitting: Nurse Practitioner

## 2021-02-04 DIAGNOSIS — M6281 Muscle weakness (generalized): Secondary | ICD-10-CM | POA: Diagnosis not present

## 2021-02-04 DIAGNOSIS — R2689 Other abnormalities of gait and mobility: Secondary | ICD-10-CM | POA: Diagnosis not present

## 2021-02-04 DIAGNOSIS — R1311 Dysphagia, oral phase: Secondary | ICD-10-CM | POA: Diagnosis not present

## 2021-02-04 DIAGNOSIS — R41841 Cognitive communication deficit: Secondary | ICD-10-CM | POA: Diagnosis not present

## 2021-02-04 DIAGNOSIS — I4821 Permanent atrial fibrillation: Secondary | ICD-10-CM | POA: Diagnosis not present

## 2021-02-04 DIAGNOSIS — R2681 Unsteadiness on feet: Secondary | ICD-10-CM | POA: Diagnosis not present

## 2021-02-06 DIAGNOSIS — I4821 Permanent atrial fibrillation: Secondary | ICD-10-CM | POA: Diagnosis not present

## 2021-02-06 DIAGNOSIS — I10 Essential (primary) hypertension: Secondary | ICD-10-CM | POA: Diagnosis not present

## 2021-02-06 DIAGNOSIS — R2681 Unsteadiness on feet: Secondary | ICD-10-CM | POA: Diagnosis not present

## 2021-02-06 DIAGNOSIS — M6281 Muscle weakness (generalized): Secondary | ICD-10-CM | POA: Diagnosis not present

## 2021-02-06 DIAGNOSIS — R2689 Other abnormalities of gait and mobility: Secondary | ICD-10-CM | POA: Diagnosis not present

## 2021-02-06 DIAGNOSIS — R41841 Cognitive communication deficit: Secondary | ICD-10-CM | POA: Diagnosis not present

## 2021-02-06 DIAGNOSIS — R1311 Dysphagia, oral phase: Secondary | ICD-10-CM | POA: Diagnosis not present

## 2021-02-06 LAB — COMPREHENSIVE METABOLIC PANEL: Calcium: 8.3 — AB (ref 8.7–10.7)

## 2021-02-06 LAB — BASIC METABOLIC PANEL
BUN: 54 — AB (ref 4–21)
CO2: 27 — AB (ref 13–22)
Chloride: 90 — AB (ref 99–108)
Creatinine: 2 — AB (ref 0.6–1.3)
Glucose: 81
Potassium: 4 (ref 3.4–5.3)
Sodium: 128 — AB (ref 137–147)

## 2021-02-06 NOTE — Addendum Note (Signed)
Addended byWindell Moulding E on: 02/06/2021 09:41 AM   Modules accepted: Level of Service

## 2021-02-07 DIAGNOSIS — M6281 Muscle weakness (generalized): Secondary | ICD-10-CM | POA: Diagnosis not present

## 2021-02-07 DIAGNOSIS — R2689 Other abnormalities of gait and mobility: Secondary | ICD-10-CM | POA: Diagnosis not present

## 2021-02-07 DIAGNOSIS — E8779 Other fluid overload: Secondary | ICD-10-CM | POA: Diagnosis not present

## 2021-02-07 DIAGNOSIS — R601 Generalized edema: Secondary | ICD-10-CM | POA: Diagnosis not present

## 2021-02-07 DIAGNOSIS — R2681 Unsteadiness on feet: Secondary | ICD-10-CM | POA: Diagnosis not present

## 2021-02-07 DIAGNOSIS — I4821 Permanent atrial fibrillation: Secondary | ICD-10-CM | POA: Diagnosis not present

## 2021-02-07 DIAGNOSIS — I5021 Acute systolic (congestive) heart failure: Secondary | ICD-10-CM | POA: Diagnosis not present

## 2021-02-07 DIAGNOSIS — N1832 Chronic kidney disease, stage 3b: Secondary | ICD-10-CM | POA: Diagnosis not present

## 2021-02-07 DIAGNOSIS — R319 Hematuria, unspecified: Secondary | ICD-10-CM | POA: Diagnosis not present

## 2021-02-10 ENCOUNTER — Telehealth: Payer: Self-pay | Admitting: Adult Health

## 2021-02-10 DIAGNOSIS — E8779 Other fluid overload: Secondary | ICD-10-CM | POA: Diagnosis not present

## 2021-02-10 DIAGNOSIS — I5021 Acute systolic (congestive) heart failure: Secondary | ICD-10-CM | POA: Diagnosis not present

## 2021-02-10 DIAGNOSIS — R2681 Unsteadiness on feet: Secondary | ICD-10-CM | POA: Diagnosis not present

## 2021-02-10 DIAGNOSIS — M6281 Muscle weakness (generalized): Secondary | ICD-10-CM | POA: Diagnosis not present

## 2021-02-10 DIAGNOSIS — I4821 Permanent atrial fibrillation: Secondary | ICD-10-CM | POA: Diagnosis not present

## 2021-02-10 DIAGNOSIS — R2689 Other abnormalities of gait and mobility: Secondary | ICD-10-CM | POA: Diagnosis not present

## 2021-02-10 NOTE — Telephone Encounter (Signed)
Nurse called from [place] to report the INR is 3.6 as a routine check. Pt is not have any concerns and is not on antibiotics per the nurse. Will hold today's dose and recheck in the am

## 2021-02-10 NOTE — Telephone Encounter (Signed)
Nurse called to report penile swelling on this resident. No pain or drainage noted. Pt has a hx of balanitis. I restarted clotrimazole cream bid for 14 days to this area which was already on the med list but the nurse stated he had completed the course. He is voiding but she is not sure how much. She will start monitoring I and O's and if he is having difficulty or having <200 cc will attempt catheterization to empty the bladder as they do not have a bladder scanner. She reports he is not having any sob and his weight is stable at 164 lbs. Staff instructed to continue to monitor symptoms and report back to [facility] if there are issues.

## 2021-02-11 ENCOUNTER — Non-Acute Institutional Stay: Payer: Medicare Other | Admitting: Nurse Practitioner

## 2021-02-11 ENCOUNTER — Encounter: Payer: Self-pay | Admitting: Nurse Practitioner

## 2021-02-11 DIAGNOSIS — E785 Hyperlipidemia, unspecified: Secondary | ICD-10-CM

## 2021-02-11 DIAGNOSIS — K5901 Slow transit constipation: Secondary | ICD-10-CM

## 2021-02-11 DIAGNOSIS — F411 Generalized anxiety disorder: Secondary | ICD-10-CM | POA: Diagnosis not present

## 2021-02-11 DIAGNOSIS — I480 Paroxysmal atrial fibrillation: Secondary | ICD-10-CM

## 2021-02-11 DIAGNOSIS — E871 Hypo-osmolality and hyponatremia: Secondary | ICD-10-CM

## 2021-02-11 DIAGNOSIS — E039 Hypothyroidism, unspecified: Secondary | ICD-10-CM

## 2021-02-11 DIAGNOSIS — K219 Gastro-esophageal reflux disease without esophagitis: Secondary | ICD-10-CM

## 2021-02-11 DIAGNOSIS — N184 Chronic kidney disease, stage 4 (severe): Secondary | ICD-10-CM

## 2021-02-11 DIAGNOSIS — I5032 Chronic diastolic (congestive) heart failure: Secondary | ICD-10-CM

## 2021-02-11 DIAGNOSIS — N481 Balanitis: Secondary | ICD-10-CM | POA: Diagnosis not present
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
## 2021-02-11 NOTE — Assessment & Plan Note (Signed)
takes Sertraline, prn Diazepam

## 2021-02-11 NOTE — Assessment & Plan Note (Addendum)
Bun/creat 52/2.0 eGFR 30 02/06/21. Hgb 10.4 01/31/21

## 2021-02-11 NOTE — Assessment & Plan Note (Signed)
Continue Metoprolol, Coumadin.

## 2021-02-11 NOTE — Assessment & Plan Note (Signed)
Balanitis, small amount of cottage cheese apparent discharge, no redness, pain or itching. Restarted Clotrimazole cream.

## 2021-02-11 NOTE — Assessment & Plan Note (Signed)
Stable, continue Omeprazole.  

## 2021-02-11 NOTE — Assessment & Plan Note (Signed)
on Simvastatin

## 2021-02-11 NOTE — Assessment & Plan Note (Signed)
switched to Torsemide 40mg  qd since 02/03/21, wight gained about #7 ibs in the past week, penil/scrotal swelling,  bibasilar rales, DOE, needs watching weight. LVEF 40-45%. F/u Cardiology. Not on ACEI/ARB/ARNI 2nd to CKD. Will add Metolazone 2.5mg  qd, update BMP one week.

## 2021-02-11 NOTE — Assessment & Plan Note (Signed)
Stable, continue MOM prn

## 2021-02-11 NOTE — Assessment & Plan Note (Signed)
Levothyroxine 22mcg qd, TSH 5.933, Free T4 1.11 in hospital 01/03/21

## 2021-02-11 NOTE — Assessment & Plan Note (Signed)
Na 128 02/06/21, adding Metolazone for more aggressive diuresis for now. Repeat BMP in one week.

## 2021-02-12 ENCOUNTER — Emergency Department (HOSPITAL_COMMUNITY): Payer: Medicare Other

## 2021-02-12 ENCOUNTER — Inpatient Hospital Stay (HOSPITAL_COMMUNITY)
Admission: EM | Admit: 2021-02-12 | Discharge: 2021-02-25 | DRG: 551 | Disposition: A | Discharge status: Skilled Nursing Facility | Payer: Medicare Other | Source: Skilled Nursing Facility | Attending: Internal Medicine | Admitting: Internal Medicine

## 2021-02-12 ENCOUNTER — Encounter (HOSPITAL_COMMUNITY): Payer: Self-pay | Admitting: Emergency Medicine

## 2021-02-12 ENCOUNTER — Other Ambulatory Visit: Payer: Self-pay

## 2021-02-12 DIAGNOSIS — N183 Chronic kidney disease, stage 3 unspecified: Secondary | ICD-10-CM

## 2021-02-12 DIAGNOSIS — S7001XA Contusion of right hip, initial encounter: Secondary | ICD-10-CM

## 2021-02-12 DIAGNOSIS — S329XXA Fracture of unspecified parts of lumbosacral spine and pelvis, initial encounter for closed fracture: Secondary | ICD-10-CM | POA: Diagnosis present

## 2021-02-12 DIAGNOSIS — S32592A Other specified fracture of left pubis, initial encounter for closed fracture: Secondary | ICD-10-CM | POA: Diagnosis not present

## 2021-02-12 DIAGNOSIS — W19XXXA Unspecified fall, initial encounter: Secondary | ICD-10-CM | POA: Diagnosis present

## 2021-02-12 DIAGNOSIS — S22010A Wedge compression fracture of first thoracic vertebra, initial encounter for closed fracture: Secondary | ICD-10-CM

## 2021-02-12 DIAGNOSIS — N179 Acute kidney failure, unspecified: Secondary | ICD-10-CM

## 2021-02-12 DIAGNOSIS — S32599A Other specified fracture of unspecified pubis, initial encounter for closed fracture: Secondary | ICD-10-CM | POA: Diagnosis present

## 2021-02-12 DIAGNOSIS — D62 Acute posthemorrhagic anemia: Secondary | ICD-10-CM

## 2021-02-12 DIAGNOSIS — E440 Moderate protein-calorie malnutrition: Secondary | ICD-10-CM | POA: Insufficient documentation

## 2021-02-12 DIAGNOSIS — I482 Chronic atrial fibrillation, unspecified: Secondary | ICD-10-CM

## 2021-02-12 DIAGNOSIS — R0989 Other specified symptoms and signs involving the circulatory and respiratory systems: Secondary | ICD-10-CM

## 2021-02-12 DIAGNOSIS — S51011A Laceration without foreign body of right elbow, initial encounter: Secondary | ICD-10-CM | POA: Diagnosis present

## 2021-02-12 DIAGNOSIS — S32010A Wedge compression fracture of first lumbar vertebra, initial encounter for closed fracture: Secondary | ICD-10-CM | POA: Diagnosis not present

## 2021-02-12 DIAGNOSIS — E861 Hypovolemia: Secondary | ICD-10-CM | POA: Diagnosis present

## 2021-02-12 DIAGNOSIS — N1832 Chronic kidney disease, stage 3b: Secondary | ICD-10-CM | POA: Diagnosis present

## 2021-02-12 DIAGNOSIS — R54 Age-related physical debility: Secondary | ICD-10-CM | POA: Diagnosis present

## 2021-02-12 DIAGNOSIS — M47812 Spondylosis without myelopathy or radiculopathy, cervical region: Secondary | ICD-10-CM | POA: Diagnosis not present

## 2021-02-12 DIAGNOSIS — I4510 Unspecified right bundle-branch block: Secondary | ICD-10-CM | POA: Diagnosis present

## 2021-02-12 DIAGNOSIS — K5900 Constipation, unspecified: Secondary | ICD-10-CM | POA: Diagnosis not present

## 2021-02-12 DIAGNOSIS — S299XXA Unspecified injury of thorax, initial encounter: Secondary | ICD-10-CM | POA: Diagnosis not present

## 2021-02-12 DIAGNOSIS — S32491A Other specified fracture of right acetabulum, initial encounter for closed fracture: Secondary | ICD-10-CM | POA: Diagnosis not present

## 2021-02-12 DIAGNOSIS — Z9181 History of falling: Secondary | ICD-10-CM

## 2021-02-12 DIAGNOSIS — Z6822 Body mass index (BMI) 22.0-22.9, adult: Secondary | ICD-10-CM

## 2021-02-12 DIAGNOSIS — Z66 Do not resuscitate: Secondary | ICD-10-CM | POA: Diagnosis not present

## 2021-02-12 DIAGNOSIS — W1839XA Other fall on same level, initial encounter: Secondary | ICD-10-CM | POA: Diagnosis present

## 2021-02-12 DIAGNOSIS — F32A Depression, unspecified: Secondary | ICD-10-CM | POA: Diagnosis present

## 2021-02-12 DIAGNOSIS — R296 Repeated falls: Secondary | ICD-10-CM | POA: Diagnosis present

## 2021-02-12 DIAGNOSIS — Y92129 Unspecified place in nursing home as the place of occurrence of the external cause: Secondary | ICD-10-CM

## 2021-02-12 DIAGNOSIS — Z9889 Other specified postprocedural states: Secondary | ICD-10-CM | POA: Diagnosis not present

## 2021-02-12 DIAGNOSIS — F419 Anxiety disorder, unspecified: Secondary | ICD-10-CM | POA: Diagnosis present

## 2021-02-12 DIAGNOSIS — S22020A Wedge compression fracture of second thoracic vertebra, initial encounter for closed fracture: Secondary | ICD-10-CM | POA: Diagnosis present

## 2021-02-12 DIAGNOSIS — E871 Hypo-osmolality and hyponatremia: Secondary | ICD-10-CM | POA: Diagnosis present

## 2021-02-12 DIAGNOSIS — E785 Hyperlipidemia, unspecified: Secondary | ICD-10-CM | POA: Diagnosis present

## 2021-02-12 DIAGNOSIS — W07XXXA Fall from chair, initial encounter: Secondary | ICD-10-CM | POA: Diagnosis present

## 2021-02-12 DIAGNOSIS — S0003XA Contusion of scalp, initial encounter: Secondary | ICD-10-CM | POA: Diagnosis not present

## 2021-02-12 DIAGNOSIS — I4821 Permanent atrial fibrillation: Secondary | ICD-10-CM | POA: Diagnosis present

## 2021-02-12 DIAGNOSIS — R0902 Hypoxemia: Secondary | ICD-10-CM | POA: Diagnosis not present

## 2021-02-12 DIAGNOSIS — S32512A Fracture of superior rim of left pubis, initial encounter for closed fracture: Secondary | ICD-10-CM | POA: Diagnosis not present

## 2021-02-12 DIAGNOSIS — R269 Unspecified abnormalities of gait and mobility: Secondary | ICD-10-CM | POA: Diagnosis present

## 2021-02-12 DIAGNOSIS — I130 Hypertensive heart and chronic kidney disease with heart failure and stage 1 through stage 4 chronic kidney disease, or unspecified chronic kidney disease: Secondary | ICD-10-CM | POA: Diagnosis present

## 2021-02-12 DIAGNOSIS — R918 Other nonspecific abnormal finding of lung field: Secondary | ICD-10-CM | POA: Diagnosis not present

## 2021-02-12 DIAGNOSIS — I959 Hypotension, unspecified: Secondary | ICD-10-CM | POA: Diagnosis present

## 2021-02-12 DIAGNOSIS — I2722 Pulmonary hypertension due to left heart disease: Secondary | ICD-10-CM | POA: Diagnosis present

## 2021-02-12 DIAGNOSIS — E039 Hypothyroidism, unspecified: Secondary | ICD-10-CM | POA: Diagnosis present

## 2021-02-12 DIAGNOSIS — E876 Hypokalemia: Secondary | ICD-10-CM | POA: Diagnosis not present

## 2021-02-12 DIAGNOSIS — Z20822 Contact with and (suspected) exposure to covid-19: Secondary | ICD-10-CM | POA: Diagnosis present

## 2021-02-12 DIAGNOSIS — S0990XA Unspecified injury of head, initial encounter: Secondary | ICD-10-CM | POA: Diagnosis not present

## 2021-02-12 DIAGNOSIS — I5043 Acute on chronic combined systolic (congestive) and diastolic (congestive) heart failure: Secondary | ICD-10-CM | POA: Diagnosis not present

## 2021-02-12 DIAGNOSIS — S3282XA Multiple fractures of pelvis without disruption of pelvic ring, initial encounter for closed fracture: Secondary | ICD-10-CM | POA: Diagnosis not present

## 2021-02-12 DIAGNOSIS — R000 Tachycardia, unspecified: Secondary | ICD-10-CM | POA: Diagnosis not present

## 2021-02-12 DIAGNOSIS — Z043 Encounter for examination and observation following other accident: Secondary | ICD-10-CM | POA: Diagnosis not present

## 2021-02-12 DIAGNOSIS — D631 Anemia in chronic kidney disease: Secondary | ICD-10-CM | POA: Diagnosis present

## 2021-02-12 DIAGNOSIS — Z7901 Long term (current) use of anticoagulants: Secondary | ICD-10-CM

## 2021-02-12 DIAGNOSIS — Z79890 Hormone replacement therapy (postmenopausal): Secondary | ICD-10-CM

## 2021-02-12 DIAGNOSIS — I4891 Unspecified atrial fibrillation: Secondary | ICD-10-CM | POA: Diagnosis not present

## 2021-02-12 LAB — BASIC METABOLIC PANEL
Anion gap: 9 (ref 5–15)
BUN: 48 mg/dL — ABNORMAL HIGH (ref 8–23)
CO2: 27 mmol/L (ref 22–32)
Calcium: 8.4 mg/dL — ABNORMAL LOW (ref 8.9–10.3)
Chloride: 90 mmol/L — ABNORMAL LOW (ref 98–111)
Creatinine, Ser: 2.1 mg/dL — ABNORMAL HIGH (ref 0.61–1.24)
GFR, Estimated: 28 mL/min — ABNORMAL LOW (ref >60)
Glucose, Bld: 126 mg/dL — ABNORMAL HIGH (ref 70–99)
Potassium: 4.2 mmol/L (ref 3.5–5.1)
Sodium: 126 mmol/L — ABNORMAL LOW (ref 135–145)

## 2021-02-12 LAB — PROTIME-INR
INR: 3.2 — ABNORMAL HIGH (ref 0.8–1.2)
Prothrombin Time: 32.4 seconds — ABNORMAL HIGH (ref 11.4–15.2)

## 2021-02-12 LAB — CBC
HCT: 34.2 % — ABNORMAL LOW (ref 39.0–52.0)
Hemoglobin: 10.9 g/dL — ABNORMAL LOW (ref 13.0–17.0)
MCH: 29.9 pg (ref 26.0–34.0)
MCHC: 31.9 g/dL (ref 30.0–36.0)
MCV: 94 fL (ref 80.0–100.0)
Platelets: 188 10*3/uL (ref 150–400)
RBC: 3.64 MIL/uL — ABNORMAL LOW (ref 4.22–5.81)
RDW: 14.5 % (ref 11.5–15.5)
WBC: 11.3 10*3/uL — ABNORMAL HIGH (ref 4.0–10.5)
nRBC: 0 % (ref 0.0–0.2)
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
## 2021-02-12 NOTE — ED Notes (Signed)
Patient transported to CT scan . 

## 2021-02-12 NOTE — ED Notes (Signed)
C-collar removed by Dr. Tomi Bamberger.

## 2021-02-12 NOTE — Progress Notes (Signed)
   02/12/21 2235  Clinical Encounter Type  Visited With Patient not available  Visit Type Trauma  Referral From Nurse  Consult/Referral To Chaplain    Fall on thinners page. This Chaplain is not needed at this time. Chaplain remains available. This note was prepared by Jeanine Luz, M.Div..  For questions please contact by phone [phone].

## 2021-02-12 NOTE — ED Provider Notes (Signed)
[facility] EMERGENCY DEPARTMENT Provider Note   CSN: [id number] Arrival date & time: 02/12/21  2234     History Chief Complaint  Patient presents with  . Level2: Fall on Coumadin     [name] is a 87 y.o. male.  HPI  Patient presents to the ED for evaluation of a fall.  Patient resides at a nursing facility.  He takes Coumadin for atrial fibrillation.  He also has history of congestive heart failure.  Patient was attempting to stand up from a chair when he is lost his balance and fell backwards.  He ended up hitting his head and also is complaining of some pain in his buttock.  Patient also sustained a laceration to his elbow.  He denies any loss of consciousness.  He is not having any numbness or weakness.  No fevers or chills  History reviewed. No pertinent past medical history.  There are no problems to display for this patient.   History reviewed. No pertinent surgical history.     No family history on file.     Home Medications Prior to Admission medications   Not on File    Allergies    Patient has no allergy information on record.  Review of Systems   Review of Systems  All other systems reviewed and are negative.  Physical Exam Updated Vital Signs BP 114/68   Pulse 95   Temp 98.2 F (36.8 C) (Oral)   Resp 19   Ht 1.702 m (5\' 7" )   Wt 65 kg   SpO2 99%   BMI 22.44 kg/m   Physical Exam Vitals and nursing note reviewed.  Constitutional:      Appearance: He is well-developed.     Comments: Elderly frail, dried scab on scalp  HENT:     Head: Normocephalic and atraumatic.     Right Ear: External ear normal.     Left Ear: External ear normal.  Eyes:     General: No scleral icterus.       Right eye: No discharge.        Left eye: No discharge.     Conjunctiva/sclera: Conjunctivae normal.  Neck:     Trachea: No tracheal deviation.  Cardiovascular:     Rate and Rhythm: Normal rate and regular rhythm.  Pulmonary:      Effort: Pulmonary effort is normal. No respiratory distress.     Breath sounds: Normal breath sounds. No stridor. No wheezing or rales.  Chest:     Comments: Old bruising noted below the chest on the left side, patient states this was from a previous fall Abdominal:     General: Bowel sounds are normal. There is no distension.     Palpations: Abdomen is soft.     Tenderness: There is no abdominal tenderness. There is no guarding or rebound.  Musculoskeletal:        General: No tenderness or deformity.     Cervical back: Normal and neck supple.     Thoracic back: Normal.     Lumbar back: Normal.     Comments: Mild tenderness palpation left buttock, no bruising noted; laceration noted around right olecranon process  Skin:    General: Skin is warm and dry.     Findings: No rash.  Neurological:     General: No focal deficit present.     Mental Status: He is alert.     Cranial Nerves: No cranial nerve deficit (no facial droop, extraocular movements intact,  no slurred speech).     Sensory: No sensory deficit.     Motor: No abnormal muscle tone or seizure activity.     Coordination: Coordination normal.  Psychiatric:        Mood and Affect: Mood normal.    ED Results / Procedures / Treatments   Labs (all labs ordered are listed, but only abnormal results are displayed) Labs Reviewed  CBC - Abnormal; Notable for the following components:      Result Value   WBC 11.3 (*)    RBC 3.64 (*)    Hemoglobin 10.9 (*)    HCT 34.2 (*)    All other components within normal limits  PROTIME-INR - Abnormal; Notable for the following components:   Prothrombin Time 32.4 (*)    INR 3.2 (*)    All other components within normal limits  RESP PANEL BY RT-PCR (FLU A&B, COVID) ARPGX2  BASIC METABOLIC PANEL    EKG None  Radiology DG Pelvis 1-2 Views  Result Date: 02/12/2021 CLINICAL DATA:  Trauma, fall EXAM: PELVIS - 1-2 VIEW COMPARISON:  12/31/2020 FINDINGS: SI joints are non widened. Both  femoral heads project in joint. Acute displaced fractures involving the left superior and inferior pubic rami. IMPRESSION: Acute displaced fractures involving left superior and inferior pubic rami. Electronically Signed   By: Donavan Foil M.D.   On: 02/12/2021 22:56   CT Head Wo Contrast  Result Date: 02/12/2021 CLINICAL DATA:  Lost balance leading to fall this evening. On anticoagulation. EXAM: CT HEAD WITHOUT CONTRAST TECHNIQUE: Contiguous axial images were obtained from the base of the skull through the vertex without intravenous contrast. COMPARISON:  Head CT 12/31/2020 FINDINGS: Brain: Stable degree of atrophy and chronic small vessel ischemia. No intracranial hemorrhage, mass effect, or midline shift. No hydrocephalus. The basilar cisterns are patent. No evidence of territorial infarct or acute ischemia. No extra-axial or intracranial fluid collection. Vascular: Atherosclerosis of skullbase vasculature without hyperdense vessel or abnormal calcification. Skull: No fracture or focal lesion. Sinuses/Orbits: No acute findings.  Bilateral cataract resection. Other: Contracting right parietal scalp hematoma, diminished in size from prior. IMPRESSION: 1. No acute intracranial abnormality. No skull fracture. 2. Stable atrophy and chronic small vessel ischemia. Electronically Signed   By: Keith Rake M.D.   On: 02/12/2021 23:06   CT Cervical Spine Wo Contrast  Result Date: 02/12/2021 CLINICAL DATA:  Lost balance leading to fall seen knee. EXAM: CT CERVICAL SPINE WITHOUT CONTRAST TECHNIQUE: Multidetector CT imaging of the cervical spine was performed without intravenous contrast. Multiplanar CT image reconstructions were also generated. COMPARISON:  12/31/2020 FINDINGS: Alignment: Exaggerated cervical lordosis, unchanged from prior. No traumatic listhesis. Skull base and vertebrae: No acute cervical fracture. There is mild anterior wedging of T1 and T2 superior endplate, new from prior exam. Cervical  vertebral body heights are maintained. The dens and skull base are intact. Soft tissues and spinal canal: No prevertebral fluid or swelling. No visible canal hematoma. Disc levels: Stable diffuse degenerative disc disease and multilevel facet hypertrophy. Upper chest: Nonacute. Other: Carotid calcifications IMPRESSION: 1. No acute cervical spine fracture or subluxation. 2. Mild anterior wedging of T1 and T2 superior endplate, new from 12/31/2020 exam. Findings are suspicious for acute or subacute mild compression fractures. 3. Stable multilevel degenerative disc disease and facet hypertrophy. Electronically Signed   By: Keith Rake M.D.   On: 02/12/2021 23:11   DG Chest Portable 1 View  Result Date: 02/12/2021 CLINICAL DATA:  Level 2 trauma.  Fall. EXAM: PORTABLE  CHEST 1 VIEW COMPARISON:  01/27/2021 FINDINGS: Postoperative changes in the mediastinum. Heart size and pulmonary vascularity are normal. Diffuse fine nodular interstitial pattern to the lungs is similar to prior study likely representing chronic interstitial lung disease. No developing consolidation, effusion, or pneumothorax. Degenerative changes in the spine and shoulders. IMPRESSION: Diffuse fine nodular interstitial pattern to the lungs is similar to prior study, likely chronic interstitial lung disease. Electronically Signed   By: Lucienne Capers M.D.   On: 02/12/2021 22:54    Procedures Procedures   Medications Ordered in ED Medications - No data to display  ED Course  I have reviewed the triage vital signs and the nursing notes.  Pertinent labs & imaging results that were available during my care of the patient were reviewed by me and considered in my medical decision making (see chart for details).  Clinical Course as of 02/12/21 2346  Wed Feb 12, 2021  2331 Cervical spine shows mild wedging of T1-T2 [JK]  2331 Head CT without acute findings [JK]  2332 Pelvis shows acute fractures of left superior and inferior pubic rami  [JK]  2334 Previous hemoglobin was 10.4.  Hemoglobin today of 10.9 and stable [JK]  2335 INR therapeutic at 3.2 [JK]  2345 Discussed with Dr Marlou Sa.  Will follow up on patient. [JK]    Clinical Course User Index [JK] Dorie Rank, MD   MDM Rules/Calculators/A&P                          Patient presented for evaluation after ground-level fall.  Patient is on Coumadin.  He was activated as a level 2 trauma.  No acute head injury on head CT.  C-spine CT does not show any cervical spine fracture but there is a possible wedge fracture of T1 and T2.  Patient also has evidence of pubic rami fractures.  Patient will likely need PT OT assistance.  Discussed case with Dr. Rosendo Gros trauma surgery.  Patient would benefit from medical admission concerning his multiple comorbidities. Final Clinical Impression(s) / ED Diagnoses Final diagnoses:  Closed fracture of multiple pubic rami, left, initial encounter [facility])  Closed wedge compression fracture of T1 vertebra, initial encounter [facility])  Fall, initial encounter     Dorie Rank, MD 02/12/21 2338

## 2021-02-13 ENCOUNTER — Inpatient Hospital Stay (HOSPITAL_COMMUNITY): Payer: Medicare Other

## 2021-02-13 DIAGNOSIS — S32599A Other specified fracture of unspecified pubis, initial encounter for closed fracture: Secondary | ICD-10-CM | POA: Diagnosis present

## 2021-02-13 DIAGNOSIS — S329XXA Fracture of unspecified parts of lumbosacral spine and pelvis, initial encounter for closed fracture: Secondary | ICD-10-CM | POA: Diagnosis present

## 2021-02-13 DIAGNOSIS — Z7901 Long term (current) use of anticoagulants: Secondary | ICD-10-CM | POA: Diagnosis not present

## 2021-02-13 DIAGNOSIS — I4821 Permanent atrial fibrillation: Secondary | ICD-10-CM | POA: Diagnosis present

## 2021-02-13 DIAGNOSIS — I700 Atherosclerosis of aorta: Secondary | ICD-10-CM | POA: Diagnosis not present

## 2021-02-13 DIAGNOSIS — I5043 Acute on chronic combined systolic (congestive) and diastolic (congestive) heart failure: Secondary | ICD-10-CM | POA: Diagnosis not present

## 2021-02-13 DIAGNOSIS — R54 Age-related physical debility: Secondary | ICD-10-CM | POA: Diagnosis present

## 2021-02-13 DIAGNOSIS — I2722 Pulmonary hypertension due to left heart disease: Secondary | ICD-10-CM | POA: Diagnosis present

## 2021-02-13 DIAGNOSIS — N179 Acute kidney failure, unspecified: Secondary | ICD-10-CM

## 2021-02-13 DIAGNOSIS — I482 Chronic atrial fibrillation, unspecified: Secondary | ICD-10-CM | POA: Diagnosis not present

## 2021-02-13 DIAGNOSIS — S51011A Laceration without foreign body of right elbow, initial encounter: Secondary | ICD-10-CM | POA: Diagnosis present

## 2021-02-13 DIAGNOSIS — D62 Acute posthemorrhagic anemia: Secondary | ICD-10-CM | POA: Diagnosis present

## 2021-02-13 DIAGNOSIS — S32592A Other specified fracture of left pubis, initial encounter for closed fracture: Secondary | ICD-10-CM | POA: Diagnosis not present

## 2021-02-13 DIAGNOSIS — I340 Nonrheumatic mitral (valve) insufficiency: Secondary | ICD-10-CM | POA: Diagnosis not present

## 2021-02-13 DIAGNOSIS — W19XXXA Unspecified fall, initial encounter: Secondary | ICD-10-CM

## 2021-02-13 DIAGNOSIS — E039 Hypothyroidism, unspecified: Secondary | ICD-10-CM | POA: Diagnosis present

## 2021-02-13 DIAGNOSIS — D631 Anemia in chronic kidney disease: Secondary | ICD-10-CM | POA: Diagnosis present

## 2021-02-13 DIAGNOSIS — E861 Hypovolemia: Secondary | ICD-10-CM | POA: Diagnosis present

## 2021-02-13 DIAGNOSIS — R0609 Other forms of dyspnea: Secondary | ICD-10-CM | POA: Diagnosis not present

## 2021-02-13 DIAGNOSIS — R531 Weakness: Secondary | ICD-10-CM | POA: Diagnosis not present

## 2021-02-13 DIAGNOSIS — W1839XA Other fall on same level, initial encounter: Secondary | ICD-10-CM | POA: Diagnosis present

## 2021-02-13 DIAGNOSIS — I959 Hypotension, unspecified: Secondary | ICD-10-CM | POA: Diagnosis present

## 2021-02-13 DIAGNOSIS — S32491A Other specified fracture of right acetabulum, initial encounter for closed fracture: Secondary | ICD-10-CM | POA: Diagnosis present

## 2021-02-13 DIAGNOSIS — R0989 Other specified symptoms and signs involving the circulatory and respiratory systems: Secondary | ICD-10-CM | POA: Diagnosis not present

## 2021-02-13 DIAGNOSIS — R600 Localized edema: Secondary | ICD-10-CM | POA: Diagnosis not present

## 2021-02-13 DIAGNOSIS — Z743 Need for continuous supervision: Secondary | ICD-10-CM | POA: Diagnosis not present

## 2021-02-13 DIAGNOSIS — R5381 Other malaise: Secondary | ICD-10-CM | POA: Diagnosis not present

## 2021-02-13 DIAGNOSIS — I130 Hypertensive heart and chronic kidney disease with heart failure and stage 1 through stage 4 chronic kidney disease, or unspecified chronic kidney disease: Secondary | ICD-10-CM | POA: Diagnosis present

## 2021-02-13 DIAGNOSIS — N1832 Chronic kidney disease, stage 3b: Secondary | ICD-10-CM | POA: Diagnosis present

## 2021-02-13 DIAGNOSIS — I517 Cardiomegaly: Secondary | ICD-10-CM | POA: Diagnosis not present

## 2021-02-13 DIAGNOSIS — Z9181 History of falling: Secondary | ICD-10-CM | POA: Diagnosis not present

## 2021-02-13 DIAGNOSIS — S22010A Wedge compression fracture of first thoracic vertebra, initial encounter for closed fracture: Secondary | ICD-10-CM | POA: Diagnosis present

## 2021-02-13 DIAGNOSIS — F32A Depression, unspecified: Secondary | ICD-10-CM | POA: Diagnosis present

## 2021-02-13 DIAGNOSIS — Y92129 Unspecified place in nursing home as the place of occurrence of the external cause: Secondary | ICD-10-CM | POA: Diagnosis not present

## 2021-02-13 DIAGNOSIS — W07XXXA Fall from chair, initial encounter: Secondary | ICD-10-CM | POA: Diagnosis present

## 2021-02-13 DIAGNOSIS — Z20822 Contact with and (suspected) exposure to covid-19: Secondary | ICD-10-CM | POA: Diagnosis present

## 2021-02-13 DIAGNOSIS — Z66 Do not resuscitate: Secondary | ICD-10-CM | POA: Diagnosis present

## 2021-02-13 DIAGNOSIS — S7001XA Contusion of right hip, initial encounter: Secondary | ICD-10-CM | POA: Diagnosis not present

## 2021-02-13 DIAGNOSIS — S22020A Wedge compression fracture of second thoracic vertebra, initial encounter for closed fracture: Secondary | ICD-10-CM | POA: Diagnosis present

## 2021-02-13 DIAGNOSIS — D649 Anemia, unspecified: Secondary | ICD-10-CM | POA: Diagnosis not present

## 2021-02-13 DIAGNOSIS — I361 Nonrheumatic tricuspid (valve) insufficiency: Secondary | ICD-10-CM | POA: Diagnosis not present

## 2021-02-13 DIAGNOSIS — E440 Moderate protein-calorie malnutrition: Secondary | ICD-10-CM | POA: Diagnosis present

## 2021-02-13 DIAGNOSIS — S32512A Fracture of superior rim of left pubis, initial encounter for closed fracture: Secondary | ICD-10-CM | POA: Diagnosis present

## 2021-02-13 DIAGNOSIS — E871 Hypo-osmolality and hyponatremia: Secondary | ICD-10-CM | POA: Diagnosis present

## 2021-02-13 LAB — COMPREHENSIVE METABOLIC PANEL
ALT: 13 U/L (ref 0–44)
AST: 22 U/L (ref 15–41)
Albumin: 2.3 g/dL — ABNORMAL LOW (ref 3.5–5.0)
Alkaline Phosphatase: 100 U/L (ref 38–126)
Anion gap: 11 (ref 5–15)
BUN: 44 mg/dL — ABNORMAL HIGH (ref 8–23)
CO2: 24 mmol/L (ref 22–32)
Calcium: 7.3 mg/dL — ABNORMAL LOW (ref 8.9–10.3)
Chloride: 94 mmol/L — ABNORMAL LOW (ref 98–111)
Creatinine, Ser: 1.88 mg/dL — ABNORMAL HIGH (ref 0.61–1.24)
GFR, Estimated: 32 mL/min — ABNORMAL LOW (ref >60)
Glucose, Bld: 127 mg/dL — ABNORMAL HIGH (ref 70–99)
Potassium: 3.6 mmol/L (ref 3.5–5.1)
Sodium: 129 mmol/L — ABNORMAL LOW (ref 135–145)
Total Bilirubin: 1.1 mg/dL (ref 0.3–1.2)
Total Protein: 4.4 g/dL — ABNORMAL LOW (ref 6.5–8.1)

## 2021-02-13 LAB — CBC WITH DIFFERENTIAL/PLATELET
Abs Immature Granulocytes: 0.05 10*3/uL (ref 0.00–0.07)
Basophils Absolute: 0 10*3/uL (ref 0.0–0.1)
Basophils Relative: 0 %
Eosinophils Absolute: 0 10*3/uL (ref 0.0–0.5)
Eosinophils Relative: 0 %
HCT: 24.3 % — ABNORMAL LOW (ref 39.0–52.0)
Hemoglobin: 7.7 g/dL — ABNORMAL LOW (ref 13.0–17.0)
Immature Granulocytes: 1 %
Lymphocytes Relative: 6 %
Lymphs Abs: 0.6 10*3/uL — ABNORMAL LOW (ref 0.7–4.0)
MCH: 30.4 pg (ref 26.0–34.0)
MCHC: 31.7 g/dL (ref 30.0–36.0)
MCV: 96 fL (ref 80.0–100.0)
Monocytes Absolute: 0.9 10*3/uL (ref 0.1–1.0)
Monocytes Relative: 9 %
Neutro Abs: 7.9 10*3/uL — ABNORMAL HIGH (ref 1.7–7.7)
Neutrophils Relative %: 84 %
Platelets: 170 10*3/uL (ref 150–400)
RBC: 2.53 MIL/uL — ABNORMAL LOW (ref 4.22–5.81)
RDW: 14.6 % (ref 11.5–15.5)
WBC: 9.5 10*3/uL (ref 4.0–10.5)
nRBC: 0.2 % (ref 0.0–0.2)

## 2021-02-13 LAB — RESP PANEL BY RT-PCR (FLU A&B, COVID) ARPGX2
Influenza A by PCR: NEGATIVE
Influenza B by PCR: NEGATIVE
SARS Coronavirus 2 by RT PCR: NEGATIVE

## 2021-02-13 LAB — PROCALCITONIN: Procalcitonin: <0.1 ng/mL

## 2021-02-13 LAB — LACTIC ACID, PLASMA: Lactic Acid, Venous: 2 mmol/L (ref 0.5–1.9)

## 2021-02-13 LAB — PROTIME-INR
INR: 3.4 — ABNORMAL HIGH (ref 0.8–1.2)
Prothrombin Time: 34 seconds — ABNORMAL HIGH (ref 11.4–15.2)

## 2021-02-13 LAB — PREPARE RBC (CROSSMATCH)

## 2021-02-13 LAB — HEMOGLOBIN AND HEMATOCRIT, BLOOD
HCT: 24.4 % — ABNORMAL LOW (ref 39.0–52.0)
Hemoglobin: 8.3 g/dL — ABNORMAL LOW (ref 13.0–17.0)

## 2021-02-13 LAB — ABO/RH: ABO/RH(D): A POS

## 2021-02-13 MED ORDER — CLOTRIMAZOLE 1 % EX CREA
1.0000 "application " | TOPICAL_CREAM | Freq: Two times a day (BID) | CUTANEOUS | Status: DC
  Administered 2021-02-13 – 2021-02-25 (×25): 1 via TOPICAL
  Filled 2021-02-13 (×2): qty 15

## 2021-02-13 MED ORDER — SODIUM CHLORIDE 0.9 % IV SOLN: INTRAVENOUS | Status: DC

## 2021-02-13 MED ORDER — OXYCODONE HCL 5 MG PO TABS
5.0000 mg | ORAL_TABLET | ORAL | Status: DC | PRN
  Administered 2021-02-13: 5 mg via ORAL
  Filled 2021-02-13: qty 1

## 2021-02-13 MED ORDER — DIAZEPAM 5 MG PO TABS: 2.5000 mg | ORAL_TABLET | Freq: Four times a day (QID) | ORAL | Status: DC | PRN

## 2021-02-13 MED ORDER — DOCUSATE SODIUM 100 MG PO CAPS
100.0000 mg | ORAL_CAPSULE | Freq: Two times a day (BID) | ORAL | Status: DC
  Administered 2021-02-13 – 2021-02-25 (×24): 100 mg via ORAL
  Filled 2021-02-13 (×25): qty 1

## 2021-02-13 MED ORDER — MIDODRINE HCL 5 MG PO TABS
5.0000 mg | ORAL_TABLET | Freq: Once | ORAL | Status: AC
  Administered 2021-02-13: 5 mg via ORAL
  Filled 2021-02-13: qty 1

## 2021-02-13 MED ORDER — ASPIRIN 81 MG PO CHEW
81.0000 mg | CHEWABLE_TABLET | Freq: Every day | ORAL | Status: DC
  Administered 2021-02-13: 81 mg via ORAL
  Filled 2021-02-13: qty 1

## 2021-02-13 MED ORDER — PANTOPRAZOLE SODIUM 40 MG PO TBEC
40.0000 mg | DELAYED_RELEASE_TABLET | Freq: Every day | ORAL | Status: DC
  Administered 2021-02-13 – 2021-02-25 (×13): 40 mg via ORAL
  Filled 2021-02-13 (×13): qty 1

## 2021-02-13 MED ORDER — METOPROLOL SUCCINATE ER 25 MG PO TB24: 12.5000 mg | ORAL_TABLET | Freq: Every day | ORAL | Status: DC

## 2021-02-13 MED ORDER — ONDANSETRON HCL 4 MG/2ML IJ SOLN: 4.0000 mg | Freq: Four times a day (QID) | INTRAMUSCULAR | Status: DC | PRN

## 2021-02-13 MED ORDER — SODIUM CHLORIDE 0.9% IV SOLUTION: Freq: Once | INTRAVENOUS | Status: AC

## 2021-02-13 MED ORDER — ACETAMINOPHEN 325 MG PO TABS
650.0000 mg | ORAL_TABLET | Freq: Four times a day (QID) | ORAL | Status: DC | PRN
  Administered 2021-02-23: 650 mg via ORAL
  Filled 2021-02-13 (×2): qty 2

## 2021-02-13 MED ORDER — FLUTICASONE PROPIONATE 50 MCG/ACT NA SUSP
2.0000 | Freq: Every day | NASAL | Status: DC
  Administered 2021-02-14 – 2021-02-25 (×12): 2 via NASAL
  Filled 2021-02-13: qty 16

## 2021-02-13 MED ORDER — PHYTONADIONE 5 MG PO TABS
2.5000 mg | ORAL_TABLET | Freq: Once | ORAL | Status: AC
  Administered 2021-02-13: 2.5 mg via ORAL
  Filled 2021-02-13: qty 1

## 2021-02-13 MED ORDER — LEVOTHYROXINE SODIUM 88 MCG PO TABS
88.0000 ug | ORAL_TABLET | Freq: Every day | ORAL | Status: DC
  Administered 2021-02-13 – 2021-02-25 (×12): 88 ug via ORAL
  Filled 2021-02-13 (×14): qty 1

## 2021-02-13 MED ORDER — METOPROLOL SUCCINATE ER 25 MG PO TB24: 25.0000 mg | ORAL_TABLET | Freq: Every day | ORAL | Status: DC

## 2021-02-13 NOTE — H&P (Addendum)
History and Physical  [name] HYW:[id number] DOB: June 07, 1920 DOA: 02/12/2021  Referring physician: Dr. Tomi Bamberger, [place]  PCP: Loel Dubonnet, NP  Outpatient Specialists: Cardiology Patient coming from: ALF.  Chief Complaint: Fall   HPI: [name] is a 87 y.o. male with medical history significant for hypothyroidism, chronic anxiety/depression, hypertension, permanent Afib on Coumadin,  chronic diastolic CHF, ambulatory dysfunction uses a walker, who presented to [facility] ED after a fall at ALF.  States he stood up from his chair then fell backwards.  Denies dizziness.  No cardiopulmonary symptoms.  This is the third time he falls in the past 3 months.  He was brought into the ED via EMS for further evaluation.  Work up in the ED revealed acute displaced fractures involving left superior and inferior pubic rami and acute or subacute mild compression fractures, T1 and T2.  EDP discussed case with ortho Dr. Marlou Sa who will see in consultation.  TRH, hospitalist team, asked to admit.  ED Course:  Temperature 98.2.  BP 101/56, pulse 97, respiration rate 21, O2 saturation 100% on room air.  Lab studies remarkable for serum sodium 126, potassium 4.2, bicarb 27, glucose 126, BUN 48, creatinine 2.10, anion gap 9, GFR 28, WBC 11.3, hemoglobin 10.9, platelet 188.  INR 3.2.  Review of Systems: Review of systems as noted in the HPI. All other systems reviewed and are negative.   History reviewed. No pertinent past medical history. History reviewed. No pertinent surgical history.  Social History:  has no history on file for tobacco use, alcohol use, and drug use.   Not on File  No family history on file.  Patient has 2 medical records that need to be merged.  Prior to Admission medications   Not on File    Physical Exam: BP 102/74   Pulse 86   Temp 98.2 F (36.8 C) (Oral)   Resp 17   Ht 5\' 7"  (1.702 m)   Wt 65 kg   SpO2 100%   BMI 22.44 kg/m   General: 87 y.o. year-old male well  developed well nourished in no acute distress.  Alert and oriented x3. Cardiovascular: Regular rate and rhythm with no rubs or gallops.  No thyromegaly or JVD noted.  No lower extremity edema. 2/4 pulses in all 4 extremities. Respiratory: Clear to auscultation with no wheezes or rales. Good inspiratory effort. Abdomen: Soft nontender nondistended with normal bowel sounds x4 quadrants. Muskuloskeletal: No cyanosis, clubbing or edema noted bilaterally Neuro: CN II-XII intact, strength, sensation, reflexes Skin: No ulcerative lesions noted or rashes Psychiatry: Judgement and insight appear normal. Mood is appropriate for condition and setting          Labs on Admission:  Basic Metabolic Panel: Recent Labs  Lab 02/12/21 2244  NA 126*  K 4.2  CL 90*  CO2 27  GLUCOSE 126*  BUN 48*  CREATININE 2.10*  CALCIUM 8.4*   Liver Function Tests: No results for input(s): AST, ALT, ALKPHOS, BILITOT, PROT, ALBUMIN in the last 168 hours. No results for input(s): LIPASE, AMYLASE in the last 168 hours. No results for input(s): AMMONIA in the last 168 hours. CBC: Recent Labs  Lab 02/12/21 2244  WBC 11.3*  HGB 10.9*  HCT 34.2*  MCV 94.0  PLT 188   Cardiac Enzymes: No results for input(s): CKTOTAL, CKMB, CKMBINDEX, TROPONINI in the last 168 hours.  BNP (last 3 results) No results for input(s): BNP in the last 8760 hours.  ProBNP (last 3 results) No results for  input(s): PROBNP in the last 8760 hours.  CBG: No results for input(s): GLUCAP in the last 168 hours.  Radiological Exams on Admission: DG Pelvis 1-2 Views  Result Date: 02/12/2021 CLINICAL DATA:  Trauma, fall EXAM: PELVIS - 1-2 VIEW COMPARISON:  12/31/2020 FINDINGS: SI joints are non widened. Both femoral heads project in joint. Acute displaced fractures involving the left superior and inferior pubic rami. IMPRESSION: Acute displaced fractures involving left superior and inferior pubic rami. Electronically Signed   By: Donavan Foil M.D.   On: 02/12/2021 22:56   CT Head Wo Contrast  Result Date: 02/12/2021 CLINICAL DATA:  Lost balance leading to fall this evening. On anticoagulation. EXAM: CT HEAD WITHOUT CONTRAST TECHNIQUE: Contiguous axial images were obtained from the base of the skull through the vertex without intravenous contrast. COMPARISON:  Head CT 12/31/2020 FINDINGS: Brain: Stable degree of atrophy and chronic small vessel ischemia. No intracranial hemorrhage, mass effect, or midline shift. No hydrocephalus. The basilar cisterns are patent. No evidence of territorial infarct or acute ischemia. No extra-axial or intracranial fluid collection. Vascular: Atherosclerosis of skullbase vasculature without hyperdense vessel or abnormal calcification. Skull: No fracture or focal lesion. Sinuses/Orbits: No acute findings.  Bilateral cataract resection. Other: Contracting right parietal scalp hematoma, diminished in size from prior. IMPRESSION: 1. No acute intracranial abnormality. No skull fracture. 2. Stable atrophy and chronic small vessel ischemia. Electronically Signed   By: Keith Rake M.D.   On: 02/12/2021 23:06   CT Cervical Spine Wo Contrast  Result Date: 02/12/2021 CLINICAL DATA:  Lost balance leading to fall seen knee. EXAM: CT CERVICAL SPINE WITHOUT CONTRAST TECHNIQUE: Multidetector CT imaging of the cervical spine was performed without intravenous contrast. Multiplanar CT image reconstructions were also generated. COMPARISON:  12/31/2020 FINDINGS: Alignment: Exaggerated cervical lordosis, unchanged from prior. No traumatic listhesis. Skull base and vertebrae: No acute cervical fracture. There is mild anterior wedging of T1 and T2 superior endplate, new from prior exam. Cervical vertebral body heights are maintained. The dens and skull base are intact. Soft tissues and spinal canal: No prevertebral fluid or swelling. No visible canal hematoma. Disc levels: Stable diffuse degenerative disc disease and multilevel  facet hypertrophy. Upper chest: Nonacute. Other: Carotid calcifications IMPRESSION: 1. No acute cervical spine fracture or subluxation. 2. Mild anterior wedging of T1 and T2 superior endplate, new from 12/31/2020 exam. Findings are suspicious for acute or subacute mild compression fractures. 3. Stable multilevel degenerative disc disease and facet hypertrophy. Electronically Signed   By: Keith Rake M.D.   On: 02/12/2021 23:11   DG Chest Portable 1 View  Result Date: 02/12/2021 CLINICAL DATA:  Level 2 trauma.  Fall. EXAM: PORTABLE CHEST 1 VIEW COMPARISON:  01/27/2021 FINDINGS: Postoperative changes in the mediastinum. Heart size and pulmonary vascularity are normal. Diffuse fine nodular interstitial pattern to the lungs is similar to prior study likely representing chronic interstitial lung disease. No developing consolidation, effusion, or pneumothorax. Degenerative changes in the spine and shoulders. IMPRESSION: Diffuse fine nodular interstitial pattern to the lungs is similar to prior study, likely chronic interstitial lung disease. Electronically Signed   By: Lucienne Capers M.D.   On: 02/12/2021 22:54    EKG: I independently viewed the EKG done and my findings are as followed: None available at the time of this dictation.  Assessment/Plan Present on Admission:  Fall  Active Problems:   Fall  Fall, unclear etiology Denies dizziness Obtain orthostatic vital signs PT OT assessment Fall precautions  Acute displaced fracture involving left superior  and inferior pubic rami, post fall, POA Pain control EDP consulted orthopedic surgery  Acute or subacute mild compression fractures involving T1 and T2 If symptomatic may consider IR consult for possible kyphoplasty Pain control  Hypovolemic hyponatremia Serum sodium 126 Start normal saline at 50 cc/h x 2 days Monitor serum sodium, no greater than 10 mEq of correction of serum sodium in a 24-hour period. Repeat BMP in the  morning  AKI Presented with creatinine of 2.10 and GFR of 28 Unclear baseline Repeat BMP in the morning  Anemia of chronic disease Hemoglobin 10.9, MCV 94. Monitor H&H  Hypothyroidism Resume home levothyroxine once home medications have been reconciled  Permanent A. fib on Eliquis On metoprolol for rate control.    DVT prophylaxis: Eliquis  Code Status: DNR  Family Communication: None at bedside  Disposition Plan: Admit to progressive unit  Consults called: None.  Admission status: Observation status.   Status is: Observation    Dispo:  Patient From: [facility]  Planned Disposition: ALF  Medically stable for discharge: No      Kayleen Memos MD [facility] Pager [phone]  If 7PM-7AM, please contact night-coverage [email or web address] Password [email or web address]  02/13/2021, 12:08 AM
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
## 2021-02-13 NOTE — ED Notes (Signed)
Admitting MD notified on patient's intermittent hypotension .
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
## 2021-02-13 NOTE — Progress Notes (Signed)
Per Dr Hal Hope ok for patient to transport without monitor or nurse.  Vitals taken, patient awake and alert.  Patient to CT.

## 2021-02-13 NOTE — Evaluation (Signed)
Physical Therapy Evaluation Patient Details Name: [name] MRN: [id number] DOB: May 12, 1920 Today's Date: 02/13/2021   History of Present Illness  This 87 y.o. male admitted after a fall at ALF (third fall in 3 months).  He sustained acute displaced fractures involving Lt superior and inferior pubic rami and acute subacute mild compression fractures T1 and T2.  Ortho consulted and pt ok for WBAT. PMH includes: hypothyroidism, chronic anxiety/depression, hypertension, permanent Afib on Coumadin,  chronic diastolic CHF  Clinical Impression  Pt admitted with above diagnosis. Pt seen in ED and was limited to EOB transfer due to height of stretcher (feet not reaching floor) and BP lower making unsafe to stand pt.  Normally, he resides at ALF and can ambulate with rollator in his room.  Today, required mod A of 2 for transfers.  Pt with soft BP and reported minimal dizziness - BP increased in sitting.   Pt currently with functional limitations due to the deficits listed below (see PT Problem List). Pt will benefit from skilled PT to increase their independence and safety with mobility to allow discharge to the venue listed below.       Follow Up Recommendations SNF    Equipment Recommendations  Rolling walker with 5" wheels;Wheelchair cushion (measurements PT);Wheelchair (measurements PT)    Recommendations for Other Services       Precautions / Restrictions Precautions Precautions: Fall Precaution Comments: h/o falls Restrictions Other Position/Activity Restrictions: WBAT      Mobility  Bed Mobility Overal bed mobility: Needs Assistance Bed Mobility: Supine to Sit;Sit to Supine     Supine to sit: Mod assist;+2 for physical assistance;+2 for safety/equipment;HOB elevated Sit to supine: Max assist;+2 for physical assistance;+2 for safety/equipment;HOB elevated   General bed mobility comments: assist to move LEs on and off the stretcher and to lift and lower his trunk     Transfers                 General transfer comment: Did not attempt stand due to pt in ED on tall stretcher and feet would not reach floor, in combination with weak and low BP, unsafe to stand  Ambulation/Gait                Stairs            Wheelchair Mobility    Modified Rankin (Stroke Patients Only)       Balance Overall balance assessment: Needs assistance Sitting-balance support: Feet supported Sitting balance-Leahy Scale: Fair Sitting balance - Comments: maimtained static sitting Edge of stretcher with min guard assist - step stool for feet       Standing balance comment: unable                             Pertinent Vitals/Pain Pain Assessment: 0-10 Pain Score: 10-Worst pain ever Pain Location: hip and buttocks Pain Descriptors / Indicators: Discomfort;Grimacing Pain Intervention(s): Limited activity within patient's tolerance;Monitored during session    Home Living Family/patient expects to be discharged to:: Max Hospital bed;Walker - 4 wheels;Grab bars - tub/shower;Shower seat Additional Comments: Pt resides in ALF with his [facility] at [facility]    Prior Function Level of Independence: Needs assistance   Gait / Transfers Assistance Needed: Uses rollator and could ambulate in room; if going further used scooter  ADL's / Homemaking Assistance Needed: Gets  dressed using adaptive equipment, I with toileting; had assist with showers  Comments: Pt was working with therapy at [facility]        Extremity/Trunk Assessment   Upper Extremity Assessment Upper Extremity Assessment: Generalized weakness    Lower Extremity Assessment Lower Extremity Assessment: LLE deficits/detail;RLE deficits/detail RLE Deficits / Details: ROM WFL; MMT ankle 5/5, knee 4/5, hip 1/5; pitting edema LLE Deficits / Details: ROM WFL; MMT ankle 5/5, knee 4/5, hip 1/5; pitting  edema    Cervical / Trunk Assessment Cervical / Trunk Assessment: Kyphotic  Communication   Communication: HOH  Cognition Arousal/Alertness: Awake/alert Behavior During Therapy: WFL for tasks assessed/performed Overall Cognitive Status: Within Functional Limits for tasks assessed                                 General Comments: grossly WFL for tasks assessed      General Comments General comments (skin integrity, edema, etc.): supine 82/58, sit after 7-8 mins 98/56, HR 110    Exercises     Assessment/Plan    PT Assessment Patient needs continued PT services  PT Problem List Decreased strength;Decreased mobility;Decreased safety awareness;Decreased range of motion;Decreased activity tolerance;Cardiopulmonary status limiting activity;Decreased balance;Decreased knowledge of use of DME;Pain       PT Treatment Interventions DME instruction;Therapeutic exercise;Gait training;Balance training;Neuromuscular re-education;Functional mobility training;Therapeutic activities;Patient/family education    PT Goals (Current goals can be found in the Care Plan section)  Acute Rehab PT Goals Patient Stated Goal: to have less pain PT Goal Formulation: With patient/family Time For Goal Achievement: 02/27/21 Potential to Achieve Goals: Good    Frequency Min 3X/week   Barriers to discharge        Co-evaluation PT/OT/SLP Co-Evaluation/Treatment: Yes Reason for Co-Treatment: For patient/therapist safety PT goals addressed during session: Mobility/safety with mobility OT goals addressed during session: ADL's and self-care       AM-PAC PT "6 Clicks" Mobility  Outcome Measure Help needed turning from your back to your side while in a flat bed without using bedrails?: A Little Help needed moving from lying on your back to sitting on the side of a flat bed without using bedrails?: Total Help needed moving to and from a bed to a chair (including a wheelchair)?: Total Help  needed standing up from a chair using your arms (e.g., wheelchair or bedside chair)?: Total Help needed to walk in hospital room?: Total Help needed climbing 3-5 steps with a railing? : Total 6 Click Score: 8    End of Session   Activity Tolerance: Patient tolerated treatment well Patient left: in bed;with call bell/phone within reach;with family/visitor present Nurse Communication: Mobility status PT Visit Diagnosis: Unsteadiness on feet (R26.81);Muscle weakness (generalized) (M62.81)    Time: 3825-0539 PT Time Calculation (min) (ACUTE ONLY): 23 min   Charges:   PT Evaluation $PT Eval Low Complexity: 1 Low          Signa Cheek, PT Acute Rehab Services Pager [phone] [facility] 02/13/2021, 2:19 PM

## 2021-02-13 NOTE — Progress Notes (Signed)
Orthopedic Tech Progress Note Patient Details:  [name] 1920/03/21 [id number]  Ortho Devices Type of Ortho Device: Haematologist Ortho Device/Splint Location: Bi LE Ortho Device/Splint Interventions: Application   Post Interventions Patient Tolerated: Well  [name] [name] 02/13/2021, 4:53 PM

## 2021-02-13 NOTE — Progress Notes (Signed)
[facility] Progress Note  Patient: [name]    GLO:[id number]  DOA: 02/12/2021     Date of Service: the patient was seen and examined on 02/13/2021  Brief hospital course: Past medical history of A. fib, HTN, HFpEF, anxiety, depression, hypothyroidism.  Presents with complaints of a fall.  He lost his balance and fell backwards without any loss of consciousness. Found to have a left superior and inferior pubic rami fracture as well as anemia. Currently plan is further work-up for anemia and pain control.  Subjective: No nausea no vomiting.  No fever no chills.  No chest pain or shortness of breath.  No diarrhea.  Reports left leg pain as well as pain in his bottom.  No back pain.  Assessment and Plan: 1.  Fall Lost his balance and fell. Denies any loss of consciousness. PT OT consulted. Has multiple risk factors to cause a syncopal event. Monitor on telemetry.  2.  Acute on chronic anemia Baseline hemoglobin around 10.9 based on the other chart. Currently on presentation hemoglobin 10.9. Hemoglobin now is 7.7. Likely dilutional drop. In the setting of hypotension as well as fall causing pubic fracture will rule out intra-abdominal bleeding. Holding Coumadin. Transfuse for 1 PRBC. Transfuse for hemoglobin less than 7 or hemodynamic instability.  3.  Acute on chronic systolic and diastolic CHF EF from May 1884 to 40 to 45%. LVH. Chronic A. fib.  No wall motion abnormality. Currently appears volume overloaded.  Sodium actually also on the lower side.  Serum creatinine at baseline.  Received IV fluids.  Blood pressure still soft. Unable to provide any diuretics right now. We will transfuse 1 PRBC and give 1 dose of 20 mg Lasix after that.  4.  Chronic kidney disease disease stage IIIb-4 Serum creatinine 2.10 on admission. Baseline around 2.0. Currently improving. Monitor and avoid nephrotoxic medications.  5.  Chronic A. fib. Currently rate controlled. On  metoprolol currently on hold due to hypotension. On Coumadin currently on hold due to anemia.  Monitor. If the CT abdomen pelvis is positive for bleeding or hemodynamics does not improve, will require reversal of warfarin with IV vitamin K .  6.  Hypothyroidism Continue Synthroid  7.  Hyponatremia. Currently improving. Likely hypervolemia. Monitor.  Scheduled Meds:  sodium chloride   Intravenous Once   clotrimazole  1 application Topical BID   docusate sodium  100 mg Oral BID   fluticasone  2 spray Each Nare Daily   levothyroxine  88 mcg Oral QAC breakfast   pantoprazole  40 mg Oral Daily   Continuous Infusions: PRN Meds: acetaminophen, ondansetron (ZOFRAN) IV, oxyCODONE  Body mass index is 27.01 kg/m.        DVT Prophylaxis:   SCDs Start: 02/13/21 0001    Advance goals of care discussion: Pt is DNR.  Family Communication: no family was present at bedside, at the time of interview.  Patient does not want to talk with his family member.  Data Reviewed: I have personally reviewed and interpreted daily labs, tele strips, imaging. Serum sodium 126 on admission.  Currently improving to 129. Procalcitonin level negative. Hemoglobin 10.9.  Currently is over 7. INR supratherapeutic.  Physical Exam:  General: Appear in mild distress, no Rash; Oral Mucosa Clear, moist. no Abnormal Neck Mass Or lumps, Conjunctiva normal  Cardiovascular: S1 and S2 Present, aortic systolic  Murmur, Respiratory: increased respiratory effort, Bilateral Air entry present and  bilateral  Crackles, no wheezes Abdomen: Bowel Sound present, Soft and no tenderness  Extremities: bilateral  Pedal edema Neurology: alert and oriented to time, place, and person affect appropriate. no new focal deficit Gait not checked due to patient safety concerns  Vitals:   02/13/21 1429 02/13/21 1435 02/13/21 1500 02/13/21 1600  BP:  (!) 94/50 104/68 (!) 93/55  Pulse:  96 87 (!) 103  Resp:  (!) 21 (!) 23 (!) 21   Temp:  98.5 F (36.9 C)    TempSrc:  Oral    SpO2:  97% 99% 99%  Weight: 75.9 kg     Height: 5\' 6"  (1.676 m)       Disposition:  Status is: Observation  Dispo:  Patient From: [facility]  Planned Disposition: ALF  Medically stable for discharge: No       Time spent: 35 minutes. I reviewed all nursing notes, pharmacy notes, vitals, pertinent old records. I have discussed plan of care as described above with RN.  Author: Berle Mull, MD [facility] 02/13/2021 4:54 PM  To reach On-call, see care teams to locate the attending and reach out via [email or web address]. Between 7PM-7AM, please contact night-coverage If you still have difficulty reaching the attending provider, please page the [facility] (Director on Call) for [facility] on amion for assistance.

## 2021-02-13 NOTE — ED Notes (Signed)
Pharmacy notified by RN to send patient's Synthroid.

## 2021-02-13 NOTE — Evaluation (Signed)
Occupational Therapy Evaluation Patient Details Name: [name] MRN: [id number] DOB: 03-Nov-1919 Today's Date: 02/13/2021    History of Present Illness This 87 y.o. male admitted after a fall at ALF (third fall in 3 months).  He sustained acute displaced fractures involving Lt superior and inferior pubic rami and acute subacute mild compression fractures T1 and T2.  Ortho consulted and pt ok for WBAT. PMH includes: hypothyroidism, chronic anxiety/depression, hypertension, permanent Afib on Coumadin,  chronic diastolic CHF   Clinical Impression   Pt admitted with above. He demonstrates the below listed deficits and will benefit from continued OT to maximize safety and independence with BADLs.  Pt presents to OT with generalized weakness, decreased activity tolerance, impaired balance, increased pain.  He currently requires min - mod A for UB ADLs and max A - total A for LB ADLs. He was residing at ALF with his wife and was mod I - min A for ADLs and ambulated with RW.  Recommend SNF level rehab.      Follow Up Recommendations  SNF    Equipment Recommendations  None recommended by OT    Recommendations for Other Services       Precautions / Restrictions Precautions Precautions: Fall Precaution Comments: h/o falls Restrictions Weight Bearing Restrictions: No Other Position/Activity Restrictions: WBAT      Mobility Bed Mobility Overal bed mobility: Needs Assistance Bed Mobility: Supine to Sit;Sit to Supine     Supine to sit: Mod assist;+2 for physical assistance;+2 for safety/equipment;HOB elevated Sit to supine: Max assist;+2 for physical assistance;+2 for safety/equipment;HOB elevated   General bed mobility comments: assist to move LEs on and off the stretcher and to lift and lower his trunk    Transfers                 General transfer comment: Did not attempt stand due to pt in ED on tall stretcher and feet would not reach floor, in combination with weak and  low BP, unsafe to stand    Balance Overall balance assessment: Needs assistance Sitting-balance support: Feet supported Sitting balance-Leahy Scale: Fair Sitting balance - Comments: maimtained static sitting Edge of stretcher with min guard assist - step stool for feet       Standing balance comment: unable                           ADL either performed or assessed with clinical judgement   ADL Overall ADL's : Needs assistance/impaired Eating/Feeding: Set up;Bed level;Sitting   Grooming: Wash/dry hands;Wash/dry face;Oral care;Set up;Sitting   Upper Body Bathing: Minimal assistance;Sitting   Lower Body Bathing: Maximal assistance;Sitting/lateral leans;Bed level   Upper Body Dressing : Moderate assistance;Sitting   Lower Body Dressing: Total assistance;Bed level;Sitting/lateral leans   Toilet Transfer: Total assistance Toilet Transfer Details (indicate cue type and reason): unable Toileting- Clothing Manipulation and Hygiene: Total assistance;Bed level               Vision Baseline Vision/History: Wears glasses Patient Visual Report: No change from baseline       Perception     Praxis      Pertinent Vitals/Pain Pain Assessment: 0-10 Pain Score: 10-Worst pain ever Pain Location: hip and buttocks Pain Descriptors / Indicators: Discomfort;Grimacing Pain Intervention(s): Limited activity within patient's tolerance;Monitored during session     Hand Dominance     Extremity/Trunk Assessment Upper Extremity Assessment Upper Extremity Assessment: Generalized weakness   Lower Extremity Assessment Lower Extremity Assessment: LLE  deficits/detail;RLE deficits/detail RLE Deficits / Details: ROM WFL; MMT ankle 5/5, knee 4/5, hip 1/5; pitting edema LLE Deficits / Details: ROM WFL; MMT ankle 5/5, knee 4/5, hip 1/5; pitting edema   Cervical / Trunk Assessment Cervical / Trunk Assessment: Kyphotic   Communication Communication Communication: HOH    Cognition Arousal/Alertness: Awake/alert Behavior During Therapy: WFL for tasks assessed/performed Overall Cognitive Status: Within Functional Limits for tasks assessed                                 General Comments: grossly WFL for tasks assessed   General Comments  supine 82/58, sit after 7-8 mins 98/56, HR 110    Exercises     Shoulder Instructions      Home Living Family/patient expects to be discharged to:: [facility] bed;Walker - 4 wheels;Grab bars - tub/shower;Shower seat   Additional Comments: Pt resides in ALF with his wife at [facility]      Prior Functioning/Environment Level of Independence: Needs assistance  Gait / Transfers Assistance Needed: Uses rollator and could ambulate in room; if going further used scooter ADL's / Homemaking Assistance Needed: Gets dressed using adaptive equipment, I with toileting; had assist with showers   Comments: Pt was working with therapy at ALF        OT Problem List: Decreased strength;Decreased activity tolerance;Impaired balance (sitting and/or standing);Decreased knowledge of use of DME or AE;Decreased knowledge of precautions;Cardiopulmonary status limiting activity;Pain;Increased edema      OT Treatment/Interventions:      OT Goals(Current goals can be found in the care plan section) Acute Rehab OT Goals Patient Stated Goal: to have less pain OT Goal Formulation: With patient/family Time For Goal Achievement: 02/27/21 Potential to Achieve Goals: Good ADL Goals Pt Will Perform Grooming: with mod assist;standing Pt Will Perform Upper Body Bathing: with set-up;sitting Pt Will Perform Lower Body Bathing: with mod assist;with adaptive equipment;sit to/from stand Pt Will Perform Upper Body Dressing: with set-up;sitting Pt Will Perform Lower Body Dressing: with mod assist;sit to/from stand;with adaptive equipment Pt Will  Transfer to Toilet: with mod assist;ambulating;regular height toilet;bedside commode Pt Will Perform Toileting - Clothing Manipulation and hygiene: with mod assist;sit to/from stand  OT Frequency: Min 2X/week   Barriers to D/C:            Co-evaluation PT/OT/SLP Co-Evaluation/Treatment: Yes Reason for Co-Treatment: For patient/therapist safety PT goals addressed during session: Mobility/safety with mobility OT goals addressed during session: ADL's and self-care      AM-PAC OT "6 Clicks" Daily Activity     Outcome Measure Help from another person eating meals?: A Little Help from another person taking care of personal grooming?: A Little Help from another person toileting, which includes using toliet, bedpan, or urinal?: Total Help from another person bathing (including washing, rinsing, drying)?: A Lot Help from another person to put on and taking off regular upper body clothing?: A Lot Help from another person to put on and taking off regular lower body clothing?: Total 6 Click Score: 12   End of Session Equipment Utilized During Treatment: Oxygen Nurse Communication: Mobility status  Activity Tolerance: Patient limited by pain Patient left: in bed;with call bell/phone within reach;with family/visitor present  OT Visit Diagnosis: Unsteadiness on feet (R26.81);Pain Pain -  Right/Left: Right Pain - part of body: Hip                Time: 4370-0525 OT Time Calculation (min): 19 min Charges:  OT General Charges $OT Visit: 1 Visit OT Evaluation $OT Eval Moderate Complexity: 1 Mod  Nilsa Nutting., OTR/L Acute Rehabilitation Services Pager [phone] Office [phone]   Lucille Passy M 02/13/2021, 3:10 PM

## 2021-02-13 NOTE — ED Notes (Signed)
Notified Dr. Posey Pronto of pt's trending lower BP's 87/56. Pt currently asymptomatic. No orders for bolus or maintenance due to patient already being in fluid overload with CHF hx. Received orders to draw blood work for possible blood tx. Also received orders for midodrine.

## 2021-02-13 NOTE — ED Notes (Signed)
Patient changed by this RN in new brief and placed in primofit.

## 2021-02-14 DIAGNOSIS — S7001XA Contusion of right hip, initial encounter: Secondary | ICD-10-CM

## 2021-02-14 DIAGNOSIS — N183 Chronic kidney disease, stage 3 unspecified: Secondary | ICD-10-CM

## 2021-02-14 DIAGNOSIS — D62 Acute posthemorrhagic anemia: Secondary | ICD-10-CM

## 2021-02-14 DIAGNOSIS — I482 Chronic atrial fibrillation, unspecified: Secondary | ICD-10-CM

## 2021-02-14 DIAGNOSIS — N179 Acute kidney failure, unspecified: Secondary | ICD-10-CM

## 2021-02-14 DIAGNOSIS — S32599A Other specified fracture of unspecified pubis, initial encounter for closed fracture: Secondary | ICD-10-CM

## 2021-02-14 DIAGNOSIS — N1832 Chronic kidney disease, stage 3b: Secondary | ICD-10-CM

## 2021-02-14 LAB — BASIC METABOLIC PANEL
Anion gap: 7 (ref 5–15)
Anion gap: 10 (ref 5–15)
BUN: 54 mg/dL — ABNORMAL HIGH (ref 8–23)
BUN: 54 mg/dL — ABNORMAL HIGH (ref 8–23)
CO2: 29 mmol/L (ref 22–32)
CO2: 28 mmol/L (ref 22–32)
Calcium: 8 mg/dL — ABNORMAL LOW (ref 8.9–10.3)
Calcium: 7.8 mg/dL — ABNORMAL LOW (ref 8.9–10.3)
Chloride: 90 mmol/L — ABNORMAL LOW (ref 98–111)
Chloride: 89 mmol/L — ABNORMAL LOW (ref 98–111)
Creatinine, Ser: 2.31 mg/dL — ABNORMAL HIGH (ref 0.61–1.24)
Creatinine, Ser: 2.19 mg/dL — ABNORMAL HIGH (ref 0.61–1.24)
GFR, Estimated: 25 mL/min — ABNORMAL LOW (ref >60)
GFR, Estimated: 27 mL/min — ABNORMAL LOW (ref >60)
Glucose, Bld: 167 mg/dL — ABNORMAL HIGH (ref 70–99)
Glucose, Bld: 146 mg/dL — ABNORMAL HIGH (ref 70–99)
Potassium: 4.1 mmol/L (ref 3.5–5.1)
Potassium: 4.2 mmol/L (ref 3.5–5.1)
Sodium: 126 mmol/L — ABNORMAL LOW (ref 135–145)
Sodium: 127 mmol/L — ABNORMAL LOW (ref 135–145)

## 2021-02-14 LAB — CBC
HCT: 22.3 % — ABNORMAL LOW (ref 39.0–52.0)
HCT: 21.4 % — ABNORMAL LOW (ref 39.0–52.0)
Hemoglobin: 7.5 g/dL — ABNORMAL LOW (ref 13.0–17.0)
Hemoglobin: 7.2 g/dL — ABNORMAL LOW (ref 13.0–17.0)
MCH: 30.2 pg (ref 26.0–34.0)
MCH: 30.1 pg (ref 26.0–34.0)
MCHC: 33.6 g/dL (ref 30.0–36.0)
MCHC: 33.6 g/dL (ref 30.0–36.0)
MCV: 89.9 fL (ref 80.0–100.0)
MCV: 89.5 fL (ref 80.0–100.0)
Platelets: 144 10*3/uL — ABNORMAL LOW (ref 150–400)
Platelets: 139 10*3/uL — ABNORMAL LOW (ref 150–400)
RBC: 2.48 MIL/uL — ABNORMAL LOW (ref 4.22–5.81)
RBC: 2.39 MIL/uL — ABNORMAL LOW (ref 4.22–5.81)
RDW: 14.8 % (ref 11.5–15.5)
RDW: 14.7 % (ref 11.5–15.5)
WBC: 11.5 10*3/uL — ABNORMAL HIGH (ref 4.0–10.5)
WBC: 11.5 10*3/uL — ABNORMAL HIGH (ref 4.0–10.5)
nRBC: 0 % (ref 0.0–0.2)
nRBC: 0 % (ref 0.0–0.2)

## 2021-02-14 LAB — OSMOLALITY: Osmolality: 286 mOsm/kg (ref 275–295)

## 2021-02-14 LAB — HEMOGLOBIN AND HEMATOCRIT, BLOOD
HCT: 19.6 % — ABNORMAL LOW (ref 39.0–52.0)
Hemoglobin: 6.7 g/dL — CL (ref 13.0–17.0)

## 2021-02-14 MED ORDER — SODIUM CHLORIDE 0.9 % IV BOLUS
500.0000 mL | Freq: Once | INTRAVENOUS | Status: AC
  Administered 2021-02-14: 500 mL via INTRAVENOUS

## 2021-02-14 MED ORDER — PHYTONADIONE 5 MG PO TABS
2.5000 mg | ORAL_TABLET | Freq: Once | ORAL | Status: AC
  Administered 2021-02-14: 2.5 mg via ORAL
  Filled 2021-02-14: qty 1

## 2021-02-14 NOTE — Progress Notes (Signed)
   02/14/21 0940  Therapy Vitals  BP (!) 90/49  Mobility  Activity Contraindicated/medical hold (BP soft + lethargy)  Mobility Response RN notified   Not appropriate for mobility at this time.

## 2021-02-14 NOTE — Progress Notes (Signed)
PROGRESS NOTE    [name]  JIR:[id number] DOB: [birth year] DOA: 02/12/2021 PCP: [name], [name], NP   Brief Narrative: [name] is a 87 y.o. male with a history of atrial fibrillation, hypertension, anxiety, depression, hypothyroidism. Patient presented secondary to a fall without LOC. He was found to have a pelvic fracture in addition to vertebral fractures. Unfortunately, he developed acute anemia in setting of a large hematoma likely from trauma and complicated by Coumadin use.   Assessment & Plan:   Principal Problem:   Closed fracture of multiple pubic rami [facility]) Active Problems:   Fall   Pelvic fracture (HCC)   Acute blood loss anemia   Traumatic hematoma of right hip   AKI (acute kidney injury) ([place])   CKD (chronic kidney disease), stage III (HCC)   Atrial fibrillation, chronic ([place])   Fall In setting of standing from seated position. No history consistent with syncopal episode. Patient suffered pelvic/thoracic vertebral fracture.  Pelvic fracture T1-T2 vertebral fractures Orthopedic surgery consulted. Recommendation for conservative management.  Acute blood loss anemia Hematoma CT abdomen/pelvis and physical exam consistent with large right hip hematoma. Patient is asymptomatic. Complicated by Coumadin use and INR of 3.2 on admission. Patient received 1 unit of PRBC on 7/7 with continued hemoglobin drift downward -Vitamin K 2.5 mg PO x1 -Trend CBC -Transfuse for hemoglobin <7 or recurrent hypotension  Hypotension In setting of blood loss anemia. Given midodrine x1. Today, 500 mL NS bolus given. History of reduced EF heart failure however, unlikely this is related decompensated heart failure/cardiogenic shock. -Watch BP -Transfuse as mentioned above -IV fluid use judiciously  Chronic systolic heart failure EF of 40% from prior Transthoracic Echocardiogram on chart review. BLE edema but overall not fluid overload. With acute blood loss, more volume  depleted at this time.  AKI on CKD stage IIIb Baseline creatinine of 1.9-2 from chart review. Slight increase of creatinine to 2.31 today possibly related to lasix last night. -BMP in AM  Chronic atrial fibrillation Patient is on Coumadin and metoprolol as an outpatient. Currently rate controlled. Metoprolol held secondary to hypotension. Coumadin held secondary to acute anemia.  Hypothyroidism -Continue Synthroid  Hyponatremia Sodium of 126 on admission with mild improvement. Now back to 126. No associated symptoms. -Repeat BMP/Serum osmolality -Urine sodium/osmolality/creatinine   DVT prophylaxis: SCDs Code Status:   Code Status: DNR Family Communication: Son-in-law via telephone per patient request (7 minutes) Disposition Plan: Discharge possibly back to ALF vs SNF in several days pending stabilization of hemoglobin/hematoma, eventual PT/OT recommendations   Consultants:  Orthopedic surgery  Procedures:  1 UNIT PRBC (02/13/2021)  Antimicrobials: None   Subjective: No leg pain. No concerns this morning. Did not enjoy breakfast.  Objective: Vitals:   02/13/21 2012 02/14/21 0000 02/14/21 0449 02/14/21 0730  BP: (!) 104/59 94/61 (!) 96/53   Pulse: (!) 105  61   Resp: 19 (!) 21 15   Temp: 98.1 F (36.7 C)  97.7 F (36.5 C) 97.9 F (36.6 C)  TempSrc: Oral  Oral Oral  SpO2: 96%  99%   Weight:   76.2 kg   Height:        Intake/Output Summary (Last 24 hours) at 02/14/2021 0921 Last data filed at 02/14/2021 0837 Gross per 24 hour  Intake 790 ml  Output 800 ml  Net -10 ml   Filed Weights   02/12/21 2238 02/13/21 1429 02/14/21 0449  Weight: 65 kg 75.9 kg 76.2 kg    Examination:  General exam: Appears calm  and comfortable Respiratory system: Clear to auscultation. Respiratory effort normal. Cardiovascular system: S1 & S2 heard, irregular rhythm, normal rate. No murmurs, rubs, gallops or clicks. Gastrointestinal system: Abdomen is nondistended, soft and nontender.  No organomegaly or masses felt. Normal bowel sounds heard. Central nervous system: Alert and oriented. No focal neurological deficits. Musculoskeletal: No edema. No calf tenderness Skin: No cyanosis. Right hip ecchymosis noted with underlying hematoma felt Psychiatry: Judgement and insight appear normal. Mood & affect appropriate.     Data Reviewed: I have personally reviewed following labs and imaging studies  CBC Lab Results  Component Value Date   WBC 11.5 (H) 02/14/2021   RBC 2.48 (L) 02/14/2021   HGB 7.5 (L) 02/14/2021   HCT 22.3 (L) 02/14/2021   MCV 89.9 02/14/2021   MCH 30.2 02/14/2021   PLT 144 (L) 02/14/2021   MCHC 33.6 02/14/2021   RDW 14.8 02/14/2021   LYMPHSABS 0.6 (L) 02/13/2021   MONOABS 0.9 02/13/2021   EOSABS 0.0 02/13/2021   BASOSABS 0.0 07/37/1062     Last metabolic panel Lab Results  Component Value Date   NA 129 (L) 02/13/2021   K 3.6 02/13/2021   CL 94 (L) 02/13/2021   CO2 24 02/13/2021   BUN 44 (H) 02/13/2021   CREATININE 1.88 (H) 02/13/2021   GLUCOSE 127 (H) 02/13/2021   GFRNONAA 32 (L) 02/13/2021   CALCIUM 7.3 (L) 02/13/2021   PROT 4.4 (L) 02/13/2021   ALBUMIN 2.3 (L) 02/13/2021   BILITOT 1.1 02/13/2021   ALKPHOS 100 02/13/2021   AST 22 02/13/2021   ALT 13 02/13/2021   ANIONGAP 11 02/13/2021    CBG (last 3)  No results for input(s): GLUCAP in the last 72 hours.   GFR: Estimated Creatinine Clearance: 19.8 mL/min (A) (by C-G formula based on SCr of 1.88 mg/dL (H)).  Coagulation Profile: Recent Labs  Lab 02/12/21 2244 02/13/21 1012  INR 3.2* 3.4*    Recent Results (from the past 240 hour(s))  Resp Panel by RT-PCR (Flu A&B, Covid) Nasopharyngeal Swab     Status: None   Collection Time: 02/12/21 11:27 PM   Specimen: Nasopharyngeal Swab; Nasopharyngeal(NP) swabs in vial transport medium  Result Value Ref Range Status   SARS Coronavirus 2 by RT PCR NEGATIVE NEGATIVE Final    Comment: (NOTE) SARS-CoV-2 target nucleic acids are NOT  DETECTED.  The SARS-CoV-2 RNA is generally detectable in upper respiratory specimens during the acute phase of infection. The lowest concentration of SARS-CoV-2 viral copies this assay can detect is 138 copies/mL. A negative result does not preclude SARS-Cov-2 infection and should not be used as the sole basis for treatment or other patient management decisions. A negative result may occur with  improper specimen collection/handling, submission of specimen other than nasopharyngeal swab, presence of viral mutation(s) within the areas targeted by this assay, and inadequate number of viral copies(<138 copies/mL). A negative result must be combined with clinical observations, patient history, and epidemiological information. The expected result is Negative.  Fact Sheet for Patients:  EntrepreneurPulse.com.au  Fact Sheet for Healthcare Providers:  IncredibleEmployment.be  This test is no t yet approved or cleared by the [place] FDA and  has been authorized for detection and/or diagnosis of SARS-CoV-2 by FDA under an Emergency Use Authorization (EUA). This EUA will remain  in effect (meaning this test can be used) for the duration of the COVID-19 declaration under Section 564(b)(1) of the Act, 21 U.S.C.section 360bbb-3(b)(1), unless the authorization is terminated  or revoked sooner.  Influenza A by PCR NEGATIVE NEGATIVE Final   Influenza B by PCR NEGATIVE NEGATIVE Final    Comment: (NOTE) The Xpert Xpress SARS-CoV-2/FLU/RSV plus assay is intended as an aid in the diagnosis of influenza from Nasopharyngeal swab specimens and should not be used as a sole basis for treatment. Nasal washings and aspirates are unacceptable for Xpert Xpress SARS-CoV-2/FLU/RSV testing.  Fact Sheet for Patients: EntrepreneurPulse.com.au  Fact Sheet for Healthcare Providers: IncredibleEmployment.be  This test is not yet  approved or cleared by the [place] FDA and has been authorized for detection and/or diagnosis of SARS-CoV-2 by FDA under an Emergency Use Authorization (EUA). This EUA will remain in effect (meaning this test can be used) for the duration of the COVID-19 declaration under Section 564(b)(1) of the Act, 21 U.S.C. section 360bbb-3(b)(1), unless the authorization is terminated or revoked.  Performed at [facility], [place] [address] [place], [place] [address]         Radiology Studies: CT ABDOMEN PELVIS WO CONTRAST  Result Date: 02/13/2021 CLINICAL DATA:  Anemia.  To assess for abdominal bleed. EXAM: CT ABDOMEN AND PELVIS WITHOUT CONTRAST TECHNIQUE: Multidetector CT imaging of the abdomen and pelvis was performed following the standard protocol without IV contrast. COMPARISON:  None. FINDINGS: Lower chest: Small bilateral pleural effusions. Coarse peripheral infiltrates with peribronchial thickening likely representing chronic interstitial lung disease. Postoperative changes in the mediastinum. Diffuse cardiac enlargement. Hepatobiliary: No focal liver abnormality is seen. No gallstones, gallbladder wall thickening, or biliary dilatation. Pancreas: Unremarkable. No pancreatic ductal dilatation or surrounding inflammatory changes. Spleen: Normal in size without focal abnormality. Adrenals/Urinary Tract: Adrenal glands are unremarkable. Kidneys are normal, without renal calculi, focal lesion, or hydronephrosis. Bladder is unremarkable. Stomach/Bowel: Stomach, small bowel, and colon are mostly decompressed with scattered stool in the colon. No wall thickening or inflammatory changes are appreciated. Appendix is not identified. Vascular/Lymphatic: Aortic atherosclerosis. No enlarged abdominal or pelvic lymph nodes. Reproductive: Prostate is unremarkable. Other: No free air or free fluid in the abdomen. Abdominal wall musculature appears intact. There is diffuse edema throughout the subcutaneous  soft tissues. In the subcutaneous fat lateral to the right hip, there is a heterogeneous collection with mixed density material measuring 7.1 x 10.1 by 15.5 cm. This is consistent with a soft tissue hematoma. Musculoskeletal: Degenerative changes in the spine and hips. No destructive bone lesions. Acute appearing displaced fractures of the left superior and inferior pubic rami and of the right anterior acetabular rim. IMPRESSION: 1. Large Mets density collection lateral to the right hip consistent with subcutaneous soft tissue hematoma. 2. Acute appearing fractures of the right anterior acetabulum and left superior and inferior pubic rami. 3. Diffuse soft tissue edema. 4. Coarse interstitial changes and bronchial thickening in the lung bases suggesting chronic interstitial lung disease. 5. Diffuse cardiac enlargement 6. Moderate aortic atherosclerosis. Electronically Signed   By: Lucienne Capers M.D.   On: 02/13/2021 20:43   DG Pelvis 1-2 Views  Result Date: 02/12/2021 CLINICAL DATA:  Trauma, fall EXAM: PELVIS - 1-2 VIEW COMPARISON:  12/31/2020 FINDINGS: SI joints are non widened. Both femoral heads project in joint. Acute displaced fractures involving the left superior and inferior pubic rami. IMPRESSION: Acute displaced fractures involving left superior and inferior pubic rami. Electronically Signed   By: Donavan Foil M.D.   On: 02/12/2021 22:56   CT Head Wo Contrast  Result Date: 02/12/2021 CLINICAL DATA:  Lost balance leading to fall this evening. On anticoagulation. EXAM: CT HEAD WITHOUT CONTRAST TECHNIQUE: Contiguous  axial images were obtained from the base of the skull through the vertex without intravenous contrast. COMPARISON:  Head CT 12/31/2020 FINDINGS: Brain: Stable degree of atrophy and chronic small vessel ischemia. No intracranial hemorrhage, mass effect, or midline shift. No hydrocephalus. The basilar cisterns are patent. No evidence of territorial infarct or acute ischemia. No extra-axial  or intracranial fluid collection. Vascular: Atherosclerosis of skullbase vasculature without hyperdense vessel or abnormal calcification. Skull: No fracture or focal lesion. Sinuses/Orbits: No acute findings.  Bilateral cataract resection. Other: Contracting right parietal scalp hematoma, diminished in size from prior. IMPRESSION: 1. No acute intracranial abnormality. No skull fracture. 2. Stable atrophy and chronic small vessel ischemia. Electronically Signed   By: Keith Rake M.D.   On: 02/12/2021 23:06   CT Cervical Spine Wo Contrast  Result Date: 02/12/2021 CLINICAL DATA:  Lost balance leading to fall seen knee. EXAM: CT CERVICAL SPINE WITHOUT CONTRAST TECHNIQUE: Multidetector CT imaging of the cervical spine was performed without intravenous contrast. Multiplanar CT image reconstructions were also generated. COMPARISON:  12/31/2020 FINDINGS: Alignment: Exaggerated cervical lordosis, unchanged from prior. No traumatic listhesis. Skull base and vertebrae: No acute cervical fracture. There is mild anterior wedging of T1 and T2 superior endplate, new from prior exam. Cervical vertebral body heights are maintained. The dens and skull base are intact. Soft tissues and spinal canal: No prevertebral fluid or swelling. No visible canal hematoma. Disc levels: Stable diffuse degenerative disc disease and multilevel facet hypertrophy. Upper chest: Nonacute. Other: Carotid calcifications IMPRESSION: 1. No acute cervical spine fracture or subluxation. 2. Mild anterior wedging of T1 and T2 superior endplate, new from 12/31/2020 exam. Findings are suspicious for acute or subacute mild compression fractures. 3. Stable multilevel degenerative disc disease and facet hypertrophy. Electronically Signed   By: Keith Rake M.D.   On: 02/12/2021 23:11   DG Chest Portable 1 View  Result Date: 02/12/2021 CLINICAL DATA:  Level 2 trauma.  Fall. EXAM: PORTABLE CHEST 1 VIEW COMPARISON:  01/27/2021 FINDINGS: Postoperative  changes in the mediastinum. Heart size and pulmonary vascularity are normal. Diffuse fine nodular interstitial pattern to the lungs is similar to prior study likely representing chronic interstitial lung disease. No developing consolidation, effusion, or pneumothorax. Degenerative changes in the spine and shoulders. IMPRESSION: Diffuse fine nodular interstitial pattern to the lungs is similar to prior study, likely chronic interstitial lung disease. Electronically Signed   By: Lucienne Capers M.D.   On: 02/12/2021 22:54        Scheduled Meds:  clotrimazole  1 application Topical BID   docusate sodium  100 mg Oral BID   fluticasone  2 spray Each Nare Daily   levothyroxine  88 mcg Oral QAC breakfast   pantoprazole  40 mg Oral Daily   Continuous Infusions:   LOS: 1 day     Cordelia Poche, MD [facility] 02/14/2021, 9:21 AM  If 7PM-7AM, please contact night-coverage [email or web address]

## 2021-02-14 NOTE — TOC Initial Note (Addendum)
Transition of Care [facility]) - Initial/Assessment Note    Patient Details  Name: [name] MRN: [id number] Date of Birth: 1920-02-16  Transition of Care [facility]) CM/SW Contact:    Tresa Endo Phone Number: 02/14/2021, 9:36 AM  Clinical Narrative:                 CSW received SNF consult. CSW contacted pt daughter. CSW introduced self and explained role at the hospital. Pt reports that PTA the pt was Living at [facility] with his wife but recently went to [facility] for rehab. PT reports pt is mod/max 2+ assistance for  mobility. PT also reports Homemaking Assistance Needed: Gets dressed using adaptive equipment, with toileting; had assist with showers.  CSW reviewed PT/OT recommendations for SNF. Pt daughter reports being knowledgeable about pt needing SNF bc that is where he recently was. Pt daughter gave CSW permission to fax out to previous facility. No COVID vaccines on file.  CSW will continue to follow.   Patient states their goals for this hospitalization and ongoing recovery are:: Rest CMS Medicare.gov Compare Post Acute Care list provided to:: Patient Choice offered to / list presented to : Patient   Expected Discharge Plan and Services In-house Referral: Clinical Social Work Living arrangements for the past 2 months: [facility]                         Prior Living Arrangements/Services Living arrangements for the past 2 months:  Lives with:: Staff/Residents  Patient language and need for interpreter reviewed:: Yes Do you feel safe going back to the place where you live?: Yes      Need for Family Participation in Patient Care: Yes  Care giver support system in place?: Patients daughter and staff at [facility] Current home services: N/A Criminal Activity/Legal Involvement Pertinent to Current Situation/Hospitalization: No    Activities of Daily Living      Permission Sought/Granted Permission sought to share information with : Facility Automotive engineer and patient daughter         Emotional Assessment   N/A          Admission diagnosis:  Pelvic fracture ([place]) [S32.9XXA] Fall [W19.XXXA] Fall, initial encounter [id number]] Closed fracture of multiple pubic rami, left, initial encounter ([place]) [S32.592A] Closed wedge compression fracture of T1 vertebra, initial encounter ([place]) [S22.010A] Closed fracture of multiple pubic rami [facility]) [S32.599A] Patient Active Problem List   Diagnosis Date Noted   Pelvic fracture ([place]) 02/13/2021   Closed fracture of multiple pubic rami ([place]) 02/13/2021   Fall 02/12/2021   PCP:  [name], [name], NP Pharmacy:  No Pharmacies Listed    Social Determinants of Health (SDOH) Interventions    Readmission Risk Interventions No flowsheet data found.

## 2021-02-14 NOTE — TOC CAGE-AID Note (Signed)
Transition of Care [facility]) - CAGE-AID Screening   Patient Details  Name: [name] MRN: [id number] Date of Birth: Feb 09, 1920  Transition of Care [facility]) CM/SW Contact:    Gates Jividen C Tarpley-Carter, [place] Phone Number: 02/14/2021, 11:00 AM   Clinical Narrative: Pt is unable to participate in Cage Aid (Altered Mental Status).   Imara Standiford Tarpley-Carter, MSW, LCSW-A Pronouns:  She/Her/Hers                          [place] Clinical Social WorkerTransitions of Care Cell:  [phone] [email or web address]   CAGE-AID Screening: Substance Abuse Screening unable to be completed due to: : Patient unable to participate

## 2021-02-14 NOTE — NC FL2 (Addendum)
[place] LEVEL OF CARE SCREENING TOOL     IDENTIFICATION  Patient Name: [name] Birthdate: 1920/03/18 Sex: male Admission Date (Current Location): 02/12/2021  [facility] and [place] Number:  Herbalist and Address:  [facility] [place] [place] [address] [place], [place] [address]      Provider Number: [id number]  Attending Physician Name and Address:  Mariel Aloe, MD  Relative Name and Phone Number:  Crecencio Mc (Daughter)   [phone]    Current Level of Care: Hospital Recommended Level of Care: [place] Prior Approval Number:    Date Approved/Denied:   PASRR Number: [id number]   Discharge Plan: SNF    Current Diagnoses: Patient Active Problem List   Diagnosis Date Noted   Pelvic fracture ([place]) 02/13/2021   Closed fracture of multiple pubic rami ([place]) 02/13/2021   Fall 02/12/2021    Orientation RESPIRATION BLADDER Height & Weight     Self, Situation, Place  O2 (2L Nasal Cannula) Continent, External catheter Weight: 167 lb 15.9 oz (76.2 kg) Height:  5\' 6"  (167.6 cm)  BEHAVIORAL SYMPTOMS/MOOD NEUROLOGICAL BOWEL NUTRITION STATUS      Continent Diet (See DC Summary)  AMBULATORY STATUS COMMUNICATION OF NEEDS Skin   Extensive Assist Verbally Normal                       Personal Care Assistance Level of Assistance  Bathing, Feeding, Dressing Bathing Assistance: Maximum assistance Feeding assistance: Independent Dressing Assistance: Maximum assistance     Functional Limitations Info  Sight, Hearing, Speech Sight Info: Impaired Hearing Info: Adequate Speech Info: Adequate    SPECIAL CARE FACTORS FREQUENCY  PT (By licensed PT), OT (By licensed OT)     PT Frequency: 5x a week OT Frequency: 5x a week            Contractures Contractures Info: Not present    Additional Factors Info  Code Status, Allergies Code Status Info: DNR Allergies Info: Ambien (zolpidem), Penicillins            Current Medications (02/14/2021):  This is the current hospital active medication list Current Facility-Administered Medications  Medication Dose Route Frequency Provider Last Rate Last Admin   acetaminophen (TYLENOL) tablet 650 mg  650 mg Oral Q6H PRN Lavina Hamman, MD       clotrimazole (LOTRIMIN) 1 % cream 1 application  1 application Topical BID Lavina Hamman, MD   1 application at 55/73/22 2319   docusate sodium (COLACE) capsule 100 mg  100 mg Oral BID Lavina Hamman, MD   100 mg at 02/13/21 2113   fluticasone (FLONASE) 50 MCG/ACT nasal spray 2 spray  2 spray Each Nare Daily Lavina Hamman, MD       levothyroxine (SYNTHROID) tablet 88 mcg  88 mcg Oral QAC breakfast Kayleen Memos, DO   88 mcg at 02/14/21 0513   ondansetron (ZOFRAN) injection 4 mg  4 mg Intravenous Q6H PRN Lavina Hamman, MD       oxyCODONE (Oxy IR/ROXICODONE) immediate release tablet 5 mg  5 mg Oral Q4H PRN Lavina Hamman, MD   5 mg at 02/13/21 2113   pantoprazole (PROTONIX) EC tablet 40 mg  40 mg Oral Daily Irene Pap N, DO   40 mg at 02/13/21 1013     Discharge Medications: Please see discharge summary for a list of discharge medications.  Relevant Imaging Results:  Relevant Lab Results:   Additional  Information SSN: [id number]  Reece Agar, LCSWA

## 2021-02-15 LAB — BASIC METABOLIC PANEL
Anion gap: 7 (ref 5–15)
BUN: 55 mg/dL — ABNORMAL HIGH (ref 8–23)
CO2: 30 mmol/L (ref 22–32)
Calcium: 7.9 mg/dL — ABNORMAL LOW (ref 8.9–10.3)
Chloride: 90 mmol/L — ABNORMAL LOW (ref 98–111)
Creatinine, Ser: 2.24 mg/dL — ABNORMAL HIGH (ref 0.61–1.24)
GFR, Estimated: 26 mL/min — ABNORMAL LOW (ref >60)
Glucose, Bld: 116 mg/dL — ABNORMAL HIGH (ref 70–99)
Potassium: 4.1 mmol/L (ref 3.5–5.1)
Sodium: 127 mmol/L — ABNORMAL LOW (ref 135–145)

## 2021-02-15 LAB — HEMOGLOBIN AND HEMATOCRIT, BLOOD
HCT: 22.1 % — ABNORMAL LOW (ref 39.0–52.0)
HCT: 22 % — ABNORMAL LOW (ref 39.0–52.0)
HCT: 21.6 % — ABNORMAL LOW (ref 39.0–52.0)
Hemoglobin: 7.5 g/dL — ABNORMAL LOW (ref 13.0–17.0)
Hemoglobin: 7.3 g/dL — ABNORMAL LOW (ref 13.0–17.0)
Hemoglobin: 7.1 g/dL — ABNORMAL LOW (ref 13.0–17.0)

## 2021-02-15 LAB — CBC
HCT: 19.5 % — ABNORMAL LOW (ref 39.0–52.0)
Hemoglobin: 6.5 g/dL — CL (ref 13.0–17.0)
MCH: 29.7 pg (ref 26.0–34.0)
MCHC: 33.3 g/dL (ref 30.0–36.0)
MCV: 89 fL (ref 80.0–100.0)
Platelets: 142 10*3/uL — ABNORMAL LOW (ref 150–400)
RBC: 2.19 MIL/uL — ABNORMAL LOW (ref 4.22–5.81)
RDW: 14.6 % (ref 11.5–15.5)
WBC: 11.8 10*3/uL — ABNORMAL HIGH (ref 4.0–10.5)
nRBC: 0 % (ref 0.0–0.2)

## 2021-02-15 LAB — PROTIME-INR
INR: 2.1 — ABNORMAL HIGH (ref 0.8–1.2)
Prothrombin Time: 23.1 seconds — ABNORMAL HIGH (ref 11.4–15.2)

## 2021-02-15 LAB — PREPARE RBC (CROSSMATCH)

## 2021-02-15 MED ORDER — SODIUM CHLORIDE 0.9% IV SOLUTION: Freq: Once | INTRAVENOUS | Status: AC

## 2021-02-15 NOTE — Progress Notes (Addendum)
Pt sat down in the side of the bed, dangled and stand up for 5 minutes, pt could not move the feet to transfer to the chair, but pt tolerated dangling and sitting at the side of the bed for 2 hours, pt denies CP, distress and dyspnea. Will continue to monitor.  Dr. Lonny Prude informed about recent HB 7.3 and no new orders    Palma Holter, RN

## 2021-02-15 NOTE — Progress Notes (Signed)
Blood transfusion started , no s/s of reaction noted. Will continue to monitor.

## 2021-02-15 NOTE — Progress Notes (Signed)
Blood transfusion completed without any reaction. 

## 2021-02-15 NOTE — Progress Notes (Signed)
Hgb dropped from 7.2 to 6.7, Notified Dr. Hal Hope notified and received and order to transfused 1 unit of PRBC. Will continue to monitor.

## 2021-02-15 NOTE — Progress Notes (Signed)
PROGRESS NOTE    [name]  TWS:[id number] DOB: 11-Dec-1919 DOA: 02/12/2021 PCP: [name], [name], NP   Brief Narrative: [name] is a 87 y.o. male with a history of atrial fibrillation, hypertension, anxiety, depression, hypothyroidism. Patient presented secondary to a fall without LOC. He was found to have a pelvic fracture in addition to vertebral fractures. Unfortunately, he developed acute anemia in setting of a large hematoma likely from trauma and complicated by Coumadin use.   Assessment & Plan:   Principal Problem:   Closed fracture of multiple pubic rami [facility]) Active Problems:   Fall   Pelvic fracture (HCC)   Acute blood loss anemia   Traumatic hematoma of right hip   AKI (acute kidney injury) ([place])   CKD (chronic kidney disease), stage III (HCC)   Atrial fibrillation, chronic ([place])   Fall In setting of standing from seated position. No history consistent with syncopal episode. Patient suffered pelvic/thoracic vertebral fracture.  Pelvic fracture T1-T2 vertebral fractures Orthopedic surgery consulted. Recommendation for conservative management.  Acute blood loss anemia Hematoma CT abdomen/pelvis and physical exam consistent with large right hip hematoma. Patient is asymptomatic. Complicated by Coumadin use and INR of 3.2 on admission. Patient received 1 unit of PRBC on 7/7 with continued hemoglobin drift downward. Vitamin K given on 7/8 and required another 1 unit PRBC transfusion on 7/8 -Trend CBC/H&H -Transfuse for hemoglobin <7 or recurrent hypotension  Hypotension In setting of blood loss anemia. Given midodrine x1. Given a 500 mL NS bolus on 7/8. History of reduced EF heart failure however, unlikely this is related decompensated heart failure/cardiogenic shock. Currently stable. -Watch BP -IV fluid use judiciously  Chronic systolic heart failure EF of 40% from prior Transthoracic Echocardiogram on chart review. BLE edema but overall not fluid  overload. With acute blood loss, more volume depleted at this time.  AKI on CKD stage IIIb Baseline creatinine of 1.9-2 from chart review. Slight increase of creatinine to 2.31 today possibly related to lasix last night. -BMP in AM  Chronic atrial fibrillation Patient is on Coumadin and metoprolol as an outpatient. Currently rate controlled. Metoprolol held secondary to hypotension. Coumadin held secondary to acute anemia.  Hypothyroidism -Continue Synthroid  Hyponatremia Sodium of 126 on admission with mild improvement. Now back to 126. No associated symptoms. Serum osmolality of 286 -Daily BMP -Urine sodium/osmolality/creatinine   DVT prophylaxis: SCDs Code Status:   Code Status: DNR Family Communication: Son-in-law via telephone per patient request (7/9) Disposition Plan: Discharge possibly back to SNF in several days pending stabilization of hemoglobin/hematoma, eventual PT/OT recommendations   Consultants:  Orthopedic surgery  Procedures:  PRBC transfusion (7/7; 7/8)  Antimicrobials: None   Subjective: No concerns this morning except wanting to ambulate.  Objective: Vitals:   02/15/21 0239 02/15/21 0240 02/15/21 0523 02/15/21 0800  BP: 93/61 93/61 (!) 98/56 (!) 103/56  Pulse: 61 61 (!) 103 (!) 103  Resp: 16 16 19 20   Temp: 98.3 F (36.8 C) 98.3 F (36.8 C) 98.5 F (36.9 C) 99 F (37.2 C)  TempSrc: Oral  Oral Oral  SpO2: 100% 100% 99% 100%  Weight:      Height:        Intake/Output Summary (Last 24 hours) at 02/15/2021 0950 Last data filed at 02/15/2021 0816 Gross per 24 hour  Intake 1102 ml  Output 450 ml  Net 652 ml    Filed Weights   02/13/21 1429 02/14/21 0449 02/15/21 0027  Weight: 75.9 kg 76.2 kg 77.9 kg  Examination:  General exam: Appears calm and comfortable Respiratory system: Clear to auscultation. Respiratory effort normal. Cardiovascular system: S1 & S2 heard, irregular rhythm, normal rate. No murmurs, rubs, gallops or  clicks. Gastrointestinal system: Abdomen is nondistended, soft and nontender. No organomegaly or masses felt. Normal bowel sounds heard. Central nervous system: Alert and oriented. No focal neurological deficits. Musculoskeletal: No edema. No calf tenderness Skin: No cyanosis. Right hip ecchymosis noted with underlying hematoma felt Psychiatry: Judgement and insight appear normal. Mood & affect appropriate.     Data Reviewed: I have personally reviewed following labs and imaging studies  CBC Lab Results  Component Value Date   WBC 11.8 (H) 02/15/2021   RBC 2.19 (L) 02/15/2021   HGB 7.5 (L) 02/15/2021   HCT 22.1 (L) 02/15/2021   MCV 89.0 02/15/2021   MCH 29.7 02/15/2021   PLT 142 (L) 02/15/2021   MCHC 33.3 02/15/2021   RDW 14.6 02/15/2021   LYMPHSABS 0.6 (L) 02/13/2021   MONOABS 0.9 02/13/2021   EOSABS 0.0 02/13/2021   BASOSABS 0.0 54/04/8118     Last metabolic panel Lab Results  Component Value Date   NA 127 (L) 02/15/2021   K 4.1 02/15/2021   CL 90 (L) 02/15/2021   CO2 30 02/15/2021   BUN 55 (H) 02/15/2021   CREATININE 2.24 (H) 02/15/2021   GLUCOSE 116 (H) 02/15/2021   GFRNONAA 26 (L) 02/15/2021   CALCIUM 7.9 (L) 02/15/2021   PROT 4.4 (L) 02/13/2021   ALBUMIN 2.3 (L) 02/13/2021   BILITOT 1.1 02/13/2021   ALKPHOS 100 02/13/2021   AST 22 02/13/2021   ALT 13 02/13/2021   ANIONGAP 7 02/15/2021    CBG (last 3)  No results for input(s): GLUCAP in the last 72 hours.   GFR: Estimated Creatinine Clearance: 18.1 mL/min (A) (by C-G formula based on SCr of 2.24 mg/dL (H)).  Coagulation Profile: Recent Labs  Lab 02/12/21 2244 02/13/21 1012 02/15/21 0141  INR 3.2* 3.4* 2.1*     Recent Results (from the past 240 hour(s))  Resp Panel by RT-PCR (Flu A&B, Covid) Nasopharyngeal Swab     Status: None   Collection Time: 02/12/21 11:27 PM   Specimen: Nasopharyngeal Swab; Nasopharyngeal(NP) swabs in vial transport medium  Result Value Ref Range Status   SARS  Coronavirus 2 by RT PCR NEGATIVE NEGATIVE Final    Comment: (NOTE) SARS-CoV-2 target nucleic acids are NOT DETECTED.  The SARS-CoV-2 RNA is generally detectable in upper respiratory specimens during the acute phase of infection. The lowest concentration of SARS-CoV-2 viral copies this assay can detect is 138 copies/mL. A negative result does not preclude SARS-Cov-2 infection and should not be used as the sole basis for treatment or other patient management decisions. A negative result may occur with  improper specimen collection/handling, submission of specimen other than nasopharyngeal swab, presence of viral mutation(s) within the areas targeted by this assay, and inadequate number of viral copies(<138 copies/mL). A negative result must be combined with clinical observations, patient history, and epidemiological information. The expected result is Negative.  Fact Sheet for Patients:  EntrepreneurPulse.com.au  Fact Sheet for Healthcare Providers:  IncredibleEmployment.be  This test is no t yet approved or cleared by the [place] FDA and  has been authorized for detection and/or diagnosis of SARS-CoV-2 by FDA under an Emergency Use Authorization (EUA). This EUA will remain  in effect (meaning this test can be used) for the duration of the COVID-19 declaration under Section 564(b)(1) of the Act, 21 U.S.C.section 360bbb-3(b)(1), unless  the authorization is terminated  or revoked sooner.       Influenza A by PCR NEGATIVE NEGATIVE Final   Influenza B by PCR NEGATIVE NEGATIVE Final    Comment: (NOTE) The Xpert Xpress SARS-CoV-2/FLU/RSV plus assay is intended as an aid in the diagnosis of influenza from Nasopharyngeal swab specimens and should not be used as a sole basis for treatment. Nasal washings and aspirates are unacceptable for Xpert Xpress SARS-CoV-2/FLU/RSV testing.  Fact Sheet for  Patients: EntrepreneurPulse.com.au  Fact Sheet for Healthcare Providers: IncredibleEmployment.be  This test is not yet approved or cleared by the [place] FDA and has been authorized for detection and/or diagnosis of SARS-CoV-2 by FDA under an Emergency Use Authorization (EUA). This EUA will remain in effect (meaning this test can be used) for the duration of the COVID-19 declaration under Section 564(b)(1) of the Act, 21 U.S.C. section 360bbb-3(b)(1), unless the authorization is terminated or revoked.  Performed at [facility], [place] [address] [place], [place] [address]          Radiology Studies: CT ABDOMEN PELVIS WO CONTRAST  Result Date: 02/13/2021 CLINICAL DATA:  Anemia.  To assess for abdominal bleed. EXAM: CT ABDOMEN AND PELVIS WITHOUT CONTRAST TECHNIQUE: Multidetector CT imaging of the abdomen and pelvis was performed following the standard protocol without IV contrast. COMPARISON:  None. FINDINGS: Lower chest: Small bilateral pleural effusions. Coarse peripheral infiltrates with peribronchial thickening likely representing chronic interstitial lung disease. Postoperative changes in the mediastinum. Diffuse cardiac enlargement. Hepatobiliary: No focal liver abnormality is seen. No gallstones, gallbladder wall thickening, or biliary dilatation. Pancreas: Unremarkable. No pancreatic ductal dilatation or surrounding inflammatory changes. Spleen: Normal in size without focal abnormality. Adrenals/Urinary Tract: Adrenal glands are unremarkable. Kidneys are normal, without renal calculi, focal lesion, or hydronephrosis. Bladder is unremarkable. Stomach/Bowel: Stomach, small bowel, and colon are mostly decompressed with scattered stool in the colon. No wall thickening or inflammatory changes are appreciated. Appendix is not identified. Vascular/Lymphatic: Aortic atherosclerosis. No enlarged abdominal or pelvic lymph nodes. Reproductive:  Prostate is unremarkable. Other: No free air or free fluid in the abdomen. Abdominal wall musculature appears intact. There is diffuse edema throughout the subcutaneous soft tissues. In the subcutaneous fat lateral to the right hip, there is a heterogeneous collection with mixed density material measuring 7.1 x 10.1 by 15.5 cm. This is consistent with a soft tissue hematoma. Musculoskeletal: Degenerative changes in the spine and hips. No destructive bone lesions. Acute appearing displaced fractures of the left superior and inferior pubic rami and of the right anterior acetabular rim. IMPRESSION: 1. Large Mets density collection lateral to the right hip consistent with subcutaneous soft tissue hematoma. 2. Acute appearing fractures of the right anterior acetabulum and left superior and inferior pubic rami. 3. Diffuse soft tissue edema. 4. Coarse interstitial changes and bronchial thickening in the lung bases suggesting chronic interstitial lung disease. 5. Diffuse cardiac enlargement 6. Moderate aortic atherosclerosis. Electronically Signed   By: Lucienne Capers M.D.   On: 02/13/2021 20:43        Scheduled Meds:  clotrimazole  1 application Topical BID   docusate sodium  100 mg Oral BID   fluticasone  2 spray Each Nare Daily   levothyroxine  88 mcg Oral QAC breakfast   pantoprazole  40 mg Oral Daily   Continuous Infusions:   LOS: 2 days     Cordelia Poche, MD [facility] 02/15/2021, 9:50 AM  If 7PM-7AM, please contact night-coverage [email or web address]

## 2021-02-16 LAB — HEMOGLOBIN AND HEMATOCRIT, BLOOD
HCT: 24.5 % — ABNORMAL LOW (ref 39.0–52.0)
HCT: 23.9 % — ABNORMAL LOW (ref 39.0–52.0)
Hemoglobin: 8.3 g/dL — ABNORMAL LOW (ref 13.0–17.0)
Hemoglobin: 7.9 g/dL — ABNORMAL LOW (ref 13.0–17.0)

## 2021-02-16 LAB — BASIC METABOLIC PANEL
Anion gap: 9 (ref 5–15)
BUN: 61 mg/dL — ABNORMAL HIGH (ref 8–23)
CO2: 27 mmol/L (ref 22–32)
Calcium: 7.8 mg/dL — ABNORMAL LOW (ref 8.9–10.3)
Chloride: 89 mmol/L — ABNORMAL LOW (ref 98–111)
Creatinine, Ser: 2.28 mg/dL — ABNORMAL HIGH (ref 0.61–1.24)
GFR, Estimated: 25 mL/min — ABNORMAL LOW (ref >60)
Glucose, Bld: 125 mg/dL — ABNORMAL HIGH (ref 70–99)
Potassium: 4 mmol/L (ref 3.5–5.1)
Sodium: 125 mmol/L — ABNORMAL LOW (ref 135–145)

## 2021-02-16 LAB — CBC
HCT: 20.9 % — ABNORMAL LOW (ref 39.0–52.0)
Hemoglobin: 6.9 g/dL — CL (ref 13.0–17.0)
MCH: 29.9 pg (ref 26.0–34.0)
MCHC: 33 g/dL (ref 30.0–36.0)
MCV: 90.5 fL (ref 80.0–100.0)
Platelets: 137 10*3/uL — ABNORMAL LOW (ref 150–400)
RBC: 2.31 MIL/uL — ABNORMAL LOW (ref 4.22–5.81)
RDW: 14.7 % (ref 11.5–15.5)
WBC: 11.3 10*3/uL — ABNORMAL HIGH (ref 4.0–10.5)
nRBC: 0 % (ref 0.0–0.2)

## 2021-02-16 LAB — OSMOLALITY, URINE: Osmolality, Ur: 411 mOsm/kg (ref 300–900)

## 2021-02-16 LAB — PROTIME-INR
INR: 1.7 — ABNORMAL HIGH (ref 0.8–1.2)
Prothrombin Time: 19.7 seconds — ABNORMAL HIGH (ref 11.4–15.2)

## 2021-02-16 LAB — PREPARE RBC (CROSSMATCH)

## 2021-02-16 LAB — CREATININE, URINE, RANDOM: Creatinine, Urine: 101.98 mg/dL

## 2021-02-16 LAB — SODIUM, URINE, RANDOM: Sodium, Ur: <10 mmol/L

## 2021-02-16 MED ORDER — FUROSEMIDE 10 MG/ML IJ SOLN
40.0000 mg | Freq: Once | INTRAMUSCULAR | Status: AC
  Administered 2021-02-16: 40 mg via INTRAVENOUS
  Filled 2021-02-16: qty 4

## 2021-02-16 MED ORDER — SODIUM CHLORIDE 0.9 % IV SOLN: INTRAVENOUS | Status: DC

## 2021-02-16 MED ORDER — SODIUM CHLORIDE 0.9% IV SOLUTION: Freq: Once | INTRAVENOUS | Status: AC

## 2021-02-16 NOTE — Progress Notes (Signed)
PROGRESS NOTE    [name]  [id number]:[id number] DOB: 1920/07/08 DOA: 02/12/2021 PCP: [name], [name], NP   Brief Narrative: [name] is a 87 y.o. male with a history of atrial fibrillation, hypertension, anxiety, depression, hypothyroidism. Patient presented secondary to a fall without LOC. He was found to have a pelvic fracture in addition to vertebral fractures. Unfortunately, he developed acute anemia in setting of a large hematoma likely from trauma and complicated by Coumadin use.   Assessment & Plan:   Principal Problem:   Closed fracture of multiple pubic rami [facility]) Active Problems:   Fall   Pelvic fracture (HCC)   Acute blood loss anemia   Traumatic hematoma of right hip   AKI (acute kidney injury) ([place])   CKD (chronic kidney disease), stage III (HCC)   Atrial fibrillation, chronic ([place])   Fall In setting of standing from seated position. No history consistent with syncopal episode. Patient suffered pelvic/thoracic vertebral fracture.  Pelvic fracture T1-T2 vertebral fractures Orthopedic surgery consulted. Recommendation for conservative management.  Acute blood loss anemia Hematoma CT abdomen/pelvis and physical exam consistent with large right hip hematoma. Patient is asymptomatic. Complicated by Coumadin use and INR of 3.2 on admission. Patient received 1 unit of PRBC on 7/7 with continued hemoglobin drift downward. Vitamin K given on 7/8 and required another 1 unit PRBC transfusion on 7/8 and another on 7/10. INR down to 1.7 today -Trend CBC/H&H -Transfuse for hemoglobin <7 or recurrent hypotension -INR in AM  Hypotension In setting of blood loss anemia. Given midodrine x1. Given a 500 mL NS bolus on 7/8. History of reduced EF heart failure however, unlikely this is related decompensated heart failure/cardiogenic shock. Currently stable. -Watch BP -IV fluid use judiciously  Chronic systolic heart failure EF of 40% from prior Transthoracic Echocardiogram  on chart review. BLE edema but overall not fluid overload. With acute blood loss, more volume depleted at this time.  AKI on CKD stage IIIb Baseline creatinine of 1.9-2 from chart review. Slight increase of creatinine to 2.31 today possibly related to lasix last night. -BMP in AM  Chronic atrial fibrillation Patient is on Coumadin and metoprolol as an outpatient. Currently rate controlled. Metoprolol held secondary to hypotension. Coumadin held secondary to acute anemia.  Hypothyroidism -Continue Synthroid  Hyponatremia Sodium of 126 on admission with mild improvement. Now back to 125. No associated symptoms. Serum osmolality of 286. Undetectable sodium in urine indicating hypovolemia; patient has significant pitting edema with hypotension/soft BP. Difficult to gauge. -Daily BMP -Lasix 40 mg IV x1 -May require lasix; complicated by soft blood pressure   DVT prophylaxis: SCDs Code Status:   Code Status: DNR Family Communication: Son-in-law on telephone; no answer Disposition Plan: Discharge possibly back to SNF in several days pending stabilization of hemoglobin/hematoma, eventual PT/OT recommendations   Consultants:  Orthopedic surgery  Procedures:  PRBC transfusion (7/7; 7/8; 7/10)  Antimicrobials: None   Subjective: No concerns today. No issues overnight.  Objective: Vitals:   02/16/21 0700 02/16/21 0800 02/16/21 0805 02/16/21 0810  BP:  (!) 96/50    Pulse:  (!) 103 99 99  Resp: 16 20 (!) 23 18  Temp:      TempSrc:      SpO2:  (!) 82% 100% 100%  Weight:      Height:        Intake/Output Summary (Last 24 hours) at 02/16/2021 0854 Last data filed at 02/16/2021 0853 Gross per 24 hour  Intake 520 ml  Output 650 ml  Net -130 ml    Filed Weights   02/14/21 0449 02/15/21 0027 02/16/21 0143  Weight: 76.2 kg 77.9 kg 77.4 kg    Examination:  General exam: Appears calm and comfortable Respiratory system: Clear to auscultation. Respiratory effort  normal. Cardiovascular system: S1 & S2 heard, irregular rhythm and normal rate. No murmurs, rubs, gallops or clicks. Gastrointestinal system: Abdomen is nondistended, soft and nontender. No organomegaly or masses felt. Normal bowel sounds heard. Central nervous system: Alert and oriented. No focal neurological deficits. Musculoskeletal: BLE 2+ pitting edema. No calf tenderness Skin: No cyanosis. Large area of ecchymosis of right thigh/hip Psychiatry: Judgement and insight appear normal. Mood & affect appropriate.     Data Reviewed: I have personally reviewed following labs and imaging studies  CBC Lab Results  Component Value Date   WBC 11.3 (H) 02/16/2021   RBC 2.31 (L) 02/16/2021   HGB 6.9 (LL) 02/16/2021   HCT 20.9 (L) 02/16/2021   MCV 90.5 02/16/2021   MCH 29.9 02/16/2021   PLT 137 (L) 02/16/2021   MCHC 33.0 02/16/2021   RDW 14.7 02/16/2021   LYMPHSABS 0.6 (L) 02/13/2021   MONOABS 0.9 02/13/2021   EOSABS 0.0 02/13/2021   BASOSABS 0.0 97/67/3419     Last metabolic panel Lab Results  Component Value Date   NA 125 (L) 02/16/2021   K 4.0 02/16/2021   CL 89 (L) 02/16/2021   CO2 27 02/16/2021   BUN 61 (H) 02/16/2021   CREATININE 2.28 (H) 02/16/2021   GLUCOSE 125 (H) 02/16/2021   GFRNONAA 25 (L) 02/16/2021   CALCIUM 7.8 (L) 02/16/2021   PROT 4.4 (L) 02/13/2021   ALBUMIN 2.3 (L) 02/13/2021   BILITOT 1.1 02/13/2021   ALKPHOS 100 02/13/2021   AST 22 02/13/2021   ALT 13 02/13/2021   ANIONGAP 9 02/16/2021    CBG (last 3)  No results for input(s): GLUCAP in the last 72 hours.   GFR: Estimated Creatinine Clearance: 17.7 mL/min (A) (by C-G formula based on SCr of 2.28 mg/dL (H)).  Coagulation Profile: Recent Labs  Lab 02/12/21 2244 02/13/21 1012 02/15/21 0141 02/16/21 0227  INR 3.2* 3.4* 2.1* 1.7*     Recent Results (from the past 240 hour(s))  Resp Panel by RT-PCR (Flu A&B, Covid) Nasopharyngeal Swab     Status: None   Collection Time: 02/12/21 11:27 PM    Specimen: Nasopharyngeal Swab; Nasopharyngeal(NP) swabs in vial transport medium  Result Value Ref Range Status   SARS Coronavirus 2 by RT PCR NEGATIVE NEGATIVE Final    Comment: (NOTE) SARS-CoV-2 target nucleic acids are NOT DETECTED.  The SARS-CoV-2 RNA is generally detectable in upper respiratory specimens during the acute phase of infection. The lowest concentration of SARS-CoV-2 viral copies this assay can detect is 138 copies/mL. A negative result does not preclude SARS-Cov-2 infection and should not be used as the sole basis for treatment or other patient management decisions. A negative result may occur with  improper specimen collection/handling, submission of specimen other than nasopharyngeal swab, presence of viral mutation(s) within the areas targeted by this assay, and inadequate number of viral copies(<138 copies/mL). A negative result must be combined with clinical observations, patient history, and epidemiological information. The expected result is Negative.  Fact Sheet for Patients:  EntrepreneurPulse.com.au  Fact Sheet for Healthcare Providers:  IncredibleEmployment.be  This test is no t yet approved or cleared by the [place] FDA and  has been authorized for detection and/or diagnosis of SARS-CoV-2 by FDA under an Emergency Use  Authorization (EUA). This EUA will remain  in effect (meaning this test can be used) for the duration of the COVID-19 declaration under Section 564(b)(1) of the Act, 21 U.S.C.section 360bbb-3(b)(1), unless the authorization is terminated  or revoked sooner.       Influenza A by PCR NEGATIVE NEGATIVE Final   Influenza B by PCR NEGATIVE NEGATIVE Final    Comment: (NOTE) The Xpert Xpress SARS-CoV-2/FLU/RSV plus assay is intended as an aid in the diagnosis of influenza from Nasopharyngeal swab specimens and should not be used as a sole basis for treatment. Nasal washings and aspirates are  unacceptable for Xpert Xpress SARS-CoV-2/FLU/RSV testing.  Fact Sheet for Patients: EntrepreneurPulse.com.au  Fact Sheet for Healthcare Providers: IncredibleEmployment.be  This test is not yet approved or cleared by the [place] FDA and has been authorized for detection and/or diagnosis of SARS-CoV-2 by FDA under an Emergency Use Authorization (EUA). This EUA will remain in effect (meaning this test can be used) for the duration of the COVID-19 declaration under Section 564(b)(1) of the Act, 21 U.S.C. section 360bbb-3(b)(1), unless the authorization is terminated or revoked.  Performed at [facility], [place] [address] [place], [place] [address]          Radiology Studies: No results found.      Scheduled Meds:  clotrimazole  1 application Topical BID   docusate sodium  100 mg Oral BID   fluticasone  2 spray Each Nare Daily   levothyroxine  88 mcg Oral QAC breakfast   pantoprazole  40 mg Oral Daily   Continuous Infusions:   LOS: 3 days     Cordelia Poche, MD [facility] 02/16/2021, 8:54 AM  If 7PM-7AM, please contact night-coverage [email or web address]

## 2021-02-16 NOTE — Plan of Care (Signed)
Patient received 1 unit PRBC this AM. Congested cough. VS stable. Failed attempt to wean [place] O2: SPO2 dropped to 81% on RA. Dr. Lonny Prude notified.   Problem: Education: Goal: Knowledge of General Education information will improve Description: Including pain rating scale, medication(s)/side effects and non-pharmacologic comfort measures Outcome: Progressing   Problem: Health Behavior/Discharge Planning: Goal: Ability to manage health-related needs will improve Outcome: Progressing   Problem: Clinical Measurements: Goal: Ability to maintain clinical measurements within normal limits will improve Outcome: Progressing Goal: Will remain free from infection Outcome: Progressing Goal: Diagnostic test results will improve Outcome: Progressing Goal: Respiratory complications will improve Outcome: Progressing Goal: Cardiovascular complication will be avoided Outcome: Progressing   Problem: Activity: Goal: Risk for activity intolerance will decrease Outcome: Progressing   Problem: Nutrition: Goal: Adequate nutrition will be maintained Outcome: Progressing   Problem: Coping: Goal: Level of anxiety will decrease Outcome: Progressing   Problem: Elimination: Goal: Will not experience complications related to bowel motility Outcome: Progressing Goal: Will not experience complications related to urinary retention Outcome: Progressing   Problem: Pain Managment: Goal: General experience of comfort will improve Outcome: Progressing   Problem: Safety: Goal: Ability to remain free from injury will improve Outcome: Progressing   Problem: Skin Integrity: Goal: Risk for impaired skin integrity will decrease Outcome: Progressing   Problem: Education: Goal: Ability to demonstrate management of disease process will improve Outcome: Progressing Goal: Ability to verbalize understanding of medication therapies will improve Outcome: Progressing Goal: Individualized Educational  Video(s) Outcome: Progressing   Problem: Activity: Goal: Capacity to carry out activities will improve Outcome: Progressing   Problem: Cardiac: Goal: Ability to achieve and maintain adequate cardiopulmonary perfusion will improve Outcome: Progressing

## 2021-02-16 NOTE — Progress Notes (Signed)
Critical lab pf Hgb 6.9 was called on this patient and MD paged to notify,

## 2021-02-17 ENCOUNTER — Inpatient Hospital Stay (HOSPITAL_COMMUNITY): Payer: Medicare Other

## 2021-02-17 LAB — TYPE AND SCREEN
ABO/RH(D): A POS
Antibody Screen: NEGATIVE
Unit division: 0
Unit division: 0
Unit division: 0

## 2021-02-17 LAB — PROTIME-INR
INR: 1.5 — ABNORMAL HIGH (ref 0.8–1.2)
Prothrombin Time: 18.1 seconds — ABNORMAL HIGH (ref 11.4–15.2)

## 2021-02-17 LAB — BASIC METABOLIC PANEL
Anion gap: 10 (ref 5–15)
BUN: 60 mg/dL — ABNORMAL HIGH (ref 8–23)
CO2: 30 mmol/L (ref 22–32)
Calcium: 8 mg/dL — ABNORMAL LOW (ref 8.9–10.3)
Chloride: 88 mmol/L — ABNORMAL LOW (ref 98–111)
Creatinine, Ser: 2.23 mg/dL — ABNORMAL HIGH (ref 0.61–1.24)
GFR, Estimated: 26 mL/min — ABNORMAL LOW (ref >60)
Glucose, Bld: 97 mg/dL (ref 70–99)
Potassium: 3.5 mmol/L (ref 3.5–5.1)
Sodium: 128 mmol/L — ABNORMAL LOW (ref 135–145)

## 2021-02-17 LAB — BPAM RBC
Blood Product Expiration Date: 202207262359
Blood Product Expiration Date: 202207282359
Blood Product Expiration Date: 202208022359
ISSUE DATE / TIME: 202207071711
ISSUE DATE / TIME: 202207090213
ISSUE DATE / TIME: 202207100611
Unit Type and Rh: 6200
Unit Type and Rh: 6200
Unit Type and Rh: 6200

## 2021-02-17 LAB — CBC
HCT: 23.4 % — ABNORMAL LOW (ref 39.0–52.0)
Hemoglobin: 7.9 g/dL — ABNORMAL LOW (ref 13.0–17.0)
MCH: 31 pg (ref 26.0–34.0)
MCHC: 33.8 g/dL (ref 30.0–36.0)
MCV: 91.8 fL (ref 80.0–100.0)
Platelets: 141 10*3/uL — ABNORMAL LOW (ref 150–400)
RBC: 2.55 MIL/uL — ABNORMAL LOW (ref 4.22–5.81)
RDW: 14.6 % (ref 11.5–15.5)
WBC: 9.6 10*3/uL (ref 4.0–10.5)
nRBC: 0 % (ref 0.0–0.2)

## 2021-02-17 LAB — HEMOGLOBIN AND HEMATOCRIT, BLOOD
HCT: 25.8 % — ABNORMAL LOW (ref 39.0–52.0)
Hemoglobin: 8.7 g/dL — ABNORMAL LOW (ref 13.0–17.0)

## 2021-02-17 MED ORDER — BISACODYL 10 MG RE SUPP
10.0000 mg | Freq: Once | RECTAL | Status: AC
  Administered 2021-02-17: 10 mg via RECTAL
  Filled 2021-02-17: qty 1

## 2021-02-17 MED ORDER — FUROSEMIDE 10 MG/ML IJ SOLN
40.0000 mg | Freq: Once | INTRAMUSCULAR | Status: AC
  Administered 2021-02-17: 40 mg via INTRAVENOUS
  Filled 2021-02-17: qty 4

## 2021-02-17 NOTE — Care Management Important Message (Signed)
Important Message  Patient Details  Name: [name] MRN: [id number] Date of Birth: September 07, 1919   Medicare Important Message Given:  Yes     Shelda Altes 02/17/2021, 9:38 AM

## 2021-02-17 NOTE — Progress Notes (Signed)
Orthopedic Tech Progress Note Patient Details:  [name] 1920/02/21 [id number] Applied unna boots to BLE. Ortho Devices Type of Ortho Device: Haematologist Ortho Device/Splint Location: BLE Ortho Device/Splint Interventions: Application   Post Interventions Patient Tolerated: Well Instructions Provided: Care of device  [name] 02/17/2021, 7:16 PM

## 2021-02-17 NOTE — Progress Notes (Addendum)
PROGRESS NOTE    [name]  ZOX:[id number] DOB: Apr 26, 1920 DOA: 02/12/2021 PCP: [name], [name], NP   Brief Narrative: [name] is a 87 y.o. male with a history of atrial fibrillation, hypertension, anxiety, depression, hypothyroidism. Patient presented secondary to a fall without LOC. He was found to have a pelvic fracture in addition to vertebral fractures. Unfortunately, he developed acute anemia in setting of a large hematoma likely from trauma and complicated by Coumadin use.   Assessment & Plan:   Principal Problem:   Closed fracture of multiple pubic rami [facility]) Active Problems:   Fall   Pelvic fracture (HCC)   Acute blood loss anemia   Traumatic hematoma of right hip   AKI (acute kidney injury) ([place])   CKD (chronic kidney disease), stage III (HCC)   Atrial fibrillation, chronic ([place])   Fall In setting of standing from seated position. No history consistent with syncopal episode. Patient suffered pelvic/thoracic vertebral fracture.  Pelvic fracture T1-T2 vertebral fractures Orthopedic surgery consulted. Recommendation for conservative management.  Acute blood loss anemia Hematoma CT abdomen/pelvis and physical exam consistent with large right hip hematoma. Patient is asymptomatic. Complicated by Coumadin use and INR of 3.2 on admission. Patient received 1 unit of PRBC on 7/7 with continued hemoglobin drift downward. Vitamin K given on 7/8 and required another 1 unit PRBC transfusion on 7/8 and another on 7/10. Hemoglobin now appears to be stabilized. INR down to 1.5 today -Trend CBC/H&H -Transfuse for hemoglobin <7 or recurrent hypotension  Hypotension In setting of blood loss anemia. Given midodrine x1. Given a 500 mL NS bolus on 7/8. History of reduced EF heart failure however, unlikely this is related decompensated heart failure/cardiogenic shock. Currently stable. -Watch BP -IV fluid use judiciously  Chronic systolic heart failure EF of 40% from prior  Transthoracic Echocardiogram on chart review. BLE edema and appears to be at least 10 lbs above previous weight. -Lasix 40 mg x1 again today  AKI on CKD stage IIIb Baseline creatinine of 1.9-2 from chart review. Creatinine seems to have stabilized. -BMP in AM  Chronic atrial fibrillation Patient is on Coumadin and metoprolol as an outpatient. Currently rate controlled. Metoprolol held secondary to hypotension. Coumadin held secondary to acute anemia.  Hypothyroidism -Continue Synthroid  Hyponatremia Sodium of 126 on admission with mild improvement. Now back to 125. No associated symptoms. Serum osmolality of 286. Undetectable sodium in urine indicating hypovolemia; patient has significant pitting edema with hypotension/soft BP. Difficult to gauge. Given Lasix 40 mg yesterday with some improvement of sodium this morning. -Daily BMP -Lasix 40 mg IV x1 again   DVT prophylaxis: SCDs Code Status:   Code Status: DNR Family Communication: None at bedside Disposition Plan: Discharge possibly back to SNF in several days pending stabilization of hemoglobin/hematoma, eventual PT/OT recommendations   Consultants:  Orthopedic surgery  Procedures:  PRBC transfusion (7/7; 7/8; 7/10)  Antimicrobials: None   Subjective: No concerns overnight. Did not get up yesterday.  Objective: Vitals:   02/16/21 1200 02/16/21 1500 02/16/21 1955 02/17/21 0305  BP: (!) 116/54 108/65 120/64 (!) 104/57  Pulse: 97 99 (!) 106 95  Resp: 18 20 18 20   Temp: 98.2 F (36.8 C) (!) 97 F (36.1 C) 97.8 F (36.6 C) 97.8 F (36.6 C)  TempSrc: Oral Axillary Oral Oral  SpO2: 96% 94% 95% 96%  Weight:    78.2 kg  Height:        Intake/Output Summary (Last 24 hours) at 02/17/2021 8119 Last data filed at  02/17/2021 0300 Gross per 24 hour  Intake 918 ml  Output 1150 ml  Net -232 ml    Filed Weights   02/15/21 0027 02/16/21 0143 02/17/21 0305  Weight: 77.9 kg 77.4 kg 78.2 kg    Examination:  General  exam: Appears calm and comfortable Respiratory system: Rhonchi. Respiratory effort normal. Cardiovascular system: S1 & S2 heard, Irregular rhythm with normal rate Gastrointestinal system: Abdomen is nondistended, soft and nontender. No organomegaly or masses felt. Normal bowel sounds heard. Central nervous system: Alert and oriented. No focal neurological deficits. Musculoskeletal: No edema. No calf tenderness Skin: No cyanosis. Large area of ecchymosis of right hip; no tenderness Psychiatry: Judgement and insight appear normal. Mood & affect appropriate.    Data Reviewed: I have personally reviewed following labs and imaging studies  CBC Lab Results  Component Value Date   WBC 9.6 02/17/2021   RBC 2.55 (L) 02/17/2021   HGB 7.9 (L) 02/17/2021   HCT 23.4 (L) 02/17/2021   MCV 91.8 02/17/2021   MCH 31.0 02/17/2021   PLT 141 (L) 02/17/2021   MCHC 33.8 02/17/2021   RDW 14.6 02/17/2021   LYMPHSABS 0.6 (L) 02/13/2021   MONOABS 0.9 02/13/2021   EOSABS 0.0 02/13/2021   BASOSABS 0.0 54/62/7035     Last metabolic panel Lab Results  Component Value Date   NA 128 (L) 02/17/2021   K 3.5 02/17/2021   CL 88 (L) 02/17/2021   CO2 30 02/17/2021   BUN 60 (H) 02/17/2021   CREATININE 2.23 (H) 02/17/2021   GLUCOSE 97 02/17/2021   GFRNONAA 26 (L) 02/17/2021   CALCIUM 8.0 (L) 02/17/2021   PROT 4.4 (L) 02/13/2021   ALBUMIN 2.3 (L) 02/13/2021   BILITOT 1.1 02/13/2021   ALKPHOS 100 02/13/2021   AST 22 02/13/2021   ALT 13 02/13/2021   ANIONGAP 10 02/17/2021    CBG (last 3)  No results for input(s): GLUCAP in the last 72 hours.   GFR: Estimated Creatinine Clearance: 18.2 mL/min (A) (by C-G formula based on SCr of 2.23 mg/dL (H)).  Coagulation Profile: Recent Labs  Lab 02/12/21 2244 02/13/21 1012 02/15/21 0141 02/16/21 0227 02/17/21 0407  INR 3.2* 3.4* 2.1* 1.7* 1.5*     Recent Results (from the past 240 hour(s))  Resp Panel by RT-PCR (Flu A&B, Covid) Nasopharyngeal Swab      Status: None   Collection Time: 02/12/21 11:27 PM   Specimen: Nasopharyngeal Swab; Nasopharyngeal(NP) swabs in vial transport medium  Result Value Ref Range Status   SARS Coronavirus 2 by RT PCR NEGATIVE NEGATIVE Final    Comment: (NOTE) SARS-CoV-2 target nucleic acids are NOT DETECTED.  The SARS-CoV-2 RNA is generally detectable in upper respiratory specimens during the acute phase of infection. The lowest concentration of SARS-CoV-2 viral copies this assay can detect is 138 copies/mL. A negative result does not preclude SARS-Cov-2 infection and should not be used as the sole basis for treatment or other patient management decisions. A negative result may occur with  improper specimen collection/handling, submission of specimen other than nasopharyngeal swab, presence of viral mutation(s) within the areas targeted by this assay, and inadequate number of viral copies(<138 copies/mL). A negative result must be combined with clinical observations, patient history, and epidemiological information. The expected result is Negative.  Fact Sheet for Patients:  EntrepreneurPulse.com.au  Fact Sheet for Healthcare Providers:  IncredibleEmployment.be  This test is no t yet approved or cleared by the [place] FDA and  has been authorized for detection and/or diagnosis of  SARS-CoV-2 by FDA under an Emergency Use Authorization (EUA). This EUA will remain  in effect (meaning this test can be used) for the duration of the COVID-19 declaration under Section 564(b)(1) of the Act, 21 U.S.C.section 360bbb-3(b)(1), unless the authorization is terminated  or revoked sooner.       Influenza A by PCR NEGATIVE NEGATIVE Final   Influenza B by PCR NEGATIVE NEGATIVE Final    Comment: (NOTE) The Xpert Xpress SARS-CoV-2/FLU/RSV plus assay is intended as an aid in the diagnosis of influenza from Nasopharyngeal swab specimens and should not be used as a sole basis  for treatment. Nasal washings and aspirates are unacceptable for Xpert Xpress SARS-CoV-2/FLU/RSV testing.  Fact Sheet for Patients: EntrepreneurPulse.com.au  Fact Sheet for Healthcare Providers: IncredibleEmployment.be  This test is not yet approved or cleared by the [place] FDA and has been authorized for detection and/or diagnosis of SARS-CoV-2 by FDA under an Emergency Use Authorization (EUA). This EUA will remain in effect (meaning this test can be used) for the duration of the COVID-19 declaration under Section 564(b)(1) of the Act, 21 U.S.C. section 360bbb-3(b)(1), unless the authorization is terminated or revoked.  Performed at [facility], [place] [address], [place], [place] [address]          Radiology Studies: No results found.      Scheduled Meds:  clotrimazole  1 application Topical BID   docusate sodium  100 mg Oral BID   fluticasone  2 spray Each Nare Daily   levothyroxine  88 mcg Oral QAC breakfast   pantoprazole  40 mg Oral Daily   Continuous Infusions:   LOS: 4 days     Cordelia Poche, MD [facility] 02/17/2021, 7:21 AM  If 7PM-7AM, please contact night-coverage [email or web address]

## 2021-02-17 NOTE — Plan of Care (Signed)
  Problem: Education: Goal: Knowledge of General Education information will improve Description Including pain rating scale, medication(s)/side effects and non-pharmacologic comfort measures Outcome: Progressing   Problem: Education: Goal: Knowledge of General Education information will improve Description Including pain rating scale, medication(s)/side effects and non-pharmacologic comfort measures Outcome: Progressing   

## 2021-02-17 NOTE — Progress Notes (Signed)
Physical Therapy Treatment Patient Details Name: [name] MRN: [id number] DOB: 1920-07-18 Today's Date: 02/17/2021    History of Present Illness This 87 y.o. male admitted after a fall at ALF (third fall in 3 months).  He sustained acute displaced fractures involving Lt superior and inferior pubic rami and acute subacute mild compression fractures T1 and T2.  Ortho consulted and pt ok for WBAT. PMH includes: hypothyroidism, chronic anxiety/depression, hypertension, permanent Afib on Coumadin,  chronic diastolic CHF    PT Comments    Pt continues to be limited in functional mobility, balance, gait, and activity tolerance secondary to pain and fatigue. Pt tolerates transfers, requiring 2 person physical assist to complete and multiple cues to scoot and lean forward to come to stand. Pt weightbearing primarily through hindfoot with standing, requiring cues for hip and trunk extension and for weightbearing through mid and forefoot. Pt will benefit from continued acute PT services to improve independence in functional mobility and to aid in return to prior level. SPT recommends SNF placement for strengthening and improvement in activity tolerance.   Follow Up Recommendations  SNF     Equipment Recommendations  Rolling walker with 5" wheels;Wheelchair cushion (measurements PT);Wheelchair (measurements PT)    Recommendations for Other Services       Precautions / Restrictions Precautions Precautions: Fall Precaution Comments: h/o falls Restrictions Weight Bearing Restrictions: No    Mobility  Bed Mobility               General bed mobility comments: received in recliner    Transfers Overall transfer level: Needs assistance Equipment used: None Transfers: Sit to/from Stand Sit to Stand: Max assist;+2 physical assistance         General transfer comment: pt with mulimodal cues to scoot forward in chair and for feet flat on floor. max A + 2 to rise and mod A to continue  standing.  Ambulation/Gait             General Gait Details: deferred secondary to pt reports of fatigue and pain   Stairs             Wheelchair Mobility    Modified Rankin (Stroke Patients Only)       Balance Overall balance assessment: Needs assistance Sitting-balance support: Feet supported Sitting balance-Leahy Scale: Fair     Standing balance support: During functional activity Standing balance-Leahy Scale: Poor Standing balance comment: pt reliant on external support to stand                            Cognition Arousal/Alertness: Awake/alert Behavior During Therapy: WFL for tasks assessed/performed Overall Cognitive Status: Within Functional Limits for tasks assessed                                        Exercises General Exercises - Lower Extremity Ankle Circles/Pumps: Both;10 reps;Seated Long Arc Quad: 10 reps;Both;Seated Hip ABduction/ADduction: 10 reps;Both;Seated Hip Flexion/Marching: 10 reps;Both;Seated    General Comments General comments (skin integrity, edema, etc.): Pt BP 151/140 with LEs elevated, BP retaken in sitting and with LEs down 107/54. Pt oxygen sat levels fluctuating between 68-100% with unreliable pleth line. Pt placed on 4 L for transfers and LE exercises and weaned to 1.5 L at rest at end of session.      Pertinent Vitals/Pain Pain Assessment: 0-10 Pain Score: 10-Worst pain  ever Pain Location: hip Pain Descriptors / Indicators: Discomfort;Grimacing Pain Intervention(s): Monitored during session;Limited activity within patient's tolerance    Home Living                      Prior Function            PT Goals (current goals can now be found in the care plan section) Acute Rehab PT Goals Patient Stated Goal: manage pain Progress towards PT goals: Progressing toward goals    Frequency    Min 3X/week (pending SNF placement)      PT Plan Current plan remains appropriate     Co-evaluation              AM-PAC PT "6 Clicks" Mobility   Outcome Measure  Help needed turning from your back to your side while in a flat bed without using bedrails?: A Little Help needed moving from lying on your back to sitting on the side of a flat bed without using bedrails?: Total Help needed moving to and from a bed to a chair (including a wheelchair)?: Total Help needed standing up from a chair using your arms (e.g., wheelchair or bedside chair)?: Total Help needed to walk in hospital room?: Total Help needed climbing 3-5 steps with a railing? : Total 6 Click Score: 8    End of Session Equipment Utilized During Treatment: Gait belt;Oxygen Activity Tolerance: Patient limited by fatigue;Patient limited by pain Patient left: in chair;with call bell/phone within reach;with family/visitor present Nurse Communication: Mobility status;Other (comment) (o2 sats) PT Visit Diagnosis: Unsteadiness on feet (R26.81);Muscle weakness (generalized) (M62.81)     Time: 1962-2297 PT Time Calculation (min) (ACUTE ONLY): 39 min  Charges:  $Therapeutic Exercise: 8-22 mins $Therapeutic Activity: 23-37 mins                     Acute Rehab  Pager: [phone]    Garwin Brothers, SPT  02/17/2021, 2:43 PM

## 2021-02-18 ENCOUNTER — Ambulatory Visit (HOSPITAL_BASED_OUTPATIENT_CLINIC_OR_DEPARTMENT_OTHER): Payer: PRIVATE HEALTH INSURANCE | Admitting: Family

## 2021-02-18 LAB — BASIC METABOLIC PANEL
Anion gap: 10 (ref 5–15)
BUN: 64 mg/dL — ABNORMAL HIGH (ref 8–23)
CO2: 29 mmol/L (ref 22–32)
Calcium: 8.2 mg/dL — ABNORMAL LOW (ref 8.9–10.3)
Chloride: 89 mmol/L — ABNORMAL LOW (ref 98–111)
Creatinine, Ser: 2.24 mg/dL — ABNORMAL HIGH (ref 0.61–1.24)
GFR, Estimated: 26 mL/min — ABNORMAL LOW (ref >60)
Glucose, Bld: 117 mg/dL — ABNORMAL HIGH (ref 70–99)
Potassium: 3.6 mmol/L (ref 3.5–5.1)
Sodium: 128 mmol/L — ABNORMAL LOW (ref 135–145)

## 2021-02-18 LAB — CBC
HCT: 25 % — ABNORMAL LOW (ref 39.0–52.0)
Hemoglobin: 8.2 g/dL — ABNORMAL LOW (ref 13.0–17.0)
MCH: 30.4 pg (ref 26.0–34.0)
MCHC: 32.8 g/dL (ref 30.0–36.0)
MCV: 92.6 fL (ref 80.0–100.0)
Platelets: 157 10*3/uL (ref 150–400)
RBC: 2.7 MIL/uL — ABNORMAL LOW (ref 4.22–5.81)
RDW: 14.9 % (ref 11.5–15.5)
WBC: 12.7 10*3/uL — ABNORMAL HIGH (ref 4.0–10.5)
nRBC: 0 % (ref 0.0–0.2)

## 2021-02-18 MED ORDER — FUROSEMIDE 10 MG/ML IJ SOLN
40.0000 mg | Freq: Once | INTRAMUSCULAR | Status: AC
  Administered 2021-02-18: 40 mg via INTRAVENOUS
  Filled 2021-02-18: qty 4

## 2021-02-18 MED ORDER — METOPROLOL SUCCINATE ER 25 MG PO TB24
25.0000 mg | ORAL_TABLET | Freq: Every day | ORAL | Status: DC
  Administered 2021-02-18 – 2021-02-25 (×7): 25 mg via ORAL
  Filled 2021-02-18 (×8): qty 1

## 2021-02-18 NOTE — TOC Progression Note (Addendum)
Transition of Care [facility]) - Progression Note    Patient Details  Name: [name] MRN: [id number] Date of Birth: Jan 07, 1920  Transition of Care [facility]) CM/SW Contact  Reece Agar, [place] Phone Number: 02/18/2021, 12:33 PM  Clinical Narrative:    1233: CSW contacted [place] at [facility] to provide update on pt and possible DC. Erline Hau was appreciative of update and asked for updates on any other changes.        Expected Discharge Plan and Services  Patient is expected to DC back to SNF at [facility] with PT/OT.                                                 Social Determinants of Health (SDOH) Interventions    Readmission Risk Interventions No flowsheet data found.

## 2021-02-18 NOTE — Progress Notes (Signed)
Occupational Therapy Treatment Patient Details Name: [name] MRN: [id number] DOB: [birth year] Today's Date: 02/18/2021    History of present illness This 87 y.o. male admitted after a fall at ALF (third fall in 3 months).  He sustained acute displaced fractures involving Lt superior and inferior pubic rami and acute subacute mild compression fractures T1 and T2.  Ortho consulted and pt ok for WBAT. PMH includes: hypothyroidism, chronic anxiety/depression, hypertension, permanent Afib on Coumadin,  chronic diastolic CHF   OT comments  Pt progressing gradually towards OT goals, motivated to attempt all tasks. Session focused on sit to stand transfers with instruction in optimal body mechanics/posture in prep for ADL tasks. Pt overall Max A x 1 for sit to stands using RW from recliner with posterior bias noted, unable to successfully lift feet at this time. Pt denies pain with all activities. Plan to further address body mechanics in standing with consideration of St[place] for ADLs at sink during next session. Continue to recommend SNF rehab at DC.    Follow Up Recommendations  SNF    Equipment Recommendations  None recommended by OT    Recommendations for Other Services      Precautions / Restrictions Precautions Precautions: Fall Precaution Comments: h/o falls Restrictions Weight Bearing Restrictions: Yes Other Position/Activity Restrictions: WBAT       Mobility Bed Mobility               General bed mobility comments: received in recliner    Transfers Overall transfer level: Needs assistance Equipment used: Rolling walker (2 wheeled) Transfers: Sit to/from Stand Sit to Stand: Max assist         General transfer comment: Max A for sit to stands x 2 from recliner using RW. Multimodal cues for posture (tucking bottom in) and transitional techniques to maximize abilities (scooting forward, using momentum, hand/foot placement). Able to stand for about 10 sec with  external support    Balance Overall balance assessment: Needs assistance Sitting-balance support: Feet supported Sitting balance-Leahy Scale: Fair   Postural control: Posterior lean Standing balance support: During functional activity Standing balance-Leahy Scale: Poor Standing balance comment: pt reliant on external support to stand                           ADL either performed or assessed with clinical judgement   ADL Overall ADL's : Needs assistance/impaired Eating/Feeding: Set up;Sitting Eating/Feeding Details (indicate cue type and reason): Setup to open containers                                   General ADL Comments: Session focused on sit to stand practice in prep for ADL transfers, as well as body mechanics and acheiving optimal posture for LB ADL independence.     Vision   Vision Assessment?: No apparent visual deficits   Perception     Praxis      Cognition Arousal/Alertness: Awake/alert Behavior During Therapy: WFL for tasks assessed/performed Overall Cognitive Status: Within Functional Limits for tasks assessed                                          Exercises Exercises: General Lower Extremity General Exercises - Lower Extremity Ankle Circles/Pumps: Both;5 reps;Seated Long Arc Quad: 10 reps;Both;Seated Hip Flexion/Marching: 5 reps;Both;Seated  Shoulder Instructions       General Comments HR up to 134bpm, SpO2 99% at rest on RA, unable to obtain accurate readings during activity but pt denies much SOB. 2 L O2 replaced at end of session    Pertinent Vitals/ Pain       Pain Assessment: No/denies pain  Home Living                                          Prior Functioning/Environment              Frequency  Min 2X/week        Progress Toward Goals  OT Goals(current goals can now be found in the care plan section)  Progress towards OT goals: Progressing toward  goals  Acute Rehab OT Goals Patient Stated Goal: be able to walk again, do more exercises OT Goal Formulation: With patient/family Time For Goal Achievement: 02/27/21 Potential to Achieve Goals: Good ADL Goals Pt Will Perform Grooming: with mod assist;standing Pt Will Perform Upper Body Bathing: with set-up;sitting Pt Will Perform Lower Body Bathing: with mod assist;with adaptive equipment;sit to/from stand Pt Will Perform Upper Body Dressing: with set-up;sitting Pt Will Perform Lower Body Dressing: with mod assist;sit to/from stand;with adaptive equipment Pt Will Transfer to Toilet: with mod assist;ambulating;regular height toilet;bedside commode Pt Will Perform Toileting - Clothing Manipulation and hygiene: with mod assist;sit to/from stand  Plan Discharge plan remains appropriate    Co-evaluation                 AM-PAC OT "6 Clicks" Daily Activity     Outcome Measure   Help from another person eating meals?: A Little Help from another person taking care of personal grooming?: A Little Help from another person toileting, which includes using toliet, bedpan, or urinal?: Total Help from another person bathing (including washing, rinsing, drying)?: A Lot Help from another person to put on and taking off regular upper body clothing?: A Lot Help from another person to put on and taking off regular lower body clothing?: Total 6 Click Score: 12    End of Session Equipment Utilized During Treatment: Gait belt;Rolling walker;Oxygen  OT Visit Diagnosis: Unsteadiness on feet (R26.81);Pain   Activity Tolerance Patient tolerated treatment well   Patient Left in chair;with call bell/phone within reach   Nurse Communication Mobility status        Time: 1137-1201 OT Time Calculation (min): 24 min  Charges: OT General Charges $OT Visit: 1 Visit OT Treatments $Therapeutic Activity: 23-37 mins  Malachy Chamber, OTR/L Acute Rehab Services Office: [phone]    Layla Maw 02/18/2021, 12:16 PM
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
## 2021-02-18 NOTE — Progress Notes (Signed)
PROGRESS NOTE    [name]  [id number]:[id number] DOB: 1920/02/25 DOA: 02/12/2021 PCP: [name], [name], NP   Brief Narrative: [name] is a 87 y.o. male with a history of atrial fibrillation, hypertension, anxiety, depression, hypothyroidism. Patient presented secondary to a fall without LOC. He was found to have a pelvic fracture in addition to vertebral fractures. Unfortunately, he developed acute anemia in setting of a large hematoma likely from trauma and complicated by Coumadin use.   Assessment & Plan:   Principal Problem:   Closed fracture of multiple pubic rami [facility]) Active Problems:   Fall   Pelvic fracture (HCC)   Acute blood loss anemia   Traumatic hematoma of right hip   AKI (acute kidney injury) ([place])   CKD (chronic kidney disease), stage III (HCC)   Atrial fibrillation, chronic ([place])   Fall In setting of standing from seated position. No history consistent with syncopal episode. Patient suffered pelvic/thoracic vertebral fracture.  Pelvic fracture T1-T2 vertebral fractures Orthopedic surgery consulted. Recommendation for conservative management.  Acute blood loss anemia Hematoma CT abdomen/pelvis and physical exam consistent with large right hip hematoma. Patient is asymptomatic. Complicated by Coumadin use and INR of 3.2 on admission. Patient received 1 unit of PRBC on 7/7 with continued hemoglobin drift downward. Vitamin K given on 7/8 and required another 1 unit PRBC transfusion on 7/8 and another on 7/10. Hemoglobin now appears to be stabilized. INR down to 1.5. Orthopedic surgery informed of hematoma -Trend CBC/H&H -Transfuse for hemoglobin <7 or recurrent hypotension  Hypotension In setting of blood loss anemia. Given midodrine x1. Given a 500 mL NS bolus on 7/8. History of reduced EF heart failure however, unlikely this is related decompensated heart failure/cardiogenic shock. BP currently stable and hypotension appears resolved.  Chronic systolic  heart failure EF of 40% from prior Transthoracic Echocardiogram on chart review. BLE edema and appears to be at least 10 lbs above previous weight. Patient is on torsemide as an outpatient. Weight is up about 8 lbs from admission. UOP not accurately documented in the last 24 hours -Lasix 40 mg x1 again today -Daily weights/strict in and out  AKI on CKD stage IIIb Baseline creatinine of 1.9-2 from chart review. Creatinine seems to have stabilized. -BMP in AM  Chronic atrial fibrillation Patient is on Coumadin and metoprolol as an outpatient. Currently rate controlled. Metoprolol initially held secondary to hypotension. Coumadin held secondary to acute anemia. -Resume metoprolol  Hypothyroidism -Continue Synthroid  Hyponatremia Sodium of 126 on admission with mild improvement. Now back to 125. No associated symptoms. Serum osmolality of 286. Undetectable sodium in urine indicating hypovolemia; patient has significant pitting edema with hypotension/soft BP. Some mild improvement with Lasix -Daily BMP -Lasix 40 mg IV x1 again   DVT prophylaxis: SCDs Code Status:   Code Status: DNR Family Communication: Called son-in-law, no response Disposition Plan: Discharge possibly back to SNF in 1-3 days pending stabilization of hemoglobin/hematoma, stable creatinine, improvement of weight   Consultants:  Orthopedic surgery  Procedures:  PRBC transfusion (7/7; 7/8; 7/10)  Antimicrobials: None   Subjective: Got up with therapy today but states he is not doing well. He does not like that he is limited in ambulation while in the hospital  Objective: Vitals:   02/18/21 0426 02/18/21 0652 02/18/21 0712 02/18/21 1130  BP: 116/72  114/66 123/70  Pulse: (!) 107  (!) 106 (!) 115  Resp: 16 15 18 19   Temp: 97.8 F (36.6 C)  97.6 F (36.4 C) 97.9 F (  36.6 C)  TempSrc: Oral  Oral Oral  SpO2: 92%   99%  Weight: 79.6 kg     Height:        Intake/Output Summary (Last 24 hours) at 02/18/2021  1319 Last data filed at 02/18/2021 0930 Gross per 24 hour  Intake 480 ml  Output 450 ml  Net 30 ml    Filed Weights   02/16/21 0143 02/17/21 0305 02/18/21 0426  Weight: 77.4 kg 78.2 kg 79.6 kg    Examination:  General exam: Appears calm and comfortable Respiratory system: Clear to auscultation. Respiratory effort normal. Cardiovascular system: S1 & S2 heard. Irregular rhythm, tachycardia. No murmurs, rubs, gallops or clicks. Gastrointestinal system: Abdomen is nondistended, soft and nontender. No organomegaly or masses felt. Normal bowel sounds heard. Central nervous system: Alert. No focal neurological deficits. Musculoskeletal: 2+ LE edema. No calf tenderness Skin: No cyanosis. Large area of ecchymosis of right thigh laterally/posteriorly without any tenderness; no erythema Psychiatry: Judgement and insight appear normal. Mood & affect appropriate.    Data Reviewed: I have personally reviewed following labs and imaging studies  CBC Lab Results  Component Value Date   WBC 12.7 (H) 02/18/2021   RBC 2.70 (L) 02/18/2021   HGB 8.2 (L) 02/18/2021   HCT 25.0 (L) 02/18/2021   MCV 92.6 02/18/2021   MCH 30.4 02/18/2021   PLT 157 02/18/2021   MCHC 32.8 02/18/2021   RDW 14.9 02/18/2021   LYMPHSABS 0.6 (L) 02/13/2021   MONOABS 0.9 02/13/2021   EOSABS 0.0 02/13/2021   BASOSABS 0.0 01/75/1025     Last metabolic panel Lab Results  Component Value Date   NA 128 (L) 02/18/2021   K 3.6 02/18/2021   CL 89 (L) 02/18/2021   CO2 29 02/18/2021   BUN 64 (H) 02/18/2021   CREATININE 2.24 (H) 02/18/2021   GLUCOSE 117 (H) 02/18/2021   GFRNONAA 26 (L) 02/18/2021   CALCIUM 8.2 (L) 02/18/2021   PROT 4.4 (L) 02/13/2021   ALBUMIN 2.3 (L) 02/13/2021   BILITOT 1.1 02/13/2021   ALKPHOS 100 02/13/2021   AST 22 02/13/2021   ALT 13 02/13/2021   ANIONGAP 10 02/18/2021    CBG (last 3)  No results for input(s): GLUCAP in the last 72 hours.   GFR: Estimated Creatinine Clearance: 18.3  mL/min (A) (by C-G formula based on SCr of 2.24 mg/dL (H)).  Coagulation Profile: Recent Labs  Lab 02/12/21 2244 02/13/21 1012 02/15/21 0141 02/16/21 0227 02/17/21 0407  INR 3.2* 3.4* 2.1* 1.7* 1.5*     Recent Results (from the past 240 hour(s))  Resp Panel by RT-PCR (Flu A&B, Covid) Nasopharyngeal Swab     Status: None   Collection Time: 02/12/21 11:27 PM   Specimen: Nasopharyngeal Swab; Nasopharyngeal(NP) swabs in vial transport medium  Result Value Ref Range Status   SARS Coronavirus 2 by RT PCR NEGATIVE NEGATIVE Final    Comment: (NOTE) SARS-CoV-2 target nucleic acids are NOT DETECTED.  The SARS-CoV-2 RNA is generally detectable in upper respiratory specimens during the acute phase of infection. The lowest concentration of SARS-CoV-2 viral copies this assay can detect is 138 copies/mL. A negative result does not preclude SARS-Cov-2 infection and should not be used as the sole basis for treatment or other patient management decisions. A negative result may occur with  improper specimen collection/handling, submission of specimen other than nasopharyngeal swab, presence of viral mutation(s) within the areas targeted by this assay, and inadequate number of viral copies(<138 copies/mL). A negative result must be combined with  clinical observations, patient history, and epidemiological information. The expected result is Negative.  Fact Sheet for Patients:  EntrepreneurPulse.com.au  Fact Sheet for Healthcare Providers:  IncredibleEmployment.be  This test is no t yet approved or cleared by the [place] FDA and  has been authorized for detection and/or diagnosis of SARS-CoV-2 by FDA under an Emergency Use Authorization (EUA). This EUA will remain  in effect (meaning this test can be used) for the duration of the COVID-19 declaration under Section 564(b)(1) of the Act, 21 U.S.C.section 360bbb-3(b)(1), unless the authorization is  terminated  or revoked sooner.       Influenza A by PCR NEGATIVE NEGATIVE Final   Influenza B by PCR NEGATIVE NEGATIVE Final    Comment: (NOTE) The Xpert Xpress SARS-CoV-2/FLU/RSV plus assay is intended as an aid in the diagnosis of influenza from Nasopharyngeal swab specimens and should not be used as a sole basis for treatment. Nasal washings and aspirates are unacceptable for Xpert Xpress SARS-CoV-2/FLU/RSV testing.  Fact Sheet for Patients: EntrepreneurPulse.com.au  Fact Sheet for Healthcare Providers: IncredibleEmployment.be  This test is not yet approved or cleared by the [place] FDA and has been authorized for detection and/or diagnosis of SARS-CoV-2 by FDA under an Emergency Use Authorization (EUA). This EUA will remain in effect (meaning this test can be used) for the duration of the COVID-19 declaration under Section 564(b)(1) of the Act, 21 U.S.C. section 360bbb-3(b)(1), unless the authorization is terminated or revoked.  Performed at [facility], [place] [address], [place], [place] [address]          Radiology Studies: DG CHEST PORT 1 VIEW  Result Date: 02/17/2021 CLINICAL DATA:  Fall, rhonchi EXAM: PORTABLE CHEST 1 VIEW COMPARISON:  02/12/2021 FINDINGS: Unchanged low volume AP portable examination. Cardiomegaly status post median sternotomy. Bibasilar predominant coarse fibrotic interstitial opacity is unchanged. No new or focal airspace opacity. IMPRESSION: 1. Unchanged low volume AP portable examination. No new or focal airspace opacity. 2. Cardiomegaly. 3. Bibasilar predominant coarse fibrotic interstitial opacity, unchanged. Electronically Signed   By: Eddie Candle M.D.   On: 02/17/2021 09:58        Scheduled Meds:  clotrimazole  1 application Topical BID   docusate sodium  100 mg Oral BID   fluticasone  2 spray Each Nare Daily   levothyroxine  88 mcg Oral QAC breakfast   pantoprazole  40 mg Oral Daily    Continuous Infusions:   LOS: 5 days     Cordelia Poche, MD [facility] 02/18/2021, 1:19 PM  If 7PM-7AM, please contact night-coverage [email or web address]

## 2021-02-19 DIAGNOSIS — S32512A Fracture of superior rim of left pubis, initial encounter for closed fracture: Secondary | ICD-10-CM

## 2021-02-19 DIAGNOSIS — S7001XA Contusion of right hip, initial encounter: Secondary | ICD-10-CM

## 2021-02-19 LAB — BASIC METABOLIC PANEL
Anion gap: 15 (ref 5–15)
BUN: 62 mg/dL — ABNORMAL HIGH (ref 8–23)
CO2: 24 mmol/L (ref 22–32)
Calcium: 8 mg/dL — ABNORMAL LOW (ref 8.9–10.3)
Chloride: 89 mmol/L — ABNORMAL LOW (ref 98–111)
Creatinine, Ser: 2.07 mg/dL — ABNORMAL HIGH (ref 0.61–1.24)
GFR, Estimated: 28 mL/min — ABNORMAL LOW (ref >60)
Glucose, Bld: 87 mg/dL (ref 70–99)
Potassium: 3.1 mmol/L — ABNORMAL LOW (ref 3.5–5.1)
Sodium: 128 mmol/L — ABNORMAL LOW (ref 135–145)

## 2021-02-19 LAB — CBC
HCT: 26 % — ABNORMAL LOW (ref 39.0–52.0)
Hemoglobin: 8.6 g/dL — ABNORMAL LOW (ref 13.0–17.0)
MCH: 31 pg (ref 26.0–34.0)
MCHC: 33.1 g/dL (ref 30.0–36.0)
MCV: 93.9 fL (ref 80.0–100.0)
Platelets: 160 10*3/uL (ref 150–400)
RBC: 2.77 MIL/uL — ABNORMAL LOW (ref 4.22–5.81)
RDW: 15.5 % (ref 11.5–15.5)
WBC: 9.3 10*3/uL (ref 4.0–10.5)
nRBC: 0.2 % (ref 0.0–0.2)

## 2021-02-19 MED ORDER — TORSEMIDE 20 MG PO TABS
40.0000 mg | ORAL_TABLET | Freq: Every day | ORAL | Status: DC
  Administered 2021-02-19 – 2021-02-21 (×3): 40 mg via ORAL
  Filled 2021-02-19 (×3): qty 2

## 2021-02-19 MED ORDER — ENSURE ENLIVE PO LIQD
237.0000 mL | Freq: Three times a day (TID) | ORAL | Status: DC
  Administered 2021-02-19 – 2021-02-25 (×8): 237 mL via ORAL

## 2021-02-19 MED ORDER — POTASSIUM CHLORIDE CRYS ER 20 MEQ PO TBCR
40.0000 meq | EXTENDED_RELEASE_TABLET | Freq: Once | ORAL | Status: AC
  Administered 2021-02-19: 40 meq via ORAL
  Filled 2021-02-19: qty 2

## 2021-02-19 MED ORDER — SIMVASTATIN 20 MG PO TABS
10.0000 mg | ORAL_TABLET | Freq: Every day | ORAL | Status: DC
  Administered 2021-02-19 – 2021-02-25 (×7): 10 mg via ORAL
  Filled 2021-02-19 (×7): qty 1

## 2021-02-19 NOTE — Progress Notes (Signed)
Pt alittle more lethargic is am from yesterday. Bilateral swelling +3 and crackles noted in the bases. MD made aware.

## 2021-02-19 NOTE — Progress Notes (Signed)
Initial Nutrition Assessment  DOCUMENTATION CODES:   Non-severe (moderate) malnutrition in context of chronic illness  INTERVENTION:   - Ensure Enlive po TID, each supplement provides 350 kcal and 20 grams of protein  - Magic Cup BID with meals, each supplement provides 290 kcal and 9 grams of protein  - Downgrade diet to dysphagia 3 for ease of intake  - Recommend feeding assistance with meals  NUTRITION DIAGNOSIS:   Moderate Malnutrition related to chronic illness (CHF) as evidenced by moderate fat depletion, severe muscle depletion.  GOAL:   Patient will meet greater than or equal to 90% of their needs  MONITOR:   PO intake, Supplement acceptance, Labs, Weight trends, I & O's, Skin  REASON FOR ASSESSMENT:   Consult Assessment of nutrition requirement/status  ASSESSMENT:   87 year old male who presented to the ED on 7/06 from SNF after a fall. PMH of hypothyroidism, anxiety, depression, HTN, atrial fibrillation, CHF. Pt found to have acute displaced fractures involving left superior and inferior pubic rami and acute or subacute mild compression fractures of T1 and T2. Pt also with AKI.  Spoke with pt at bedside. Pt sleeping soundly at time of RD visit. Noted pt with lunch meal tray in front of him. Pt had consumed ~25% of the serving of spaghetti with meat sauce on his meal tray. The rest of pt's meal was untouched.  Pt awoke to RD touch and voice. Pt reports appetite is okay. He states that he consumed 50% of his lunch meal which is inaccurate. It appears that pt tires easily as he almost fell asleep during conversation with RD. Pt would benefit for assistance with feeding at meal times to preserve energy. Discussed with MD who was in agreement. Verbal with readback order placed.  Pt unable to provide much diet and weight history at time of RD visit. Pt reports that "when he left here" he weighed 170 lbs but now weighs 130 lbs. No weight history available in chart PTA.  Current weight up significantly compared to admit weight. Pt with +3 pitting edema to BLE.  Admit weight: 65 kg Current weight: 77.4 kg  Pt states that he would be willing to try drinking some Ensure Enlive. RD to order. Will also add Magic Cups to lunch and dinner meal trays. Pt also reports that it would be helpful to have his food come already cut or chopped. RD will downgrade diet to dysphagia 3 to aid with ease of intake.  Meal Completion: 25-100% x last 8 meals  Medications reviewed and include: colace, protonix, torsemide, klor-con 40 mEq x 1  Labs reviewed: sodium 128, potassium 3.1, BUN 62, creatinine 2.07, hemoglobin 8.6  UOP: 1475 ml x 24 hours I/O's: -275 ml since admit  NUTRITION - FOCUSED PHYSICAL EXAM:  Flowsheet Row Most Recent Value  Orbital Region Severe depletion  Upper Arm Region Moderate depletion  Thoracic and Lumbar Region Moderate depletion  Buccal Region Moderate depletion  Temple Region Severe depletion  Clavicle Bone Region Severe depletion  Clavicle and Acromion Bone Region Severe depletion  Scapular Bone Region Severe depletion  Dorsal Hand Moderate depletion  Patellar Region Moderate depletion  Anterior Thigh Region Moderate depletion  Posterior Calf Region Moderate depletion  Edema (RD Assessment) Moderate  [BLE]  Hair Reviewed  Eyes Reviewed  Mouth Reviewed  Skin Reviewed  Nails Reviewed       Diet Order:   Diet Order             DIET DYS 3  Room service appropriate? Yes; Fluid consistency: Thin  Diet effective now                   EDUCATION NEEDS:   Not appropriate for education at this time  Skin:  Skin Assessment: Skin Integrity Issues: Other: wound bilateral LE  Last BM:  02/18/21 small type 4  Height:   Ht Readings from Last 1 Encounters:  02/13/21 5\' 6"  (1.676 m)    Weight:   Wt Readings from Last 1 Encounters:  02/18/21 77.4 kg    BMI:  Body mass index is 27.54 kg/m.  Estimated Nutritional Needs:    Kcal:  1600-1800  Protein:  75-90 grams  Fluid:  1.6-1.8 L    Gustavus Bryant, MS, RD, LDN Inpatient Clinical Dietitian Please see AMiON for contact information.

## 2021-02-19 NOTE — Progress Notes (Signed)
PROGRESS NOTE    [name]  [id number]:[id number] DOB: [birth year] DOA: 02/12/2021 PCP: [name], [name], NP    Brief Narrative:  Mr. Sprung was admitted to the hospital with the working diagnosis of pelvic fracture, complicated with acute blood loss anemia (hematoma) in the setting of decompensated heart failure.   87 year old male past medical history for hypothyroidism, chronic anxiety/depression, hypertension, atrial fibrillation, heart failure and ambulatory dysfunction who presented after a mechanical fall.  He fell backwards after standing from his chair.  Reported frequent falls over the last 3 months.  On his initial physical examination his temperature was 98.2, blood pressure 101/56, heart rate 97, respiratory rate 21, oxygen saturation 100% on room air.  His lungs were clear to auscultation, no wheezing or rales, heart S1-S2, present, rhythmic, soft abdomen, no lower extremity edema.  Sodium 126, potassium 4.2, chloride 90, bicarb 27, glucose 126, BUN 48, creatinine 2.10, white count 11.3, hemoglobin 10.9, hematocrit 34.2, platelets 188. SARS COVID-19 negative.  Head CT negative for acute changes. Cervical CT no acute fracture. (Acute/subacute mild compression fractures T1-T2 superior endplate)  Pelvic CT with acute displaced fractures involving the left superior and inferior pubic rami.  Chest radiograph my cardiomegaly, bilateral peripheral reticular markings.69  EKG 111 bpm, left axis deviation, right bundle branch block, atrial fibrillation, q wave lead II, lead III, aVF, V1-V3, poor R wave progression, no ST segment or T wave changes.  Patient developed hypotension and worsening anemia.  Further work-up with CT abdomen/pelvis showed a large right hip hematoma. Received packed red blood cell transfusions and vitamin K to reverse effects of warfarin.  Total of 3 units packed red blood cells transfused.  Worsening edema consistent with decompensated heart failure, placed on  intravenous furosemide.   Assessment & Plan:   Principal Problem:   Closed fracture of multiple pubic rami [facility]) Active Problems:   Fall   Pelvic fracture (HCC)   Acute blood loss anemia   Traumatic hematoma of right hip   AKI (acute kidney injury) ([place])   CKD (chronic kidney disease), stage III (HCC)   Atrial fibrillation, chronic (HCC)   Acute left pelvic fracture (traumatic), complicated with intra-abdominal hematoma, acute blood loss anemia.  SP 3 units PRBC transfusion. SP vitamin K.  Left pelvic pain seems to be controlled, follow up Hgb is  8,6 today from 8,3 yesterday. INR is down to 1.5   Plan to continue pain control (oxycodone and acetaminophen), PT and OT Holding warfarin due to risk of worsening hematoma and increased risk of fall.   2. Acute decompensation of systolic heart failure/ hypotension. LF EF 40%, urine output is 1,475 over last 24 hrs.  Continue to have edema at the lower extremities and positive dyspnea.   Plan to continue diuresis with torsemide.  3. AKI on CKD stage 3b, hyponatremia/ hypokalemia. Hypo-osmolar hyponatremia. Low urinary Na on admission.  Clinically with hypervolemia. Serum cr today is 2,0 with K at 3,1 and serum Na at 128.   Plan to continue diuresis with torsemide, add 40 meq Kcl and follow up renal function in am.  Avoid hypotension and nephrotoxic medications,.   4. Chronic atrial fibrillation. Continue rate control with metoprolol, holding anticoagulation due to acute blood loss anemia.   5. Hypothyroid. Continue with levothyroxine  6. Dyslipidemia. Resume atorvastatin.   Patient continue to be at high risk for worsening hyponatremia and hypokalemia   Status is: Inpatient  Remains inpatient appropriate because:Inpatient level of care appropriate due to severity of illness  Dispo:  Patient From: [facility]  Planned Disposition: [place]  Medically stable for discharge: No     DVT  prophylaxis: Scd   Code Status:   DNR   Family Communication:  I spoke over the phone with the patient's daughter about patient's  condition, plan of care, prognosis and all questions were addressed.      Consultants:  Orthopedics    Subjective: Patient is somnolent this am, positive dyspnea with minimal efforts, no nausea or vomiting, very poor appetite and poor oral intake.   Objective: Vitals:   02/18/21 2357 02/18/21 2357 02/19/21 0407 02/19/21 0750  BP:  105/69 108/63 115/70  Pulse:  (!) 108 79 (!) 103  Resp:  18 16 18   Temp: 98 F (36.7 C)  97.6 F (36.4 C) (!) 97.5 F (36.4 C)  TempSrc: Oral  Oral Oral  SpO2:  98% 96% 99%  Weight:  77.4 kg    Height:        Intake/Output Summary (Last 24 hours) at 02/19/2021 0847 Last data filed at 02/19/2021 0407 Gross per 24 hour  Intake 180 ml  Output 1375 ml  Net -1195 ml   Filed Weights   02/17/21 0305 02/18/21 0426 02/18/21 2357  Weight: 78.2 kg 79.6 kg 77.4 kg    Examination:   General: Not in pain, deconditioned and somnolent,  Neurology: Awake and alert, non focal  E ENT: mild pallor, no icterus, oral mucosa moist Cardiovascular: No JVD. S1-S2 present, rhythmic, no gallops, rubs, or murmurs. +/++ pitting bilateral lower extremity edema. Unna boots in place.  Pulmonary: positive breath sounds bilaterally, , no wheezing, positive rhonchi on the left side with no rales. Gastrointestinal. Abdomen soft and non tender Skin. No rashes Musculoskeletal: no joint deformities     Data Reviewed: I have personally reviewed following labs and imaging studies  CBC: Recent Labs  Lab 02/13/21 1258 02/13/21 2227 02/15/21 0141 02/15/21 0753 02/16/21 0227 02/16/21 1304 02/16/21 2052 02/17/21 0407 02/17/21 1244 02/18/21 0144 02/19/21 0433  WBC 9.5   < > 11.8*  --  11.3*  --   --  9.6  --  12.7* 9.3  NEUTROABS 7.9*  --   --   --   --   --   --   --   --   --   --   HGB 7.7*   < > 6.5*   < > 6.9*   < > 7.9* 7.9* 8.7*  8.2* 8.6*  HCT 24.3*   < > 19.5*   < > 20.9*   < > 23.9* 23.4* 25.8* 25.0* 26.0*  MCV 96.0   < > 89.0  --  90.5  --   --  91.8  --  92.6 93.9  PLT 170   < > 142*  --  137*  --   --  141*  --  157 160   < > = values in this interval not displayed.   Basic Metabolic Panel: Recent Labs  Lab 02/15/21 0141 02/16/21 0227 02/17/21 0407 02/18/21 0144 02/19/21 0433  NA 127* 125* 128* 128* 128*  K 4.1 4.0 3.5 3.6 3.1*  CL 90* 89* 88* 89* 89*  CO2 30 27 30 29 24   GLUCOSE 116* 125* 97 117* 87  BUN 55* 61* 60* 64* 62*  CREATININE 2.24* 2.28* 2.23* 2.24* 2.07*  CALCIUM 7.9* 7.8* 8.0* 8.2* 8.0*   GFR: Estimated Creatinine Clearance: 19.5 mL/min (A) (by C-G formula based on SCr of 2.07  mg/dL (H)). Liver Function Tests: Recent Labs  Lab 02/13/21 1258  AST 22  ALT 13  ALKPHOS 100  BILITOT 1.1  PROT 4.4*  ALBUMIN 2.3*   No results for input(s): LIPASE, AMYLASE in the last 168 hours. No results for input(s): AMMONIA in the last 168 hours. Coagulation Profile: Recent Labs  Lab 02/12/21 2244 02/13/21 1012 02/15/21 0141 02/16/21 0227 02/17/21 0407  INR 3.2* 3.4* 2.1* 1.7* 1.5*   Cardiac Enzymes: No results for input(s): CKTOTAL, CKMB, CKMBINDEX, TROPONINI in the last 168 hours. BNP (last 3 results) No results for input(s): PROBNP in the last 8760 hours. HbA1C: No results for input(s): HGBA1C in the last 72 hours. CBG: No results for input(s): GLUCAP in the last 168 hours. Lipid Profile: No results for input(s): CHOL, HDL, LDLCALC, TRIG, CHOLHDL, LDLDIRECT in the last 72 hours. Thyroid Function Tests: No results for input(s): TSH, T4TOTAL, FREET4, T3FREE, THYROIDAB in the last 72 hours. Anemia Panel: No results for input(s): VITAMINB12, FOLATE, FERRITIN, TIBC, IRON, RETICCTPCT in the last 72 hours.    Radiology Studies: I have reviewed all of the imaging during this hospital visit personally     Scheduled Meds:  clotrimazole  1 application Topical BID   docusate  sodium  100 mg Oral BID   fluticasone  2 spray Each Nare Daily   levothyroxine  88 mcg Oral QAC breakfast   metoprolol succinate  25 mg Oral Daily   pantoprazole  40 mg Oral Daily   Continuous Infusions:   LOS: 6 days        Jakobi Thetford Gerome Apley, MD

## 2021-02-19 NOTE — TOC Progression Note (Addendum)
Transition of Care [facility]) - Progression Note    Patient Details  Name: [name] MRN: [id number] Date of Birth: 12-27-1919  [facility]) CM/SW Contact  Reece Agar, [place] Phone Number: 02/19/2021, 5:37 PM  Clinical Narrative:    CSW following, pt not medically ready for DC. CSW spoke with pt daughter who stated family visited pt today and reported back to her that pt does not seem to be eating. Pt daughter will follow up with pt nurse for more information on pt daily cares. Pt daughter also shared that she usually transport pt back to [facility] but she would prefer [facility] at DC this time.        Expected Discharge Plan and Services  Patient is expected to DC back to SNF at [facility] with PT/OT.                                               Social Determinants of Health (SDOH) Interventions    Readmission Risk Interventions No flowsheet data found.

## 2021-02-20 ENCOUNTER — Inpatient Hospital Stay (HOSPITAL_COMMUNITY): Payer: Medicare Other

## 2021-02-20 DIAGNOSIS — I340 Nonrheumatic mitral (valve) insufficiency: Secondary | ICD-10-CM

## 2021-02-20 DIAGNOSIS — E440 Moderate protein-calorie malnutrition: Secondary | ICD-10-CM | POA: Insufficient documentation

## 2021-02-20 DIAGNOSIS — I361 Nonrheumatic tricuspid (valve) insufficiency: Secondary | ICD-10-CM

## 2021-02-20 DIAGNOSIS — R0609 Other forms of dyspnea: Secondary | ICD-10-CM | POA: Diagnosis not present

## 2021-02-20 LAB — ECHOCARDIOGRAM COMPLETE
Area-P 1/2: 5.27 cm2
Calc EF: 51.8 %
Height: 66 in
P 1/2 time: 590 msec
Radius: 0.2 cm
S' Lateral: 3.6 cm
Single Plane A2C EF: 50 %
Single Plane A4C EF: 52.2 %
Weight: 2740.76 oz

## 2021-02-20 LAB — BASIC METABOLIC PANEL
Anion gap: 11 (ref 5–15)
BUN: 64 mg/dL — ABNORMAL HIGH (ref 8–23)
CO2: 28 mmol/L (ref 22–32)
Calcium: 8.2 mg/dL — ABNORMAL LOW (ref 8.9–10.3)
Chloride: 90 mmol/L — ABNORMAL LOW (ref 98–111)
Creatinine, Ser: 2.02 mg/dL — ABNORMAL HIGH (ref 0.61–1.24)
GFR, Estimated: 29 mL/min — ABNORMAL LOW (ref >60)
Glucose, Bld: 92 mg/dL (ref 70–99)
Potassium: 3.4 mmol/L — ABNORMAL LOW (ref 3.5–5.1)
Sodium: 129 mmol/L — ABNORMAL LOW (ref 135–145)

## 2021-02-20 LAB — HEMOGLOBIN AND HEMATOCRIT, BLOOD
HCT: 26.9 % — ABNORMAL LOW (ref 39.0–52.0)
Hemoglobin: 8.9 g/dL — ABNORMAL LOW (ref 13.0–17.0)

## 2021-02-20 MED ORDER — POTASSIUM CHLORIDE CRYS ER 20 MEQ PO TBCR
40.0000 meq | EXTENDED_RELEASE_TABLET | Freq: Once | ORAL | Status: AC
  Administered 2021-02-20: 40 meq via ORAL
  Filled 2021-02-20: qty 2

## 2021-02-20 NOTE — Progress Notes (Signed)
Physical Therapy Treatment Patient Details Name: [name] MRN: [id number] DOB: 01-28-1920 Today's Date: 02/20/2021    History of Present Illness This 87 y.o. male admitted after a fall at ALF (third fall in 3 months).  He sustained acute displaced fractures involving Lt superior and inferior pubic rami and acute subacute mild compression fractures T1 and T2.  Ortho consulted and pt ok for WBAT. PMH includes: hypothyroidism, chronic anxiety/depression, hypertension, permanent Afib on Coumadin,  chronic diastolic CHF    PT Comments    Pt demonstrates progress this session, tolerating multiple bouts of transfers and requiring less physical assistance to perform. Pt requires cues for trunk flexion for sit>stand transfers and for trunk and hip extension to achieve standing upright posture. Pt performs pregait activities to improve tolerance to weightshifting. Pt continues to present with generalized weakness and deficits in activity tolerance, functional mobility, and gait and will benefit from continued acute PT to improve independence.    Follow Up Recommendations  SNF     Equipment Recommendations  Rolling walker with 5" wheels;Wheelchair cushion (measurements PT);Wheelchair (measurements PT)    Recommendations for Other Services       Precautions / Restrictions Precautions Precautions: Fall Precaution Comments: h/o falls Restrictions RLE Weight Bearing: Weight bearing as tolerated    Mobility  Bed Mobility Overal bed mobility: Needs Assistance Bed Mobility: Sit to Supine       Sit to supine: Mod assist;HOB elevated   General bed mobility comments: mod A for LEs and to scoot up in bed    Transfers Overall transfer level: Needs assistance Equipment used: None;Rolling walker (2 wheeled) Transfers: Sit to/from [facility] Sit to Stand: Mod assist (5x) Stand pivot transfers: Max assist       General transfer comment: pt tolerates sit>stand transfer  5x from recliner with mod A and cues for increased trunk flexion to initate. Once standing, pt requires cues for trunk and hip extension for upright posture and to bring center of mass over BOS. Pt requires max A for weightshift to accomplish stand>pivot transfer.  Ambulation/Gait                 Stairs             Wheelchair Mobility    Modified Rankin (Stroke Patients Only)       Balance Overall balance assessment: Needs assistance Sitting-balance support: Feet supported Sitting balance-Leahy Scale: Fair   Postural control: Posterior lean Standing balance support: During functional activity Standing balance-Leahy Scale: Poor Standing balance comment: pt reliant on external support to stand                            Cognition Arousal/Alertness: Awake/alert Behavior During Therapy: WFL for tasks assessed/performed Overall Cognitive Status: Within Functional Limits for tasks assessed                                        Exercises      General Comments General comments (skin integrity, edema, etc.): pt on 2 L satting at 99% on arrival. pt weaned to RA and satting at 95% at rest. pt desats to 87% with mobility, requiring 2 L to maintain o2 sats.      Pertinent Vitals/Pain Pain Assessment: No/denies pain    Home Living  Prior Function            PT Goals (current goals can now be found in the care plan section) Acute Rehab PT Goals Patient Stated Goal: improve independence Progress towards PT goals: Progressing toward goals    Frequency    Min 3X/week (pending SNF placement)      PT Plan Current plan remains appropriate    Co-evaluation              AM-PAC PT "6 Clicks" Mobility   Outcome Measure  Help needed turning from your back to your side while in a flat bed without using bedrails?: A Little Help needed moving from lying on your back to sitting on the side of a flat bed  without using bedrails?: Total Help needed moving to and from a bed to a chair (including a wheelchair)?: Total Help needed standing up from a chair using your arms (e.g., wheelchair or bedside chair)?: Total Help needed to walk in hospital room?: Total Help needed climbing 3-5 steps with a railing? : Total 6 Click Score: 8    End of Session Equipment Utilized During Treatment: Gait belt;Oxygen Activity Tolerance: Patient limited by fatigue Patient left: in bed;with call bell/phone within reach;Other (comment) (pt left in bed for echo to be performed with technician.) Nurse Communication: Mobility status PT Visit Diagnosis: Unsteadiness on feet (R26.81);Muscle weakness (generalized) (M62.81)     Time: 0211-1552 PT Time Calculation (min) (ACUTE ONLY): 40 min  Charges:  $Therapeutic Activity: 38-52 mins                     Acute Rehab  Pager: [phone]    Garwin Brothers, SPT  02/20/2021, 3:24 PM

## 2021-02-20 NOTE — Progress Notes (Signed)
PROGRESS NOTE    [name]  CZY:[id number] DOB: [birth year] DOA: 02/12/2021 PCP: [name], [name], NP    Brief Narrative:  [name] was admitted to the hospital with the working diagnosis of pelvic fracture, complicated with acute blood loss anemia (hematoma) in the setting of decompensated heart failure.    87 year old male past medical history for hypothyroidism, chronic anxiety/depression, hypertension, atrial fibrillation, heart failure and ambulatory dysfunction who presented after a mechanical fall.  He fell backwards after standing from his chair.  Reported frequent falls over the last 3 months.  On his initial physical examination his temperature was 98.2, blood pressure 101/56, heart rate 97, respiratory rate 21, oxygen saturation 100% on room air.  His lungs were clear to auscultation, no wheezing or rales, heart S1-S2, present, rhythmic, soft abdomen, no lower extremity edema.   Sodium 126, potassium 4.2, chloride 90, bicarb 27, glucose 126, BUN 48, creatinine 2.10, white count 11.3, hemoglobin 10.9, hematocrit 34.2, platelets 188. SARS COVID-19 negative.   Head CT negative for acute changes. Cervical CT no acute fracture. (Acute/subacute mild compression fractures T1-T2 superior endplate)   Pelvic CT with acute displaced fractures involving the left superior and inferior pubic rami.   Chest radiograph my cardiomegaly, bilateral peripheral reticular markings.69   EKG 111 bpm, left axis deviation, right bundle branch block, atrial fibrillation, q wave lead II, lead III, aVF, V1-V3, poor R wave progression, no ST segment or T wave changes.   Patient developed hypotension and worsening anemia.  Further work-up with CT abdomen/pelvis showed a large right hip hematoma. Received packed red blood cell transfusions and vitamin K to reverse effects of warfarin.   Total of 3 units packed red blood cells transfused.   Worsening edema consistent with decompensated heart failure,  placed on intravenous furosemide.   Assessment & Plan:   Principal Problem:   Closed fracture of multiple pubic rami [facility]) Active Problems:   Fall   Pelvic fracture (HCC)   Acute blood loss anemia   Traumatic hematoma of right hip   AKI (acute kidney injury) ([place])   CKD (chronic kidney disease), stage III (HCC)   Atrial fibrillation, chronic (HCC)   Malnutrition of moderate degree   Acute left pelvic fracture (traumatic), complicated with intra-abdominal hematoma, acute blood loss anemia. SP 3 units PRBC transfusion. SP vitamin K. Continue pain control with acetaminophen and oxycodone, today he is out of bed to chair. Continue to be very weak and deconditioned.  Follow up Hgb is 8,9 with Hct at 26,9.    Continue to hold on anticoagulation [facility] to transfer to SNF when medically stable. Will need repeat radiographs in 3 weeks as outpatient.    2. Acute decompensation of systolic heart failure/ hypotension.  Urine output is 1,716 over last 24 hrs.   Further work up with echocardiogram to assess LV systolic function.  Continue torsemide for now.     3. AKI on CKD stage 3b, hyponatremia/ hypokalemia. Na is up to 129, with K at 3,4 and serum bicarbonate at 28 with cr at 2,02   Add 40 meq Kcl today and will continue close follow up of renal function and electrolytes.    4. Chronic atrial fibrillation. On metoprolol for rate control, for now will continue to hold on anticoagulation due to recent intra-abdominal hematoma.    5. Hypothyroid. On levothyroxine   6. Dyslipidemia. On atorvastatin.   7. Moderate calorie protein malnutrition. Continue with nutritional supplements.   Patient continue to be at high risk  for worsening heart failure   Status is: Inpatient  Remains inpatient appropriate because:Inpatient level of care appropriate due to severity of illness  Dispo:  Patient From: [place]  Planned Disposition: [place]  Medically stable  for discharge: No     DVT prophylaxis: Scd   Code Status:   DNR   Family Communication:  No family at the bedside      Nutrition Status: Nutrition Problem: Moderate Malnutrition Etiology: chronic illness (CHF) Signs/Symptoms: moderate fat depletion, severe muscle depletion Interventions: Ensure Enlive (each supplement provides 350kcal and 20 grams of protein), Other (Comment) (downgrade diet, feeding assist)     Consultants:  Orthopedics     Subjective: Patient continue to be very weak and deconditioned, dyspnea and lower extremity edema are improving but not yet back to baseline.   Objective: Vitals:   02/19/21 2300 02/20/21 0443 02/20/21 0445 02/20/21 1135  BP:  (!) 122/59  106/63  Pulse:  (!) 118  99  Resp:  16  18  Temp:  98.6 F (37 C)  98.1 F (36.7 C)  TempSrc:  Oral  Oral  SpO2:  100%  94%  Weight: 78.3 kg  77.7 kg   Height:        Intake/Output Summary (Last 24 hours) at 02/20/2021 1302 Last data filed at 02/20/2021 0912 Gross per 24 hour  Intake 657 ml  Output 1716 ml  Net -1059 ml   Filed Weights   02/18/21 2357 02/19/21 2300 02/20/21 0445  Weight: 77.4 kg 78.3 kg 77.7 kg    Examination:   General: Not in pain or dyspnea, deconditioned  Neurology: Awake and alert, non focal  E ENT: mild pallor, no icterus, oral mucosa moist Cardiovascular: No JVD. S1-S2 present, rhythmic, no gallops, rubs, or murmurs. +2 pitting bilareral lower extremity edema. Pulmonary: positive breath sounds bilaterally,  no wheezing, or rhonchi positive bilateral diffuse rales. Gastrointestinal. Abdomen soft and non tender Skin. No rashes Musculoskeletal: no joint deformities     Data Reviewed: I have personally reviewed following labs and imaging studies  CBC: Recent Labs  Lab 02/15/21 0141 02/15/21 0753 02/16/21 0227 02/16/21 1304 02/17/21 0407 02/17/21 1244 02/18/21 0144 02/19/21 0433 02/20/21 0302  WBC 11.8*  --  11.3*  --  9.6  --  12.7* 9.3  --   HGB  6.5*   < > 6.9*   < > 7.9* 8.7* 8.2* 8.6* 8.9*  HCT 19.5*   < > 20.9*   < > 23.4* 25.8* 25.0* 26.0* 26.9*  MCV 89.0  --  90.5  --  91.8  --  92.6 93.9  --   PLT 142*  --  137*  --  141*  --  157 160  --    < > = values in this interval not displayed.   Basic Metabolic Panel: Recent Labs  Lab 02/16/21 0227 02/17/21 0407 02/18/21 0144 02/19/21 0433 02/20/21 0302  NA 125* 128* 128* 128* 129*  K 4.0 3.5 3.6 3.1* 3.4*  CL 89* 88* 89* 89* 90*  CO2 27 30 29 24 28   GLUCOSE 125* 97 117* 87 92  BUN 61* 60* 64* 62* 64*  CREATININE 2.28* 2.23* 2.24* 2.07* 2.02*  CALCIUM 7.8* 8.0* 8.2* 8.0* 8.2*   GFR: Estimated Creatinine Clearance: 20 mL/min (A) (by C-G formula based on SCr of 2.02 mg/dL (H)). Liver Function Tests: No results for input(s): AST, ALT, ALKPHOS, BILITOT, PROT, ALBUMIN in the last 168 hours. No results for input(s): LIPASE, AMYLASE  in the last 168 hours. No results for input(s): AMMONIA in the last 168 hours. Coagulation Profile: Recent Labs  Lab 02/15/21 0141 02/16/21 0227 02/17/21 0407  INR 2.1* 1.7* 1.5*   Cardiac Enzymes: No results for input(s): CKTOTAL, CKMB, CKMBINDEX, TROPONINI in the last 168 hours. BNP (last 3 results) No results for input(s): PROBNP in the last 8760 hours. HbA1C: No results for input(s): HGBA1C in the last 72 hours. CBG: No results for input(s): GLUCAP in the last 168 hours. Lipid Profile: No results for input(s): CHOL, HDL, LDLCALC, TRIG, CHOLHDL, LDLDIRECT in the last 72 hours. Thyroid Function Tests: No results for input(s): TSH, T4TOTAL, FREET4, T3FREE, THYROIDAB in the last 72 hours. Anemia Panel: No results for input(s): VITAMINB12, FOLATE, FERRITIN, TIBC, IRON, RETICCTPCT in the last 72 hours.    Radiology Studies: I have reviewed all of the imaging during this hospital visit personally     Scheduled Meds:  clotrimazole  1 application Topical BID   docusate sodium  100 mg Oral BID   feeding supplement  237 mL Oral TID  BM   fluticasone  2 spray Each Nare Daily   levothyroxine  88 mcg Oral QAC breakfast   metoprolol succinate  25 mg Oral Daily   pantoprazole  40 mg Oral Daily   simvastatin  10 mg Oral QHS   torsemide  40 mg Oral Daily   Continuous Infusions:   LOS: 7 days        Radin Raptis Gerome Apley, MD

## 2021-02-20 NOTE — Progress Notes (Signed)
   02/20/21 1045  Mobility  Activity Transferred:  Bed to chair  Range of Motion/Exercises Active;All extremities  Level of Assistance +2 (takes two people) (Max A EOB; Max A + 2 sit/stand)  Assistive Device Other (Comment) (+ 2 arms held and gait belt)  RLE Weight Bearing WBAT  Mobility Out of bed to chair with meals  Mobility Response Tolerated well  Mobility performed by Mobility specialist;Nurse tech  Bed Position Chair  $Mobility charge 1 Mobility   Patient tolerated transferring to chair well without complaint of pain. Required Max A to EOB from supine>sit and Max A + 2 sit>stand>pivot to chair. Was left seated in recliner chair with all needs met and NT present.

## 2021-02-20 NOTE — Care Management Important Message (Signed)
Important Message  Patient Details  Name: [name] MRN: [id number] Date of Birth: Feb 24, 1920   Medicare Important Message Given:  Yes     Shelda Altes 02/20/2021, 7:35 AM

## 2021-02-21 LAB — BASIC METABOLIC PANEL
Anion gap: 7 (ref 5–15)
BUN: 66 mg/dL — ABNORMAL HIGH (ref 8–23)
CO2: 32 mmol/L (ref 22–32)
Calcium: 8.2 mg/dL — ABNORMAL LOW (ref 8.9–10.3)
Chloride: 91 mmol/L — ABNORMAL LOW (ref 98–111)
Creatinine, Ser: 1.98 mg/dL — ABNORMAL HIGH (ref 0.61–1.24)
GFR, Estimated: 30 mL/min — ABNORMAL LOW (ref >60)
Glucose, Bld: 123 mg/dL — ABNORMAL HIGH (ref 70–99)
Potassium: 3.2 mmol/L — ABNORMAL LOW (ref 3.5–5.1)
Sodium: 130 mmol/L — ABNORMAL LOW (ref 135–145)

## 2021-02-21 LAB — MAGNESIUM: Magnesium: 2.2 mg/dL (ref 1.7–2.4)

## 2021-02-21 MED ORDER — POTASSIUM CHLORIDE CRYS ER 20 MEQ PO TBCR
40.0000 meq | EXTENDED_RELEASE_TABLET | Freq: Once | ORAL | Status: AC
  Administered 2021-02-21: 40 meq via ORAL
  Filled 2021-02-21: qty 2

## 2021-02-21 MED ORDER — FUROSEMIDE 10 MG/ML IJ SOLN
80.0000 mg | Freq: Two times a day (BID) | INTRAMUSCULAR | Status: DC
  Administered 2021-02-21 – 2021-02-22 (×3): 80 mg via INTRAVENOUS
  Filled 2021-02-21 (×3): qty 8

## 2021-02-21 MED ORDER — METOLAZONE 5 MG PO TABS
5.0000 mg | ORAL_TABLET | Freq: Once | ORAL | Status: AC
  Administered 2021-02-21: 5 mg via ORAL
  Filled 2021-02-21: qty 1

## 2021-02-21 NOTE — Progress Notes (Signed)
  Mobility Specialist Criteria Algorithm Info.  Mobility Team: HOB elevated: Activity: Transferred:  Bed to chair Range of motion: Active; All extremities Level of assistance: Maximum assist, patient does 25-49% Assistive device: Other (Comment) (Arms Held + gait belt) Minutes sitting in chair:  Minutes stood: 1 minutes Minutes ambulated: 1 minutes Distance ambulated (ft):  Mobility response: Tolerated well Bed Position: Chair  Patient agreed to transfer to chair and participate in mobility. Pt doing better with initiating movement requiring cues to place hands on bed rail to pull and mod/max assist to slide LE's to EOB. Performed sit/stand x2 with max assist and max A squat pivot to chair. Tolerated ambulation well without incident or complaint and was left sitting in recliner chair with all need met and RN present.   02/21/2021 3:20 PM

## 2021-02-21 NOTE — Progress Notes (Signed)
PROGRESS NOTE    [name]  TGG:[id number] DOB: 20-Sep-1919 DOA: 02/12/2021 PCP: [name], [name], NP    Brief Narrative:  [name] was admitted to the hospital with the working diagnosis of pelvic fracture, complicated with acute blood loss anemia (hematoma) in the setting of decompensated heart failure.    87 year old male past medical history for hypothyroidism, chronic anxiety/depression, hypertension, atrial fibrillation, heart failure and ambulatory dysfunction who presented after a mechanical fall.  He fell backwards after standing from his chair.  Reported frequent falls over the last 3 months.  On his initial physical examination his temperature was 98.2, blood pressure 101/56, heart rate 97, respiratory rate 21, oxygen saturation 100% on room air.  His lungs were clear to auscultation, no wheezing or rales, heart S1-S2, present, rhythmic, soft abdomen, no lower extremity edema.   Sodium 126, potassium 4.2, chloride 90, bicarb 27, glucose 126, BUN 48, creatinine 2.10, white count 11.3, hemoglobin 10.9, hematocrit 34.2, platelets 188. SARS COVID-19 negative.   Head CT negative for acute changes. Cervical CT no acute fracture. (Acute/subacute mild compression fractures T1-T2 superior endplate)   Pelvic CT with acute displaced fractures involving the left superior and inferior pubic rami.   Chest radiograph my cardiomegaly, bilateral peripheral reticular markings.69   EKG 111 bpm, left axis deviation, right bundle branch block, atrial fibrillation, q wave lead II, lead III, aVF, V1-V3, poor R wave progression, no ST segment or T wave changes.   Patient developed hypotension and worsening anemia.  Further work-up with CT abdomen/pelvis showed a large right hip hematoma. Received packed red blood cell transfusions and vitamin K to reverse effects of warfarin.   Total of 3 units packed red blood cells transfused.   Worsening edema consistent with decompensated heart failure,  placed on intravenous furosemide.     Assessment & Plan:   Principal Problem:   Closed fracture of multiple pubic rami [facility]) Active Problems:   Fall   Pelvic fracture (HCC)   Acute blood loss anemia   Traumatic hematoma of right hip   AKI (acute kidney injury) ([place])   CKD (chronic kidney disease), stage III (HCC)   Atrial fibrillation, chronic (HCC)   Malnutrition of moderate degree   Acute left pelvic fracture (traumatic), complicated with intra-abdominal hematoma, acute blood loss anemia. SP 3 units PRBC transfusion. SP vitamin K. ON acetaminophen and oxycodone for pain control.    Holding  on anticoagulation Repeat radiographs in 3 weeks as outpatient.  Not medically stable for transfer yet.    2. Acute decompensation of systolic heart failure/ hypotension. Pulmonary hypertension class 2 (acute on chronic core pulmonale)  Urine output is 1,875 over last 24 hrs. He has unna boots, but significant pitting edema up to the hips and scrotum. Positive JVD Portable ultrasound was used to evaluate: Dyspnea/hypoxia and Ejection fraction. Positive B lines on lung examination with reduction in LV systolic function. Echocardiogram with EF 40 to 45%, with global hypokinesis, mild reduction on RV systolic function, elevated pulmonary hypertension RSVP 54.7 mmHg   Plan to increase furosemide to 60 mg IV q12 hrs to further target negative fluid balance.  Add one dose of metolazone.  Follow up respond to diuresis.    3. AKI on CKD stage 3b, hyponatremia/ hypokalemia.  Continue to have significant hypervolemia. Serum cr is 1.98 with K at 3,2 and serum bicarbonate at 32, BUN 66 Add total of 80 meq Kcl today.   Change torsemide to IV furosemide( 60 mg Iv q12) and add  one dose of metolazone. Follow up renal function in am, avoid hypotension.    4. Chronic atrial fibrillation. Continue rate control with metoprolol . Continue holding anticoagulation for now due to hematoma.    5.  Hypothyroid. Continue with levothyroxine   6. Dyslipidemia. Continue with atorvastatin.    7. Moderate calorie protein malnutrition. Nutritional supplements.     Patient continue to be at high risk for worsening heart failure   Status is: Inpatient  Remains inpatient appropriate because:IV treatments appropriate due to intensity of illness or inability to take PO  Dispo:  Patient From: [place]  Planned Disposition: [place]  Medically stable for discharge: No     DVT prophylaxis: Scd   Code Status:   DNR   Family Communication:  I called his daughter and left a message, not able to reach her at this time.      Nutrition Status: Nutrition Problem: Moderate Malnutrition Etiology: chronic illness (CHF) Signs/Symptoms: moderate fat depletion, severe muscle depletion Interventions: Ensure Enlive (each supplement provides 350kcal and 20 grams of protein), Other (Comment) (downgrade diet, feeding assist)    Subjective: Patient continue to be very weak and deconditioned, significant edema lower extremities and scrotum   Objective: Vitals:   02/20/21 2300 02/21/21 0018 02/21/21 0421 02/21/21 1128  BP:   125/60 128/75  Pulse:   82 98  Resp: 17  17 19   Temp:   97.6 F (36.4 C) 97.9 F (36.6 C)  TempSrc:   Oral Oral  SpO2:   100% 100%  Weight:  76.4 kg    Height:        Intake/Output Summary (Last 24 hours) at 02/21/2021 1149 Last data filed at 02/21/2021 1007 Gross per 24 hour  Intake 660 ml  Output 2175 ml  Net -1515 ml   Filed Weights   02/19/21 2300 02/20/21 0445 02/21/21 0018  Weight: 78.3 kg 77.7 kg 76.4 kg    Examination:   General: deconditioned and ill looking appearing  Neurology: Awake and alert, non focal  E ENT: mild pallor, no icterus, oral mucosa moist Cardiovascular: No JVD. S1-S2 present, rhythmic, no gallops, rubs, or murmurs. No lower extremity edema. Pulmonary: positive breath sounds bilaterally, with no  wheezing, scattered rhonchi and rales. Gastrointestinal. Abdomen soft and non tender Skin. No rashes Musculoskeletal: no joint deformities     Data Reviewed: I have personally reviewed following labs and imaging studies  CBC: Recent Labs  Lab 02/15/21 0141 02/15/21 0753 02/16/21 0227 02/16/21 1304 02/17/21 0407 02/17/21 1244 02/18/21 0144 02/19/21 0433 02/20/21 0302  WBC 11.8*  --  11.3*  --  9.6  --  12.7* 9.3  --   HGB 6.5*   < > 6.9*   < > 7.9* 8.7* 8.2* 8.6* 8.9*  HCT 19.5*   < > 20.9*   < > 23.4* 25.8* 25.0* 26.0* 26.9*  MCV 89.0  --  90.5  --  91.8  --  92.6 93.9  --   PLT 142*  --  137*  --  141*  --  157 160  --    < > = values in this interval not displayed.   Basic Metabolic Panel: Recent Labs  Lab 02/17/21 0407 02/18/21 0144 02/19/21 0433 02/20/21 0302 02/21/21 0309  NA 128* 128* 128* 129* 130*  K 3.5 3.6 3.1* 3.4* 3.2*  CL 88* 89* 89* 90* 91*  CO2 30 29 24 28  32  GLUCOSE 97 117* 87 92 123*  BUN 60* 64*  62* 64* 66*  CREATININE 2.23* 2.24* 2.07* 2.02* 1.98*  CALCIUM 8.0* 8.2* 8.0* 8.2* 8.2*  MG  --   --   --   --  2.2   GFR: Estimated Creatinine Clearance: 18.8 mL/min (A) (by C-G formula based on SCr of 1.98 mg/dL (H)). Liver Function Tests: No results for input(s): AST, ALT, ALKPHOS, BILITOT, PROT, ALBUMIN in the last 168 hours. No results for input(s): LIPASE, AMYLASE in the last 168 hours. No results for input(s): AMMONIA in the last 168 hours. Coagulation Profile: Recent Labs  Lab 02/15/21 0141 02/16/21 0227 02/17/21 0407  INR 2.1* 1.7* 1.5*   Cardiac Enzymes: No results for input(s): CKTOTAL, CKMB, CKMBINDEX, TROPONINI in the last 168 hours. BNP (last 3 results) No results for input(s): PROBNP in the last 8760 hours. HbA1C: No results for input(s): HGBA1C in the last 72 hours. CBG: No results for input(s): GLUCAP in the last 168 hours. Lipid Profile: No results for input(s): CHOL, HDL, LDLCALC, TRIG, CHOLHDL, LDLDIRECT in the last  72 hours. Thyroid Function Tests: No results for input(s): TSH, T4TOTAL, FREET4, T3FREE, THYROIDAB in the last 72 hours. Anemia Panel: No results for input(s): VITAMINB12, FOLATE, FERRITIN, TIBC, IRON, RETICCTPCT in the last 72 hours.    Radiology Studies: I have reviewed all of the imaging during this hospital visit personally     Scheduled Meds:  clotrimazole  1 application Topical BID   docusate sodium  100 mg Oral BID   feeding supplement  237 mL Oral TID BM   fluticasone  2 spray Each Nare Daily   furosemide  80 mg Intravenous Q12H   levothyroxine  88 mcg Oral QAC breakfast   metoprolol succinate  25 mg Oral Daily   pantoprazole  40 mg Oral Daily   simvastatin  10 mg Oral QHS   Continuous Infusions:   LOS: 8 days        Brave Dack Gerome Apley, MD

## 2021-02-21 NOTE — Progress Notes (Signed)
K+ 3.2. MD paged.

## 2021-02-21 NOTE — Therapy (Signed)
Occupational Therapy Treatment Patient Details Name: Trayon Krantz MRN: [id number] DOB: 07-22-1920 Today's Date: 02/21/2021    History of present illness This 87 y.o. male admitted after a fall at ALF (third fall in 3 months).  He sustained acute displaced fractures involving Lt superior and inferior pubic rami and acute subacute mild compression fractures T1 and T2.  Ortho consulted and pt ok for WBAT. PMH includes: hypothyroidism, chronic anxiety/depression, hypertension, permanent Afib on Coumadin,  chronic diastolic CHF   OT comments  Pt seen for OT ADL retraining session with focus on bed mobility, ADL grooming & bathing sitting up at EOB, functional transfers sit to stand x5 using steady in preparation for increased participation & activity tolerance with ADL's. Pt tolerated treatment nicely today.   Follow Up Recommendations  SNF;Supervision/Assistance - 24 hour    Equipment Recommendations  Other (comment) (Defer to next venue)       Precautions / Restrictions Precautions Precautions: Fall Precaution Comments: h/o falls Restrictions RLE Weight Bearing: Weight bearing as tolerated       Mobility Bed Mobility Overal bed mobility: Needs Assistance Bed Mobility: Supine to Sit;Sit to Supine     Supine to sit: Mod assist;HOB elevated Sit to supine: Mod assist (HOB flat and slightly inverted to assist with scooting pt to [facility]. Then HOB elevated.)   General bed mobility comments: mod A for LEs and to scoot up in bed    Transfers Overall transfer level: Needs assistance Equipment used:  (Stedy, gait belt, O2) Transfers: Sit to/from Stand Sit to Stand: Min assist (x5 using steady after grooming/bathe sitting EOB)         General transfer comment: Pt performed sit to stand transfer 5x from EOB using steady. VC's& TC's for trunk and hip extension for upright posture and to bring center of mass over BOS.    Balance Overall balance assessment: Needs  assistance Sitting-balance support: Feet supported Sitting balance-Leahy Scale: Fair     Standing balance support: Bilateral upper extremity supported;During functional activity (Sit to stand using Steady) Standing balance-Leahy Scale: Fair                             ADL either performed or assessed with clinical judgement   ADL Overall ADL's : Needs assistance/impaired     Grooming: Wash/dry hands;Wash/dry face;Oral care;Set up;Sitting (Sitting at EOB today (pt declined standing at sink w/ Steady but was agreeable to sitting EOB).)   Upper Body Bathing: Set up;Sitting;Minimal assistance (Sitting EOB)       Upper Body Dressing : Minimal assistance;Sitting       Toilet Transfer:  (Sitting at EOB and using Steady for sit to stand transfers x5  w/ focus on endurance/activity tolerance in preparation for increased participation w/ toliet transfers.)             General ADL Comments: Steady for sit to stand practice x5 in prep for ADL transfers, as well as body mechanics and acheiving optimal posture for increased indepdendence w/ LB ADL's                       Cognition Arousal/Alertness: Awake/alert Behavior During Therapy: WFL for tasks assessed/performed Overall Cognitive Status: Within Functional Limits for tasks assessed             General Comments: grossly WFL for tasks assessed  General Comments Pt reported initial light headedness when sitting at EOB that resolved/improved after ~2-3 min. Pt with noted low potassium via chart review but RN ok'd therapy session as pt had been given meds.    Pertinent Vitals/ Pain       Pain Assessment: No/denies pain Pain Score: 0-No pain   Frequency  Min 2X/week        Progress Toward Goals  OT Goals(current goals can now be found in the care plan section)  Progress towards OT goals: Progressing toward goals  Acute Rehab OT Goals Patient Stated Goal: Get better   Plan Discharge plan remains appropriate;Frequency remains appropriate       AM-PAC OT "6 Clicks" Daily Activity     Outcome Measure   Help from another person eating meals?: A Little Help from another person taking care of personal grooming?: A Little Help from another person toileting, which includes using toliet, bedpan, or urinal?: Total Help from another person bathing (including washing, rinsing, drying)?: A Lot Help from another person to put on and taking off regular upper body clothing?: A Little Help from another person to put on and taking off regular lower body clothing?: Total 6 Click Score: 13    End of Session Equipment Utilized During Treatment: Gait belt;Oxygen;Other (comment) (Steady)  OT Visit Diagnosis: Unsteadiness on feet (R26.81);Pain   Activity Tolerance Patient tolerated treatment well   Patient Left in bed;with call bell/phone within reach;with bed alarm set   Nurse Communication Mobility status;Other (comment) (ADL's completed)        Time: 6004-5997 OT Time Calculation (min): 56 min  Charges: OT General Charges $OT Visit: 1 Visit OT Treatments $Self Care/Home Management : 38-52 mins $Therapeutic Activity: 8-22 mins   Lanina Larranaga Beth Dixon, OTR/L 02/21/2021, 12:34 PM

## 2021-02-22 LAB — BASIC METABOLIC PANEL
Anion gap: 11 (ref 5–15)
BUN: 72 mg/dL — ABNORMAL HIGH (ref 8–23)
CO2: 31 mmol/L (ref 22–32)
Calcium: 8.4 mg/dL — ABNORMAL LOW (ref 8.9–10.3)
Chloride: 91 mmol/L — ABNORMAL LOW (ref 98–111)
Creatinine, Ser: 2.03 mg/dL — ABNORMAL HIGH (ref 0.61–1.24)
GFR, Estimated: 29 mL/min — ABNORMAL LOW (ref >60)
Glucose, Bld: 108 mg/dL — ABNORMAL HIGH (ref 70–99)
Potassium: 3.6 mmol/L (ref 3.5–5.1)
Sodium: 133 mmol/L — ABNORMAL LOW (ref 135–145)

## 2021-02-22 LAB — MAGNESIUM: Magnesium: 2.1 mg/dL (ref 1.7–2.4)

## 2021-02-22 MED ORDER — BISACODYL 10 MG RE SUPP
10.0000 mg | Freq: Once | RECTAL | Status: AC
  Administered 2021-02-22: 10 mg via RECTAL
  Filled 2021-02-22: qty 1

## 2021-02-22 NOTE — Progress Notes (Signed)
Physical Therapy Treatment Patient Details Name: [name] MRN: [id number] DOB: 1919-12-03 Today's Date: 02/22/2021    History of Present Illness This 87 y.o. male admitted after a fall at ALF (third fall in 3 months).  He sustained acute displaced fractures involving Lt superior and inferior pubic rami and acute subacute mild compression fractures T1 and T2.  Ortho consulted and pt ok for WBAT. PMH includes: hypothyroidism, chronic anxiety/depression, hypertension, permanent Afib on Coumadin,  chronic diastolic CHF    PT Comments    Pt limited by weakness, fatigue, and need for bowel movement this session. Pt continues to require significant physical assistance to transfer at this time with tendency for a strong posterior lean. Pt with new onset sharp R elbow pain associated with 2 audible pops when weightbearing through RW in standing, RN and MD both made aware. Pt will benefit from continued acute PT services to improve activity tolerance and reduce falls risk.   Follow Up Recommendations  SNF     Equipment Recommendations  Wheelchair (measurements PT);Wheelchair cushion (measurements PT);Other (comment) (mechanical lift)    Recommendations for Other Services       Precautions / Restrictions Precautions Precautions: Fall Precaution Comments: h/o falls Restrictions Weight Bearing Restrictions: Yes RLE Weight Bearing: Weight bearing as tolerated    Mobility  Bed Mobility Overal bed mobility: Needs Assistance Bed Mobility: Supine to Sit     Supine to sit: Min assist;HOB elevated     General bed mobility comments: use of rails, assist to pivot hips    Transfers Overall transfer level: Needs assistance Equipment used: Rolling walker (2 wheeled) Transfers: Sit to/from [facility] Sit to Stand: Mod assist Stand pivot transfers: Max assist       General transfer comment: posterior lean, pt on heels in standing with posterior lean, unable to maintain  balance without physical assist  Ambulation/Gait                 Stairs             Wheelchair Mobility    Modified Rankin (Stroke Patients Only)       Balance Overall balance assessment: Needs assistance Sitting-balance support: Single extremity supported;Bilateral upper extremity supported;Feet supported Sitting balance-Leahy Scale: Poor Sitting balance - Comments: reliant on UE support Postural control: Posterior lean Standing balance support: Bilateral upper extremity supported Standing balance-Leahy Scale: Poor Standing balance comment: modA with posterior lean, BUE support of RW                            Cognition Arousal/Alertness: Awake/alert Behavior During Therapy:  (depressed mood this session) Overall Cognitive Status: Within Functional Limits for tasks assessed                                        Exercises General Exercises - Lower Extremity Ankle Circles/Pumps: AROM;Both;10 reps Long Arc Quad: AROM;Both;10 reps Hip Flexion/Marching: AROM;Both;20 reps    General Comments General comments (skin integrity, edema, etc.): VSS on RA      Pertinent Vitals/Pain Pain Assessment: 0-10 Pain Score: 6  Pain Location: R elbow Pain Descriptors / Indicators: Sharp Pain Intervention(s):  (RN and MD made aware, sharp pain in R elbow associated with 2 pops heard by PT when weightbearing through RW)    Home Living  Prior Function            PT Goals (current goals can now be found in the care plan section) Acute Rehab PT Goals Patient Stated Goal: Get better Progress towards PT goals: Not progressing toward goals - comment (limited by pain, fatigue, need for bowel movement)    Frequency    Min 3X/week      PT Plan Current plan remains appropriate    Co-evaluation              AM-PAC PT "6 Clicks" Mobility   Outcome Measure  Help needed turning from your back to your side  while in a flat bed without using bedrails?: A Little Help needed moving from lying on your back to sitting on the side of a flat bed without using bedrails?: A Lot Help needed moving to and from a bed to a chair (including a wheelchair)?: A Lot Help needed standing up from a chair using your arms (e.g., wheelchair or bedside chair)?: A Lot Help needed to walk in hospital room?: Total Help needed climbing 3-5 steps with a railing? : Total 6 Click Score: 11    End of Session Equipment Utilized During Treatment: Gait belt Activity Tolerance: Patient limited by pain;Patient limited by fatigue Patient left: Other (comment);with call bell/phone within reach (left on bedside commode, RN aware and supervising) Nurse Communication: Mobility status PT Visit Diagnosis: Unsteadiness on feet (R26.81);Muscle weakness (generalized) (M62.81)     Time: 5188-4166 PT Time Calculation (min) (ACUTE ONLY): 42 min  Charges:  $Therapeutic Exercise: 8-22 mins $Therapeutic Activity: 8-22 mins                     Zenaida Niece, PT, DPT Acute Rehabilitation Pager: [phone]    Zenaida Niece 02/22/2021, 12:38 PM

## 2021-02-22 NOTE — Progress Notes (Signed)
PROGRESS NOTE    [name]  WNI:[id number] DOB: July 12, 1920 DOA: 02/12/2021 PCP: [name], [name], NP    Brief Narrative:  [name] was admitted to the hospital with the working diagnosis of pelvic fracture, complicated with acute blood loss anemia (hematoma) in the setting of decompensated heart failure.    87 year old male past medical history for hypothyroidism, chronic anxiety/depression, hypertension, atrial fibrillation, heart failure and ambulatory dysfunction who presented after a mechanical fall.  He fell backwards after standing from his chair.  Reported frequent falls over the last 3 months.  On his initial physical examination his temperature was 98.2, blood pressure 101/56, heart rate 97, respiratory rate 21, oxygen saturation 100% on room air.  His lungs were clear to auscultation, no wheezing or rales, heart S1-S2, present, rhythmic, soft abdomen, no lower extremity edema.   Sodium 126, potassium 4.2, chloride 90, bicarb 27, glucose 126, BUN 48, creatinine 2.10, white count 11.3, hemoglobin 10.9, hematocrit 34.2, platelets 188. SARS COVID-19 negative.   Head CT negative for acute changes. Cervical CT no acute fracture. (Acute/subacute mild compression fractures T1-T2 superior endplate)   Pelvic CT with acute displaced fractures involving the left superior and inferior pubic rami.   Chest radiograph my cardiomegaly, bilateral peripheral reticular markings.69   EKG 111 bpm, left axis deviation, right bundle branch block, atrial fibrillation, q wave lead II, lead III, aVF, V1-V3, poor R wave progression, no ST segment or T wave changes.   Patient developed hypotension and worsening anemia.  Further work-up with CT abdomen/pelvis showed a large right hip hematoma. Received packed red blood cell transfusions and vitamin K to reverse effects of warfarin.   Total of 3 units packed red blood cells transfused.   Worsening edema consistent with decompensated heart failure,  placed on intravenous furosemide.   Assessment & Plan:   Principal Problem:   Closed fracture of multiple pubic rami [facility]) Active Problems:   Fall   Pelvic fracture (HCC)   Acute blood loss anemia   Traumatic hematoma of right hip   AKI (acute kidney injury) ([place])   CKD (chronic kidney disease), stage III (HCC)   Atrial fibrillation, chronic (HCC)   Malnutrition of moderate degree     Acute left pelvic fracture (traumatic), complicated with intra-abdominal hematoma, acute blood loss anemia. SP 3 units PRBC transfusion. SP vitamin K. Continue with acetaminophen and oxycodone for pain control.  Repeat radiographs in 3 weeks as outpatient.     2. Acute decompensation of systolic heart failure/ hypotension. Pulmonary hypertension class 2 (acute on chronic core pulmonale)   Urine output is 2,775 over last 24 hrs. Continue to have unna boots, persistent  pitting edema up to the hips and scrotum. Positive severe JVD  Echocardiogram with EF 40 to 45%, with global hypokinesis, mild reduction on RV systolic function, elevated pulmonary hypertension RSVP 54.7 mmHg   Blood pressure systolic now down to 38'H.  Continue current regimen of diuresis for now (furosemide 80 mg IV q12), to prevent hypotension. Had metolazone on 07/15 Patient may need inotropic support to allow further diuresis.  Continue close monitor of blood pressure.   3. AKI on CKD stage 3b, hyponatremia/ hypokalemia.  Positive hypervolemia.  Na is 133, K is 3,6 and serum bicarbonate at 31, cr is 2,0  Urine output over 2 L. Will continue current dose of furosemide for now, due to hypotension. If no improvement will need inotropic support.    4. Chronic atrial fibrillation. On metoprolol  for rate control. Rate  in the 90 to 100. No anticoagulation due to hematoma.    5. Hypothyroid. On Levothyroxine   6. Dyslipidemia. On atorvastatin.    7. Moderate calorie protein malnutrition. Continue with nutritional  supplements.    Patient continue to be at high risk for worsening heart failure   Status is: Inpatient  Remains inpatient appropriate because:IV treatments appropriate due to intensity of illness or inability to take PO  Dispo:  Patient From: [place]  Planned Disposition: [place]  Medically stable for discharge: No     DVT prophylaxis: Scd   Code Status:   DNR   Family Communication:  I spoke over the phone with the patient's son in law about patient's  condition, plan of care, prognosis and all questions were addressed.    We spoke about [name] condition, heart and renal failure, pelvic fracture and advance age. Poor prognosis.   Nutrition Status: Nutrition Problem: Moderate Malnutrition Etiology: chronic illness (CHF) Signs/Symptoms: moderate fat depletion, severe muscle depletion Interventions: Ensure Enlive (each supplement provides 350kcal and 20 grams of protein), Other (Comment) (downgrade diet, feeding assist)    Subjective: Patient is constipated today, no nausea or vomiting, no chest pain.  Continue to have significant dyspnea with minimal exertion.   Objective: Vitals:   02/21/21 1934 02/22/21 0410 02/22/21 0749 02/22/21 0929  BP: 121/77 (!) 99/46 (!) 99/54 98/74  Pulse: (!) 105 79 (!) 101   Resp: 20 16    Temp: 98.7 F (37.1 C) 98.3 F (36.8 C) 98.7 F (37.1 C)   TempSrc: Oral Oral Oral   SpO2: 100% 94% 98%   Weight:  74.5 kg    Height:        Intake/Output Summary (Last 24 hours) at 02/22/2021 1107 Last data filed at 02/22/2021 1055 Gross per 24 hour  Intake 717 ml  Output 3275 ml  Net -2558 ml   Filed Weights   02/20/21 0445 02/21/21 0018 02/22/21 0410  Weight: 77.7 kg 76.4 kg 74.5 kg    Examination:   General: positive resting dyspnea, deconditioned  Neurology: Awake and alert, non focal  E ENT: mild pallor, no icterus, oral mucosa moist Cardiovascular: No JVD. S1-S2 present, rhythmic, no gallops, rubs,  or murmurs.  +++ pitting hips and abdomen. Positive unna boots at the legs. Scrotal edema.  Pulmonary: positive breath sounds bilaterally, with no wheezing, rhonchi, positive rales. Gastrointestinal. Abdomen soft and non tender Skin. No rashes Musculoskeletal: no joint deformities   Data Reviewed: I have personally reviewed following labs and imaging studies  CBC: Recent Labs  Lab 02/16/21 0227 02/16/21 1304 02/17/21 0407 02/17/21 1244 02/18/21 0144 02/19/21 0433 02/20/21 0302  WBC 11.3*  --  9.6  --  12.7* 9.3  --   HGB 6.9*   < > 7.9* 8.7* 8.2* 8.6* 8.9*  HCT 20.9*   < > 23.4* 25.8* 25.0* 26.0* 26.9*  MCV 90.5  --  91.8  --  92.6 93.9  --   PLT 137*  --  141*  --  157 160  --    < > = values in this interval not displayed.   Basic Metabolic Panel: Recent Labs  Lab 02/18/21 0144 02/19/21 0433 02/20/21 0302 02/21/21 0309 02/22/21 0335  NA 128* 128* 129* 130* 133*  K 3.6 3.1* 3.4* 3.2* 3.6  CL 89* 89* 90* 91* 91*  CO2 29 24 28  32 31  GLUCOSE 117* 87 92 123* 108*  BUN 64* 62* 64* 66* 72*  CREATININE  2.24* 2.07* 2.02* 1.98* 2.03*  CALCIUM 8.2* 8.0* 8.2* 8.2* 8.4*  MG  --   --   --  2.2 2.1   GFR: Estimated Creatinine Clearance: 18.3 mL/min (A) (by C-G formula based on SCr of 2.03 mg/dL (H)). Liver Function Tests: No results for input(s): AST, ALT, ALKPHOS, BILITOT, PROT, ALBUMIN in the last 168 hours. No results for input(s): LIPASE, AMYLASE in the last 168 hours. No results for input(s): AMMONIA in the last 168 hours. Coagulation Profile: Recent Labs  Lab 02/16/21 0227 02/17/21 0407  INR 1.7* 1.5*   Cardiac Enzymes: No results for input(s): CKTOTAL, CKMB, CKMBINDEX, TROPONINI in the last 168 hours. BNP (last 3 results) No results for input(s): PROBNP in the last 8760 hours. HbA1C: No results for input(s): HGBA1C in the last 72 hours. CBG: No results for input(s): GLUCAP in the last 168 hours. Lipid Profile: No results for input(s): CHOL, HDL, LDLCALC,  TRIG, CHOLHDL, LDLDIRECT in the last 72 hours. Thyroid Function Tests: No results for input(s): TSH, T4TOTAL, FREET4, T3FREE, THYROIDAB in the last 72 hours. Anemia Panel: No results for input(s): VITAMINB12, FOLATE, FERRITIN, TIBC, IRON, RETICCTPCT in the last 72 hours.    Radiology Studies: I have reviewed all of the imaging during this hospital visit personally     Scheduled Meds:  bisacodyl  10 mg Rectal Once   clotrimazole  1 application Topical BID   docusate sodium  100 mg Oral BID   feeding supplement  237 mL Oral TID BM   fluticasone  2 spray Each Nare Daily   furosemide  80 mg Intravenous Q12H   levothyroxine  88 mcg Oral QAC breakfast   metoprolol succinate  25 mg Oral Daily   pantoprazole  40 mg Oral Daily   simvastatin  10 mg Oral QHS   Continuous Infusions:   LOS: 9 days        Heath Tesler Gerome Apley, MD

## 2021-02-23 LAB — BASIC METABOLIC PANEL
Anion gap: 6 (ref 5–15)
BUN: 73 mg/dL — ABNORMAL HIGH (ref 8–23)
CO2: 38 mmol/L — ABNORMAL HIGH (ref 22–32)
Calcium: 8.3 mg/dL — ABNORMAL LOW (ref 8.9–10.3)
Chloride: 87 mmol/L — ABNORMAL LOW (ref 98–111)
Creatinine, Ser: 2.13 mg/dL — ABNORMAL HIGH (ref 0.61–1.24)
GFR, Estimated: 27 mL/min — ABNORMAL LOW (ref >60)
Glucose, Bld: 107 mg/dL — ABNORMAL HIGH (ref 70–99)
Potassium: 3.1 mmol/L — ABNORMAL LOW (ref 3.5–5.1)
Sodium: 131 mmol/L — ABNORMAL LOW (ref 135–145)

## 2021-02-23 MED ORDER — POTASSIUM CHLORIDE CRYS ER 20 MEQ PO TBCR
40.0000 meq | EXTENDED_RELEASE_TABLET | Freq: Once | ORAL | Status: AC
  Administered 2021-02-23: 40 meq via ORAL
  Filled 2021-02-23: qty 2

## 2021-02-23 MED ORDER — FUROSEMIDE 10 MG/ML IJ SOLN
80.0000 mg | Freq: Once | INTRAMUSCULAR | Status: AC
  Administered 2021-02-23: 80 mg via INTRAVENOUS
  Filled 2021-02-23: qty 8

## 2021-02-23 NOTE — Progress Notes (Signed)
PROGRESS NOTE    [name]  [name] DOB: May 06, 1920 DOA: 02/12/2021 PCP: [name]    Brief Narrative:  [name] was admitted to the hospital with the working diagnosis of pelvic fracture, complicated with acute blood loss anemia (hematoma) in the setting of decompensated heart failure.    87 year old male past medical history for hypothyroidism, chronic anxiety/depression, hypertension, atrial fibrillation, heart failure and ambulatory dysfunction who presented after a mechanical fall.  He fell backwards after standing from his chair.  Reported frequent falls over the last 3 months.  On his initial physical examination his temperature was 98.2, blood pressure 101/56, heart rate 97, respiratory rate 21, oxygen saturation 100% on room air.  His lungs were clear to auscultation, no wheezing or rales, heart S1-S2, present, rhythmic, soft abdomen, no lower extremity edema.   Sodium 126, potassium 4.2, chloride 90, bicarb 27, glucose 126, BUN 48, creatinine 2.10, white count 11.3, hemoglobin 10.9, hematocrit 34.2, platelets 188. SARS COVID-19 negative.   Head CT negative for acute changes. Cervical CT no acute fracture. (Acute/subacute mild compression fractures T1-T2 superior endplate)   Pelvic CT with acute displaced fractures involving the left superior and inferior pubic rami.   Chest radiograph my cardiomegaly, bilateral peripheral reticular markings.69   EKG 111 bpm, left axis deviation, right bundle branch block, atrial fibrillation, q wave lead II, lead III, aVF, V1-V3, poor R wave progression, no ST segment or T wave changes.   Patient developed hypotension and worsening anemia.  Further work-up with CT abdomen/pelvis showed a large right hip hematoma. Received packed red blood cell transfusions and vitamin K to reverse effects of warfarin.   Total of 3 units packed red blood cells transfused.   Worsening edema consistent with decompensated heart failure,  placed on intravenous furosemide.   Assessment & Plan:   Principal Problem:   Closed fracture of multiple pubic rami [name]) Active Problems:   Fall   Pelvic fracture ([name])   Acute blood loss anemia   Traumatic hematoma of right hip   AKI (acute kidney injury) ([name])   CKD (chronic kidney disease), stage III ([name])   Atrial fibrillation, chronic ([name])   Malnutrition of moderate degree    Acute left pelvic fracture (traumatic), complicated with intra-abdominal hematoma, acute blood loss anemia. SP 3 units PRBC transfusion. SP vitamin K. Continue with acetaminophen and oxycodone for pain control.   Repeat radiographs in 3 weeks as outpatient.  Will follow H&H in am.     2. Acute decompensation of systolic heart failure/ hypotension. Pulmonary hypertension class 2 (acute on chronic core pulmonale)  Echocardiogram with EF 40 to 45%, with global hypokinesis, mild reduction on RV systolic function, elevated pulmonary hypertension RSVP 54.7 mmHg  Urine output is 2,400 over last 24 hrs. His scrotal peripheral edema including scrotum is improving, but continue to be volume overloaded. Positive JVD Portable ultrasound was used to evaluate: Dyspnea/hypoxia and Ejection fraction  Lungs with less B lines, noted mild reduction in LV systolic function, positive LVH.   Had metolazone on 07/15 His blood pressure has improved today with systolic 017 mmHg. Will repeat dose of furosemide 80 mg IV x1 today.   Patient with poor prognosis with heart failure, renal failure, and pelvic fracture (ambulatory dysfunction). Will offer family palliative care services at discharge from the hospital to follow up as outpatient.,    3. AKI on CKD stage 3b, hyponatremia/ hypokalemia.  Renal function with serum cr at 2,13 with K at 3,1 and serum  bicarbonate at 38. Will add 40 meq Kcl today and continue diuresis with furosemide.  Avoid hypotension and nephrotoxic medications. Edema with improvement today.     4. Chronic atrial fibrillation. Continue rate control with metoprolol, no anticoagulation due to hematoma.    5. Hypothyroid. Continue with Levothyroxine   6. Dyslipidemia. Continue with atorvastatin.    7. Moderate calorie protein malnutrition. Nutritional supplements Patient very weak and deconditioned, sustained a injury yesterday to his right elbow, this am with no impairment of movement of local edema, will continue to monitor. Hold on imaging for now.   Patient continue to be at high risk for worsening heart and renal failure   Status is: Inpatient  Remains inpatient appropriate because:IV treatments appropriate due to intensity of illness or inability to take PO  Dispo:  Patient From: [name]  Planned Disposition: [name]  Medically stable for discharge: No     DVT prophylaxis: Scd   Code Status:   Dnr   Family Communication:  No family at the bedside      Nutrition Status: Nutrition Problem: Moderate Malnutrition Etiology: chronic illness (CHF) Signs/Symptoms: moderate fat depletion, severe muscle depletion Interventions: Ensure Enlive (each supplement provides 350kcal and 20 grams of protein), Other (Comment) (downgrade diet, feeding assist)    Subjective: Patient continue to be very weak and deconditioned, his blood pressure has improved today. Continue to have dyspnea but edema lower extremities and scrotum has improved.   Objective: Vitals:   02/22/21 0929 02/22/21 1446 02/22/21 1955 02/23/21 0312  BP: 98/74 (!) 103/46 115/64 105/72  Pulse:   97 95  Resp:   18 16  Temp:   98.4 F (36.9 C) 98.2 F (36.8 C)  TempSrc:   Oral Oral  SpO2:   98% 94%  Weight:    71.6 kg  Height:        Intake/Output Summary (Last 24 hours) at 02/23/2021 1132 Last data filed at 02/23/2021 0313 Gross per 24 hour  Intake 240 ml  Output 1600 ml  Net -1360 ml   Filed Weights   02/21/21 0018 02/22/21 0410 02/23/21 0312  Weight: 76.4 kg  74.5 kg 71.6 kg    Examination:   General: deconditioned and ill looking appearing  Neurology: Awake and alert, non focal  E ENT: no pallor, no icterus, oral mucosa moist Cardiovascular: No JVD. S1-S2 present, rhythmic, no gallops, rubs, or murmurs. ++/+++ hips and abdomen, unna boots at his lower extremity edema. Pulmonary: positive breath sounds bilaterally, no wheezing, scattered rhonchi and rales. Gastrointestinal. Abdomen soft and non tender Skin. No rashes Musculoskeletal: no joint deformities     Data Reviewed: I have personally reviewed following labs and imaging studies  CBC: Recent Labs  Lab 02/17/21 0407 02/17/21 1244 02/18/21 0144 02/19/21 0433 02/20/21 0302  WBC 9.6  --  12.7* 9.3  --   HGB 7.9* 8.7* 8.2* 8.6* 8.9*  HCT 23.4* 25.8* 25.0* 26.0* 26.9*  MCV 91.8  --  92.6 93.9  --   PLT 141*  --  157 160  --    Basic Metabolic Panel: Recent Labs  Lab 02/19/21 0433 02/20/21 0302 02/21/21 0309 02/22/21 0335 02/23/21 0203  NA 128* 129* 130* 133* 131*  K 3.1* 3.4* 3.2* 3.6 3.1*  CL 89* 90* 91* 91* 87*  CO2 24 28 32 31 38*  GLUCOSE 87 92 123* 108* 107*  BUN 62* 64* 66* 72* 73*  CREATININE 2.07* 2.02* 1.98* 2.03* 2.13*  CALCIUM 8.0* 8.2* 8.2*  8.4* 8.3*  MG  --   --  2.2 2.1  --    GFR: Estimated Creatinine Clearance: 17.5 mL/min (A) (by C-G formula based on SCr of 2.13 mg/dL (H)). Liver Function Tests: No results for input(s): AST, ALT, ALKPHOS, BILITOT, PROT, ALBUMIN in the last 168 hours. No results for input(s): LIPASE, AMYLASE in the last 168 hours. No results for input(s): AMMONIA in the last 168 hours. Coagulation Profile: Recent Labs  Lab 02/17/21 0407  INR 1.5*   Cardiac Enzymes: No results for input(s): CKTOTAL, CKMB, CKMBINDEX, TROPONINI in the last 168 hours. BNP (last 3 results) No results for input(s): PROBNP in the last 8760 hours. HbA1C: No results for input(s): HGBA1C in the last 72 hours. CBG: No results for input(s): GLUCAP  in the last 168 hours. Lipid Profile: No results for input(s): CHOL, HDL, LDLCALC, TRIG, CHOLHDL, LDLDIRECT in the last 72 hours. Thyroid Function Tests: No results for input(s): TSH, T4TOTAL, FREET4, T3FREE, THYROIDAB in the last 72 hours. Anemia Panel: No results for input(s): VITAMINB12, FOLATE, FERRITIN, TIBC, IRON, RETICCTPCT in the last 72 hours.    Radiology Studies: I have reviewed all of the imaging during this hospital visit personally     Scheduled Meds:  clotrimazole  1 application Topical BID   docusate sodium  100 mg Oral BID   feeding supplement  237 mL Oral TID BM   fluticasone  2 spray Each Nare Daily   levothyroxine  88 mcg Oral QAC breakfast   metoprolol succinate  25 mg Oral Daily   pantoprazole  40 mg Oral Daily   simvastatin  10 mg Oral QHS   Continuous Infusions:   LOS: 10 days        Tam Savoia Gerome Apley, MD

## 2021-02-24 LAB — BASIC METABOLIC PANEL
Anion gap: 9 (ref 5–15)
BUN: 76 mg/dL — ABNORMAL HIGH (ref 8–23)
CO2: 37 mmol/L — ABNORMAL HIGH (ref 22–32)
Calcium: 8.4 mg/dL — ABNORMAL LOW (ref 8.9–10.3)
Chloride: 89 mmol/L — ABNORMAL LOW (ref 98–111)
Creatinine, Ser: 2.28 mg/dL — ABNORMAL HIGH (ref 0.61–1.24)
GFR, Estimated: 25 mL/min — ABNORMAL LOW (ref >60)
Glucose, Bld: 98 mg/dL (ref 70–99)
Potassium: 2.6 mmol/L — CL (ref 3.5–5.1)
Sodium: 135 mmol/L (ref 135–145)

## 2021-02-24 LAB — HEMOGLOBIN AND HEMATOCRIT, BLOOD
HCT: 28 % — ABNORMAL LOW (ref 39.0–52.0)
Hemoglobin: 8.8 g/dL — ABNORMAL LOW (ref 13.0–17.0)

## 2021-02-24 LAB — MAGNESIUM: Magnesium: 2.1 mg/dL (ref 1.7–2.4)

## 2021-02-24 MED ORDER — POTASSIUM CHLORIDE 10 MEQ/100ML IV SOLN
10.0000 meq | INTRAVENOUS | Status: DC
  Administered 2021-02-24 (×4): 10 meq via INTRAVENOUS
  Filled 2021-02-24 (×5): qty 100

## 2021-02-24 MED ORDER — POTASSIUM CHLORIDE CRYS ER 20 MEQ PO TBCR
30.0000 meq | EXTENDED_RELEASE_TABLET | ORAL | Status: AC
  Administered 2021-02-24 (×2): 30 meq via ORAL
  Filled 2021-02-24 (×2): qty 1

## 2021-02-24 MED ORDER — FUROSEMIDE 10 MG/ML IJ SOLN
80.0000 mg | Freq: Once | INTRAMUSCULAR | Status: AC
  Administered 2021-02-24: 80 mg via INTRAVENOUS
  Filled 2021-02-24: qty 8

## 2021-02-24 NOTE — TOC Progression Note (Signed)
Transition of Care [facility]) - Progression Note    Patient Details  Name: [name] MRN: [id number] Date of Birth: [birth year]  Transition of Care [facility]) CM/SW [place], [place] Phone Number: 02/24/2021, 12:22 PM  Clinical Narrative:    1222: CSW attempted to contact Ivy at [facility] about pt DC, no answer CSW will follow up with facility. CSW contacted pt daughter to confirm pt DC to ID[facility] vs SNF. Pt daughter was informed the pt is to go to SNF but wants confirmation bc MD spoke with her and suggested palliative to follow. CSW will follow up with pt daughter after Karlene Einstein from [facility] responds.   Expected Discharge Plan: [place] Barriers to Discharge: Continued Medical Work up  Expected Discharge Plan and Services Expected Discharge Plan: [place] In-house Referral: Clinical Social Work Discharge Planning Services: NA Post Acute Care Choice: [place] Living arrangements for the past 2 months: [facility]                                       Social Determinants of Health (SDOH) Interventions    Readmission Risk Interventions No flowsheet data found.

## 2021-02-24 NOTE — Progress Notes (Signed)
Manufacturing engineer [facility]) Hospital Liaison: RN note     Notified by Transition of Care Manger of patient/family request for [facility] after discharge. Chart and patient information under review by [facility] physician. Hospice eligibility pending currently.     Writer left a voicemail for pt's daughter [name] at [phone]. Writer will follow up later today.  Please do not hesitate to call with questions.   Thank you for the opportunity to participate in this patient's care.  Domenic Moras, BSN, RN       [place] (listed on AMION under [id number]  [phone] (24h on call)

## 2021-02-24 NOTE — Progress Notes (Signed)
PROGRESS NOTE    Elih Mooney  FFM:[id number] DOB: 02-24-1920 DOA: 02/12/2021 PCP: [name], [name], NP    Brief Narrative:  Mr. Narang was admitted to the hospital with the working diagnosis of pelvic fracture, complicated with acute blood loss anemia (hematoma) in the setting of decompensated heart failure.    87 year old male past medical history for hypothyroidism, chronic anxiety/depression, hypertension, atrial fibrillation, heart failure and ambulatory dysfunction who presented after a mechanical fall.  He fell backwards after standing from his chair.  Reported frequent falls over the last 3 months.  On his initial physical examination his temperature was 98.2, blood pressure 101/56, heart rate 97, respiratory rate 21, oxygen saturation 100% on room air.  His lungs were clear to auscultation, no wheezing or rales, heart S1-S2, present, rhythmic, soft abdomen, no lower extremity edema.   Sodium 126, potassium 4.2, chloride 90, bicarb 27, glucose 126, BUN 48, creatinine 2.10, white count 11.3, hemoglobin 10.9, hematocrit 34.2, platelets 188. SARS COVID-19 negative.   Head CT negative for acute changes. Cervical CT no acute fracture. (Acute/subacute mild compression fractures T1-T2 superior endplate)   Pelvic CT with acute displaced fractures involving the left superior and inferior pubic rami.   Chest radiograph my cardiomegaly, bilateral peripheral reticular markings.69   EKG 111 bpm, left axis deviation, right bundle branch block, atrial fibrillation, q wave lead II, lead III, aVF, V1-V3, poor R wave progression, no ST segment or T wave changes.   Patient developed hypotension and worsening anemia.  Further work-up with CT abdomen/pelvis showed a large right hip hematoma. Received packed red blood cell transfusions and vitamin K to reverse effects of warfarin.   Total of 3 units packed red blood cells transfused.   Worsening edema consistent with decompensated heart failure,  placed on intravenous furosemide.   Patient with poor prognosis with heart failure, renal failure, and pelvic fracture (ambulatory dysfunction). Will offer family palliative care services at discharge from the hospital to follow up as outpatient.  Consult palliative care inpatient.   Assessment & Plan:   Principal Problem:   Closed fracture of multiple pubic rami [facility]) Active Problems:   Fall   Pelvic fracture (HCC)   Acute blood loss anemia   Traumatic hematoma of right hip   AKI (acute kidney injury) ([place])   CKD (chronic kidney disease), stage III (HCC)   Atrial fibrillation, chronic (HCC)   Malnutrition of moderate degree     Acute left pelvic fracture (traumatic), complicated with intra-abdominal hematoma, acute blood loss anemia. SP 3 units PRBC transfusion. SP vitamin K. Continue with acetaminophen and oxycodone for pain control.   Repeat radiographs in 3 weeks as outpatient.  Plan to check H&H today, he has a large area of ecchymosis on his right lateral thigh.     2. Acute decompensation of systolic heart failure/ hypotension. Pulmonary hypertension class 2 (acute on chronic core pulmonale)  Echocardiogram with EF 40 to 45%, with global hypokinesis, mild reduction on RV systolic function, elevated pulmonary hypertension RSVP 54.7 mmHg   Urine output is 2,350 over last 24 hrs. Scrotal peripheral edema including scrotum is improving, but continue to be volume overloaded. Positive JVD  Had metolazone on 07/15 Will resume furosemide 80 mg IV x1 today, blood pressure systolic has been about 570 mmHg.    Poor prognosis will call palliative care consultation.    3. AKI on CKD stage 3b, hyponatremia/ hypokalemia.  Serum cr today at 2, 28 with K at 2,6 and serum bicarbonate at 37,  NA 135.   Continue congested and volume overloaded, will continue diuresis with IV furosemide as tolerated per blood pressure.  Continue to avoid hypotension and nephrotoxic medications.  Will  change to po Kcl from IV. He had about 20 IV and will add 60 po for a total of 80 meq today of KCL. Follow up renal function in am.    4. Chronic atrial fibrillation. On metoprolol for rate control. Holding anticoagulation due to large hematoma and fall risk, patient very debilitated.   5. Hypothyroid. On Levothyroxine   6. Dyslipidemia. On atorvastatin.    7. Moderate calorie protein malnutrition.  Patient very weak and deconditioned, sustained a injury 07/16 at his right elbow, no clinical sings of bone or joint lesion. Continue with physical therapy as tolerated.     Patient continue to be at high risk for worsening renal and heart failure.   Status is: Inpatient  Remains inpatient appropriate because:IV treatments appropriate due to intensity of illness or inability to take PO  Dispo:  Patient From: [place]  Planned Disposition: [place]  Medically stable for discharge: No     DVT prophylaxis: Scd   Code Status:   Dnr   Family Communication:  No family at the bedside      Nutrition Status: Nutrition Problem: Moderate Malnutrition Etiology: chronic illness (CHF) Signs/Symptoms: moderate fat depletion, severe muscle depletion Interventions: Ensure Enlive (each supplement provides 350kcal and 20 grams of protein), Other (Comment) (downgrade diet, feeding assist)    Consultants:  Orthopedics   Subjective: Patient continue to be very weak and deconditioned, this am complains of IV burning from IV Kcl, no nausea or vomiting. At rest with no worsening dyspnea, continue to have edema lower extremities,   Objective: Vitals:   02/23/21 0312 02/23/21 1608 02/23/21 1925 02/24/21 0416  BP: 105/72 105/64 101/66 (!) 101/58  Pulse: 95 (!) 109 86 83  Resp: 16 18 16 16   Temp: 98.2 F (36.8 C)  98.3 F (36.8 C) 97.7 F (36.5 C)  TempSrc: Oral Oral Oral Oral  SpO2: 94% 92% 100% 99%  Weight: 71.6 kg   69.3 kg  Height:        Intake/Output  Summary (Last 24 hours) at 02/24/2021 1012 Last data filed at 02/24/2021 0835 Gross per 24 hour  Intake 696.8 ml  Output 2350 ml  Net -1653.2 ml   Filed Weights   02/22/21 0410 02/23/21 0312 02/24/21 0416  Weight: 74.5 kg 71.6 kg 69.3 kg    Examination:   General: deconditioned and ill looking appearing  Neurology: Awake and alert, non focal  E ENT: mild pallor, no icterus, oral mucosa moist Cardiovascular: No JVD. S1-S2 present, rhythmic, no gallops, rubs, or murmurs. ++/+++ pitting bilateral lower extremity edema, unna boots at the legs, positive pitting edema thighs, improve genital edema  Pulmonary: vesicular breath sounds bilaterally, adequate air movement, no wheezing, rhonchi or rales. Gastrointestinal. Abdomen soft and non tender Skin. Large ecchymosis on the right lateral thigh  Musculoskeletal: no joint deformities     Data Reviewed: I have personally reviewed following labs and imaging studies  CBC: Recent Labs  Lab 02/17/21 1244 02/18/21 0144 02/19/21 0433 02/20/21 0302  WBC  --  12.7* 9.3  --   HGB 8.7* 8.2* 8.6* 8.9*  HCT 25.8* 25.0* 26.0* 26.9*  MCV  --  92.6 93.9  --   PLT  --  157 160  --    Basic Metabolic Panel: Recent Labs  Lab 02/20/21  0302 02/21/21 0309 02/22/21 0335 02/23/21 0203 02/24/21 0252  NA 129* 130* 133* 131* 135  K 3.4* 3.2* 3.6 3.1* 2.6*  CL 90* 91* 91* 87* 89*  CO2 28 32 31 38* 37*  GLUCOSE 92 123* 108* 107* 98  BUN 64* 66* 72* 73* 76*  CREATININE 2.02* 1.98* 2.03* 2.13* 2.28*  CALCIUM 8.2* 8.2* 8.4* 8.3* 8.4*  MG  --  2.2 2.1  --  2.1   GFR: Estimated Creatinine Clearance: 16.3 mL/min (A) (by C-G formula based on SCr of 2.28 mg/dL (H)). Liver Function Tests: No results for input(s): AST, ALT, ALKPHOS, BILITOT, PROT, ALBUMIN in the last 168 hours. No results for input(s): LIPASE, AMYLASE in the last 168 hours. No results for input(s): AMMONIA in the last 168 hours. Coagulation Profile: No results for input(s): INR,  PROTIME in the last 168 hours. Cardiac Enzymes: No results for input(s): CKTOTAL, CKMB, CKMBINDEX, TROPONINI in the last 168 hours. BNP (last 3 results) No results for input(s): PROBNP in the last 8760 hours. HbA1C: No results for input(s): HGBA1C in the last 72 hours. CBG: No results for input(s): GLUCAP in the last 168 hours. Lipid Profile: No results for input(s): CHOL, HDL, LDLCALC, TRIG, CHOLHDL, LDLDIRECT in the last 72 hours. Thyroid Function Tests: No results for input(s): TSH, T4TOTAL, FREET4, T3FREE, THYROIDAB in the last 72 hours. Anemia Panel: No results for input(s): VITAMINB12, FOLATE, FERRITIN, TIBC, IRON, RETICCTPCT in the last 72 hours.    Radiology Studies: I have reviewed all of the imaging during this hospital visit personally     Scheduled Meds:  clotrimazole  1 application Topical BID   docusate sodium  100 mg Oral BID   feeding supplement  237 mL Oral TID BM   fluticasone  2 spray Each Nare Daily   levothyroxine  88 mcg Oral QAC breakfast   metoprolol succinate  25 mg Oral Daily   pantoprazole  40 mg Oral Daily   simvastatin  10 mg Oral QHS   Continuous Infusions:  potassium chloride 10 mEq (02/24/21 0908)     LOS: 11 days        Alexzandria Massman Gerome Apley, MD

## 2021-02-24 NOTE — Progress Notes (Signed)
   02/24/21 0900  Therapy Vitals  Temp 97.9 F (36.6 C)  Temp Source Oral  Pulse Rate 92  Resp 18  BP 111/72  Patient Position (if appropriate) Sitting  Oxygen Therapy  SpO2 97 %  O2 Device Nasal Cannula  Mobility  Activity Transferred:  Bed to chair;Sat and stood x 3  Range of Motion/Exercises Active;All extremities  Level of Assistance Maximum assist, patient does 25-49% (Min A supine>sit (HHA + cues to use bedrail; Mod A sit>stand; Max A squat pivot)  Assistive Device Other (Comment) (Arms Held)  RLE Weight Bearing WBAT  Minutes Ambulated 2 minutes  Distance Ambulated (ft) 0 ft  Mobility Out of bed to chair with meals  Mobility Response Tolerated well  Mobility performed by Mobility specialist  Bed Position Chair  $Mobility charge 1 Mobility

## 2021-02-24 NOTE — Progress Notes (Signed)
Patient has adequate IV access at this time. RN will reorder if needed

## 2021-02-24 NOTE — Progress Notes (Signed)
K 2.6 this am, MD notified and ordered replacement with K IV, will continue to monitor, Thanks Arvella Nigh RN.

## 2021-02-24 NOTE — NC FL2 (Signed)
[place] LEVEL OF CARE SCREENING TOOL     IDENTIFICATION  Patient Name: [name] Birthdate: 01-01-1920 Sex: male Admission Date (Current Location): 02/12/2021  [facility] and [place] Number:  Herbalist and Address:  [facility] [place] [address] [place], [place] [address]      Provider Number: [id number]  Attending Physician Name and Address:  Tawni Millers,*  Relative Name and Phone Number:  Crecencio Mc (Daughter)   [phone]    Current Level of Care: Hospital Recommended Level of Care: Nursing Facility Prior Approval Number:    Date Approved/Denied:   PASRR Number:    Discharge Plan: Other (Comment) (Nursing Facility)    Current Diagnoses: Patient Active Problem List   Diagnosis Date Noted   Malnutrition of moderate degree 02/20/2021   Acute blood loss anemia 02/14/2021   Traumatic hematoma of right hip 02/14/2021   AKI (acute kidney injury) ([place]) 02/14/2021   CKD (chronic kidney disease), stage III ([place]) 02/14/2021   Atrial fibrillation, chronic ([place]) 02/14/2021   Pelvic fracture ([place]) 02/13/2021   Closed fracture of multiple pubic rami ([place]) 02/13/2021   Fall 02/12/2021    Orientation RESPIRATION BLADDER Height & Weight     Self, Time, Situation, Place  O2 (2L Nasal Cannula) Continent, External catheter Weight: 152 lb 12.5 oz (69.3 kg) Height:  5\' 6"  (167.6 cm)  BEHAVIORAL SYMPTOMS/MOOD NEUROLOGICAL BOWEL NUTRITION STATUS      Continent Diet (See DC Summary)  AMBULATORY STATUS COMMUNICATION OF NEEDS Skin   Extensive Assist Verbally Normal                       Personal Care Assistance Level of Assistance  Bathing, Feeding, Dressing Bathing Assistance: Maximum assistance Feeding assistance: Independent Dressing Assistance: Maximum assistance     Functional Limitations Info  Sight, Hearing, Speech Sight Info: Impaired Hearing Info: Adequate Speech Info: Adequate    SPECIAL CARE  FACTORS FREQUENCY   (Hospice)     PT Frequency: N/A OT Frequency: N/A            Contractures Contractures Info: Not present    Additional Factors Info  Code Status, Allergies Code Status Info: DNR Allergies Info: Ambien (zolpidem), Penicillins           Current Medications (02/24/2021):  This is the current hospital active medication list Current Facility-Administered Medications  Medication Dose Route Frequency Provider Last Rate Last Admin   acetaminophen (TYLENOL) tablet 650 mg  650 mg Oral Q6H PRN Lavina Hamman, MD   650 mg at 02/23/21 2226   clotrimazole (LOTRIMIN) 1 % cream 1 application  1 application Topical BID Lavina Hamman, MD   1 application at 54/62/70 0908   docusate sodium (COLACE) capsule 100 mg  100 mg Oral BID Lavina Hamman, MD   100 mg at 02/24/21 0959   feeding supplement (ENSURE ENLIVE / ENSURE PLUS) liquid 237 mL  237 mL Oral TID BM Arrien, Jimmy Picket, MD   237 mL at 02/21/21 1343   fluticasone (FLONASE) 50 MCG/ACT nasal spray 2 spray  2 spray Each Nare Daily Lavina Hamman, MD   2 spray at 02/24/21 0958   levothyroxine (SYNTHROID) tablet 88 mcg  88 mcg Oral QAC breakfast Kayleen Memos, DO   88 mcg at 02/24/21 3500   metoprolol succinate (TOPROL-XL) 24 hr tablet 25 mg  25 mg Oral Daily Mariel Aloe, MD   25 mg at 02/24/21  0959   ondansetron (ZOFRAN) injection 4 mg  4 mg Intravenous Q6H PRN Lavina Hamman, MD       oxyCODONE (Oxy IR/ROXICODONE) immediate release tablet 5 mg  5 mg Oral Q4H PRN Lavina Hamman, MD   5 mg at 02/13/21 2113   pantoprazole (PROTONIX) EC tablet 40 mg  40 mg Oral Daily Irene Pap N, DO   40 mg at 02/24/21 3557   simvastatin (ZOCOR) tablet 10 mg  10 mg Oral QHS Tawni Millers, MD   10 mg at 02/23/21 2045     Discharge Medications: Please see discharge summary for a list of discharge medications.  Relevant Imaging Results:  Relevant Lab Results:   Additional Information SSN: [id number]  Reece Agar, LCSWA

## 2021-02-24 NOTE — Progress Notes (Signed)
I spoke with patient's daughter and explained patient's condition. Poor prognosis in the setting of heart failure, renal failure, pelvic fracture and advance age.  Plan to transfer patient back to assisted living facility with his wife with hospice services. Consulted inpatient palliative care for further assistance.  All questions have been addressed.

## 2021-02-24 NOTE — Plan of Care (Signed)
  Problem: Pain Managment: Goal: General experience of comfort will improve Outcome: Completed/Met   Problem: Pain Managment: Goal: General experience of comfort will improve Outcome: Completed/Met   Problem: Elimination: Goal: Will not experience complications related to urinary retention Outcome: Completed/Met

## 2021-02-25 ENCOUNTER — Other Ambulatory Visit: Payer: Self-pay

## 2021-02-25 LAB — RESP PANEL BY RT-PCR (FLU A&B, COVID) ARPGX2
Influenza A by PCR: NEGATIVE
Influenza B by PCR: NEGATIVE
SARS Coronavirus 2 by RT PCR: NEGATIVE

## 2021-02-25 LAB — BASIC METABOLIC PANEL
Anion gap: 9 (ref 5–15)
BUN: 73 mg/dL — ABNORMAL HIGH (ref 8–23)
CO2: 38 mmol/L — ABNORMAL HIGH (ref 22–32)
Calcium: 8.5 mg/dL — ABNORMAL LOW (ref 8.9–10.3)
Chloride: 90 mmol/L — ABNORMAL LOW (ref 98–111)
Creatinine, Ser: 2.05 mg/dL — ABNORMAL HIGH (ref 0.61–1.24)
GFR, Estimated: 29 mL/min — ABNORMAL LOW (ref >60)
Glucose, Bld: 107 mg/dL — ABNORMAL HIGH (ref 70–99)
Potassium: 3.1 mmol/L — ABNORMAL LOW (ref 3.5–5.1)
Sodium: 137 mmol/L (ref 135–145)

## 2021-02-25 MED ORDER — ENSURE ENLIVE PO LIQD: 237.0000 mL | Freq: Three times a day (TID) | ORAL | 0 refills | Status: DC

## 2021-02-25 MED ORDER — OXYCODONE HCL 5 MG PO TABS: 5.0000 mg | ORAL_TABLET | ORAL | 0 refills | Status: DC | PRN

## 2021-02-25 MED ORDER — POTASSIUM CHLORIDE CRYS ER 20 MEQ PO TBCR
40.0000 meq | EXTENDED_RELEASE_TABLET | Freq: Once | ORAL | Status: AC
  Administered 2021-02-25: 40 meq via ORAL
  Filled 2021-02-25: qty 2

## 2021-02-25 MED ORDER — DIAZEPAM 5 MG PO TABS: 2.5000 mg | ORAL_TABLET | Freq: Four times a day (QID) | ORAL | 0 refills | Status: DC | PRN

## 2021-02-25 MED ORDER — TORSEMIDE 20 MG PO TABS
40.0000 mg | ORAL_TABLET | Freq: Every day | ORAL | Status: DC
  Administered 2021-02-25: 40 mg via ORAL
  Filled 2021-02-25: qty 2

## 2021-02-25 MED ORDER — POTASSIUM CHLORIDE CRYS ER 20 MEQ PO TBCR: 20.0000 meq | EXTENDED_RELEASE_TABLET | Freq: Every day | ORAL | 0 refills | Status: DC

## 2021-02-25 MED ORDER — POTASSIUM CHLORIDE CRYS ER 20 MEQ PO TBCR: 20.0000 meq | EXTENDED_RELEASE_TABLET | Freq: Every day | ORAL | Status: DC

## 2021-02-25 MED ORDER — POTASSIUM CHLORIDE CRYS ER 20 MEQ PO TBCR: 20.0000 meq | EXTENDED_RELEASE_TABLET | Freq: Every day | ORAL | 0 refills | Status: AC

## 2021-02-25 MED ORDER — TORSEMIDE 20 MG PO TABS: 20.0000 mg | ORAL_TABLET | Freq: Every day | ORAL | Status: DC

## 2021-02-25 NOTE — TOC Transition Note (Signed)
Transition of Care [facility]) - CM/SW Discharge Note   Patient Details  Name: [name] MRN: [id number] Date of Birth: 16-Apr-1920  [facility] CM/SW Contact:  Tresa Endo Phone Number: 02/25/2021, 2:41 PM   Clinical Narrative:    Patient will DC to: [facility] Anticipated DC date: 02/25/2021 Family notified: Pt daughter  Transport by: Corey Harold   Per MD patient ready for [facility] to [facility]. RN to call report prior to discharge [phone]). RN, patient, patient's family, and facility notified of DC. Discharge Summary and FL2 sent to facility. DC packet on chart. Ambulance transport requested for patient.   CSW will sign off for now as social work intervention is no longer needed. Please consult [place] again if new needs arise.     Final next level of care: Skilled Nursing Facility Barriers to Discharge: Continued Medical Work up   Patient Goals and CMS Choice Patient states their goals for this hospitalization and ongoing recovery are:: Return to SNF to [facility] rehab CMS Medicare.gov Compare Post Acute Care list provided to:: Patient Choice offered to / list presented to : Patient  Discharge Placement                       Discharge Plan and Services In-house Referral: Clinical Social Work Discharge Planning Services: NA Post Acute Care Choice: Skilled Nursing Facility                               Social Determinants of Health (SDOH) Interventions     Readmission Risk Interventions No flowsheet data found.

## 2021-02-25 NOTE — Discharge Summary (Addendum)
Physician Discharge Summary  [name] EVO:[id number] DOB: [birth year] DOA: 02/12/2021  PCP: [name], [name], NP  Admit date: 02/12/2021 Discharge date: 02/25/2021  Admitted From: SNF Disposition:  SNF  Recommendations for Outpatient Follow-up and new medication changes:  Follow up with Man [name] NP Continue torsemide for diuresis 40 mg daily, can increase to twice daily if worsening edema.  Follow renal panel in 3 days. Kcl 20 meq daily until follow up renal function.  Patient with poor prognosis, heart failure, renal failure, pelvic fracture and advance age, continue care under hospice services.    Home Health: na   Equipment/Devices: na    Discharge Condition: stable  CODE STATUS: DNR   Diet recommendation:  heart healthy   Brief/Interim Summary: [name] was admitted to the hospital with the working diagnosis of pelvic fracture, complicated with acute blood loss anemia (hip hematoma) in the setting of decompensated heart failure, cardiorenal syndrome.     87 year old male past medical history for hypothyroidism, chronic anxiety/depression, hypertension, atrial fibrillation, heart failure and ambulatory dysfunction who presented after a mechanical fall.  He fell backwards after standing from his chair.  Reported frequent falls over the last 3 months.  On his initial physical examination his temperature was 98.2, blood pressure 101/56, heart rate 97, respiratory rate 21, oxygen saturation 100% on room air.  His lungs had no wheezing but bilateral rales on auscultation, heart S1-S2, present, rhythmic, soft abdomen, positive pitting bilateral lower extremity edema.   Sodium 126, potassium 4.2, chloride 90, bicarb 27, glucose 126, BUN 48, creatinine 2.10, white count 11.3, hemoglobin 10.9, hematocrit 34.2, platelets 188. SARS COVID-19 negative.   Head CT negative for acute changes. Cervical CT no acute fracture. (Acute/subacute mild compression fractures T1-T2 superior endplate)    Pelvic CT with acute displaced fractures involving the left superior and inferior pubic rami.   Chest radiograph my cardiomegaly, bilateral peripheral reticular markings.69   EKG 111 bpm, left axis deviation, right bundle branch block, atrial fibrillation, q wave lead II, lead III, aVF, V1-V3, poor R wave progression, no ST segment or T wave changes.   Patient developed hypotension and worsening anemia.  Further work-up with CT abdomen/pelvis showed a large right hip hematoma. Received packed red blood cell transfusions and vitamin K to reverse effects of warfarin.   Total of 3 units packed red blood cells transfused.   Worsening edema consistent with decompensated heart failure, placed on intravenous furosemide.  After aggressive diuresis he developed hypotension and worsening renal failure with hypokalemia.  Moderate improvement in volume status.    Patient with poor prognosis with heart failure, renal failure, and pelvic fracture (ambulatory dysfunction). Offered him and  his family palliative care services.  Considering his poor prognosis he has found candidate for hospice services.   Acute left pelvic fracture (traumatic) complicated with left hip  hematoma, acute blood loss anemia.  Patient received 3 units packed red blood cells transfusion along with vitamin K to revert his warfarin related coagulopathy. Orthopedic surgery recommended conservative therapy and radiographic follow-up as an outpatient in 3 weeks.  Patient was seen by physical therapy and Occupational Therapy, recommended continue therapy as an outpatient. Large ecchymotic area of the right lateral thigh.  His discharge hemoglobin is 8.8, hematocrit 28.0.  2.  Acute decompensation of systolic heart failure, hypotension, pulmonary hypertension class II, acute on chronic cor pulmonale. Patient with severe volume overload, he received aggressive diuresis with loop diuretics and metolazone. At the time of his  discharge his fluid  balance is -9130 mL, continue to have edema at his thighs and rales at the bases of his lungs. Has unna boots bilaterally.  His oxygenation is 96 % on 2 L of supplemental oxygen per nasal cannula.   Further work-up with echocardiography showed left ventricle ejection fraction 40 to 45%, global hypokinesis, moderate reduction in RV systolic function, elevated pulmonary hypertension RSVP 54.7 mmHg.  Patient developed hypotension and renal failure that limited further aggressive diuresis. Considering pelvic fracture, lack of mobility, heart failure, renal failure and advanced age, he does have a poor prognosis.  I spoke with him and his family about the situation and they have decided to continue care under hospice services. I will recommend to continue diuresis with torsemide 20 mg daily and follow-up of electrolytes as an outpatient.  3.  Acute kidney injury on chronic kidney disease stage IIIb, hyponatremia, hypokalemia. He has significant volume overload, underwent aggressive diuresis with loop diuretics and metolazone. Hypotension has limited further aggressive diuresis. His volume status has improved but continued to have signs of hypervolemia.  At discharge sodium 137, potassium 3.1, chloride 90, bicarb 38, glucose 107, BUN 73, creatinine 2.05. Recommend continue torsemide 40 mg daily and follow-up renal panel in 3 days. Kcl 10 meq daily.   4.  Chronic atrial fibrillation.  Continue rate control metoprolol. Holding anticoagulation due to left hip hematoma and high fall risk.  5.  Hypothyroidism.  Continue levothyroxine.  6.  Dyslipidemia.  Continue atorvastatin.  7.  Moderate calorie protein malnutrition.  Patient was placed on nutritional supplements with good toleration.  Discharge Diagnoses:  Principal Problem:   Closed fracture of multiple pubic rami [facility]) Active Problems:   Fall   Pelvic fracture ([place])   Acute blood loss anemia   Traumatic hematoma of  right hip   AKI (acute kidney injury) ([place])   CKD (chronic kidney disease), stage III (HCC)   Atrial fibrillation, chronic (HCC)   Malnutrition of moderate degree    Discharge Instructions   Allergies as of 02/25/2021       Reactions   Ambien [zolpidem]    Penicillins         Medication List     STOP taking these medications    alum & mag hydroxide-simeth 200-200-20 MG/5ML suspension Commonly known as: MAALOX/MYLANTA   cephALEXin 500 MG capsule Commonly known as: KEFLEX   furosemide 40 MG tablet Commonly known as: LASIX   furosemide 80 MG tablet Commonly known as: LASIX   metolazone 2.5 MG tablet Commonly known as: ZAROXOLYN   sertraline 25 MG tablet Commonly known as: ZOLOFT   warfarin 1 MG tablet Commonly known as: COUMADIN   warfarin 3 MG tablet Commonly known as: COUMADIN       TAKE these medications    acetaminophen 325 MG tablet Commonly known as: TYLENOL Take 650 mg by mouth in the morning and at bedtime.   cholecalciferol 25 MCG (1000 UNIT) tablet Commonly known as: VITAMIN D Take 1,000 Units by mouth daily.   clotrimazole 1 % cream Commonly known as: LOTRIMIN Apply 1 application topically See admin instructions. Cleanse penis with soap and water, apply cream  twice daily  x 14 days   diazepam 5 MG tablet Commonly known as: VALIUM Take 0.5 tablets (2.5 mg total) by mouth every 6 (six) hours as needed for anxiety.   feeding supplement Liqd Take 237 mLs by mouth 3 (three) times daily between meals.   levothyroxine 88 MCG tablet Commonly known as: SYNTHROID Take 88  mcg by mouth daily before breakfast.   loratadine 10 MG tablet Commonly known as: CLARITIN Take 10 mg by mouth daily.   magnesium hydroxide 400 MG/5ML suspension Commonly known as: MILK OF MAGNESIA Take 30 mLs by mouth 3 (three) times daily as needed for mild constipation.   metoprolol succinate 25 MG 24 hr tablet Commonly known as: TOPROL-XL Take 25 mg by mouth  daily.   omeprazole 20 MG capsule Commonly known as: PRILOSEC Take 20 mg by mouth daily.   oxyCODONE 5 MG immediate release tablet Commonly known as: Oxy IR/ROXICODONE Take 1 tablet (5 mg total) by mouth every 4 (four) hours as needed for severe pain.   potassium chloride SA 20 MEQ tablet Commonly known as: KLOR-CON Take 1 tablet (20 mEq total) by mouth daily for 5 doses. Start taking on: February 26, 2021   simvastatin 10 MG tablet Commonly known as: ZOCOR Take 10 mg by mouth at bedtime.   torsemide 20 MG tablet Commonly known as: DEMADEX Take 40 mg by mouth daily.        Allergies  Allergen Reactions   Ambien [Zolpidem]    Penicillins     Consultations: Orthopedics    Procedures/Studies: CT ABDOMEN PELVIS WO CONTRAST  Result Date: 02/13/2021 CLINICAL DATA:  Anemia.  To assess for abdominal bleed. EXAM: CT ABDOMEN AND PELVIS WITHOUT CONTRAST TECHNIQUE: Multidetector CT imaging of the abdomen and pelvis was performed following the standard protocol without IV contrast. COMPARISON:  None. FINDINGS: Lower chest: Small bilateral pleural effusions. Coarse peripheral infiltrates with peribronchial thickening likely representing chronic interstitial lung disease. Postoperative changes in the mediastinum. Diffuse cardiac enlargement. Hepatobiliary: No focal liver abnormality is seen. No gallstones, gallbladder wall thickening, or biliary dilatation. Pancreas: Unremarkable. No pancreatic ductal dilatation or surrounding inflammatory changes. Spleen: Normal in size without focal abnormality. Adrenals/Urinary Tract: Adrenal glands are unremarkable. Kidneys are normal, without renal calculi, focal lesion, or hydronephrosis. Bladder is unremarkable. Stomach/Bowel: Stomach, small bowel, and colon are mostly decompressed with scattered stool in the colon. No wall thickening or inflammatory changes are appreciated. Appendix is not identified. Vascular/Lymphatic: Aortic atherosclerosis. No  enlarged abdominal or pelvic lymph nodes. Reproductive: Prostate is unremarkable. Other: No free air or free fluid in the abdomen. Abdominal wall musculature appears intact. There is diffuse edema throughout the subcutaneous soft tissues. In the subcutaneous fat lateral to the right hip, there is a heterogeneous collection with mixed density material measuring 7.1 x 10.1 by 15.5 cm. This is consistent with a soft tissue hematoma. Musculoskeletal: Degenerative changes in the spine and hips. No destructive bone lesions. Acute appearing displaced fractures of the left superior and inferior pubic rami and of the right anterior acetabular rim. IMPRESSION: 1. Large Mets density collection lateral to the right hip consistent with subcutaneous soft tissue hematoma. 2. Acute appearing fractures of the right anterior acetabulum and left superior and inferior pubic rami. 3. Diffuse soft tissue edema. 4. Coarse interstitial changes and bronchial thickening in the lung bases suggesting chronic interstitial lung disease. 5. Diffuse cardiac enlargement 6. Moderate aortic atherosclerosis. Electronically Signed   By: Lucienne Capers M.D.   On: 02/13/2021 20:43   DG Pelvis 1-2 Views  Result Date: 02/12/2021 CLINICAL DATA:  Trauma, fall EXAM: PELVIS - 1-2 VIEW COMPARISON:  12/31/2020 FINDINGS: SI joints are non widened. Both femoral heads project in joint. Acute displaced fractures involving the left superior and inferior pubic rami. IMPRESSION: Acute displaced fractures involving left superior and inferior pubic rami. Electronically Signed   By:  Donavan Foil M.D.   On: 02/12/2021 22:56   CT Head Wo Contrast  Result Date: 02/12/2021 CLINICAL DATA:  Lost balance leading to fall this evening. On anticoagulation. EXAM: CT HEAD WITHOUT CONTRAST TECHNIQUE: Contiguous axial images were obtained from the base of the skull through the vertex without intravenous contrast. COMPARISON:  Head CT 12/31/2020 FINDINGS: Brain: Stable degree  of atrophy and chronic small vessel ischemia. No intracranial hemorrhage, mass effect, or midline shift. No hydrocephalus. The basilar cisterns are patent. No evidence of territorial infarct or acute ischemia. No extra-axial or intracranial fluid collection. Vascular: Atherosclerosis of skullbase vasculature without hyperdense vessel or abnormal calcification. Skull: No fracture or focal lesion. Sinuses/Orbits: No acute findings.  Bilateral cataract resection. Other: Contracting right parietal scalp hematoma, diminished in size from prior. IMPRESSION: 1. No acute intracranial abnormality. No skull fracture. 2. Stable atrophy and chronic small vessel ischemia. Electronically Signed   By: Keith Rake M.D.   On: 02/12/2021 23:06   CT Cervical Spine Wo Contrast  Result Date: 02/12/2021 CLINICAL DATA:  Lost balance leading to fall seen knee. EXAM: CT CERVICAL SPINE WITHOUT CONTRAST TECHNIQUE: Multidetector CT imaging of the cervical spine was performed without intravenous contrast. Multiplanar CT image reconstructions were also generated. COMPARISON:  12/31/2020 FINDINGS: Alignment: Exaggerated cervical lordosis, unchanged from prior. No traumatic listhesis. Skull base and vertebrae: No acute cervical fracture. There is mild anterior wedging of T1 and T2 superior endplate, new from prior exam. Cervical vertebral body heights are maintained. The dens and skull base are intact. Soft tissues and spinal canal: No prevertebral fluid or swelling. No visible canal hematoma. Disc levels: Stable diffuse degenerative disc disease and multilevel facet hypertrophy. Upper chest: Nonacute. Other: Carotid calcifications IMPRESSION: 1. No acute cervical spine fracture or subluxation. 2. Mild anterior wedging of T1 and T2 superior endplate, new from 12/31/2020 exam. Findings are suspicious for acute or subacute mild compression fractures. 3. Stable multilevel degenerative disc disease and facet hypertrophy. Electronically Signed    By: Keith Rake M.D.   On: 02/12/2021 23:11   DG CHEST PORT 1 VIEW  Result Date: 02/17/2021 CLINICAL DATA:  Fall, rhonchi EXAM: PORTABLE CHEST 1 VIEW COMPARISON:  02/12/2021 FINDINGS: Unchanged low volume AP portable examination. Cardiomegaly status post median sternotomy. Bibasilar predominant coarse fibrotic interstitial opacity is unchanged. No new or focal airspace opacity. IMPRESSION: 1. Unchanged low volume AP portable examination. No new or focal airspace opacity. 2. Cardiomegaly. 3. Bibasilar predominant coarse fibrotic interstitial opacity, unchanged. Electronically Signed   By: Eddie Candle M.D.   On: 02/17/2021 09:58   DG Chest Portable 1 View  Result Date: 02/12/2021 CLINICAL DATA:  Level 2 trauma.  Fall. EXAM: PORTABLE CHEST 1 VIEW COMPARISON:  01/27/2021 FINDINGS: Postoperative changes in the mediastinum. Heart size and pulmonary vascularity are normal. Diffuse fine nodular interstitial pattern to the lungs is similar to prior study likely representing chronic interstitial lung disease. No developing consolidation, effusion, or pneumothorax. Degenerative changes in the spine and shoulders. IMPRESSION: Diffuse fine nodular interstitial pattern to the lungs is similar to prior study, likely chronic interstitial lung disease. Electronically Signed   By: Lucienne Capers M.D.   On: 02/12/2021 22:54   ECHOCARDIOGRAM COMPLETE  Result Date: 02/20/2021    ECHOCARDIOGRAM REPORT   Patient Name:   [name] Date of Exam: 02/20/2021 Medical Rec #:  [id number]       Height:       66.0 in Accession #:    [id number]  Weight:       171.3 lb Date of Birth:  11-18-1919       BSA:          1.872 m Patient Age:    60 years        BP:           106/63 mmHg Patient Gender: M               HR:           88 bpm. Exam Location:  Inpatient Procedure: 2D Echo, Color Doppler and Cardiac Doppler Indications:     Dyspnea R06.00  History:         Patient has no prior history of Echocardiogram examinations.   Sonographer:     Bernadene Person RDCS Referring Phys:  [id number] [place] Diagnosing Phys: Loralie Champagne MD IMPRESSIONS  1. Left ventricular ejection fraction, by estimation, is 40 to 45%. The left ventricle has mildly decreased function. The left ventricle demonstrates global hypokinesis. There is mild left ventricular hypertrophy. Left ventricular diastolic parameters are indeterminate.  2. Right ventricular systolic function is mildly reduced. The right ventricular size is mildly enlarged. There is moderately elevated pulmonary artery systolic pressure. The estimated right ventricular systolic pressure is 46.6 mmHg.  3. Left atrial size was moderately dilated.  4. Right atrial size was mildly dilated.  5. The mitral valve is normal in structure. Mild mitral valve regurgitation. No evidence of mitral stenosis.  6. Tricuspid valve regurgitation is mild to moderate.  7. The aortic valve is tricuspid. Aortic valve regurgitation is mild. Mild to moderate aortic valve sclerosis/calcification is present, without any evidence of aortic stenosis.  8. Aortic dilatation noted. There is mild dilatation of the ascending aorta, measuring 40 mm.  9. The inferior vena cava is dilated in size with <50% respiratory variability, suggesting right atrial pressure of 15 mmHg. 10. The patient was in atrial fibrillation. FINDINGS  Left Ventricle: Left ventricular ejection fraction, by estimation, is 40 to 45%. The left ventricle has mildly decreased function. The left ventricle demonstrates global hypokinesis. The left ventricular internal cavity size was normal in size. There is  mild left ventricular hypertrophy. Left ventricular diastolic parameters are indeterminate. Right Ventricle: The right ventricular size is mildly enlarged. No increase in right ventricular wall thickness. Right ventricular systolic function is mildly reduced. There is moderately elevated pulmonary artery systolic pressure. The tricuspid regurgitant  velocity is 3.15 m/s, and with an assumed right atrial pressure of 15 mmHg, the estimated right ventricular systolic pressure is 59.9 mmHg. Left Atrium: Left atrial size was moderately dilated. Right Atrium: Right atrial size was mildly dilated. Pericardium: There is no evidence of pericardial effusion. Mitral Valve: The mitral valve is normal in structure. There is mild calcification of the mitral valve leaflet(s). Mild mitral annular calcification. Mild mitral valve regurgitation. No evidence of mitral valve stenosis. Tricuspid Valve: The tricuspid valve is normal in structure. Tricuspid valve regurgitation is mild to moderate. Aortic Valve: The aortic valve is tricuspid. Aortic valve regurgitation is mild. Aortic regurgitation PHT measures 590 msec. Mild to moderate aortic valve sclerosis/calcification is present, without any evidence of aortic stenosis. Pulmonic Valve: The pulmonic valve was normal in structure. Pulmonic valve regurgitation is not visualized. Aorta: The aortic root is normal in size and structure and aortic dilatation noted. There is mild dilatation of the ascending aorta, measuring 40 mm. Venous: The inferior vena cava is dilated in size with less than 50% respiratory variability, suggesting  right atrial pressure of 15 mmHg. IAS/Shunts: No atrial level shunt detected by color flow Doppler.  LEFT VENTRICLE PLAX 2D LVIDd:         5.10 cm LVIDs:         3.60 cm LV PW:         1.10 cm LV IVS:        1.10 cm LVOT diam:     1.80 cm LV SV:         37 LV SV Index:   20 LVOT Area:     2.54 cm  LV Volumes (MOD) LV vol d, MOD A2C: 121.0 ml LV vol d, MOD A4C: 83.1 ml LV vol s, MOD A2C: 60.5 ml LV vol s, MOD A4C: 39.7 ml LV SV MOD A2C:     60.5 ml LV SV MOD A4C:     83.1 ml LV SV MOD BP:      54.8 ml RIGHT VENTRICLE RV S prime:     7.20 cm/s TAPSE (M-mode): 1.0 cm LEFT ATRIUM             Index       RIGHT ATRIUM           Index LA diam:        5.00 cm 2.67 cm/m  RA Area:     19.00 cm LA Vol (A2C):   71.8  ml 38.35 ml/m RA Volume:   50.70 ml  27.08 ml/m LA Vol (A4C):   60.6 ml 32.37 ml/m LA Biplane Vol: 74.8 ml 39.95 ml/m  AORTIC VALVE LVOT Vmax:   88.25 cm/s LVOT Vmean:  55.750 cm/s LVOT VTI:    0.144 m AI PHT:      590 msec  AORTA Ao Root diam: 3.50 cm Ao Asc diam:  4.00 cm MITRAL VALVE               TRICUSPID VALVE MV Area (PHT): 5.27 cm    TR Peak grad:   39.7 mmHg MV Decel Time: 144 msec    TR Vmax:        315.00 cm/s MR PISA:        0.25 cm MR PISA Radius: 0.20 cm    SHUNTS MV E velocity: 73.70 cm/s  Systemic VTI:  0.14 m MV A velocity: 49.90 cm/s  Systemic Diam: 1.80 cm MV E/A ratio:  1.48 Loralie Champagne MD Electronically signed by Loralie Champagne MD Signature Date/Time: 02/20/2021/5:26:07 PM    Final (Updated)        Subjective: Patient is feeling better, his dyspnea is stable and along with his lower extremity edema, no nausea or vomiting.   Discharge Exam: Vitals:   02/24/21 2014 02/25/21 0436  BP: 103/64 110/63  Pulse: (!) 101 96  Resp: 17 17  Temp: 98.2 F (36.8 C) (!) 97.5 F (36.4 C)  SpO2: 99% 96%   Vitals:   02/24/21 1137 02/24/21 2014 02/25/21 0433 02/25/21 0436  BP: 111/72 103/64  110/63  Pulse: 92 (!) 101  96  Resp: 18 17  17   Temp: 97.9 F (36.6 C) 98.2 F (36.8 C)  (!) 97.5 F (36.4 C)  TempSrc: Oral Oral  Oral  SpO2: 97% 99%  96%  Weight:   68.7 kg   Height:        General: Not in pain or dyspnea.  Neurology: Awake and alert, non focal  E ENT: positive pallor, no icterus, oral mucosa moist Cardiovascular: moderate JVD. S1-S2 present, rhythmic, no gallops,  rubs, or murmurs. ++ thighs,  lower extremity with unna boots.  Pulmonary: positive breath sounds bilaterally, adequate air movement, no wheezing, rhonchi, positive bibasilar rales. Gastrointestinal. Abdomen soft and non tender Skin. Large ecchymosis at the left hip.  Musculoskeletal: no joint deformities   The results of significant diagnostics from this hospitalization (including imaging,  microbiology, ancillary and laboratory) are listed below for reference.     Microbiology: No results found for this or any previous visit (from the past 240 hour(s)).   Labs: BNP (last 3 results) No results for input(s): BNP in the last 8760 hours. Basic Metabolic Panel: Recent Labs  Lab 02/21/21 0309 02/22/21 0335 02/23/21 0203 02/24/21 0252 02/25/21 0211  NA 130* 133* 131* 135 137  K 3.2* 3.6 3.1* 2.6* 3.1*  CL 91* 91* 87* 89* 90*  CO2 32 31 38* 37* 38*  GLUCOSE 123* 108* 107* 98 107*  BUN 66* 72* 73* 76* 73*  CREATININE 1.98* 2.03* 2.13* 2.28* 2.05*  CALCIUM 8.2* 8.4* 8.3* 8.4* 8.5*  MG 2.2 2.1  --  2.1  --    Liver Function Tests: No results for input(s): AST, ALT, ALKPHOS, BILITOT, PROT, ALBUMIN in the last 168 hours. No results for input(s): LIPASE, AMYLASE in the last 168 hours. No results for input(s): AMMONIA in the last 168 hours. CBC: Recent Labs  Lab 02/19/21 0433 02/20/21 0302 02/24/21 0252  WBC 9.3  --   --   HGB 8.6* 8.9* 8.8*  HCT 26.0* 26.9* 28.0*  MCV 93.9  --   --   PLT 160  --   --    Cardiac Enzymes: No results for input(s): CKTOTAL, CKMB, CKMBINDEX, TROPONINI in the last 168 hours. BNP: Invalid input(s): POCBNP CBG: No results for input(s): GLUCAP in the last 168 hours. D-Dimer No results for input(s): DDIMER in the last 72 hours. Hgb A1c No results for input(s): HGBA1C in the last 72 hours. Lipid Profile No results for input(s): CHOL, HDL, LDLCALC, TRIG, CHOLHDL, LDLDIRECT in the last 72 hours. Thyroid function studies No results for input(s): TSH, T4TOTAL, T3FREE, THYROIDAB in the last 72 hours.  Invalid input(s): FREET3 Anemia work up No results for input(s): VITAMINB12, FOLATE, FERRITIN, TIBC, IRON, RETICCTPCT in the last 72 hours. Urinalysis No results found for: COLORURINE, APPEARANCEUR, LABSPEC, [place], GLUCOSEU, HGBUR, BILIRUBINUR, KETONESUR, PROTEINUR, UROBILINOGEN, NITRITE, LEUKOCYTESUR Sepsis Labs Invalid input(s):  PROCALCITONIN,  WBC,  LACTICIDVEN Microbiology No results found for this or any previous visit (from the past 240 hour(s)).   Time coordinating discharge: 45 minutes  SIGNED:   Tawni Millers, MD  [facility] 02/25/2021, 9:08 AM

## 2021-02-25 NOTE — Care Management Important Message (Signed)
Important Message  Patient Details  Name: [name] MRN: [id number] Date of Birth: 1920-02-11   Medicare Important Message Given:  Yes     Shelda Altes 02/25/2021, 8:36 AM

## 2021-02-25 NOTE — Telephone Encounter (Signed)
Refill request received from [facility] for Oxycodone 5 mg tablet one tablet every 4 hours as needed for severe pain, but medication not on medication list. Please place order and send to pharmacy if refill adequate. Message routed to [name] [name], N{P

## 2021-02-25 NOTE — Progress Notes (Signed)
Physical Therapy Treatment Patient Details Name: [name] MRN: [id number] DOB: October 19, 1919 Today's Date: 02/25/2021    History of Present Illness Pt is a 87 y.o. male admitted from ALF on 02/12/21 after a fall (pt's third fall in 3 months). Pt sustained L superior and inferior pubic rami fxs; subacute mild T1-T2 compression fxs. PMH includes HTN, afib on Coumadin, CHF, anxiety, depression.   PT Comments    Pt remains limited by significant fatigue, as well as decreased activity tolerance, diffuse muscle weakness and c/o R knee pain. Pt requires maxA for stand pivot transfer, requiring max-totalA to maintain static standing. Pt preparing for d/c today; question pt's ability to tolerate SNF-level therapies. If to remain admitted, will continue to follow acutely.  Pt requires 2L O2 to maintain SpO2 >/91%   Follow Up Recommendations  SNF;Supervision for mobility/OOB (return to ALF with Hospice?)     Equipment Recommendations  Wheelchair;Wheelchair cushion;Hospital bed;Mechanical lift   Recommendations for Other Services       Precautions / Restrictions Precautions Precautions: Fall;Other (comment) Precaution Comments: Nose bleed Restrictions Weight Bearing Restrictions: Yes RLE Weight Bearing: Weight bearing as tolerated LLE Weight Bearing: Weight bearing as tolerated    Mobility  Bed Mobility Overal bed mobility: Needs Assistance Bed Mobility: Sit to Supine       Sit to supine: Mod assist   General bed mobility comments: ModA for BLE management with return to supine; assist to scoot up in bed and reposition hips    Transfers Overall transfer level: Needs assistance Equipment used: 1 person hand held assist Transfers: Stand Pivot Transfers   Stand pivot transfers: Max assist       General transfer comment: Pt unable to perform initial standing trial with RW, reports too fatigued; performed stand pivot transfer from recliner to bed with maxA; further transfer  training limited by fatigue  Ambulation/Gait             General Gait Details: Unable this session   Stairs             Wheelchair Mobility    Modified Rankin (Stroke Patients Only)       Balance Overall balance assessment: Needs assistance Sitting-balance support: Feet supported Sitting balance-Leahy Scale: Poor Sitting balance - Comments: reliant on UE support   Standing balance support: Bilateral upper extremity supported;During functional activity Standing balance-Leahy Scale: Zero Standing balance comment: Requiring maxA to maintain static standing this session                            Cognition Arousal/Alertness: Awake/alert Behavior During Therapy: WFL for tasks assessed/performed;Flat affect Overall Cognitive Status: Within Functional Limits for tasks assessed                                 General Comments: Following commands and answering questions appropriately; appears fatigued this session      Exercises General Exercises - Lower Extremity Ankle Circles/Pumps: AROM;Both;Seated Long Arc Quad: AROM;Both;Seated Hip Flexion/Marching: AROM;Both;Seated Toe Raises: AROM;Both;Seated Heel Raises: AROM;Both;Seated    General Comments General comments (skin integrity, edema, etc.): Pt with significant nose bleed (suspect related to O2 [place]), RN present and aware. SpO2 down to 81% on RA, up to >/91% when 2L O2 replaced. Pt reports he will be going to rehab/SNF-side of [facility]      Pertinent Vitals/Pain Pain Assessment: Faces Pain Score:  (Pt denies pain  however made facial grimmace during grooming tasks and bending right elbow when brushing teeth) Faces Pain Scale: Hurts a little bit Pain Location: R anterior knee Pain Descriptors / Indicators: Sore Pain Intervention(s): Limited activity within patient's tolerance;Monitored during session    Home Living                      Prior Function            PT  Goals (current goals can now be found in the care plan section) Acute Rehab PT Goals Patient Stated Goal: "Put me to sleep" PT Goal Formulation: With patient Time For Goal Achievement: 03/11/21 Potential to Achieve Goals: Fair Progress towards PT goals: Not progressing toward goals - comment (limited by fatigue, pain)    Frequency    Min 3X/week      PT Plan Current plan remains appropriate    Co-evaluation              AM-PAC PT "6 Clicks" Mobility   Outcome Measure  Help needed turning from your back to your side while in a flat bed without using bedrails?: A Lot Help needed moving from lying on your back to sitting on the side of a flat bed without using bedrails?: A Lot Help needed moving to and from a bed to a chair (including a wheelchair)?: A Lot Help needed standing up from a chair using your arms (e.g., wheelchair or bedside chair)?: A Lot Help needed to walk in hospital room?: Total Help needed climbing 3-5 steps with a railing? : Total 6 Click Score: 10    End of Session Equipment Utilized During Treatment: Gait belt Activity Tolerance: Patient limited by fatigue Patient left: in bed;with call bell/phone within reach;with bed alarm set Nurse Communication: Mobility status PT Visit Diagnosis: Unsteadiness on feet (R26.81);Muscle weakness (generalized) (M62.81)     Time: 3244-0102 PT Time Calculation (min) (ACUTE ONLY): 28 min  Charges:  $Therapeutic Exercise: 8-22 mins $Therapeutic Activity: 8-22 mins                     Mabeline Caras, PT, DPT Acute Rehabilitation Services  Pager [phone] Office [phone]  Derry Lory 02/25/2021, 12:33 PM

## 2021-02-25 NOTE — Telephone Encounter (Addendum)
Medication refill request received from [facility] for Diazepam 5 mg tablet take 1/2 (2.5 mg) every 6 hours for anxiety.  Medication pended and sent to [name] [name],NP for approval

## 2021-02-25 NOTE — Therapy (Signed)
Occupational Therapy Treatment Patient Details Name: [name] MRN: [id number] DOB: [birth year] Today's Date: 02/25/2021    History of present illness This 87 y.o. male admitted after a fall at ALF (third fall in 3 months).  He sustained acute displaced fractures involving Lt superior and inferior pubic rami and acute subacute mild compression fractures T1 and T2.  Ortho consulted and pt ok for WBAT. PMH includes: hypothyroidism, chronic anxiety/depression, hypertension, permanent Afib on Coumadin,  chronic diastolic CHF   OT comments  Pt seen for OT ADL retraining session today with focus on sitting up in recliner for grooming and upper body bathing and dressing. Overall pt is Min to set-up assist to complete. Pt elected to continue sitting up in recliner at end of treatment session today. Call bell and phone were in reach. He plans to d/c to SNF per his report   Follow Up Recommendations  SNF;Supervision/Assistance - 24 hour    Equipment Recommendations  Other (comment) (Defer to next venue)    Recommendations for Other Services      Precautions / Restrictions Precautions Precautions: Fall;Other (comment) (right elbow injury) Precaution Comments: h/o falls Restrictions Weight Bearing Restrictions: No RLE Weight Bearing: Weight bearing as tolerated          Balance   Sitting-balance support: Feet supported Sitting balance-Leahy Scale: Fair           ADL either performed or assessed with clinical judgement   ADL Overall ADL's : Needs assistance/impaired Eating/Feeding: Set up;Sitting   Grooming: Wash/dry hands;Wash/dry face;Oral care;Brushing hair;Set up;Sitting   Upper Body Bathing: Set up;Sitting;Minimal assistance (Sitting up in recliner)       Upper Body Dressing : Minimal assistance;Sitting;Set up          Functional mobility during ADLs:  (Pt sitting up in recliner during grooming and UB bathe/dressing today)      Cognition Arousal/Alertness:  Awake/alert Behavior During Therapy: WFL for tasks assessed/performed Overall Cognitive Status: Within Functional Limits for tasks assessed                     General Comments Pt with bruising on head, elbow and nose noted    Pertinent Vitals/ Pain       Pain Assessment: Faces Pain Score:  (Pt denies pain however made facial grimmace during grooming tasks and bending right elbow when brushing teeth) Faces Pain Scale: Hurts a little bit Pain Location: R elbow Pain Descriptors / Indicators: Grimacing;Guarding Pain Intervention(s): Limited activity within patient's tolerance;Monitored during session;Repositioned   Frequency  Min 2X/week        Progress Toward Goals  OT Goals(current goals can now be found in the care plan section)  Progress towards OT goals: Progressing toward goals  Acute Rehab OT Goals Patient Stated Goal: get better and return to [place] Discharge plan remains appropriate;Frequency remains appropriate    AM-PAC OT "6 Clicks" Daily Activity     Outcome Measure   Help from another person eating meals?: A Little Help from another person taking care of personal grooming?: A Little Help from another person toileting, which includes using toliet, bedpan, or urinal?: Total Help from another person bathing (including washing, rinsing, drying)?: A Lot Help from another person to put on and taking off regular upper body clothing?: A Little Help from another person to put on and taking off regular lower body clothing?: Total 6 Click Score: 13    End of Session Equipment Utilized During Treatment: Oxygen  OT  Visit Diagnosis: Unsteadiness on feet (R26.81);Pain Pain - Right/Left: Right Pain - part of body: Arm (Elbow, facial grimmace and guarding)   Activity Tolerance Patient tolerated treatment well   Patient Left in chair;with call bell/phone within reach;with chair alarm set   Nurse Communication Other (comment) (ADL's and grooming  performed sitting up in chair)        Time: 0220-2669 OT Time Calculation (min): 25 min  Charges: OT General Charges $OT Visit: 1 Visit OT Treatments $Self Care/Home Management : 23-37 mins   Skylan Gift Beth Dixon, OTR/L 02/25/2021, 10:26 AM

## 2021-02-26 ENCOUNTER — Non-Acute Institutional Stay (SKILLED_NURSING_FACILITY): Payer: Medicare Other | Admitting: Orthopedic Surgery

## 2021-02-26 ENCOUNTER — Encounter: Payer: Self-pay | Admitting: Orthopedic Surgery

## 2021-02-26 ENCOUNTER — Encounter: Payer: Self-pay | Admitting: Nurse Practitioner

## 2021-02-26 DIAGNOSIS — S7002XD Contusion of left hip, subsequent encounter: Secondary | ICD-10-CM | POA: Diagnosis not present

## 2021-02-26 DIAGNOSIS — I480 Paroxysmal atrial fibrillation: Secondary | ICD-10-CM | POA: Diagnosis not present

## 2021-02-26 DIAGNOSIS — E039 Hypothyroidism, unspecified: Secondary | ICD-10-CM | POA: Diagnosis not present

## 2021-02-26 DIAGNOSIS — F411 Generalized anxiety disorder: Secondary | ICD-10-CM

## 2021-02-26 DIAGNOSIS — S32592G Other specified fracture of left pubis, subsequent encounter for fracture with delayed healing: Secondary | ICD-10-CM

## 2021-02-26 DIAGNOSIS — I5032 Chronic diastolic (congestive) heart failure: Secondary | ICD-10-CM | POA: Diagnosis not present

## 2021-02-26 DIAGNOSIS — N184 Chronic kidney disease, stage 4 (severe): Secondary | ICD-10-CM

## 2021-02-26 NOTE — Telephone Encounter (Signed)
Below are ManXie's sent via routing comments:  1st response:   [name], [name], NP  Fargo, Amy E, NP; Dillard, Jasmine E, CMA 1 hour ago (9:57 AM)     Thanks.     2nd Response:   [name], [name], NP  You; Fargo, Amy E, NP 45 minutes ago (10:19 AM)     Thanks.     3rd Response: [name], [name], NP  You; [place], Amy E, NP Just now (11:04 AM)     Thanks.     Each response was sent back to the persons listed in the routing comments, yet RX was never approved. RX was approved x 10 pills yesterday as patient was discharged from the hospital.  Lennie Odor is on site at the facility where patient lives.

## 2021-02-26 NOTE — Telephone Encounter (Signed)
[name], we have received your response x 2 stating Thanks, yet this encounter requires action.  A medication is pending and we need you to approve or deny the medication. The medication is a controlled substance and it has to be approved by a provider

## 2021-02-26 NOTE — Telephone Encounter (Signed)
Hello ManXie there is a medication pending that needs your approval

## 2021-02-26 NOTE — Telephone Encounter (Signed)
[name], [name], NP  You; Fargo, Amy E, NP 57 minutes ago (9:54 AM)   Thanks.        Dr. Riccardo Dubin Arrien Prescribed medication.  Pended Rx and sent to Amy for approval.
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
## 2021-02-27 ENCOUNTER — Non-Acute Institutional Stay (SKILLED_NURSING_FACILITY): Payer: Medicare Other | Admitting: Internal Medicine

## 2021-02-27 ENCOUNTER — Encounter: Payer: Self-pay | Admitting: Internal Medicine

## 2021-02-27 DIAGNOSIS — N289 Disorder of kidney and ureter, unspecified: Secondary | ICD-10-CM

## 2021-02-27 DIAGNOSIS — J9621 Acute and chronic respiratory failure with hypoxia: Secondary | ICD-10-CM | POA: Diagnosis not present

## 2021-02-27 DIAGNOSIS — J302 Other seasonal allergic rhinitis: Secondary | ICD-10-CM | POA: Diagnosis not present

## 2021-02-27 DIAGNOSIS — N1832 Chronic kidney disease, stage 3b: Secondary | ICD-10-CM | POA: Diagnosis not present

## 2021-02-27 DIAGNOSIS — N184 Chronic kidney disease, stage 4 (severe): Secondary | ICD-10-CM

## 2021-02-27 DIAGNOSIS — E785 Hyperlipidemia, unspecified: Secondary | ICD-10-CM | POA: Diagnosis not present

## 2021-02-27 DIAGNOSIS — S7002XD Contusion of left hip, subsequent encounter: Secondary | ICD-10-CM

## 2021-02-27 DIAGNOSIS — S329XXD Fracture of unspecified parts of lumbosacral spine and pelvis, subsequent encounter for fracture with routine healing: Secondary | ICD-10-CM | POA: Diagnosis not present

## 2021-02-27 DIAGNOSIS — D631 Anemia in chronic kidney disease: Secondary | ICD-10-CM | POA: Diagnosis not present

## 2021-02-27 DIAGNOSIS — I5043 Acute on chronic combined systolic (congestive) and diastolic (congestive) heart failure: Secondary | ICD-10-CM

## 2021-02-27 DIAGNOSIS — E039 Hypothyroidism, unspecified: Secondary | ICD-10-CM | POA: Diagnosis not present

## 2021-02-27 DIAGNOSIS — I480 Paroxysmal atrial fibrillation: Secondary | ICD-10-CM

## 2021-02-27 DIAGNOSIS — S32592G Other specified fracture of left pubis, subsequent encounter for fracture with delayed healing: Secondary | ICD-10-CM | POA: Diagnosis not present

## 2021-02-27 DIAGNOSIS — I130 Hypertensive heart and chronic kidney disease with heart failure and stage 1 through stage 4 chronic kidney disease, or unspecified chronic kidney disease: Secondary | ICD-10-CM | POA: Diagnosis not present

## 2021-02-27 DIAGNOSIS — I272 Pulmonary hypertension, unspecified: Secondary | ICD-10-CM | POA: Diagnosis not present

## 2021-02-27 DIAGNOSIS — I482 Chronic atrial fibrillation, unspecified: Secondary | ICD-10-CM | POA: Diagnosis not present

## 2021-02-27 DIAGNOSIS — I5020 Unspecified systolic (congestive) heart failure: Secondary | ICD-10-CM | POA: Diagnosis not present

## 2021-02-27 DIAGNOSIS — I2781 Cor pulmonale (chronic): Secondary | ICD-10-CM | POA: Diagnosis not present

## 2021-02-27 DIAGNOSIS — N179 Acute kidney failure, unspecified: Secondary | ICD-10-CM | POA: Diagnosis not present

## 2021-02-27 DIAGNOSIS — I959 Hypotension, unspecified: Secondary | ICD-10-CM | POA: Diagnosis not present

## 2021-02-27 MED ORDER — MORPHINE SULFATE (CONCENTRATE) 20 MG/ML PO SOLN: 5.0000 mg | ORAL | 0 refills | Status: AC | PRN

## 2021-02-27 MED ORDER — LORAZEPAM 1 MG PO TABS: 1.0000 mg | ORAL_TABLET | ORAL | 1 refills | Status: AC | PRN
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
## 2021-02-28 DIAGNOSIS — N1832 Chronic kidney disease, stage 3b: Secondary | ICD-10-CM | POA: Diagnosis not present

## 2021-02-28 DIAGNOSIS — J9621 Acute and chronic respiratory failure with hypoxia: Secondary | ICD-10-CM | POA: Diagnosis not present

## 2021-02-28 DIAGNOSIS — I130 Hypertensive heart and chronic kidney disease with heart failure and stage 1 through stage 4 chronic kidney disease, or unspecified chronic kidney disease: Secondary | ICD-10-CM | POA: Diagnosis not present

## 2021-02-28 DIAGNOSIS — D631 Anemia in chronic kidney disease: Secondary | ICD-10-CM | POA: Diagnosis not present

## 2021-02-28 DIAGNOSIS — N179 Acute kidney failure, unspecified: Secondary | ICD-10-CM | POA: Diagnosis not present

## 2021-02-28 DIAGNOSIS — I5020 Unspecified systolic (congestive) heart failure: Secondary | ICD-10-CM | POA: Diagnosis not present

## 2021-03-01 DIAGNOSIS — D631 Anemia in chronic kidney disease: Secondary | ICD-10-CM | POA: Diagnosis not present

## 2021-03-01 DIAGNOSIS — I5020 Unspecified systolic (congestive) heart failure: Secondary | ICD-10-CM | POA: Diagnosis not present

## 2021-03-01 DIAGNOSIS — N1832 Chronic kidney disease, stage 3b: Secondary | ICD-10-CM | POA: Diagnosis not present

## 2021-03-01 DIAGNOSIS — I130 Hypertensive heart and chronic kidney disease with heart failure and stage 1 through stage 4 chronic kidney disease, or unspecified chronic kidney disease: Secondary | ICD-10-CM | POA: Diagnosis not present

## 2021-03-01 DIAGNOSIS — N179 Acute kidney failure, unspecified: Secondary | ICD-10-CM | POA: Diagnosis not present

## 2021-03-01 DIAGNOSIS — J9621 Acute and chronic respiratory failure with hypoxia: Secondary | ICD-10-CM | POA: Diagnosis not present

## 2021-03-03 DIAGNOSIS — I130 Hypertensive heart and chronic kidney disease with heart failure and stage 1 through stage 4 chronic kidney disease, or unspecified chronic kidney disease: Secondary | ICD-10-CM | POA: Diagnosis not present

## 2021-03-03 DIAGNOSIS — D631 Anemia in chronic kidney disease: Secondary | ICD-10-CM | POA: Diagnosis not present

## 2021-03-03 DIAGNOSIS — N1832 Chronic kidney disease, stage 3b: Secondary | ICD-10-CM | POA: Diagnosis not present

## 2021-03-03 DIAGNOSIS — J9621 Acute and chronic respiratory failure with hypoxia: Secondary | ICD-10-CM | POA: Diagnosis not present

## 2021-03-03 DIAGNOSIS — I5020 Unspecified systolic (congestive) heart failure: Secondary | ICD-10-CM | POA: Diagnosis not present

## 2021-03-03 DIAGNOSIS — N179 Acute kidney failure, unspecified: Secondary | ICD-10-CM | POA: Diagnosis not present

## 2021-03-10 DEATH — deceased

## 2021-09-09 ENCOUNTER — Ambulatory Visit: Payer: Medicare Other | Admitting: Nurse Practitioner

## 2022-03-06 IMAGING — DX DG CHEST 1V PORT
1 series · 1 of 1 positions shown · non-contrast
Comparison: Chest radiograph dated 04/17/2020.

CLINICAL DATA: [AGE] male with fall.

EXAM:
PORTABLE CHEST 1 VIEW

[chest ap]
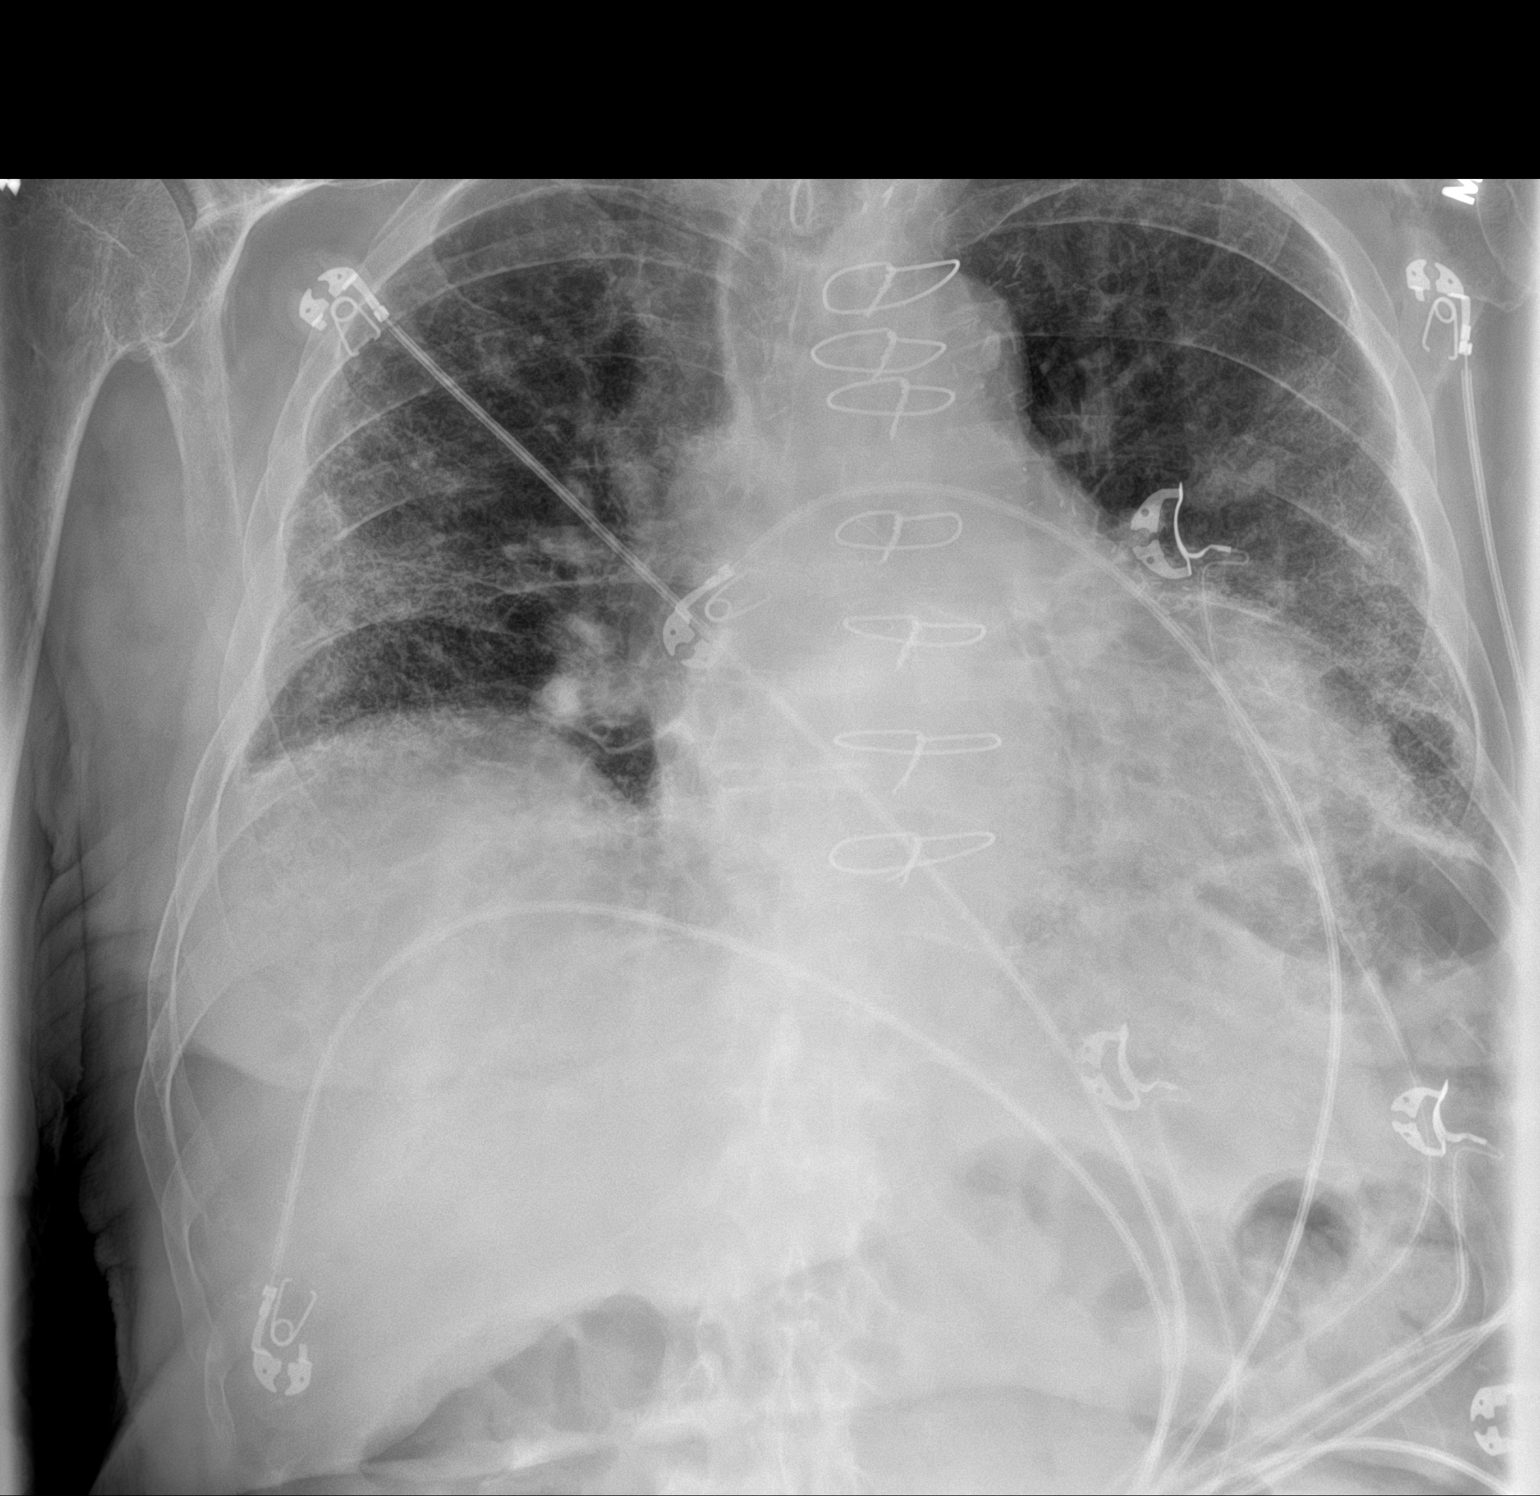

[1 of 1 positions shown; findings below may reference images not displayed]

FINDINGS: Shallow inspiration. Bilateral interstitial coarsening and hazy
airspace densities primarily involving the mid to lower lung field
and subpleural region of the lungs, progressed since the prior
radiograph and may represent progression of underlying fibrosis or
sequela of prior inflammatory/infectious process including prior
[id number] infection. Clinical correlation is recommended. There is no
pleural effusion pneumothorax. Stable cardiac silhouette. Median
sternotomy wires and CABG vascular clips. No acute osseous
pathology.
IMPRESSION: Progression of bilateral pulmonary densities, likely worsening
fibrosis or sequela of prior inflammatory/infectious process.

## 2022-03-06 IMAGING — CT CT HEAD W/O CM
4 series · 16 of 47 positions shown, 18 images · non-contrast
Comparison: None.

CLINICAL DATA: [AGE] male with head trauma.

EXAM:
CT HEAD WITHOUT CONTRAST
CT CERVICAL SPINE WITHOUT CONTRAST
TECHNIQUE: Multidetector CT imaging of the head and cervical spine was
performed following the standard protocol without intravenous
contrast. Multiplanar CT image reconstructions of the cervical spine
were also generated.

[Series 3: head wo · axial · 0.46mm/px · z∈[+1042,+1167]mm · 7 of 35 slices shown, 9 images]
[im 5/35  brain]
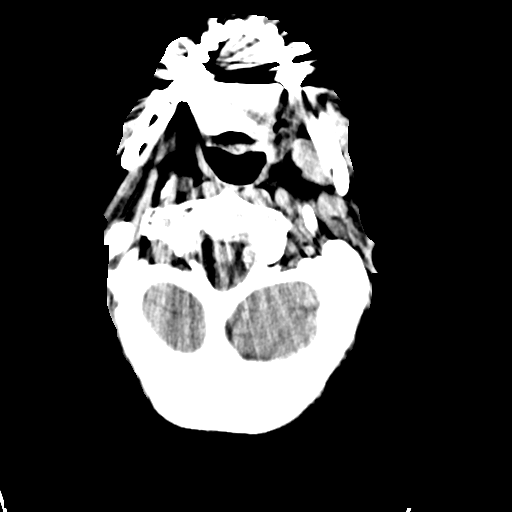
[im 5/35  bone]
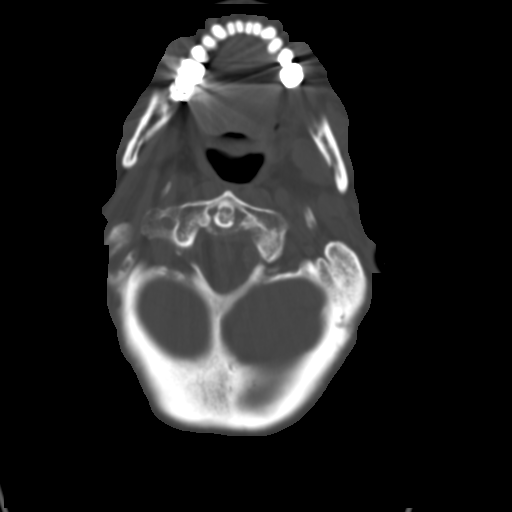
[im 9/35  brain]
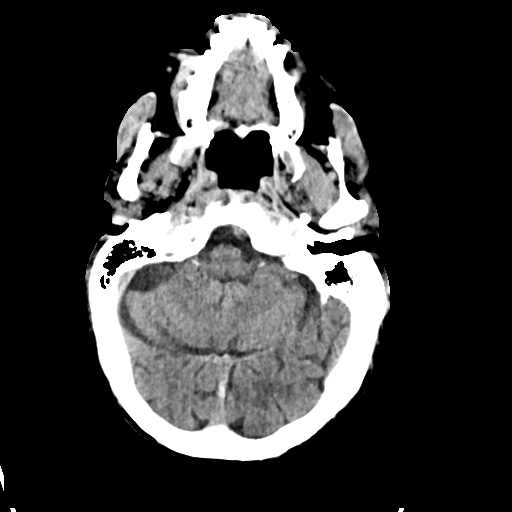
[im 13/35  brain]
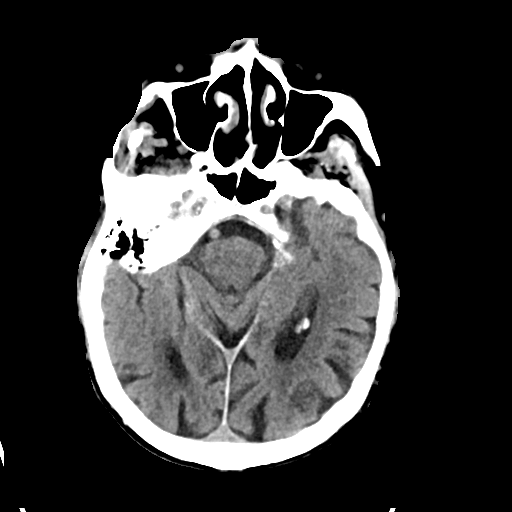
[im 18/35  brain]
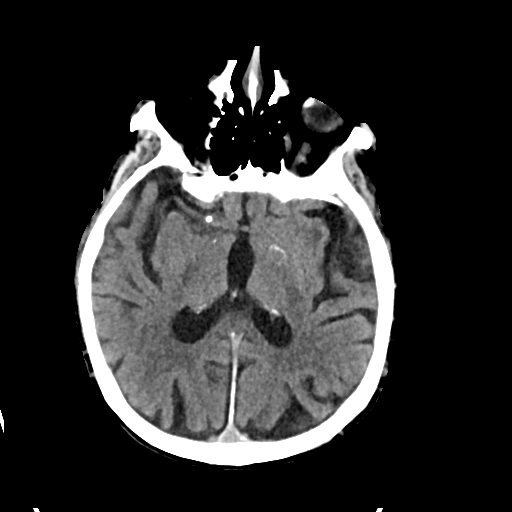
[im 22/35  brain]
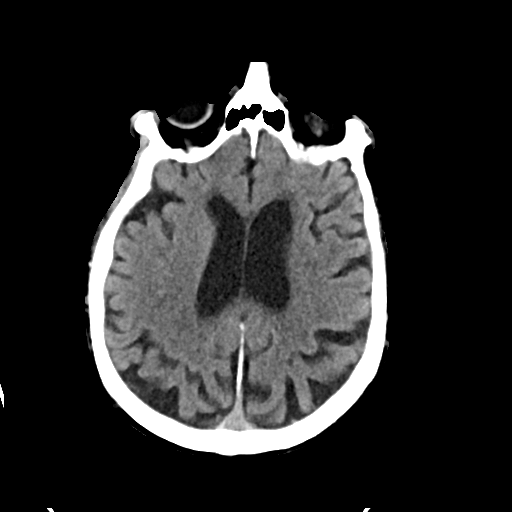
[im 22/35  bone]
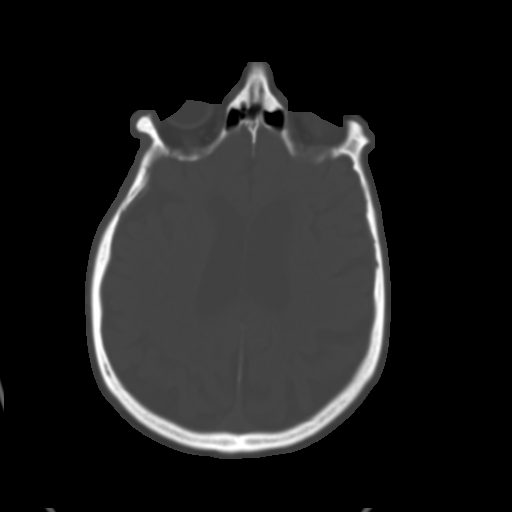
[im 26/35  brain]
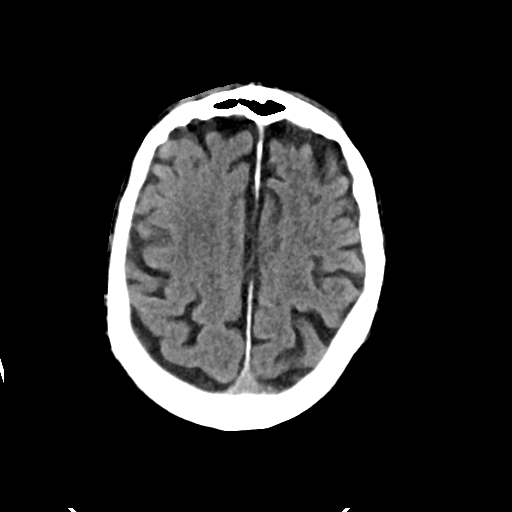
[im 30/35  brain]
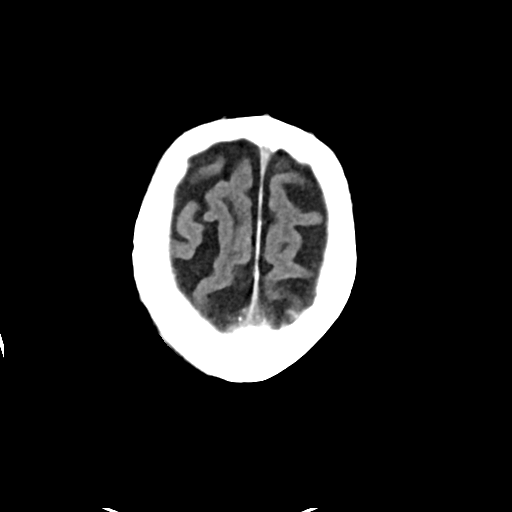

[Series 4: head bone · axial · 0.46mm/px · z∈[+1038,+1072]mm · 3 of 87 slices shown]
[im 9/87  bone]
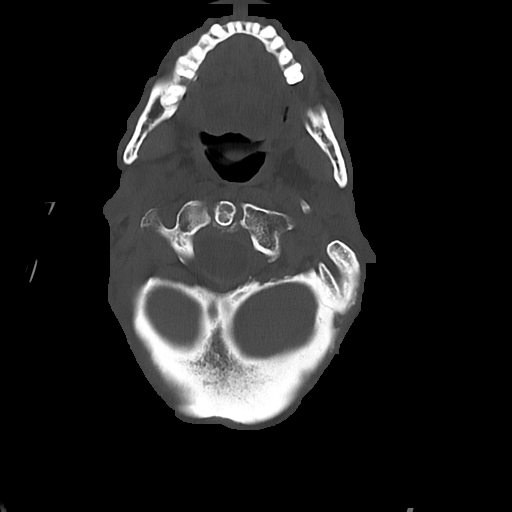
[im 18/87  bone]
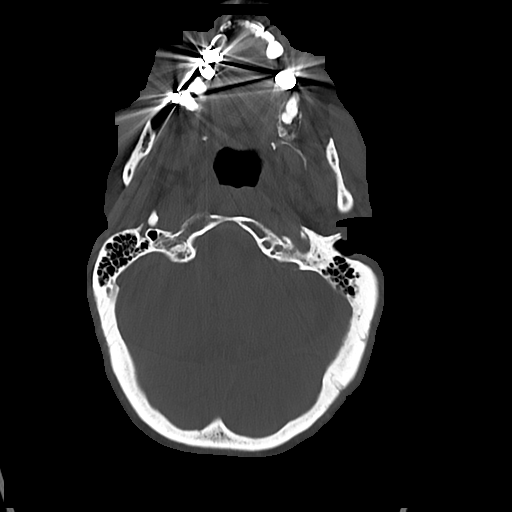
[im 26/87  bone]
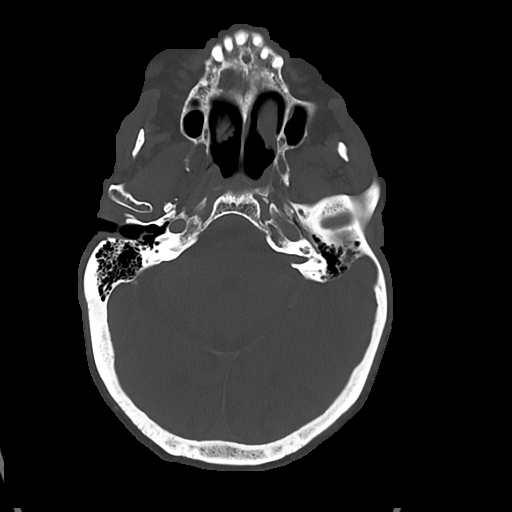

[Series 5: cor soft · coronal · 0.36mm/px · 3 of 79 slices shown]
[im 32/79  brain]
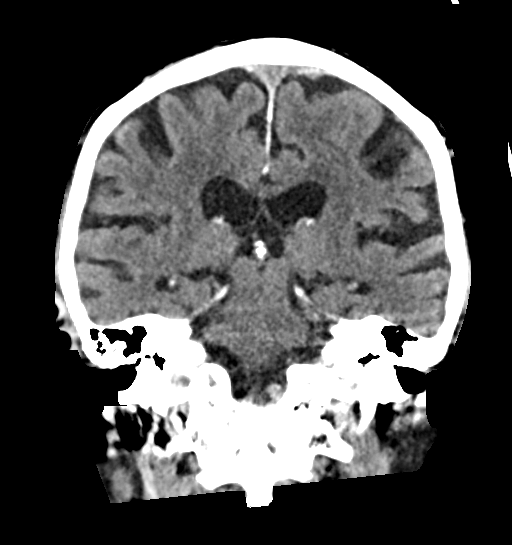
[im 37/79  brain]
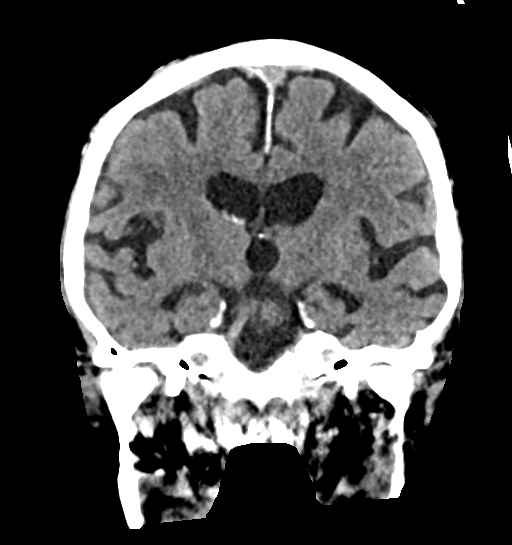
[im 43/79  brain]
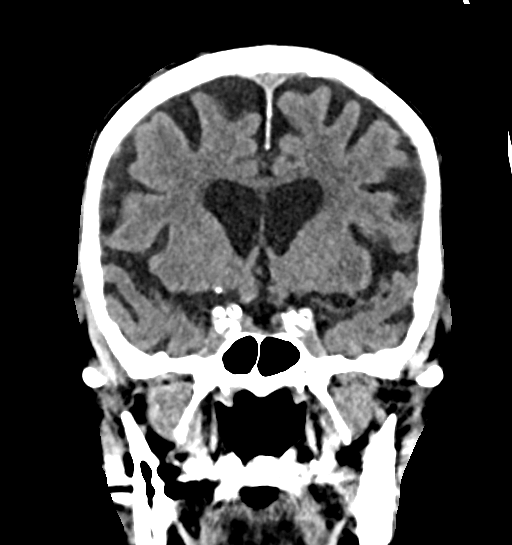

[Series 6: sag soft · sagittal · 0.41mm/px · 3 of 62 slices shown]
[im 21/62  brain]
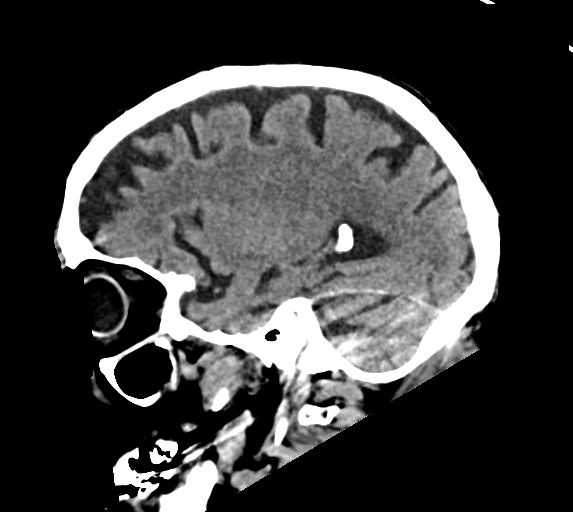
[im 31/62  brain]
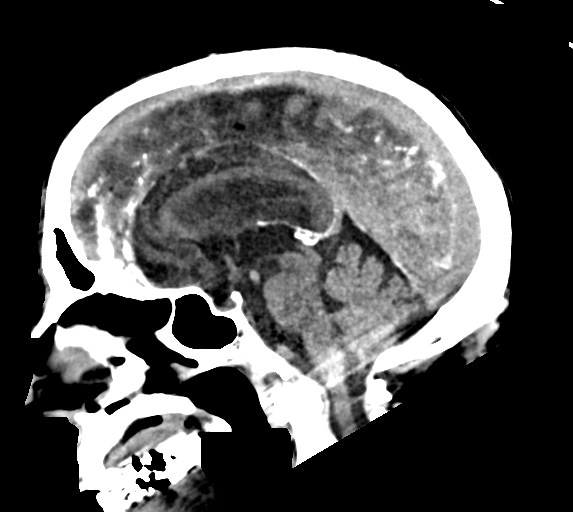
[im 41/62  brain]
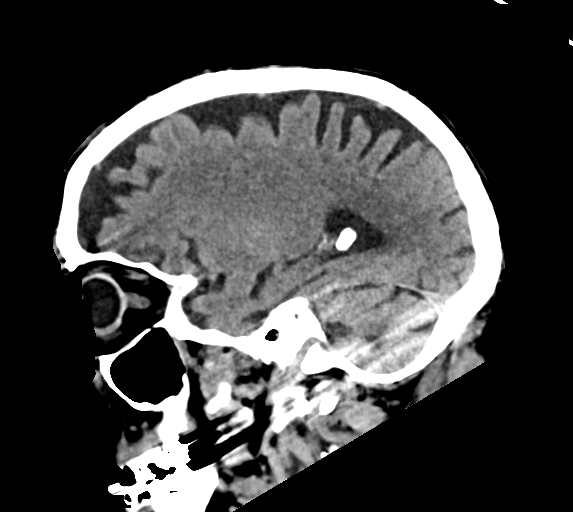

[16 of 47 positions shown; findings below may reference images not displayed]

FINDINGS: CT HEAD FINDINGS

Brain: Mild age-related atrophy and chronic microvascular ischemic
changes. There is no acute intracranial hemorrhage. No mass effect
or midline shift. No extra-axial fluid collection.

Vascular: No hyperdense vessel or unexpected calcification.

Skull: Normal. Negative for fracture or focal lesion.

Sinuses/Orbits: No acute finding.

Other: Right posterior parietal scalp laceration. No large hematoma.

CT CERVICAL SPINE FINDINGS

Alignment: No acute subluxation.

Skull base and vertebrae: No acute fracture. Osteopenia.

Soft tissues and spinal canal: No prevertebral fluid or swelling. No
visible canal hematoma.

Disc levels: Multilevel degenerative changes with disc space
narrowing and endplate irregularity.

Upper chest: Partially visualized bilateral pleural effusions. There
is diffuse interstitial and interlobular thickening, likely edema.
Clinical correlation is recommended.

Other: Bilateral carotid bulb calcified plaques.
IMPRESSION: 1. No acute intracranial pathology. Age-related atrophy and chronic
microvascular ischemic changes.
2. No acute/traumatic cervical spine pathology. Multilevel
degenerative changes.
3. Partially visualized bilateral pleural effusions and findings of
pulmonary edema. Clinical correlation is recommended.
